# Patient Record
Sex: Female | Born: 1955
Health system: Southern US, Community
[De-identification: ages and names within clinical notes are randomized; demographics above are authoritative.]

## PROBLEM LIST (undated history)

## (undated) DIAGNOSIS — E7800 Pure hypercholesterolemia, unspecified: Secondary | ICD-10-CM

## (undated) DIAGNOSIS — M797 Fibromyalgia: Secondary | ICD-10-CM

## (undated) DIAGNOSIS — I2699 Other pulmonary embolism without acute cor pulmonale: Secondary | ICD-10-CM

## (undated) DIAGNOSIS — K219 Gastro-esophageal reflux disease without esophagitis: Secondary | ICD-10-CM

## (undated) DIAGNOSIS — M1990 Unspecified osteoarthritis, unspecified site: Secondary | ICD-10-CM

## (undated) DIAGNOSIS — I219 Acute myocardial infarction, unspecified: Secondary | ICD-10-CM

## (undated) DIAGNOSIS — F32A Depression, unspecified: Secondary | ICD-10-CM

## (undated) DIAGNOSIS — I428 Other cardiomyopathies: Secondary | ICD-10-CM

## (undated) DIAGNOSIS — I509 Heart failure, unspecified: Secondary | ICD-10-CM

## (undated) DIAGNOSIS — R0600 Dyspnea, unspecified: Secondary | ICD-10-CM

## (undated) DIAGNOSIS — K589 Irritable bowel syndrome without diarrhea: Secondary | ICD-10-CM

## (undated) DIAGNOSIS — G4730 Sleep apnea, unspecified: Secondary | ICD-10-CM

## (undated) DIAGNOSIS — I10 Essential (primary) hypertension: Secondary | ICD-10-CM

## (undated) HISTORY — PX: CATARACT EXTRACTION, BILATERAL: SHX1313

## (undated) HISTORY — DX: Irritable bowel syndrome, unspecified: K58.9

## (undated) HISTORY — DX: Gastro-esophageal reflux disease without esophagitis: K21.9

## (undated) HISTORY — PX: FRACTURE SURGERY: SHX138

## (undated) HISTORY — PX: TUBAL LIGATION: SHX77

## (undated) HISTORY — PX: BUNIONECTOMY: SHX129

## (undated) HISTORY — DX: Sleep apnea, unspecified: G47.30

## (undated) HISTORY — DX: Fibromyalgia: M79.7

## (undated) HISTORY — DX: Essential (primary) hypertension: I10

## (undated) HISTORY — DX: Heart failure, unspecified: I50.9

## (undated) HISTORY — PX: DILATION AND CURETTAGE OF UTERUS: SHX78

## (undated) HISTORY — DX: Pure hypercholesterolemia, unspecified: E78.00

## (undated) HISTORY — DX: Other cardiomyopathies: I42.8

---

## 1998-10-18 HISTORY — PX: ANKLE FRACTURE SURGERY: SHX122

## 2002-02-19 ENCOUNTER — Other Ambulatory Visit: Admission: RE | Admit: 2002-02-19 | Discharge: 2002-02-19 | Payer: Self-pay

## 2004-04-08 ENCOUNTER — Inpatient Hospital Stay (HOSPITAL_BASED_OUTPATIENT_CLINIC_OR_DEPARTMENT_OTHER): Admission: RE | Admit: 2004-04-08 | Discharge: 2004-04-08 | Payer: Self-pay | Admitting: Cardiology

## 2004-12-29 ENCOUNTER — Ambulatory Visit: Payer: Self-pay | Admitting: Cardiology

## 2005-01-11 ENCOUNTER — Ambulatory Visit: Payer: Self-pay | Admitting: Cardiology

## 2005-03-18 HISTORY — PX: CARDIAC CATHETERIZATION: SHX172

## 2006-09-20 ENCOUNTER — Ambulatory Visit: Payer: Self-pay | Admitting: Cardiology

## 2006-09-23 ENCOUNTER — Ambulatory Visit: Payer: Self-pay | Admitting: Cardiology

## 2006-11-04 ENCOUNTER — Ambulatory Visit: Payer: Self-pay | Admitting: Cardiology

## 2008-02-28 ENCOUNTER — Ambulatory Visit: Payer: Self-pay | Admitting: Cardiology

## 2009-02-27 ENCOUNTER — Ambulatory Visit: Payer: Self-pay | Admitting: Cardiology

## 2011-03-02 NOTE — Assessment & Plan Note (Signed)
[facility]                          [facility]   Jennifer, [name] Jennifer [name]                       Jennifer:          [name]  DATE:02/27/2009                            DOB:          1956-04-09    REFERRING PHYSICIAN:  Kirstie Peri, MD   HISTORY OF PRESENT ILLNESS:  The patient is a 55 year old female with a  history of nonischemic cardiomyopathy.  The patient because of financial  reasons did not follow up every year.  She has also been unable to  afford any repeat echocardiogram.  Last ejection fraction was several  years ago and estimated at 40-45% by echo.  Fortunately, she reports no  symptoms of heart failure.  She has not had hospitalizations for CHF.  The patient has also been diagnosed with fibromyalgia.  However, in  talking extensively with the patient, she clearly has symptoms of an  anxiety disorder.  She states that she is irritable at times.  She has  difficulty sitting still.  She wants to be in motion all the time.  She also has ruminating thoughts at night which prevents her from  sleeping and has significant problems with insomnia.  At times, she  breaks out in sweat and it is associated with a tightness in her chest.  She has, however, no exertional chest pain or exertional shortness of  breath.   MEDICATIONS:  1. Aspirin 81 mg p.o. daily.  2. Vitamin B12.  3. Neurontin 300 mg p.o. t.i.d.  4. Calcium.  5. Vitamin E.  6. Vitamin D.  7. Quinapril 10 mg p.o. daily.  8. Hydrochlorothiazide 25 mg p.o. daily.  9. Coreg 3.125 mg p.o. b.i.d.   PHYSICAL EXAMINATION:  VITAL SIGNS:  Blood pressure 123/80, heart rate  71, weight 199 pounds.  GENERAL:  Well-nourished white female in no apparent distress.  HEENT:  Pupils isocoric.  Conjunctivae clear.  NECK:  Supple.  Normal carotid stroke and no carotid bruits.  LUNGS:  Clear breath sounds bilaterally.  HEART:  Regular rate and rhythm.  Normal S1 and S2.  No murmur, rubs,  or  gallops.  ABDOMEN:  Soft, nontender.  No rebound or guarding.  Good bowel sounds.  EXTREMITIES:  No cyanosis, clubbing, or edema.   PROBLEM LIST:  1. Nonischemic cardiomyopathy.  Last ejection fraction in 2008, 40-      45%.      a.     Angiographically normal coronary arteries.      b.     New York Heart Association class I.  2. History of tobacco use, quit 10-12 years ago.  3. Dyslipidemia.  4. Myalgias and generalized weakness possibly secondary to      fibromyalgia.  5. Generalized anxiety disorder.   PLAN:  1. From a cardiac standpoint, the patient is quite stable.  I do not      think we need to uptitrate her medications.  I recommended to her      to do an echocardiographic study to reassess her EF, but      unfortunately she cannot afford this.  2. It  is clear that the patient has a generalized anxiety disorder      which explains a lot of her symptoms and she should talk to Dr.      Sherryll Burger about considering an SSRI or an SNRI for long-term treatment.      From a cardiac standpoint, the patient can be followed up in 1      year.     Learta Codding, MD,FACC  Electronically Signed    GED/MedQ  DD: 02/27/2009  DT: 02/27/2009  Job #: [id number]   cc:   Kirstie Peri, MD

## 2011-03-05 NOTE — Assessment & Plan Note (Signed)
[facility]                          [facility] NOTE   [name], [name]                       MRN:          [id number]  DATE:09/20/2006                            DOB:          06-19-1956    PRIMARY CARDIOLOGIST:  Jonelle Sidle, MD   REASON FOR OFFICE VISIT:  Scheduled annual follow-up.   Jennifer [name] is a pleasant 55 year old female with a history of  nonischemic cardiomyopathy, initially referred to [place] in June 2005, at  which time she presented with recent abnormal stress Cardiolite and  echocardiogram.  She was referred for a cardiac catheterization  revealing angiographically normal coronary arteries with ejection  fraction of 35-40% with global hypokinesis.   The patient has done well on medical therapy, and a follow-up  echocardiogram in March 2006 showed normalization of ejection fraction  (55%) with no focal wall motion abnormality nor significant valvular  abnormalities.   From a clinical standpoint, however, the patient continues to report  significant fatigue which she feels has worsened since her last visit  here 1-1/2 years ago.  Of note, her weight is also up 16 pounds.   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  She reports  having had a formal sleep study last week (results pending) to exclude  sleep apnea.   Although the patient has occasional chest pain, this appears to be  atypical with no strict correlation with exertion.  She also has  persistent cramps, which appear to be generalized and not just relegated  to the legs.  She denies any significant lower extremity edema or  paroxysmal nocturnal dyspnea.   Of note, the patient has undergone several surgeries for removal of  bunions of her feet.  She had been complaining of significant burning of  her feet for  quite some time.  It is not clear if this has helped her,  however.  She also reports having been evaluated by Dr. Kellie Simmering, who  excluded arthritis as a possible explanation for her complaint of  stiffness of the hands.   CURRENT MEDICATIONS:  1. Aspirin 81 mg daily.  2. Aldactone 25 mg daily.  3. Coreg 3.125 mg b.i.d.  4. Quinapril 5 mg daily.  5. Neurontin 300 mg b.i.d.   PHYSICAL EXAMINATION:  VITAL SIGNS:  Blood pressure 128/90, pulse 69,  regular.  Weight 202 (up 16 pounds).  GENERAL:  A 55 year old female, obese, sitting upright in no overt  distress.  NECK:  Palpable carotid pulse without bruits; no JVD.  LUNGS:  Clear to auscultation in all fields.  HEART:  Regular rate and rhythm (S1, S2).  No significant murmurs.  No  gallops.  ABDOMEN:  Protuberant, nontender.  EXTREMITIES:  Palpable pulse without edema.  NEUROLOGIC:  No focal deficits.   IMPRESSION:  1. Persistent fatigue.  2. Nonischemic cardiomyopathy.      a.     Normalization of left ventricular function with ejection  fraction 55% by echocardiogram March 2006.      b.     Angiographically normal coronary arteries/ejection fraction       of 35-45% with global hypokinesis by cardiac catheterization June       2005.      c.     False positive stress Cardiolite (pre-catheterization).  3. History of hypotension.      a.     Improved with down-titration of ACE inhibitor.  4. History of congestive heart failure.      a.     February 2005.  5. History of tobacco.  6. History of dyslipidemia.   PLAN:  It is not clear if Jennifer [name]'s persistent fatigue is related to  her nonischemic cardiomyopathy.  Nevertheless, so as to reassure her, I  have recommended repeating an echocardiogram since it is 1-1/2 years out  to ensure that she continues to have preserved left ventricular  function.  Additionally, we will check complete blood work with a CMET,  a CBC, and a TSH level.  We will otherwise continue her on the  current  medication regimen and have her return to the clinic to follow up with  myself and Dr. Diona Browner in one month for review of test results,  including recommendations.      Jennifer [name], Jennifer [name]  Electronically Signed      Learta Codding, MD,FACC  Electronically Signed   GS/MedQ  DD: 09/20/2006  DT: 09/21/2006  Job #: [id number]   cc:   Kirstie Peri, MD

## 2011-03-05 NOTE — Assessment & Plan Note (Signed)
[facility]                          [facility]   Jennifer [name], [name] Jennifer [name] [name]                       MRN:          [id number]  DATE:11/04/2006                            DOB:          [birth year]    HISTORY OF PRESENT ILLNESS:  The patient is a 55 year old female with a  history of nonischemic cardiomyopathy.  The patient has been doing well  from a heart failure perspective.  Denies any substernal chest pressure,  shortness of breath, orthopnea, or PND.  She does report very rare  episodes of palpitations which occur after heavy exercise.  Her main  complaints, however, are myalgias and weakness for which she is  consulting neurology.   MEDICATIONS:  1. Aspirin 81 mg a day.  2. Aldactone 25 mg p.o. every day.  3. Coreg 3.125 b.i.d.  4. Quinapril/hydrochlorothiazide 10 mg, half tablet p.o. every day.  5. Neurontin 300 mg p.o. b.i.d.  6. Vitamin B12.   PHYSICAL EXAMINATION:  VITAL SIGNS:  Blood pressure 150/96, heart rate  68, weight is 201 pounds.  NECK:  Normal carotid upstroke and no carotid bruits.  LUNGS:  Clear breath sounds bilaterally.  HEART:  Regular rate and rhythm with normal S1 S2 and no murmurs, rubs,  or gallops.  ABDOMEN:  Soft, nontender.  No rebound guarding  .  Good bowel sounds.  EXTREMITIES:  No cyanosis, clubbing, or edema.   PROBLEM LIST:  1. Nonischemic cardiomyopathy.  Ejection fraction 40-45%.      a.     Angiographically normal coronary arteries.      b.     No heart failure symptoms, NYHA Class I.  2. History of tobacco use.  3. Dyslipidemia.  4. Myalgias and generalized weakness.   PLAN:  1. From a cardiac perspective, the patient appears to be doing well.      We made no change in her medical regimen.  Her ejection fraction by      echo appears to be stable.  2. The patient will need to follow up with neurology regarding her      myalgias.  They do not appear to be related to any of her cardiac  medications.     Learta Codding, MD,FACC  Electronically Signed   GED/MedQ  DD: 11/04/2006  DT: 11/04/2006  Job #: [id number]

## 2011-03-05 NOTE — Cardiovascular Report (Signed)
NAMEMarland Kitchen  KARSEN, FELLOWS                          ACCOUNT NO.:  [id number]   MEDICAL RECORD NO.:  [id number]                   PATIENT TYPE:  OIB   LOCATION:  6501                                 FACILITY:  [facility]   PHYSICIAN:  Charlies Constable, M.D. 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  She was  hospitalized in February 2005 with shortness of breath and signs and  symptoms of congestive heart failure.  At that time she had an  echocardiogram which showed an ejection fraction of 45%.  She subsequently  had a Cardiolite scan which suggested anterior ischemia and showed an  ejection fraction of 36% and she was seen in consultation by Dr. Diona Browner  who scheduled her for evaluation with catheterization.   CURRENT MEDICATIONS:  1. Lasix.  2. Accupril.  3. Aspirin.   PROCEDURE:  The procedure was performed via the right femoral artery using  arterial sheath and 4 French preformed coronary catheters. A front wall  arterial puncture was performed and Omnipaque contrast was used.  She  tolerated the procedure well and left the laboratory in satisfactory  condition.   RESULTS:  Left main coronary artery:  The left main coronary was free of  significant disease.   Left anterior descending artery:  The left anterior descending artery gave  rise to two diagonal branches and three septal perforators.  These and the  LAD proper were free of significant disease.   Circumflex artery:  The circumflex artery was a dominant vessel and gave  rise to a marginal branch, posterior lateral branch and a posterior  descending branch.  These vessels were free of significant disease.   Right coronary artery:  The right coronary artery was a nondominant vessel  that supplied two right ventricular branches.  There was  initially some  dampening of the catheter and spasm at the position of the catheter tip and  this resolved after intracoronary nitroglycerin.   LEFT VENTRICULOGRAM:  The left ventriculogram performed in the RAO  projection showed global hypokinesis with an estimated ejection fraction of  35-40%.   ADDENDUM:  The aortic pressure was 113/64 with mean of 85, left ventricular  pressure was 113/35.   CONCLUSION:  1. Normal coronary angiography.  2. Left ventricular dysfunction with global hypokinesis and an estimated     ejection fraction of 35-40%.   RECOMMENDATIONS:  The patient has a nonischemic cardiomyopathy of uncertain  etiology.  She is currently on Lasix and Accupril and I will discuss adding  beta blocker.  Will arrange followup with Dr. Diona Browner in the next couple of  weeks and then followup with Dr. Sherryll Burger.  Charlies Constable, M.D. [facility]    BB/MEDQ  D:  04/08/2004  T:  04/09/2004  Job:  [id number]   cc:   Kirstie Peri, MD  [address]  [place] [address]  Fax: [phone]   Jonelle Sidle, M.D. [facility]

## 2011-07-19 HISTORY — PX: COLONOSCOPY: SHX174

## 2013-10-15 DIAGNOSIS — Z0000 Encounter for general adult medical examination without abnormal findings: Secondary | ICD-10-CM

## 2015-07-03 DIAGNOSIS — Z0000 Encounter for general adult medical examination without abnormal findings: Secondary | ICD-10-CM

## 2015-12-12 DIAGNOSIS — L821 Other seborrheic keratosis: Secondary | ICD-10-CM

## 2017-01-26 MED ORDER — LORAZEPAM 1 MG PO TABS
1 mg | ORAL_TABLET | Freq: Every evening | ORAL | 1 refills
Start: 2017-01-26 — End: 2017-05-05

## 2017-02-25 ENCOUNTER — Telehealth: Payer: PRIVATE HEALTH INSURANCE

## 2017-05-04 MED ORDER — LORAZEPAM 1 MG PO TABS
1 mg | ORAL_TABLET | Freq: Every evening | ORAL | 1 refills | Status: AC
Start: 2017-05-04 — End: 2019-01-02

## 2017-07-20 ENCOUNTER — Ambulatory Visit: Payer: PRIVATE HEALTH INSURANCE

## 2017-11-16 ENCOUNTER — Inpatient Hospital Stay: Payer: PRIVATE HEALTH INSURANCE

## 2017-11-16 DIAGNOSIS — Z0000 Encounter for general adult medical examination without abnormal findings: Secondary | ICD-10-CM

## 2017-12-26 ENCOUNTER — Ambulatory Visit: Payer: PRIVATE HEALTH INSURANCE

## 2017-12-26 DIAGNOSIS — G4700 Insomnia, unspecified: Secondary | ICD-10-CM

## 2017-12-26 DIAGNOSIS — E782 Mixed hyperlipidemia: Secondary | ICD-10-CM

## 2017-12-26 LAB — CBC: HEMATOCRIT: 42.9 (ref 34.9–45.2)

## 2017-12-26 LAB — Differential Automated: BASOPHIL PERCENT, AUTO: 1 (ref 0.00–0.50)

## 2017-12-26 NOTE — H&P
PATIENT:  Kerry Baxter   MRN:  [id number]  DOB:  [birth year]  DATE OF SERVICE:  12/26/2017    PRIMARY CARE PROVIDER: Gregary Cromer., MD    CC:   Chief Complaint   Patient presents with   ??? Annual Exam        Subjective:     HPI: Kerry Baxter is a 62 y.o. female presenting today for     [facility] WELLNESS VISIT/ REVIEW OF ALL CHRONIC MEDICAL CONDITIONS      No additional contributory past medical, surgical, family or social history except as documented in care connect.   ROS: 14-Review of Systems - negative except for nasal congestion    Allergies   Allergen Reactions   ??? Garlic    ??? Sulfa Antibiotics      Rash         Medications:  Outpatient Medications Prior to Visit   Medication Sig   ??? fluticasone 50 mcg/act nasal spray Spray 2 sprays in each nare daily.   ??? lorazepam 1 mg tablet Take 1 tablet (1 mg total) by mouth at bedtime. Max Daily Amount: 1 mg (Patient not taking: Reported on 12/26/2017.)     No facility-administered medications prior to visit.          Past Medical History:  Past Medical History:   Diagnosis Date   ??? Hyperlipidemia 11/28/2012   ??? Insomnia      Patient Active Problem List   Diagnosis   ??? Hyperlipidemia   ??? Insomnia   ??? Routine general medical examination at a health care facility   ??? Preventative health care   ??? SK (seborrheic keratosis)         Past Surgical History:  Past Surgical History:   Procedure Laterality Date   ??? COLONOSCOPY  01/07/2012    Procedure: COLONOSCOPY;  Surgeon: Joslyn Devon, MD;  Location: [facility];  Service: Gastroenterology;  Laterality: N/A;  scope C#11   ??? UPPER GASTROINTESTINAL ENDOSCOPY  01/07/2012    Procedure: UPPER GI ENDOSCOPY;  Surgeon: Joslyn Devon, MD;  Location: [facility];  Service: Gastroenterology;  Laterality: N/A;  Scope E# 6       Social History:  Social History     Social History   ??? Marital status: Married     Spouse name: Kerry Baxter   ??? Number of children: 1   ??? Years of education: masters     Occupational History   ??? Scientist, physiological Social History Main Topics   ??? Smoking status: Never Smoker   ??? Smokeless tobacco: Never Used   ??? Alcohol use 4.8 - 6.0 oz/week     8 - 10 Glasses of Wine (5 oz) per week      Comment: mild   ??? Drug use: No   ??? Sexual activity: Yes     Partners: Male     Birth control/ protection: None     Other Topics Concern   ??? Do You Exercise At Least A Day, 3 Or More Days A Week? Yes   ??? Types Of Exercise? (List In Comments) Yes     gym   ??? Do You Follow A Special Diet? No     Social History Narrative    Diet; Balanced       Family History:   Family History   Problem Relation Age of Onset   ??? Skin cancer Mother    ??? Heart disease Mother  VALVE   ??? Arthritis Sister    ??? Diabetes Other         Family Hx   ??? Mental illness Other         Family Hx   ??? Asthma Other         Family Hx   ??? Allergies Other         Family Hx       Health Maintenance:   Vaccines:   Immunization History   Administered Date(s) Administered   ??? Td 10/18/2006   ??? Tdap 10/15/2013   ??? influenza vaccine IM quadrivalent (Fluzone Quad) (PF) SYR/SDV (61 years of age and older) 08/13/2014, 07/03/2015   ??? influenza vaccine IM quadrivalent (Fluzone Quad) MDV (76 months of age and older) 07/06/2016   ??? influenza, unspecified formulation 07/07/2011, 07/19/2013, 06/18/2017, 07/13/2017   ??? pneumococcal conjugate vaccine 13-valent (Prevnar) 10/31/2014, 12/15/2016     Tdap: +  Pneumovax: + prevnar today  Zostervax: _  Hepatitis: +  Colon Cancer Screen: +  Prostate Cancer Screen: -  Abdominal US: -  PAP: +  Mammogram: +  Bone Density: +  Eye Exam: +  Dental Exam: +  Flu Shot:+      Physical Exam:     Objective:     Vitals:    Last Recorded Vital Signs:    12/26/17 1032   BP: 116/75   Pulse: 58   Resp: 16   Temp: 36.4 ???C (97.5 ???F)   SpO2: 100%     Body mass index is 22.92 kg/m???.  Vitals:    12/26/17 1032   Weight: 142 lb (64.4 kg)   Height: 5' 6'' (1.676 m)      BP 116/75 (BP Location: Left arm, Patient Position: Sitting, Cuff Size: Regular)  ~ Pulse 58  ~ Temp 36.4 ???C (97.5 ???F) (Oral)  ~ Resp 16  ~ Ht 5' 6'' (1.676 m)  ~ Wt 142 lb (64.4 kg)  ~ SpO2 100%  ~ BMI 22.92 kg/m???   General appearance: alert, appears stated age and cooperative  Head: Normocephalic, without obvious abnormality, atraumatic  Eyes: conjunctivae/corneas clear. PERRL, EOM's intact. Fundi benign.  Ears: normal TM's and external ear canals both ears  Nose: Nares normal. Septum midline. Mucosa normal. No drainage or sinus tenderness.  Neck: no adenopathy, no carotid bruit, no JVD, supple, symmetrical, trachea midline and thyroid not enlarged, symmetric, no tenderness/mass/nodules  Back: symmetric, no curvature. ROM normal. No CVA tenderness.  Lungs: clear to auscultation bilaterally  Breasts: normal appearance, no masses or tenderness  Heart: regular rate and rhythm, S1, S2 normal, no murmur, click, rub or gallop  Abdomen: soft, non-tender; bowel sounds normal; no masses,  no organomegaly  Pelvic: deferred  Extremities: extremities normal, atraumatic, no cyanosis or edema  Pulses: 2+ and symmetric  Skin: Skin color, texture, turgor normal.  Multiple SEBORRHEIC KERATOSIS    Lymph nodes: cervical, supraclavicular, and axillary nodes normal.  Neurologic: Grossly normal          Lab/Studies:  DRAWN   MA UNABLE TO DRAW BLOOD, NECESSITATING PHYSICIAN DRAW            Assessment & Plan:   Diagnoses and all orders for this visit:    Routine general medical examination at a health care facility  -     pneumococcal conjugate vaccine 13-valent (Prevnar) IM  -     Lipid Panel; Future  -     TSH with reflex FT4, FT3; Future  -  Vitamin D,25-Hydroxy; Future  -     Comprehensive Metabolic Panel; Future  -     CBC & Auto Differential; Future  -     Fecal Occult Blood Immunoassay; Future  -     POCT urinalysis dipstick  -     ECG 12 lead  -        Mixed hyperlipidemia  -     Lipid Panel; Future    Insomnia, unspecified type   Stable off meds    SK (seborrheic keratosis)   CRYO >20 Laboratory will be reviewed and communicated to patient.      The below was during visit.     EKG     I have independently reviewed and interpreted the above.   The records are uploaded or scanned into care connect in the patients personal file.      30 min was spent with @NAMEBYAGE @ including over 50% FTF for non preventative related care, counseling, and problem management.       The above plan of care, diagnosis, orders, and follow-up were discussed with the patient.  Questions related to this recommended plan of care were answered.      Author:  Simon Rhein. Lajean Silvius, MD 12/26/2017 11:16 AM

## 2017-12-27 LAB — Comprehensive Metabolic Panel
ANION GAP: 12 U/L (ref 8–19)
UREA NITROGEN: 20 mg/dL (ref 7–22)

## 2017-12-27 LAB — Lipid Panel: CHOLESTEROL: 252 mg/dL (ref >50–<100)

## 2017-12-27 LAB — TSH with reflex FT4, FT3: TSH: 1.3 u[IU]/mL (ref 0.3–4.7)

## 2017-12-27 LAB — Vitamin D,25-Hydroxy: VITAMIN D,25-HYDROXY: 33 ng/mL (ref 20–50)

## 2018-08-21 ENCOUNTER — Encounter: Payer: Self-pay | Admitting: Cardiovascular Disease

## 2018-08-21 DIAGNOSIS — Z299 Encounter for prophylactic measures, unspecified: Secondary | ICD-10-CM | POA: Diagnosis not present

## 2018-08-21 DIAGNOSIS — I10 Essential (primary) hypertension: Secondary | ICD-10-CM | POA: Diagnosis not present

## 2018-08-21 DIAGNOSIS — Z6832 Body mass index (BMI) 32.0-32.9, adult: Secondary | ICD-10-CM | POA: Diagnosis not present

## 2018-08-21 DIAGNOSIS — R079 Chest pain, unspecified: Secondary | ICD-10-CM | POA: Diagnosis not present

## 2018-08-21 DIAGNOSIS — Z2821 Immunization not carried out because of patient refusal: Secondary | ICD-10-CM | POA: Diagnosis not present

## 2018-08-23 ENCOUNTER — Encounter: Payer: Self-pay | Admitting: *Deleted

## 2018-08-24 ENCOUNTER — Telehealth: Payer: Self-pay | Admitting: Cardiovascular Disease

## 2018-08-24 ENCOUNTER — Ambulatory Visit: Payer: Medicare PPO | Admitting: Cardiovascular Disease

## 2018-08-24 ENCOUNTER — Encounter: Payer: Self-pay | Admitting: *Deleted

## 2018-08-24 ENCOUNTER — Encounter: Payer: Self-pay | Admitting: Cardiovascular Disease

## 2018-08-24 VITALS — BP 98/62 | HR 74 | Ht 62.0 in | Wt 181.0 lb

## 2018-08-24 DIAGNOSIS — I10 Essential (primary) hypertension: Secondary | ICD-10-CM | POA: Diagnosis not present

## 2018-08-24 DIAGNOSIS — R079 Chest pain, unspecified: Secondary | ICD-10-CM | POA: Diagnosis not present

## 2018-08-24 DIAGNOSIS — I428 Other cardiomyopathies: Secondary | ICD-10-CM | POA: Diagnosis not present

## 2018-08-24 NOTE — Telephone Encounter (Signed)
Pre-cert Verification for the following procedure   Exercise Myoview scheduled for 09-01-2018 at [facility] Echo scheduled for 09-07-18 at [facility]

## 2018-08-24 NOTE — Progress Notes (Signed)
CARDIOLOGY CONSULT NOTE  Patient ID: Jennifer [name] MRN: [id number] DOB/AGE: [birth year] 62 y.o.  Admit date: (Not on file) Primary Physician: Monico Blitz, MD Referring Physician: Monico Blitz, MD  Reason for Consultation: Chest pain  HPI: Jennifer [name] is a 62 y.o. female who is being seen today for the evaluation of chest pain at the request of Monico Blitz, MD.   Past medical history includes nonischemic cardiomyopathy with a prior reported history of 40 to 45%.  She also has a history of fibromyalgia, generalized anxiety disorder, and hypertension.  I reviewed notes from her PCP.  She reportedly has a history of exertional chest pain which started about 6 months ago.  I personally reviewed an ECG performed on 08/21/2018 which demonstrated sinus rhythm with isolated PACs with no significant ST segment or T wave abnormalities, nor any arrhythmias.  She is here with her daughter who moved with her husband from the [place] area within the past year as the patient's husband passed away.  Patient says she has been experiencing exertional retrosternal chest pain over the past 6 months.  She describes it as a dull ache.  It is sometimes associated with nausea, lightheadedness, and dizziness.  Her legs feel weak and then she sits down and symptoms eventually subside.  She denies palpitations, orthopnea, paroxysmal nocturnal dyspnea, and lower extremity edema.  She [place] able [place] and [place] but has to stop when she develops chest pain.  She is also bothered by fibromyalgia which has affected her hips.  Social history: Daughter was an Investment banker, operational in the [employer] area.  The daughter's husband works in Engineer, technical sales.   Allergies  Allergen Reactions  . Poison Oak Extract     Current Outpatient Medications  Medication Sig Dispense Refill  . aspirin EC 81 MG tablet Take 81 mg by mouth daily.    . carvedilol (COREG) 3.125 MG tablet Take 3.125 mg  by mouth 2 (two) times daily with a meal.    . cholecalciferol (VITAMIN D3) 25 MCG (1000 UT) tablet Take 1,000 Units by mouth daily.    . diclofenac (VOLTAREN) 75 MG EC tablet Take 75 mg by mouth 2 (two) times daily.    . DULoxetine (CYMBALTA) 60 MG capsule Take 60 mg by mouth 2 (two) times daily.    Marland Kitchen gabapentin (NEURONTIN) 300 MG capsule Take 600 mg by mouth 3 (three) times daily.    . hydrochlorothiazide (HYDRODIURIL) 25 MG tablet Take 25 mg by mouth daily.    . nitroGLYCERIN (NITROSTAT) 0.4 MG SL tablet Place 0.4 mg under the tongue every 5 (five) minutes x 3 doses as needed for chest pain (if no relief after 3rd dose, proceed to the ED for an evaluation).    . quinapril (ACCUPRIL) 10 MG tablet Take 10 mg by mouth daily.    . vitamin C (ASCORBIC ACID) 500 MG tablet Take 500 mg by mouth daily.     No current facility-administered medications for this visit.     Past Medical History:  Diagnosis Date  . Congestive heart failure (CHF) ([place])   . Fibromyalgia   . GERD (gastroesophageal reflux disease)   . Hypercholesteremia   . Hypertension   . IBS (irritable bowel syndrome)   . Mild sleep apnea    no CPAP  . Nonischemic cardiomyopathy (HCC)    ECHO EF 40-45%, Septal Thickness,and mild global left ventricular dysfunction; stress test 03/2004 +VE, refersible  defect; ECHO 03/2005 @[facility]  EF 55%    Past Surgical History:  Procedure Laterality Date  . CARDIAC CATHETERIZATION  03/2005  . FOOT SURGERY      Social History   Socioeconomic History  . Marital status: Married    Spouse name: Not on file  . Number of children: Not on file  . Years of education: Not on file  . Highest education level: Not on file  Occupational History  . Not on file  Social Needs  . Financial resource strain: Not on file  . Food insecurity:    Worry: Not on file    Inability: Not on file  . Transportation needs:    Medical: Not on file    Non-medical: Not on file  Tobacco Use  . Smoking status:  Never Smoker  . Smokeless tobacco: Never Used  Substance and Sexual Activity  . Alcohol use: Not on file  . Drug use: Not on file  . Sexual activity: Not on file  Lifestyle  . Physical activity:    Days per week: Not on file    Minutes per session: Not on file  . Stress: Not on file  Relationships  . Social connections:    Talks on phone: Not on file    Gets together: Not on file    Attends religious service: Not on file    Active member of club or organization: Not on file    Attends meetings of clubs or organizations: Not on file    Relationship status: Not on file  . Intimate partner violence:    Fear of current or ex partner: Not on file    Emotionally abused: Not on file    Physically abused: Not on file    Forced sexual activity: Not on file  Other Topics Concern  . Not on file  Social History Narrative  . Not on file     No family history of premature CAD in 1st degree relatives.  Current Meds  Medication Sig  . aspirin EC 81 MG tablet Take 81 mg by mouth daily.  . carvedilol (COREG) 3.125 MG tablet Take 3.125 mg by mouth 2 (two) times daily with a meal.  . cholecalciferol (VITAMIN D3) 25 MCG (1000 UT) tablet Take 1,000 Units by mouth daily.  . diclofenac (VOLTAREN) 75 MG EC tablet Take 75 mg by mouth 2 (two) times daily.  . DULoxetine (CYMBALTA) 60 MG capsule Take 60 mg by mouth 2 (two) times daily.  Marland Kitchen gabapentin (NEURONTIN) 300 MG capsule Take 600 mg by mouth 3 (three) times daily.  . hydrochlorothiazide (HYDRODIURIL) 25 MG tablet Take 25 mg by mouth daily.  . nitroGLYCERIN (NITROSTAT) 0.4 MG SL tablet Place 0.4 mg under the tongue every 5 (five) minutes x 3 doses as needed for chest pain (if no relief after 3rd dose, proceed to the ED for an evaluation).  . quinapril (ACCUPRIL) 10 MG tablet Take 10 mg by mouth daily.  . vitamin C (ASCORBIC ACID) 500 MG tablet Take 500 mg by mouth daily.      Review of systems complete and found to be negative unless listed  above in HPI    Physical exam Blood pressure 98/62, pulse 74, height 5\' 2"  (1.575 m), weight 181 lb (82.1 kg), SpO2 98 %. General: NAD Neck: No JVD, no thyromegaly or thyroid nodule.  Lungs: Clear to auscultation bilaterally with normal respiratory effort. CV: Nondisplaced PMI. Regular rate and rhythm, normal S1/S2, no S3/S4, no murmur.  No  peripheral edema.  No carotid bruit.    Abdomen: Soft, nontender, no distention.  Skin: Intact without lesions or rashes.  Neurologic: Alert and oriented x 3.  Psych: Normal affect. Extremities: No clubbing or cyanosis.  HEENT: Normal.   ECG: Most recent ECG reviewed.   Labs: No results found for: K, BUN, CREATININE, ALT, TSH, HGB   Lipids: No results found for: LDLCALC, LDLDIRECT, CHOL, TRIG, HDL      ASSESSMENT AND PLAN:   1.  Chest pain: Symptoms are suspicious for typical angina.  I will obtain an exercise Myoview for further clarification.  If her hips become too painful due to fibromyalgia, this will have to be converted to a Lexiscan.  2.  Nonischemic cardiomyopathy: She remains on carvedilol and quinapril. I will order a 2-D echocardiogram with Doppler to evaluate cardiac structure, function, and regional wall motion.  3.  Hypertension: Controlled on present therapy which includes hydrochlorothiazide, quinapril, and carvedilol.  No changes.   Disposition: Follow up in 2 months  Signed: Kate Sable, M.D., F.A.C.C.  08/24/2018, 1:34 PM

## 2018-08-24 NOTE — Patient Instructions (Signed)
Medication Instructions:  Continue all current medications.  Labwork: none  Testing/Procedures:  Your physician has requested that you have an echocardiogram. Echocardiography is a painless test that uses sound waves to create images of your heart. It provides your doctor with information about the size and shape of your heart and how well your heart's chambers and valves are working. This procedure takes approximately one hour. There are no restrictions for this procedure.  Your physician has requested that you have en exercise stress myoview. For further information please visit HugeFiesta.[email or web address]. Please follow instruction sheet, as given.  Office will contact with results via phone or letter.    Follow-Up: 2 months   Any Other Special Instructions Will Be Listed Below (If Applicable).  If you need a refill on your cardiac medications before your next appointment, please call your pharmacy.

## 2018-09-01 ENCOUNTER — Encounter (HOSPITAL_COMMUNITY): Payer: Self-pay

## 2018-09-01 ENCOUNTER — Encounter (HOSPITAL_COMMUNITY)
Admission: RE | Admit: 2018-09-01 | Discharge: 2018-09-01 | Disposition: A | Discharge status: Home / Self Care | Payer: Medicare PPO | Source: Ambulatory Visit | Attending: Cardiovascular Disease | Admitting: Cardiovascular Disease

## 2018-09-01 ENCOUNTER — Ambulatory Visit (HOSPITAL_COMMUNITY)
Admission: RE | Admit: 2018-09-01 | Discharge: 2018-09-01 | Disposition: A | Discharge status: Home / Self Care | Payer: Medicare PPO | Source: Ambulatory Visit | Attending: Cardiovascular Disease | Admitting: Cardiovascular Disease

## 2018-09-01 DIAGNOSIS — R079 Chest pain, unspecified: Secondary | ICD-10-CM | POA: Diagnosis not present

## 2018-09-01 DIAGNOSIS — I428 Other cardiomyopathies: Secondary | ICD-10-CM | POA: Diagnosis not present

## 2018-09-01 LAB — NM MYOCAR MULTI W/SPECT W/WALL MOTION / EF
CHL CUP MPHR: 158 {beats}/min
CHL CUP NUCLEAR SRS: 9
CHL CUP RESTING HR STRESS: 80 {beats}/min
CHL RATE OF PERCEIVED EXERTION: 9
CSEPED: 3 min
CSEPEDS: 31 s
CSEPEW: 4.6 METS
CSEPHR: 95 %
LV dias vol: 214 mL (ref 46–106)
LV sys vol: 176 mL
Peak HR: 151 {beats}/min
RATE: 0.33
SDS: 4
SSS: 13
TID: 1.16

## 2018-09-01 MED ORDER — TECHNETIUM TC 99M TETROFOSMIN IV KIT
30.0000 | PACK | Freq: Once | INTRAVENOUS | Status: AC | PRN
  Administered 2018-09-01: 31 via INTRAVENOUS

## 2018-09-01 MED ORDER — TECHNETIUM TC 99M TETROFOSMIN IV KIT
10.0000 | PACK | Freq: Once | INTRAVENOUS | Status: AC | PRN
  Administered 2018-09-01: 10.12 via INTRAVENOUS

## 2018-09-01 MED ORDER — SODIUM CHLORIDE 0.9% FLUSH
INTRAVENOUS | Status: AC
  Administered 2018-09-01: 10 mL via INTRAVENOUS
  Filled 2018-09-01: qty 10

## 2018-09-01 MED ORDER — REGADENOSON 0.4 MG/5ML IV SOLN
INTRAVENOUS | Status: AC
  Filled 2018-09-01: qty 5

## 2018-09-06 ENCOUNTER — Telehealth: Payer: Self-pay | Admitting: *Deleted

## 2018-09-06 NOTE — Telephone Encounter (Signed)
-----   Message from Bernita Raisin, RN sent at 09/01/2018  4:25 PM EST ----- Regarding: Ledell Noss pt, will forward   ----- Message ----- From: Herminio Commons, MD Sent: 09/01/2018   3:41 PM EST To: Bernita Raisin, RN  ECG abnormalities were noted.  There appears to be a significant degree of scar tissue with no obvious blockages based on nuclear images.  Cardiac function appeared to be severely reduced. I will await echocardiogram results which will provide an accurate assessment of cardiac structure and function.

## 2018-09-06 NOTE — Telephone Encounter (Signed)
Pt voiced understanding - echo scheduled for tomorrow - routed to pcp

## 2018-09-07 ENCOUNTER — Ambulatory Visit (INDEPENDENT_AMBULATORY_CARE_PROVIDER_SITE_OTHER): Payer: Medicare PPO

## 2018-09-07 ENCOUNTER — Other Ambulatory Visit: Payer: Self-pay

## 2018-09-07 DIAGNOSIS — I428 Other cardiomyopathies: Secondary | ICD-10-CM | POA: Diagnosis not present

## 2018-09-07 DIAGNOSIS — R079 Chest pain, unspecified: Secondary | ICD-10-CM | POA: Diagnosis not present

## 2018-09-12 ENCOUNTER — Telehealth: Payer: Self-pay | Admitting: *Deleted

## 2018-09-12 DIAGNOSIS — Z0000 Encounter for general adult medical examination without abnormal findings: Secondary | ICD-10-CM | POA: Diagnosis not present

## 2018-09-12 DIAGNOSIS — Z1211 Encounter for screening for malignant neoplasm of colon: Secondary | ICD-10-CM | POA: Diagnosis not present

## 2018-09-12 DIAGNOSIS — Z299 Encounter for prophylactic measures, unspecified: Secondary | ICD-10-CM | POA: Diagnosis not present

## 2018-09-12 DIAGNOSIS — Z1331 Encounter for screening for depression: Secondary | ICD-10-CM | POA: Diagnosis not present

## 2018-09-12 DIAGNOSIS — Z7189 Other specified counseling: Secondary | ICD-10-CM | POA: Diagnosis not present

## 2018-09-12 DIAGNOSIS — R5383 Other fatigue: Secondary | ICD-10-CM | POA: Diagnosis not present

## 2018-09-12 DIAGNOSIS — I509 Heart failure, unspecified: Secondary | ICD-10-CM | POA: Diagnosis not present

## 2018-09-12 DIAGNOSIS — Z1339 Encounter for screening examination for other mental health and behavioral disorders: Secondary | ICD-10-CM | POA: Diagnosis not present

## 2018-09-12 DIAGNOSIS — Z6833 Body mass index (BMI) 33.0-33.9, adult: Secondary | ICD-10-CM | POA: Diagnosis not present

## 2018-09-12 NOTE — Telephone Encounter (Signed)
Pt walked into office and voiced understanding of results - confirmed 12/3 appt with Rosaria Ferries, PA

## 2018-09-12 NOTE — Telephone Encounter (Signed)
-----   Message from [place], [place] sent at 09/11/2018  3:21 PM EST ----- Please make sure patient can be seen earlier within 1 week from today to review echo result and myoview. She should not wait until Jan for her followup.

## 2018-09-19 ENCOUNTER — Ambulatory Visit: Payer: Medicare PPO | Admitting: Physician Assistant

## 2018-09-19 ENCOUNTER — Encounter: Payer: Self-pay | Admitting: Physician Assistant

## 2018-09-19 VITALS — BP 110/72 | HR 72 | Ht 62.0 in | Wt 181.0 lb

## 2018-09-19 DIAGNOSIS — I350 Nonrheumatic aortic (valve) stenosis: Secondary | ICD-10-CM

## 2018-09-19 DIAGNOSIS — R9439 Abnormal result of other cardiovascular function study: Secondary | ICD-10-CM | POA: Diagnosis not present

## 2018-09-19 DIAGNOSIS — I255 Ischemic cardiomyopathy: Secondary | ICD-10-CM

## 2018-09-19 DIAGNOSIS — I25110 Atherosclerotic heart disease of native coronary artery with unstable angina pectoris: Secondary | ICD-10-CM | POA: Diagnosis not present

## 2018-09-19 NOTE — Progress Notes (Signed)
Cardiology Office Note   Date:  09/19/2018   ID:  Jennifer [name], DOB Dec 29, 1955, MRN [id number]  PCP:  Monico Blitz, MD Cardiologist:  Kate Sable, MD 08/24/2018 Rosaria Ferries, PA-C   Chief Complaint  Patient presents with  . Results    review.     History of Present Illness: Jennifer [name] is a 62 y.o. female with a history of NICM w/ EF 40-45%, HTN, fibromyalgia, GAD  11/7 office visit, patient had not been seen 04/13/2009.  She was describing exertional chest pain.  ECG was nonacute, Myoview and echo ordered. 11/15 Myoview was abnormal with scar but no ischemia and an EF of 18% 11/21 echo showed an EF of 20-25% and akinesis of the basal mid anteroseptal myocardium, aortic valve area 0.94 cm, mean gradient 5 mmHg, left atrium severely dilated  Jennifer [name] presents for cardiology follow up.   She gets chest pain, aching, with exertion. Associated w/ SOB, will get nausea if she does not stop to rest. Will also get light-headed and leg weakness. Sx will resolve w/ rest in 5-10 minutes. This is consistent w/ exertion.  She has not had resting symptoms.  She recently go nitro, has not had pain bad enough to take a nitro. The worst episode was 8/10.  That happened before she got the nitro.    She has been having the sx for about 6 months. At that time, her husband was ill, he died in 04/14/2023. She did not have insurance after his death until 08/20/23. She is now getting caught up on things.   She does not wake w/ LE edema. No orthopnea or PND.   No palpitations. Rarely gets light-headed or dizzy, this happens with position change, likely vertigo.    Past Medical History:  Diagnosis Date  . Congestive heart failure (CHF) ([place])   . Fibromyalgia   . GERD (gastroesophageal reflux disease)   . Hypercholesteremia   . Hypertension   . IBS (irritable bowel syndrome)   . Mild sleep apnea    no CPAP  . Nonischemic cardiomyopathy ([facility])    ECHO EF 40-45%, Septal  Thickness,and mild global left ventricular dysfunction; stress test 04-13-2004 +VE, refersible defect; ECHO 04/13/05 @[facility]  EF 55%    Past Surgical History:  Procedure Laterality Date  . ANKLE FRACTURE SURGERY Left 2000  . CARDIAC CATHETERIZATION  13-Apr-2005    Current Outpatient Medications  Medication Sig Dispense Refill  . aspirin EC 81 MG tablet Take 81 mg by mouth daily.    . carvedilol (COREG) 3.125 MG tablet Take 3.125 mg by mouth 2 (two) times daily with a meal.    . cholecalciferol (VITAMIN D3) 25 MCG (1000 UT) tablet Take 1,000 Units by mouth daily.    . diclofenac (VOLTAREN) 75 MG EC tablet Take 75 mg by mouth 2 (two) times daily.    . DULoxetine (CYMBALTA) 60 MG capsule Take 60 mg by mouth 2 (two) times daily.    Marland Kitchen gabapentin (NEURONTIN) 300 MG capsule Take 600 mg by mouth 3 (three) times daily.    . hydrochlorothiazide (HYDRODIURIL) 25 MG tablet Take 25 mg by mouth daily.    . nitroGLYCERIN (NITROSTAT) 0.4 MG SL tablet Place 0.4 mg under the tongue every 5 (five) minutes x 3 doses as needed for chest pain (if no relief after 3rd dose, proceed to the ED for an evaluation).    . quinapril (ACCUPRIL) 10 MG tablet Take 10 mg by mouth daily.    Marland Kitchen  vitamin C (ASCORBIC ACID) 500 MG tablet Take 500 mg by mouth daily.     No current facility-administered medications for this visit.     Allergies:   Poison oak extract    Social History:  The patient  reports that she has never smoked. She has never used smokeless tobacco.   Family History:  The patient's family history includes Alcohol abuse in her father; Colon cancer in her father; Congestive Heart Failure (age of onset: 16) in her brother; Depression in her father; Diabetes Mellitus II in her mother; Heart disease (age of onset: 26) in her mother.  She indicated that her mother is deceased. She indicated that her father is deceased. She indicated that her brother is deceased.   ROS:  Please see the history of present illness. All other  systems are reviewed and negative.    PHYSICAL EXAM: VS:  BP 110/72 (BP Location: Left Arm, Patient Position: Sitting)   Pulse 72   Ht 5\' 2"  (1.575 m)   Wt 181 lb (82.1 kg)   SpO2 99%   BMI 33.11 kg/m  , BMI Body mass index is 33.11 kg/m. GEN: Well nourished, well developed, female in no acute distress HEENT: normal for age  Neck: no JVD, no carotid bruit, no masses Cardiac: RRR; soft murmur, no rubs, or gallops Respiratory:  clear to auscultation bilaterally, normal work of breathing GI: soft, nontender, nondistended, + BS MS: no deformity or atrophy; no edema; distal pulses are 2+ in all 4 extremities  Skin: warm and dry, no rash Neuro:  Strength and sensation are intact Psych: euthymic mood, full affect   EKG:  EKG is ordered today. The ekg ordered today demonstrates sinus rhythm, heart rate 78, no acute ischemic changes, no pathologic Q waves  ECHO: 09/07/2018 - Left ventricle: The cavity size was moderately dilated. Wall   thickness was normal. Systolic function was severely reduced. The   estimated ejection fraction was in the range of 20% to 25%. - Regional wall motion abnormality: Akinesis of the basal-mid   anteroseptal myocardium. - Aortic valve: Mildly calcified annulus. Trileaflet; mildly   thickened leaflets. Valve area (VTI): 0.94 cm^2. Valve area   (Vmax): 1.05 cm^2. Valve area (Vmean): 0.92 cm^2. - Mitral valve: Mildly calcified annulus. Mildly thickened leaflets   . There was mild regurgitation. - Left atrium: The atrium was severely dilated. - Atrial septum: No defect or patent foramen ovale was identified.   CATH: 03/2004  CONCLUSION:  1. Normal coronary angiography.  2. Left ventricular dysfunction with global hypokinesis and an estimated     ejection fraction of 35-40%.   RECOMMENDATIONS:  The patient has a nonischemic cardiomyopathy of uncertain  etiology.  She is currently on Lasix and Accupril and I will discuss adding  beta blocker.  Will  arrange followup with Dr. Domenic Polite in the next couple of  weeks and then followup with Dr. Manuella Ghazi.   MYOVIEW: 09/01/2018  Blood pressure demonstrated a normal response to exercise.  Horizontal ST segment depression ST segment depression of 0.5 mm was noted during stress in the II, III, aVF, V5 and V6 leads, and returning to baseline after 1-5 minutes of recovery. There were also inferolateral T wave inversions in recovery which subsequently resolved. PVC's also noted in recovery.  Diminished exercise tolerance with an exaggerated heart rate response.  Defect 1: There is a large defect of moderate severity present in the mid anterior, mid anteroseptal, mid inferoseptal, mid anterolateral, apical anterior, apical septal, apical lateral  and apex location. This appears to be consistent with scar. There are no significant ischemic territories.  This is a high risk study based on degree of myocardial scar and left ventricular dysfunction.  Nuclear stress EF: 18%.      Recent Labs: No results found for requested labs within last 8760 hours.  CBC No results found for: WBC, RBC, HGB, HCT, PLT, MCV, MCH, MCHC, RDW, LYMPHSABS, MONOABS, EOSABS, BASOSABS No flowsheet data found.   Lipid Panel No results found for: CHOL, HDL, LDLCALC, LDLDIRECT, TRIG, CHOLHDL    Wt Readings from Last 3 Encounters:  09/19/18 181 lb (82.1 kg)  08/24/18 181 lb (82.1 kg)     Other studies Reviewed: Additional studies/ records that were reviewed today include: Office notes, hospital records and testing.  ASSESSMENT AND PLAN: The patient and plan were reviewed with Dr. Claiborne Billings, who agrees  1.  Unstable anginal pain, abnormal stress test: She has not had resting symptoms, but she has consistent exertional angina.  In discussing the situation with the patient and her daughter, it seems that the amount of exertion required to bring on symptoms is less than it used to be. - Her stress test and echocardiogram are  significantly abnormal - The risks and benefits of a cardiac catheterization including, but not limited to, death, stroke, MI, kidney damage and bleeding were discussed with the patient who indicates understanding and agrees to proceed.  -We will schedule this at the first available opportunity. -We will go ahead and draw labs today. -She understands that if she gets resting chest pain or her chest pain is not relieved in just a few minutes with rest or nitroglycerin, she is to call 911. - Limit activity as she has been doing and get her family to help her do things around the barn such as feeding horses.  2.  Hypertension: Blood pressures under good control on current medications, no changes  3.  Presumed ischemic cardiomyopathy: She has a history of nonischemic cardiomyopathy but her stress test is significantly abnormal and her EF is decreased from previous - She is to watch the sodium in her diet. -She is tolerating a low dose of a beta-blocker, an ACE inhibitor, and a diuretic. -Volume status is good by exam - After the cardiac catheterization, discuss changing therapy, she would benefit from [place]. -Review with Dr. Bronson Ing, she may benefit from a LifeVest, or consideration of an ICD in 3 months if her EF does not improve  4.  Aortic stenosis: Her valve is small but the gradient is low.  The murmur is not loud. -Do a right heart cath at the time of her left heart cath to further evaluate this -She may have low gradient, low output aortic stenosis and may need further evaluation. - However, her symptoms seem mostly anginal in origin with the shortness of breath always associated with chest pain.  At this time, I do not currently have a strong feeling that her symptoms are coming from the aortic stenosis. -Follow her symptoms once her coronary artery disease is evaluated.    Current medicines are reviewed at length with the patient today.  The patient does not have concerns regarding  medicines.  The following changes have been made:  no change  Labs/ tests ordered today include:  No orders of the defined types were placed in this encounter.    Disposition:   FU with Kate Sable, MD  Signed, Rosaria Ferries, PA-C  09/19/2018 1:31 PM    [facility] Phone: [phone]; Fax: [phone]

## 2018-09-19 NOTE — Patient Instructions (Addendum)
Medication Instructions:  No Changes If you need a refill on your cardiac medications before your next appointment, please call your pharmacy.   Lab work: CBC, BMET If you have labs (blood work) drawn today and your tests are completely normal, you will receive your results only by: Marland Kitchen MyChart Message (if you have MyChart) OR . A paper copy in the mail If you have any lab test that is abnormal or we need to change your treatment, we will call you to review the results.  Testing/Procedures: Your physician has requested that you have a cardiac catheterization. Cardiac catheterization is used to diagnose and/or treat various heart conditions. Doctors may recommend this procedure for a number of different reasons. The most common reason is to evaluate chest pain. Chest pain can be a symptom of coronary artery disease (CAD), and cardiac catheterization can show whether plaque is narrowing or blocking your heart's arteries. This procedure is also used to evaluate the valves, as well as measure the blood flow and oxygen levels in different parts of your heart. For further information please visit HugeFiesta.[email or web address]. Please follow instruction sheet, as given.   Follow-Up: At [facility], you and your health needs are our priority.  As part of our continuing mission to provide you with exceptional heart care, we have created designated Provider Care Teams.  These Care Teams include your primary Cardiologist (physician) and Advanced Practice Providers (APPs -  Physician Assistants and Nurse Practitioners) who all work together to provide you with the care you need, when you need it. You will need a follow up appointment first available.  You may see Kate Sable, MD or one of the following Advanced Practice Providers on your designated Care Team:   Bernerd Pho, PA-C [facility]) . Ermalinda Barrios, PA-C ([place])  Any Other Special Instructions Will Be Listed Below (If  Applicable). None         [address] [place] [address] Dept: [phone] Loc: [phone]  NELI FOFANA  09/19/2018  You are scheduled for a Cardiac Catheterization on Thursday, December 5 with Dr. Shelva Majestic.  1. Please arrive at the [facility] (Main Entrance A) at [facility]: [address] [place], [place] [address] at 5:30 AM (This time is two hours before your procedure to ensure your preparation). Free valet parking service is available.   Special note: Every effort is made to have your procedure done on time. Please understand that emergencies sometimes delay scheduled procedures.  2. Diet: Do not eat solid foods after midnight.  The patient may have clear liquids until 5am upon the day of the procedure.  3. Labs: You will need to have blood drawn today December 3rd, 2019  4. Medication instructions in preparation for your procedure:   Contrast Allergy: No   Stop taking, HTCZ (Hydrochlorothiazide) Thursday, December 5, morning of.      On the morning of your procedure, take your Aspirin and any morning medicines NOT listed above.  You may use sips of water.  5. Plan for one night stay--bring personal belongings. 6. Bring a current list of your medications and current insurance cards. 7. You MUST have a responsible person to drive you home. 8. Someone MUST be with you the first 24 hours after you arrive home or your discharge will be delayed. 9. Please wear clothes that are easy to get on and off and wear slip-on shoes.  Thank  you for allowing [place] to care for you!   -- [facility]

## 2018-09-19 NOTE — H&P (View-Only) (Signed)
Cardiology Office Note   Date:  09/19/2018   ID:  Jennifer [name], DOB 07-01-1956, MRN [id number]  PCP:  Monico Blitz, MD Cardiologist:  Kate Sable, MD 08/24/2018 Jennifer Ferries, PA-C   Chief Complaint  Patient presents with  . Results    review.     History of Present Illness: Jennifer [name] is a 62 y.o. female with a history of NICM w/ EF 40-45%, HTN, fibromyalgia, GAD  11/7 office visit, patient had not been seen 04/21/2009.  She was describing exertional chest pain.  ECG was nonacute, Myoview and echo ordered. 11/15 Myoview was abnormal with scar but no ischemia and an EF of 18% 11/21 echo showed an EF of 20-25% and akinesis of the basal mid anteroseptal myocardium, aortic valve area 0.94 cm, mean gradient 5 mmHg, left atrium severely dilated  Jennifer [name] presents for cardiology follow up.   She gets chest pain, aching, with exertion. Associated w/ SOB, will get nausea if she does not stop to rest. Will also get light-headed and leg weakness. Sx will resolve w/ rest in 5-10 minutes. This is consistent w/ exertion.  She has not had resting symptoms.  She recently go nitro, has not had pain bad enough to take a nitro. The worst episode was 8/10.  That happened before she got the nitro.    She has been having the sx for about 6 months. At that time, her husband was ill, he died in Apr 22, 2023. She did not have insurance after his death until Aug 28, 2023. She is now getting caught up on things.   She does not wake w/ LE edema. No orthopnea or PND.   No palpitations. Rarely gets light-headed or dizzy, this happens with position change, likely vertigo.    Past Medical History:  Diagnosis Date  . Congestive heart failure (CHF) ([place])   . Fibromyalgia   . GERD (gastroesophageal reflux disease)   . Hypercholesteremia   . Hypertension   . IBS (irritable bowel syndrome)   . Mild sleep apnea    no CPAP  . Nonischemic cardiomyopathy (HCC)    ECHO EF 40-45%, Septal  Thickness,and mild global left ventricular dysfunction; stress test 04/21/2004 +VE, refersible defect; ECHO 04/21/05 @[facility]  EF 55%    Past Surgical History:  Procedure Laterality Date  . ANKLE FRACTURE SURGERY Left 2000  . CARDIAC CATHETERIZATION  21-Apr-2005    Current Outpatient Medications  Medication Sig Dispense Refill  . aspirin EC 81 MG tablet Take 81 mg by mouth daily.    . carvedilol (COREG) 3.125 MG tablet Take 3.125 mg by mouth 2 (two) times daily with a meal.    . cholecalciferol (VITAMIN D3) 25 MCG (1000 UT) tablet Take 1,000 Units by mouth daily.    . diclofenac (VOLTAREN) 75 MG EC tablet Take 75 mg by mouth 2 (two) times daily.    . DULoxetine (CYMBALTA) 60 MG capsule Take 60 mg by mouth 2 (two) times daily.    Marland Kitchen gabapentin (NEURONTIN) 300 MG capsule Take 600 mg by mouth 3 (three) times daily.    . hydrochlorothiazide (HYDRODIURIL) 25 MG tablet Take 25 mg by mouth daily.    . nitroGLYCERIN (NITROSTAT) 0.4 MG SL tablet Place 0.4 mg under the tongue every 5 (five) minutes x 3 doses as needed for chest pain (if no relief after 3rd dose, proceed to the ED for an evaluation).    . quinapril (ACCUPRIL) 10 MG tablet Take 10 mg by mouth daily.    Marland Kitchen  vitamin C (ASCORBIC ACID) 500 MG tablet Take 500 mg by mouth daily.     No current facility-administered medications for this visit.     Allergies:   Poison oak extract    Social History:  The patient  reports that she has never smoked. She has never used smokeless tobacco.   Family History:  The patient's family history includes Alcohol abuse in her father; Colon cancer in her father; Congestive Heart Failure (age of onset: 30) in her brother; Depression in her father; Diabetes Mellitus II in her mother; Heart disease (age of onset: 34) in her mother.  She indicated that her mother is deceased. She indicated that her father is deceased. She indicated that her brother is deceased.   ROS:  Please see the history of present illness. All other  systems are reviewed and negative.    PHYSICAL EXAM: VS:  BP 110/72 (BP Location: Left Arm, Patient Position: Sitting)   Pulse 72   Ht 5\' 2"  (1.575 m)   Wt 181 lb (82.1 kg)   SpO2 99%   BMI 33.11 kg/m  , BMI Body mass index is 33.11 kg/m. GEN: Well nourished, well developed, female in no acute distress HEENT: normal for age  Neck: no JVD, no carotid bruit, no masses Cardiac: RRR; soft murmur, no rubs, or gallops Respiratory:  clear to auscultation bilaterally, normal work of breathing GI: soft, nontender, nondistended, + BS MS: no deformity or atrophy; no edema; distal pulses are 2+ in all 4 extremities  Skin: warm and dry, no rash Neuro:  Strength and sensation are intact Psych: euthymic mood, full affect   EKG:  EKG is ordered today. The ekg ordered today demonstrates sinus rhythm, heart rate 78, no acute ischemic changes, no pathologic Q waves  ECHO: 09/07/2018 - Left ventricle: The cavity size was moderately dilated. Wall   thickness was normal. Systolic function was severely reduced. The   estimated ejection fraction was in the range of 20% to 25%. - Regional wall motion abnormality: Akinesis of the basal-mid   anteroseptal myocardium. - Aortic valve: Mildly calcified annulus. Trileaflet; mildly   thickened leaflets. Valve area (VTI): 0.94 cm^2. Valve area   (Vmax): 1.05 cm^2. Valve area (Vmean): 0.92 cm^2. - Mitral valve: Mildly calcified annulus. Mildly thickened leaflets   . There was mild regurgitation. - Left atrium: The atrium was severely dilated. - Atrial septum: No defect or patent foramen ovale was identified.   CATH: 03/2004  CONCLUSION:  1. Normal coronary angiography.  2. Left ventricular dysfunction with global hypokinesis and an estimated     ejection fraction of 35-40%.   RECOMMENDATIONS:  The patient has a nonischemic cardiomyopathy of uncertain  etiology.  She is currently on Lasix and Accupril and I will discuss adding  beta blocker.  Will  arrange followup with Dr. Domenic Polite in the next couple of  weeks and then followup with Dr. Manuella Ghazi.   MYOVIEW: 09/01/2018  Blood pressure demonstrated a normal response to exercise.  Horizontal ST segment depression ST segment depression of 0.5 mm was noted during stress in the II, III, aVF, V5 and V6 leads, and returning to baseline after 1-5 minutes of recovery. There were also inferolateral T wave inversions in recovery which subsequently resolved. PVC's also noted in recovery.  Diminished exercise tolerance with an exaggerated heart rate response.  Defect 1: There is a large defect of moderate severity present in the mid anterior, mid anteroseptal, mid inferoseptal, mid anterolateral, apical anterior, apical septal, apical lateral  and apex location. This appears to be consistent with scar. There are no significant ischemic territories.  This is a high risk study based on degree of myocardial scar and left ventricular dysfunction.  Nuclear stress EF: 18%.      Recent Labs: No results found for requested labs within last 8760 hours.  CBC No results found for: WBC, RBC, HGB, HCT, PLT, MCV, MCH, MCHC, RDW, LYMPHSABS, MONOABS, EOSABS, BASOSABS No flowsheet data found.   Lipid Panel No results found for: CHOL, HDL, LDLCALC, LDLDIRECT, TRIG, CHOLHDL    Wt Readings from Last 3 Encounters:  09/19/18 181 lb (82.1 kg)  08/24/18 181 lb (82.1 kg)     Other studies Reviewed: Additional studies/ records that were reviewed today include: Office notes, hospital records and testing.  ASSESSMENT AND PLAN: The patient and plan were reviewed with Dr. Claiborne Billings, who agrees  1.  Unstable anginal pain, abnormal stress test: She has not had resting symptoms, but she has consistent exertional angina.  In discussing the situation with the patient and her daughter, it seems that the amount of exertion required to bring on symptoms is less than it used to be. - Her stress test and echocardiogram are  significantly abnormal - The risks and benefits of a cardiac catheterization including, but not limited to, death, stroke, MI, kidney damage and bleeding were discussed with the patient who indicates understanding and agrees to proceed.  -We will schedule this at the first available opportunity. -We will go ahead and draw labs today. -She understands that if she gets resting chest pain or her chest pain is not relieved in just a few minutes with rest or nitroglycerin, she is to call 911. - Limit activity as she has been doing and get her family to help her do things around the barn such as feeding horses.  2.  Hypertension: Blood pressures under good control on current medications, no changes  3.  Presumed ischemic cardiomyopathy: She has a history of nonischemic cardiomyopathy but her stress test is significantly abnormal and her EF is decreased from previous - She is to watch the sodium in her diet. -She is tolerating a low dose of a beta-blocker, an ACE inhibitor, and a diuretic. -Volume status is good by exam - After the cardiac catheterization, discuss changing therapy, she would benefit from Burns. -Review with Dr. Bronson Ing, she may benefit from a LifeVest, or consideration of an ICD in 3 months if her EF does not improve  4.  Aortic stenosis: Her valve is small but the gradient is low.  The murmur is not loud. -Do a right heart cath at the time of her left heart cath to further evaluate this -She may have low gradient, low output aortic stenosis and may need further evaluation. - However, her symptoms seem mostly anginal in origin with the shortness of breath always associated with chest pain.  At this time, I do not currently have a strong feeling that her symptoms are coming from the aortic stenosis. -Follow her symptoms once her coronary artery disease is evaluated.    Current medicines are reviewed at length with the patient today.  The patient does not have concerns regarding  medicines.  The following changes have been made:  no change  Labs/ tests ordered today include:  No orders of the defined types were placed in this encounter.    Disposition:   FU with Kate Sable, MD  Signed, Jennifer Ferries, PA-C  09/19/2018 1:31 PM    [facility] Phone: [phone]; Fax: [phone]

## 2018-09-20 DIAGNOSIS — R5383 Other fatigue: Secondary | ICD-10-CM | POA: Diagnosis not present

## 2018-09-20 DIAGNOSIS — Z79899 Other long term (current) drug therapy: Secondary | ICD-10-CM | POA: Diagnosis not present

## 2018-09-20 DIAGNOSIS — E559 Vitamin D deficiency, unspecified: Secondary | ICD-10-CM | POA: Diagnosis not present

## 2018-09-20 DIAGNOSIS — E7800 Pure hypercholesterolemia, unspecified: Secondary | ICD-10-CM | POA: Diagnosis not present

## 2018-09-20 LAB — CBC
Hematocrit: 39.6 % (ref 34.0–46.6)
Hemoglobin: 13.1 g/dL (ref 11.1–15.9)
MCH: 33.1 pg — ABNORMAL HIGH (ref 26.6–33.0)
MCHC: 33.1 g/dL (ref 31.5–35.7)
MCV: 100 fL — ABNORMAL HIGH (ref 79–97)
Platelets: 248 10*3/uL (ref 150–450)
RBC: 3.96 x10E6/uL (ref 3.77–5.28)
RDW: 11.8 % — ABNORMAL LOW (ref 12.3–15.4)
WBC: 6.8 10*3/uL (ref 3.4–10.8)

## 2018-09-20 LAB — BASIC METABOLIC PANEL
BUN / CREAT RATIO: 20 (ref 12–28)
BUN: 20 mg/dL (ref 8–27)
CO2: 18 mmol/L — ABNORMAL LOW (ref 20–29)
CREATININE: 0.99 mg/dL (ref 0.57–1.00)
Calcium: 8.9 mg/dL (ref 8.7–10.3)
Chloride: 105 mmol/L (ref 96–106)
GFR calc Af Amer: 71 mL/min/{1.73_m2} (ref >59)
GFR, EST NON AFRICAN AMERICAN: 61 mL/min/{1.73_m2} (ref >59)
GLUCOSE: 70 mg/dL (ref 65–99)
Potassium: 4.1 mmol/L (ref 3.5–5.2)
Sodium: 143 mmol/L (ref 134–144)

## 2018-09-20 LAB — PROTIME-INR
INR: 1 (ref 0.8–1.2)
Prothrombin Time: 10.3 s (ref 9.1–12.0)

## 2018-09-21 ENCOUNTER — Inpatient Hospital Stay (HOSPITAL_COMMUNITY): Payer: Medicare PPO

## 2018-09-21 ENCOUNTER — Other Ambulatory Visit: Payer: Self-pay | Admitting: *Deleted

## 2018-09-21 ENCOUNTER — Other Ambulatory Visit: Payer: Self-pay

## 2018-09-21 ENCOUNTER — Encounter (HOSPITAL_COMMUNITY): Payer: Self-pay | Admitting: Cardiovascular Disease

## 2018-09-21 ENCOUNTER — Inpatient Hospital Stay (HOSPITAL_COMMUNITY)
Admission: AD | Admit: 2018-09-21 | Discharge: 2018-09-28 | DRG: 234 | Disposition: A | Discharge status: Home / Self Care | Payer: Medicare PPO | Source: Ambulatory Visit | Attending: Thoracic Surgery (Cardiothoracic Vascular Surgery) | Admitting: Thoracic Surgery (Cardiothoracic Vascular Surgery)

## 2018-09-21 ENCOUNTER — Inpatient Hospital Stay (HOSPITAL_COMMUNITY)
Admission: AD | Disposition: A | Discharge status: Home / Self Care | Payer: Self-pay | Source: Ambulatory Visit | Attending: Thoracic Surgery (Cardiothoracic Vascular Surgery)

## 2018-09-21 DIAGNOSIS — I472 Ventricular tachycardia: Secondary | ICD-10-CM | POA: Diagnosis not present

## 2018-09-21 DIAGNOSIS — J811 Chronic pulmonary edema: Secondary | ICD-10-CM | POA: Diagnosis not present

## 2018-09-21 DIAGNOSIS — I2510 Atherosclerotic heart disease of native coronary artery without angina pectoris: Secondary | ICD-10-CM | POA: Diagnosis not present

## 2018-09-21 DIAGNOSIS — Z811 Family history of alcohol abuse and dependence: Secondary | ICD-10-CM

## 2018-09-21 DIAGNOSIS — Z8249 Family history of ischemic heart disease and other diseases of the circulatory system: Secondary | ICD-10-CM

## 2018-09-21 DIAGNOSIS — J9811 Atelectasis: Secondary | ICD-10-CM | POA: Diagnosis not present

## 2018-09-21 DIAGNOSIS — I110 Hypertensive heart disease with heart failure: Secondary | ICD-10-CM | POA: Diagnosis not present

## 2018-09-21 DIAGNOSIS — I25110 Atherosclerotic heart disease of native coronary artery with unstable angina pectoris: Principal | ICD-10-CM

## 2018-09-21 DIAGNOSIS — R0609 Other forms of dyspnea: Secondary | ICD-10-CM

## 2018-09-21 DIAGNOSIS — K219 Gastro-esophageal reflux disease without esophagitis: Secondary | ICD-10-CM | POA: Diagnosis not present

## 2018-09-21 DIAGNOSIS — Z818 Family history of other mental and behavioral disorders: Secondary | ICD-10-CM

## 2018-09-21 DIAGNOSIS — K589 Irritable bowel syndrome without diarrhea: Secondary | ICD-10-CM | POA: Diagnosis present

## 2018-09-21 DIAGNOSIS — G4733 Obstructive sleep apnea (adult) (pediatric): Secondary | ICD-10-CM | POA: Diagnosis present

## 2018-09-21 DIAGNOSIS — I081 Rheumatic disorders of both mitral and tricuspid valves: Secondary | ICD-10-CM | POA: Diagnosis not present

## 2018-09-21 DIAGNOSIS — E669 Obesity, unspecified: Secondary | ICD-10-CM | POA: Diagnosis present

## 2018-09-21 DIAGNOSIS — E785 Hyperlipidemia, unspecified: Secondary | ICD-10-CM | POA: Diagnosis present

## 2018-09-21 DIAGNOSIS — R5381 Other malaise: Secondary | ICD-10-CM | POA: Diagnosis not present

## 2018-09-21 DIAGNOSIS — Z91048 Other nonmedicinal substance allergy status: Secondary | ICD-10-CM | POA: Diagnosis not present

## 2018-09-21 DIAGNOSIS — I5022 Chronic systolic (congestive) heart failure: Secondary | ICD-10-CM | POA: Diagnosis not present

## 2018-09-21 DIAGNOSIS — Z6838 Body mass index (BMI) 38.0-38.9, adult: Secondary | ICD-10-CM

## 2018-09-21 DIAGNOSIS — E7800 Pure hypercholesterolemia, unspecified: Secondary | ICD-10-CM | POA: Diagnosis present

## 2018-09-21 DIAGNOSIS — I509 Heart failure, unspecified: Secondary | ICD-10-CM | POA: Diagnosis not present

## 2018-09-21 DIAGNOSIS — Z01810 Encounter for preprocedural cardiovascular examination: Secondary | ICD-10-CM

## 2018-09-21 DIAGNOSIS — M797 Fibromyalgia: Secondary | ICD-10-CM | POA: Diagnosis present

## 2018-09-21 DIAGNOSIS — J90 Pleural effusion, not elsewhere classified: Secondary | ICD-10-CM | POA: Diagnosis not present

## 2018-09-21 DIAGNOSIS — I252 Old myocardial infarction: Secondary | ICD-10-CM

## 2018-09-21 DIAGNOSIS — I255 Ischemic cardiomyopathy: Secondary | ICD-10-CM | POA: Diagnosis not present

## 2018-09-21 DIAGNOSIS — Z7982 Long term (current) use of aspirin: Secondary | ICD-10-CM

## 2018-09-21 DIAGNOSIS — D696 Thrombocytopenia, unspecified: Secondary | ICD-10-CM | POA: Diagnosis not present

## 2018-09-21 DIAGNOSIS — Z87891 Personal history of nicotine dependence: Secondary | ICD-10-CM

## 2018-09-21 DIAGNOSIS — Z833 Family history of diabetes mellitus: Secondary | ICD-10-CM

## 2018-09-21 DIAGNOSIS — Z79899 Other long term (current) drug therapy: Secondary | ICD-10-CM

## 2018-09-21 DIAGNOSIS — Z951 Presence of aortocoronary bypass graft: Secondary | ICD-10-CM

## 2018-09-21 DIAGNOSIS — D62 Acute posthemorrhagic anemia: Secondary | ICD-10-CM | POA: Diagnosis not present

## 2018-09-21 DIAGNOSIS — I428 Other cardiomyopathies: Secondary | ICD-10-CM | POA: Diagnosis present

## 2018-09-21 DIAGNOSIS — I959 Hypotension, unspecified: Secondary | ICD-10-CM | POA: Diagnosis not present

## 2018-09-21 DIAGNOSIS — I10 Essential (primary) hypertension: Secondary | ICD-10-CM | POA: Diagnosis not present

## 2018-09-21 DIAGNOSIS — I350 Nonrheumatic aortic (valve) stenosis: Secondary | ICD-10-CM

## 2018-09-21 HISTORY — DX: Acute myocardial infarction, unspecified: I21.9

## 2018-09-21 HISTORY — PX: RIGHT/LEFT HEART CATH AND CORONARY ANGIOGRAPHY: CATH118266

## 2018-09-21 HISTORY — DX: Unspecified osteoarthritis, unspecified site: M19.90

## 2018-09-21 LAB — PULMONARY FUNCTION TEST
FEF 25-75 Pre: 0.69 L/sec
FEF2575-%Pred-Pre: 31 %
FEV1-%Pred-Pre: 57 %
FEV1-Pre: 1.32 L
FEV1FVC-%Pred-Pre: 81 %
FEV6-%Pred-Pre: 69 %
FEV6-Pre: 2.01 L
FEV6FVC-%PRED-PRE: 99 %
FVC-%Pred-Pre: 69 %
FVC-PRE: 2.09 L
Pre FEV1/FVC ratio: 63 %
Pre FEV6/FVC Ratio: 96 %

## 2018-09-21 LAB — SURGICAL PCR SCREEN
MRSA, PCR: NEGATIVE
Staphylococcus aureus: NEGATIVE

## 2018-09-21 LAB — POCT I-STAT 3, ART BLOOD GAS (G3+)
Acid-Base Excess: 2 mmol/L (ref 0.0–2.0)
Bicarbonate: 27.7 mmol/L (ref 20.0–28.0)
O2 Saturation: 97 %
TCO2: 29 mmol/L (ref 22–32)
pCO2 arterial: 49.9 mmHg — ABNORMAL HIGH (ref 32.0–48.0)
pH, Arterial: 7.353 (ref 7.350–7.450)
pO2, Arterial: 99 mmHg (ref 83.0–108.0)

## 2018-09-21 LAB — POCT I-STAT 3, VENOUS BLOOD GAS (G3P V)
Bicarbonate: 25.7 mmol/L (ref 20.0–28.0)
O2 Saturation: 63 %
TCO2: 27 mmol/L (ref 22–32)
pCO2, Ven: 46.2 mmHg (ref 44.0–60.0)
pH, Ven: 7.354 (ref 7.250–7.430)
pO2, Ven: 35 mmHg (ref 32.0–45.0)

## 2018-09-21 LAB — ABO/RH: ABO/RH(D): A POS

## 2018-09-21 LAB — URINALYSIS, COMPLETE (UACMP) WITH MICROSCOPIC
Bacteria, UA: NONE SEEN
Bilirubin Urine: NEGATIVE
Glucose, UA: NEGATIVE mg/dL
Ketones, ur: NEGATIVE mg/dL
Leukocytes, UA: NEGATIVE
NITRITE: NEGATIVE
Protein, ur: NEGATIVE mg/dL
SPECIFIC GRAVITY, URINE: 1.029 (ref 1.005–1.030)
pH: 6 (ref 5.0–8.0)

## 2018-09-21 LAB — TYPE AND SCREEN
ABO/RH(D): A POS
Antibody Screen: NEGATIVE

## 2018-09-21 LAB — PROTIME-INR
INR: 0.99
Prothrombin Time: 13 seconds (ref 11.4–15.2)

## 2018-09-21 SURGERY — RIGHT/LEFT HEART CATH AND CORONARY ANGIOGRAPHY
Anesthesia: LOCAL

## 2018-09-21 MED ORDER — SODIUM CHLORIDE 0.9 % IV SOLN: 250.0000 mL | INTRAVENOUS | Status: DC | PRN

## 2018-09-21 MED ORDER — CHLORHEXIDINE GLUCONATE 0.12 % MT SOLN
15.0000 mL | Freq: Once | OROMUCOSAL | Status: AC
  Administered 2018-09-22: 15 mL via OROMUCOSAL
  Filled 2018-09-21: qty 15

## 2018-09-21 MED ORDER — FENTANYL CITRATE (PF) 100 MCG/2ML IJ SOLN
INTRAMUSCULAR | Status: AC
  Filled 2018-09-21: qty 2

## 2018-09-21 MED ORDER — TRANEXAMIC ACID 1000 MG/10ML IV SOLN
1.5000 mg/kg/h | INTRAVENOUS | Status: AC
  Administered 2018-09-22: 1.5 mg/kg/h via INTRAVENOUS
  Filled 2018-09-21: qty 25

## 2018-09-21 MED ORDER — EPINEPHRINE PF 1 MG/ML IJ SOLN
0.0000 ug/min | INTRAVENOUS | Status: DC
  Filled 2018-09-21: qty 4

## 2018-09-21 MED ORDER — LIDOCAINE HCL (PF) 1 % IJ SOLN
INTRAMUSCULAR | Status: AC
  Filled 2018-09-21: qty 30

## 2018-09-21 MED ORDER — POTASSIUM CHLORIDE 2 MEQ/ML IV SOLN
80.0000 meq | INTRAVENOUS | Status: DC
  Filled 2018-09-21: qty 40

## 2018-09-21 MED ORDER — CHLORHEXIDINE GLUCONATE CLOTH 2 % EX PADS
6.0000 | MEDICATED_PAD | Freq: Once | CUTANEOUS | Status: AC
  Administered 2018-09-21: 6 via TOPICAL

## 2018-09-21 MED ORDER — DIAZEPAM 2 MG PO TABS
2.0000 mg | ORAL_TABLET | Freq: Once | ORAL | Status: AC
  Administered 2018-09-22: 2 mg via ORAL
  Filled 2018-09-21: qty 1

## 2018-09-21 MED ORDER — SODIUM CHLORIDE 0.9% FLUSH: 3.0000 mL | Freq: Two times a day (BID) | INTRAVENOUS | Status: DC

## 2018-09-21 MED ORDER — TRANEXAMIC ACID (OHS) BOLUS VIA INFUSION
15.0000 mg/kg | INTRAVENOUS | Status: AC
  Administered 2018-09-22: 1425 mg via INTRAVENOUS
  Filled 2018-09-21: qty 1425

## 2018-09-21 MED ORDER — TEMAZEPAM 15 MG PO CAPS: 15.0000 mg | ORAL_CAPSULE | Freq: Once | ORAL | Status: DC | PRN

## 2018-09-21 MED ORDER — DEXMEDETOMIDINE HCL IN NACL 400 MCG/100ML IV SOLN
0.1000 ug/kg/h | INTRAVENOUS | Status: DC
  Filled 2018-09-21: qty 100

## 2018-09-21 MED ORDER — MILRINONE LACTATE IN DEXTROSE 20-5 MG/100ML-% IV SOLN
0.3000 ug/kg/min | INTRAVENOUS | Status: AC
  Administered 2018-09-22: .25 ug/kg/min via INTRAVENOUS
  Filled 2018-09-21: qty 100

## 2018-09-21 MED ORDER — SODIUM CHLORIDE 0.9 % IV SOLN
INTRAVENOUS | Status: AC
  Administered 2018-09-21: 10:00:00 via INTRAVENOUS

## 2018-09-21 MED ORDER — ALPRAZOLAM 0.25 MG PO TABS: 0.2500 mg | ORAL_TABLET | ORAL | Status: DC | PRN

## 2018-09-21 MED ORDER — ASPIRIN 81 MG PO CHEW: 81.0000 mg | CHEWABLE_TABLET | Freq: Every day | ORAL | Status: DC

## 2018-09-21 MED ORDER — METOPROLOL TARTRATE 12.5 MG HALF TABLET
12.5000 mg | ORAL_TABLET | Freq: Once | ORAL | Status: AC
  Administered 2018-09-22: 12.5 mg via ORAL
  Filled 2018-09-21: qty 1

## 2018-09-21 MED ORDER — SODIUM CHLORIDE 0.9% FLUSH: 3.0000 mL | INTRAVENOUS | Status: DC | PRN

## 2018-09-21 MED ORDER — HEPARIN (PORCINE) IN NACL 1000-0.9 UT/500ML-% IV SOLN
INTRAVENOUS | Status: DC | PRN
  Administered 2018-09-21 (×2): 500 mL

## 2018-09-21 MED ORDER — CHLORHEXIDINE GLUCONATE CLOTH 2 % EX PADS
6.0000 | MEDICATED_PAD | Freq: Once | CUTANEOUS | Status: AC
  Administered 2018-09-22: 6 via TOPICAL

## 2018-09-21 MED ORDER — DIAZEPAM 5 MG PO TABS: 5.0000 mg | ORAL_TABLET | ORAL | Status: DC | PRN

## 2018-09-21 MED ORDER — NITROGLYCERIN IN D5W 200-5 MCG/ML-% IV SOLN
2.0000 ug/min | INTRAVENOUS | Status: DC
  Filled 2018-09-21: qty 250

## 2018-09-21 MED ORDER — MAGNESIUM SULFATE 50 % IJ SOLN
40.0000 meq | INTRAMUSCULAR | Status: DC
  Filled 2018-09-21: qty 9.85

## 2018-09-21 MED ORDER — VERAPAMIL HCL 2.5 MG/ML IV SOLN
INTRAVENOUS | Status: AC
  Filled 2018-09-21: qty 2

## 2018-09-21 MED ORDER — PHENYLEPHRINE HCL-NACL 20-0.9 MG/250ML-% IV SOLN
30.0000 ug/min | INTRAVENOUS | Status: DC
  Filled 2018-09-21: qty 250

## 2018-09-21 MED ORDER — MIDAZOLAM HCL 2 MG/2ML IJ SOLN
INTRAMUSCULAR | Status: AC
  Filled 2018-09-21: qty 2

## 2018-09-21 MED ORDER — BISACODYL 5 MG PO TBEC
5.0000 mg | DELAYED_RELEASE_TABLET | Freq: Once | ORAL | Status: AC
  Administered 2018-09-21: 5 mg via ORAL
  Filled 2018-09-21: qty 1

## 2018-09-21 MED ORDER — ONDANSETRON HCL 4 MG/2ML IJ SOLN: 4.0000 mg | Freq: Four times a day (QID) | INTRAMUSCULAR | Status: DC | PRN

## 2018-09-21 MED ORDER — VANCOMYCIN HCL 10 G IV SOLR
1500.0000 mg | INTRAVENOUS | Status: DC
  Filled 2018-09-21: qty 1500

## 2018-09-21 MED ORDER — DOPAMINE-DEXTROSE 3.2-5 MG/ML-% IV SOLN
0.0000 ug/kg/min | INTRAVENOUS | Status: DC
  Filled 2018-09-21: qty 250

## 2018-09-21 MED ORDER — ASPIRIN 81 MG PO CHEW
81.0000 mg | CHEWABLE_TABLET | ORAL | Status: AC
  Administered 2018-09-21: 81 mg via ORAL
  Filled 2018-09-21: qty 1

## 2018-09-21 MED ORDER — PLASMA-LYTE 148 IV SOLN
INTRAVENOUS | Status: DC
  Filled 2018-09-21: qty 2.5

## 2018-09-21 MED ORDER — IOHEXOL 350 MG/ML SOLN
INTRAVENOUS | Status: DC | PRN
  Administered 2018-09-21: 50 mL via INTRA_ARTERIAL

## 2018-09-21 MED ORDER — SODIUM CHLORIDE 0.9 % IV SOLN
INTRAVENOUS | Status: DC
  Filled 2018-09-21: qty 30

## 2018-09-21 MED ORDER — TRANEXAMIC ACID (OHS) PUMP PRIME SOLUTION
2.0000 mg/kg | INTRAVENOUS | Status: DC
  Filled 2018-09-21: qty 1.9

## 2018-09-21 MED ORDER — FENTANYL CITRATE (PF) 100 MCG/2ML IJ SOLN
INTRAMUSCULAR | Status: DC | PRN
  Administered 2018-09-21 (×2): 25 ug via INTRAVENOUS

## 2018-09-21 MED ORDER — INSULIN REGULAR(HUMAN) IN NACL 100-0.9 UT/100ML-% IV SOLN
INTRAVENOUS | Status: DC
  Filled 2018-09-21: qty 100

## 2018-09-21 MED ORDER — SODIUM CHLORIDE 0.9 % IV SOLN
INTRAVENOUS | Status: DC
  Administered 2018-09-21: 07:00:00 via INTRAVENOUS

## 2018-09-21 MED ORDER — MIDAZOLAM HCL 2 MG/2ML IJ SOLN
INTRAMUSCULAR | Status: DC | PRN
  Administered 2018-09-21 (×2): 1 mg via INTRAVENOUS

## 2018-09-21 MED ORDER — HEPARIN (PORCINE) 25000 UT/250ML-% IV SOLN
1050.0000 [IU]/h | INTRAVENOUS | Status: DC
  Administered 2018-09-21: 950 [IU]/h via INTRAVENOUS
  Filled 2018-09-21: qty 250

## 2018-09-21 MED ORDER — SODIUM CHLORIDE 0.9 % IV SOLN
750.0000 mg | INTRAVENOUS | Status: DC
  Filled 2018-09-21: qty 750

## 2018-09-21 MED ORDER — ATORVASTATIN CALCIUM 80 MG PO TABS
80.0000 mg | ORAL_TABLET | Freq: Every day | ORAL | Status: DC
  Administered 2018-09-21: 80 mg via ORAL
  Filled 2018-09-21: qty 1

## 2018-09-21 MED ORDER — SODIUM CHLORIDE 0.9 % IV SOLN
1.5000 g | INTRAVENOUS | Status: AC
  Administered 2018-09-22: 1.5 g via INTRAVENOUS
  Administered 2018-09-22: .75 g via INTRAVENOUS
  Filled 2018-09-21: qty 1.5

## 2018-09-21 MED ORDER — ACETAMINOPHEN 325 MG PO TABS: 650.0000 mg | ORAL_TABLET | ORAL | Status: DC | PRN

## 2018-09-21 MED ORDER — HEPARIN (PORCINE) IN NACL 1000-0.9 UT/500ML-% IV SOLN
INTRAVENOUS | Status: AC
  Filled 2018-09-21: qty 1000

## 2018-09-21 MED ORDER — LIDOCAINE HCL (PF) 1 % IJ SOLN
INTRAMUSCULAR | Status: DC | PRN
  Administered 2018-09-21 (×2): 2 mL
  Administered 2018-09-21: 15 mL

## 2018-09-21 SURGICAL SUPPLY — 11 items
CATH BALLN WEDGE 5F 110CM (CATHETERS) ×1 IMPLANT
CATH INFINITI 5FR MULTPACK ANG (CATHETERS) ×1 IMPLANT
GLIDESHEATH SLEND SS 6F .021 (SHEATH) ×1 IMPLANT
KIT HEART LEFT (KITS) ×2 IMPLANT
PACK CARDIAC CATHETERIZATION (CUSTOM PROCEDURE TRAY) ×2 IMPLANT
SHEATH GLIDE SLENDER 4/5FR (SHEATH) ×1 IMPLANT
SHEATH PINNACLE 6F 10CM (SHEATH) ×1 IMPLANT
TRANSDUCER W/STOPCOCK (MISCELLANEOUS) ×2 IMPLANT
TUBING CIL FLEX 10 FLL-RA (TUBING) ×2 IMPLANT
WIRE EMERALD 3MM-J .035X150CM (WIRE) ×1 IMPLANT
WIRE HI TORQ VERSACORE-J 145CM (WIRE) ×1 IMPLANT

## 2018-09-21 NOTE — Interval H&P Note (Signed)
Cath Lab Visit (complete for each Cath Lab visit)  Clinical Evaluation Leading to the Procedure:   ACS: No.  Non-ACS:    Anginal Classification: CCS III  Anti-ischemic medical therapy: Minimal Therapy (1 class of medications)  Non-Invasive Test Results: High-risk stress test findings: cardiac mortality >3%/year  Prior CABG: No previous CABG      History and Physical Interval Note:  09/21/2018 7:48 AM  Jennifer [name]  has presented today for surgery, with the diagnosis of as - abn stress test - cad  The various methods of treatment have been discussed with the patient and family. After consideration of risks, benefits and other options for treatment, the patient has consented to  Procedure(s): RIGHT/LEFT HEART CATH AND CORONARY ANGIOGRAPHY (N/A) as a surgical intervention .  The patient's history has been reviewed, patient examined, no change in status, stable for surgery.  I have reviewed the patient's chart and labs.  Questions were answered to the patient's satisfaction.     Shelva Majestic

## 2018-09-21 NOTE — Progress Notes (Signed)
ANTICOAGULATION CONSULT NOTE - Initial Consult  Pharmacy Consult for heparin Indication: chest pain/ACS  Allergies  Allergen Reactions  . Poison Oak Extract Rash    Patient Measurements: Height: 5\' 2"  (157.5 cm) Weight: 181 lb (82.1 kg) IBW/kg (Calculated) : 50.1 Heparin Dosing Weight: 68kg  Vital Signs: Temp: 97.8 F (36.6 C) (12/05 0613) Temp Source: Oral (12/05 1601) BP: 114/77 (12/05 0935) Pulse Rate: 69 (12/05 0935)  Labs: Recent Labs    09/19/18 1422  HGB 13.1  HCT 39.6  PLT 248  LABPROT 10.3  INR 1.0  CREATININE 0.99    Estimated Creatinine Clearance: 58.5 mL/min (by C-G formula based on SCr of 0.99 mg/dL).   Medical History: Past Medical History:  Diagnosis Date  . Congestive heart failure (CHF) ([place])   . Fibromyalgia   . GERD (gastroesophageal reflux disease)   . Hypercholesteremia   . Hypertension   . IBS (irritable bowel syndrome)   . Mild sleep apnea    no CPAP  . Nonischemic cardiomyopathy (HCC)    ECHO EF 40-45%, Septal Thickness,and mild global left ventricular dysfunction; stress test 03/2004 +VE, refersible defect; ECHO 03/2005 @[facility]  EF 55%   Assessment: 62 year old female seen in office 12/3 with unstable angina and abnormal stress test. Now s/p cath this morning. Report pending, received orders to start heparin this afternoon.   Goal of Therapy:  Heparin level 0.3-0.7 units/ml Monitor platelets by anticoagulation protocol: Yes   Plan:  Start heparin infusion at 950 units/hr Check anti-Xa level in .6 hours and daily while on heparin Continue to monitor H&H and platelets  Erin Hearing PharmD., BCPS Clinical Pharmacist 09/21/2018 10:07 AM

## 2018-09-21 NOTE — Consult Note (Deleted)
[address]       [place] [address]             [phone]        Jennifer [name] [place] Medical Record #[id number] Date of Birth: December 07, 1955  Referring: Claiborne Billings Primary Care: Monico Blitz, MD Primary Cardiologist:Suresh Bronson Ing, MD  Chief Complaint:  CAD, NICM  History of Present Illness:      Mrs. Badertscher is a 62 yo obese white female with known history of HTN, Fibromyalgia, Hypercholesterolemia, and Sleep Apnea.  The patient had not been seen by Cardiology since 03/2009.  She presented 11/7 with complaints of exertional chest discomfort associated with shortness of breath, nausea and vomiting over the past 6 months.  These episodes relieve with rest.  She was given NTG and set up for Myoview stress test.  This was performed 09/01/2018 and showed a reduced EF of 18 %, no evidence of ischemia, and a large area of scar.  It was felt to be a high risk study and she was set up for cardiac catheterization.  This was performed today and showed severe LM equivalent disease.  It was felt cardiothoracic surgery should be consulted urgently for surgery.  Currently the patient is chest pain free.  She states that thinking back on things her episodes of chest pain actually begin in Jan/Feb of this year.  She states the episodes got worse in June when her husband was critically ill and ultimately passed away.  She states she felt the episodes were likely stress related.  She unfortunately let her insurance coverage go after his death and is now getting caught back up on things as she regained insurance coverage.  She denies recent smoking history.  She states she smoke when she was in school, for maybe 10 years.  She states that she is very active, but has been limited by her exertional symptoms as of late.  She has a + FH of heart disease with her brother passing in his 21s' and her mother her early 33s.   Current Activity/ Functional Status: Patient is independent with  mobility/ambulation, transfers, ADL's, IADL's.   Zubrod Score: At the time of surgery this patient's most appropriate activity status/level should be described as: []     0    Normal activity, no symptoms [x]     1    Restricted in physical strenuous activity but ambulatory, able to do out light work []     2    Ambulatory and capable of self care, unable to do work activities, up and about                 more than 50%  Of the time                            []     3    Only limited self care, in bed greater than 50% of waking hours []     4    Completely disabled, no self care, confined to bed or chair []     5    Moribund  Past Medical History:  Diagnosis Date  . Congestive heart failure (CHF) ([place])   . Fibromyalgia   . GERD (gastroesophageal reflux disease)   . Hypercholesteremia   . Hypertension   . IBS (irritable bowel syndrome)   . Mild sleep apnea    no CPAP  . Nonischemic cardiomyopathy ([place])  ECHO EF 40-45%, Septal Thickness,and mild global left ventricular dysfunction; stress test 03/2004 +VE, refersible defect; ECHO 03/2005 @[facility]  EF 55%    Past Surgical History:  Procedure Laterality Date  . ANKLE FRACTURE SURGERY Left 2000  . CARDIAC CATHETERIZATION  03/2005    Social History   Tobacco Use  Smoking Status Never Smoker  Smokeless Tobacco Never Used    Social History   Substance and Sexual Activity  Alcohol Use Not on file     Allergies  Allergen Reactions  . Poison Oak Extract Rash    Current Facility-Administered Medications  Medication Dose Route Frequency Provider Last Rate Last Dose  . 0.9 %  sodium chloride infusion   Intravenous Continuous Troy Sine, MD 100 mL/hr at 09/21/18 1000    . 0.9 %  sodium chloride infusion  250 mL Intravenous PRN Troy Sine, MD      . acetaminophen (TYLENOL) tablet 650 mg  650 mg Oral Q4H PRN Troy Sine, MD      . Derrill Memo ON 09/22/2018] aspirin chewable tablet 81 mg  81 mg Oral Daily Troy Sine, MD        . atorvastatin (LIPITOR) tablet 80 mg  80 mg Oral q1800 Troy Sine, MD      . diazepam (VALIUM) tablet 5 mg  5 mg Oral Q4H PRN Troy Sine, MD      . heparin ADULT infusion 100 units/mL (25000 units/261mL sodium chloride 0.45%)  950 Units/hr Intravenous Continuous Lyndee Leo, RPH      . ondansetron [facility]) injection 4 mg  4 mg Intravenous Q6H PRN Troy Sine, MD      . sodium chloride flush (NS) 0.9 % injection 3 mL  3 mL Intravenous Q12H Shelva Majestic A, MD      . sodium chloride flush (NS) 0.9 % injection 3 mL  3 mL Intravenous PRN Troy Sine, MD        Medications Prior to Admission  Medication Sig Dispense Refill Last Dose  . aspirin EC 81 MG tablet Take 81 mg by mouth at bedtime.    09/20/2018 at Unknown time  . carvedilol (COREG) 3.125 MG tablet Take 3.125 mg by mouth 2 (two) times daily with a meal.   09/21/2018 at 0500  . cholecalciferol (VITAMIN D3) 25 MCG (1000 UT) tablet Take 1,000 Units by mouth daily.   09/21/2018 at 0500  . diclofenac (VOLTAREN) 75 MG EC tablet Take 75 mg by mouth 2 (two) times daily.   09/21/2018 at 0500  . DULoxetine (CYMBALTA) 60 MG capsule Take 60 mg by mouth 2 (two) times daily.   09/21/2018 at 0500  . gabapentin (NEURONTIN) 300 MG capsule Take 600 mg by mouth 3 (three) times daily.   Taking  . hydrochlorothiazide (HYDRODIURIL) 25 MG tablet Take 25 mg by mouth daily.   09/21/2018 at 0500  . nitroGLYCERIN (NITROSTAT) 0.4 MG SL tablet Place 0.4 mg under the tongue every 5 (five) minutes x 3 doses as needed for chest pain (if no relief after 3rd dose, proceed to the ED for an evaluation).   Taking  . Phenylephrine-APAP-guaiFENesin (MUCINEX SINUS-MAX PO) Take 2 tablets by mouth 2 (two) times daily as needed (sinus headaches).     . quinapril (ACCUPRIL) 10 MG tablet Take 10 mg by mouth daily.   Taking  . vitamin C (ASCORBIC ACID) 500 MG tablet Take 500 mg by mouth daily.   09/21/2018 at 0500    Family History  Problem Relation Age of Onset  .  Diabetes Mellitus II Mother   . Heart disease Mother 18  . Colon cancer Father   . Alcohol abuse Father   . Depression Father   . Congestive Heart Failure Brother 32       probably dilated cardiomyopathy after PNA   Review of Systems:   ROS Constitutional: negative Respiratory: positive for dyspnea on exertion Cardiovascular: positive for dyspnea and exertional chest pressure/discomfort Gastrointestinal: positive for IBS Musculoskeletal:negative Neurological: negative     Cardiac Review of Systems: Y or  [    ]= no  Chest Pain [ Y, exertional   ]  Resting SOB [   ] Exertional SOB  [ Y ]  Orthopnea [  ]   Pedal Edema Aqua.Slicker   ]    Palpitations [ N ] Syncope  [  ]   Presyncope [   ]  General Review of Systems: [Y] = yes [  ]=no Constitional: recent weight change [ N ]; anorexia [  ]; fatigue [  ]; nausea [Y, with chest pain  ]; night sweats [  ]; fever [  ]; or chills [  ]                                                               Dental: Last Dentist visit:   Eye : blurred vision [  ]; diplopia [   ]; vision changes [  ];  Amaurosis fugax[  ]; Resp: cough [ N ];  wheezing[  ];  hemoptysis[  ]; shortness of breath[  ]; paroxysmal nocturnal dyspnea[  ]; dyspnea on exertion[  ]; or orthopnea[  ];  GI:  gallstones[  ], vomiting[ N ];  dysphagia[  ]; melena[  ];  hematochezia [  ]; heartburn[  ];   Hx of  Colonoscopy[  ]; GU: kidney stones [  ]; hematuria[  ];   dysuria [  ];  nocturia[  ];  history of     obstruction [  ]; urinary frequency [  ]             Skin: rash, swelling[ N ];, hair loss[  ];  peripheral edema[N  ];  or itching[  ]; Musculosketetal: myalgias[  ];  joint swelling[  ];  joint erythema[  ];  joint pain[  ];  back pain[  ];  Heme/Lymph: bruising[  ];  bleeding[  ];  anemia[  ];  Neuro: TIA[  ];  headaches[  ];  stroke[N  ];  vertigo[  ];  seizures[  ];   paresthesias[  ];  difficulty walking[  ];  Psych:depression[  ]; anxiety[  ];  Endocrine: diabetes[N  ];  thyroid  dysfunction[N  ];  Physical Exam: BP (!) 116/57 (BP Location: Left Arm)   Pulse 86   Temp 98.1 F (36.7 C) (Oral)   Resp 16   Ht 5\' 2"  (1.575 m)   Wt 95 kg   SpO2 95%   BMI 38.31 kg/m   General appearance: alert, cooperative and no distress Head: Normocephalic, without obvious abnormality, atraumatic Neck: no adenopathy, no carotid bruit, no JVD, supple, symmetrical, trachea midline and thyroid not enlarged, symmetric, no tenderness/mass/nodules Resp: clear to auscultation bilaterally Cardio: regular rate and rhythm  GI: soft, non-tender; bowel sounds normal; no masses,  no organomegaly Extremities: extremities normal, atraumatic, no cyanosis or edema Neurologic: Grossly normal  Diagnostic Studies & Laboratory data:     Recent Radiology Findings:   No results found.   I have independently reviewed the above radiologic studies and discussed with the patient   Recent Lab Findings: Lab Results  Component Value Date   WBC 6.8 09/19/2018   HGB 13.1 09/19/2018   HCT 39.6 09/19/2018   PLT 248 09/19/2018   GLUCOSE 70 09/19/2018   NA 143 09/19/2018   K 4.1 09/19/2018   CL 105 09/19/2018   CREATININE 0.99 09/19/2018   BUN 20 09/19/2018   CO2 18 (L) 09/19/2018   INR 1.0 09/19/2018    Assessment / Plan:      1. CV- CAD, NICM- LM Equivalent, EF is low 18% on myoview, 20-25% on cath-- large evidence of scar on myoview test---- patient is currently chest pain free, 2. Fibromyalgia 3. HTN 4. Hyperlipidemia 5. Dispo- patien stable, chest pain free, complete preoperative workup.. Surgeon to evaluate once out of operating room and will follow up with further recommendations  I  spent 60 minutes counseling the patient face to face.   Shaundra Fullam PA-C 09/21/2018 11:21 AM

## 2018-09-21 NOTE — Progress Notes (Signed)
Pre-op Cardiac Surgery  Carotid Findings:  Right - 1% to 39% Left - 40% to 59%. Bilateral vertebral artery flow is antegrade.  Upper Extremity Right Left  Brachial Pressures Pressure Bandage Triphasic 112 Triphasic  Radial Waveforms Triphasic Triphasic  Ulnar Waveforms Triphasic Triphasic  Palmar Arch (Allen's Test) Normal Normal   Findings:  Palmar arch evaluation - Doppler waveforms remained normal bilaterally with both radial and ulnar compressions.    Lower  Extremity Right Left  Dorsalis Pedis 134 Biphasic 160 Biphasic  Posterior Tibial 160 Triphasic 153 Triphasic  Ankle/Brachial Indices 1.43 1.43    Findings:  Bilateral ABIs are elevated possible due to calcification. Doppler waveforms are normal.  [place] Thurston Brendlinger,RVS 08/22/2018, 12:36 PM

## 2018-09-21 NOTE — H&P (View-Only) (Signed)
Reason for Consult:2 vessel CAD Referring Physician: Dr. Isabell Jarvis, Primary- Dr. Bennett Scrape Jennifer [name] is an 62 y.o. female.  HPI: Jennifer [name] is a 62 yo woman with a past history of nonischemic cardiomyopathy diagnosed ~15 years ago with normal coronaries by cath at that time. She also has a past history of congestive heart failure, fibromyalgia, GERD, sleep apnea, hyperlipidemia, hypertension and IBS. She began having exertional chest pain almost a year ago. She would get an aching pain in the center of the chest. Relieved by rest. Some days would occur with relatively minimal activity, others would not have even with heavy activity.   She was dealing with her husband's illness during this time and did not seek medical attention. Recently she has been having more severe pain sometimes associated with nausea and dizziness. 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 She has never had rest or nocturnal symptoms and the symptoms have always resolved with rest.  An echo 09/07/2018 showed an EF of ~20%(down from a previous reported 40%). There was a question of low output aortic stenosis with an estimated valve area of 0.92- 1.05 cm2. Today she had an elective outpatient cath which revealed 90% ostial LAD and 50% ostial circumflex lesions.   Right heart cath showed RA= 13, RV 41/11, PA 43/20, Wedge 27 Aortic valve gradient mean= 3.4 mmHg  Currently pain free  Past Medical History:  Diagnosis Date  . Congestive heart failure (CHF) ([facility])   . Fibromyalgia   . GERD (gastroesophageal reflux disease)   . Hypercholesteremia   . Hypertension   . IBS (irritable bowel syndrome)   . Mild sleep apnea    no CPAP  . Nonischemic cardiomyopathy ([facility])    ECHO EF 40-45%, Septal Thickness,and mild global left ventricular dysfunction; stress test 03/2004 +VE, refersible defect; ECHO 03/2005 @[facility]  EF 55%    Past Surgical History:  Procedure Laterality Date  . ANKLE FRACTURE SURGERY Left 2000  . CARDIAC  CATHETERIZATION  03/2005  . RIGHT/LEFT HEART CATH AND CORONARY ANGIOGRAPHY N/A 09/21/2018   Procedure: RIGHT/LEFT HEART CATH AND CORONARY ANGIOGRAPHY;  Surgeon: Troy Sine, MD;  Location: [facility];  Service: Cardiovascular;  Laterality: N/A;    Family History  Problem Relation Age of Onset  . Diabetes Mellitus II Mother   . Heart disease Mother 28  . Colon cancer Father   . Alcohol abuse Father   . Depression Father   . Congestive Heart Failure Brother 32       probably dilated cardiomyopathy after PNA    Social History:  reports that she has never smoked. She has never used smokeless tobacco. Her alcohol and drug histories are not on file.  Allergies:  Allergies  Allergen Reactions  . Poison Oak Extract Rash    Medications:  Scheduled: . [START ON 09/22/2018] aspirin  81 mg Oral Daily  . atorvastatin  80 mg Oral q1800  . sodium chloride flush  3 mL Intravenous Q12H    Results for orders placed or performed during the hospital encounter of 09/21/18 (from the past 48 hour(s))  Surgical pcr screen     Status: None   Collection Time: 09/21/18 10:09 AM  Result Value Ref Range   MRSA, PCR NEGATIVE NEGATIVE   Staphylococcus aureus NEGATIVE NEGATIVE    Comment: (NOTE) The Xpert SA Assay (FDA approved for NASAL specimens in patients 33 years of age and older), is one component of a comprehensive surveillance program. It is not  intended to diagnose infection nor to guide or monitor treatment. Performed at [facility], [place] [address], [place], [place] [address]     No results found.  Review of Systems  Constitutional: Negative for diaphoresis, fever and malaise/fatigue.  Eyes: Negative for blurred vision and double vision.  Respiratory: Negative for shortness of breath and wheezing.   Cardiovascular: Positive for chest pain. Negative for orthopnea, claudication, leg swelling and PND.  Gastrointestinal: Positive for heartburn and nausea. Negative for  vomiting.  Genitourinary: Negative for dysuria and urgency.  Neurological: Positive for dizziness. Negative for focal weakness and loss of consciousness.  All other systems reviewed and are negative.  Blood pressure 112/72, pulse 73, temperature 98.1 F (36.7 C), temperature source Oral, resp. rate 15, height 5\' 2"  (1.575 m), weight 95 kg, SpO2 94 %. Physical Exam  Vitals reviewed. Constitutional: She is oriented to person, place, and time. She appears well-developed and well-nourished. No distress.  HENT:  Head: Normocephalic and atraumatic.  Mouth/Throat: No oropharyngeal exudate.  Eyes: Conjunctivae and EOM are normal. No scleral icterus.  Neck: Neck supple. No thyromegaly present.  Cardiovascular: Normal rate, regular rhythm and intact distal pulses. Exam reveals no gallop.  Murmur (2/6 systolic) heard. Respiratory: Effort normal and breath sounds normal. No respiratory distress. She has no wheezes. She has no rales.  GI: Soft. She exhibits no distension. There is no tenderness.  Musculoskeletal: She exhibits no edema or deformity.  Lymphadenopathy:    She has no cervical adenopathy.  Neurological: She is alert and oriented to person, place, and time. No cranial nerve deficit. She exhibits normal muscle tone.  Skin: Skin is warm and dry.  ECHO 11/07/2017 Study Conclusions  - Left ventricle: The cavity size was moderately dilated. Wall   thickness was normal. Systolic function was severely reduced. The   estimated ejection fraction was in the range of 20% to 25%. - Regional wall motion abnormality: Akinesis of the basal-mid   anteroseptal myocardium. - Aortic valve: Mildly calcified annulus. Trileaflet; mildly   thickened leaflets. Valve area (VTI): 0.94 cm^2. Valve area   (Vmax): 1.05 cm^2. Valve area (Vmean): 0.92 cm^2. - Mitral valve: Mildly calcified annulus. Mildly thickened leaflets   . There was mild regurgitation. - Left atrium: The atrium was severely dilated. -  Atrial septum: No defect or patent foramen ovale was identified. CARDIAC CATHETERIZATION Coronary Findings   Diagnostic  Dominance: Left  Left Main  Ost LM to Dist LM lesion 90% stenosed  Ost LM to Dist LM lesion is 90% stenosed.  Left Circumflex  Ost Cx to Prox Cx lesion 50% stenosed  Ost Cx to Prox Cx lesion is 50% stenosed.  Intervention   No interventions have been documented.  Right Heart   Right Heart Pressures RA: A-wave 13, V wave 12; mean 10 RV: 41/11 PA: 43/20; mean 32 PW: A-wave 27, V wave 32, mean 25  Initial AO 103/69.  Pullback: LV 103/25 AO: 103/65  Mean aortic valve gradient 3.4 mm Hg  Oxygen saturation in the pulmonary artery 63% and in the central aorta 97% By the Fick method, cardiac output 4.0 L/min with a cardiac index of 2.2 L/min/m.   I personally reviewed the cath images and concur with the findings noted above  Assessment/Plan: 62 yo woman with a past history of nonischemic cardiomyopathy, obesity, hypertension, hyperlipidemia, OSA, fibromyalgia, and GERD who presents with exertional angina. Workup revealed severe LV dysfunction with an EF of 20% and severe 2 vessel CAD with a  90% ostial LAD and a 50% ostial circumflex stenoses.   She has never had rest or nocturnal pain and has never had pain that did not go away with rest.  She has a history of nonischemic cardiomyopathy and severe LV systolic dysfunction, but no heart failure symptoms and is well compensated with a CI of 2.2.  CABG is indicated for survival benefit and relief of symptoms.  I have discussed the general nature of the procedure, the need for general anesthesia, the incisions to be used and the use of cardiopulmonary bypass with Jennifer [name] and her family. We discussed the expected hospital stay, overall recovery and short and long term outcomes. I informed her of the indications, risks, benefits and alternatives. They understand the risks include, but are not limited to death,  stroke, MI, DVT/PE, bleeding, possible need for transfusion, infections, cardiac arrhythmias, as well as other organ system dysfunction including respiratory, renal, or GI complications. Increased risk for failure to wean/ need for support device.   Will assess AoV with intraop TEE  She accepts the risks and agrees to proceed.  Melrose Nakayama 09/21/2018, 12:41 PM

## 2018-09-21 NOTE — Progress Notes (Signed)
Discussed sternal precautions, IS (1700 mL), d/c planning, and mobility. Good reception. Her daughter will be with her at d/c. Gave careguide and OHS booklet. Gave preop video to watch with family.  [place], ACSM 1:14 PM 09/21/2018

## 2018-09-21 NOTE — Progress Notes (Signed)
Site area: Right groin a 6 french arterial sheath was removed  Site Prior to Removal:  Level 0  Pressure Applied For 20 MINUTES    Bedrest Beginning at 0940am  Manual:   Yes.    Patient Status During Pull:  stable  Post Pull Groin Site:  Level 0  Post Pull Instructions Given:  Yes.    Post Pull Pulses Present:  Yes.    Dressing Applied:  Yes.    Comments:  VS remain stable

## 2018-09-21 NOTE — Plan of Care (Signed)
  Problem: Education: Goal: Understanding of CV disease, CV risk reduction, and recovery process will improve Outcome: Progressing Goal: Individualized Educational Video(s) Outcome: Progressing   Problem: Cardiovascular: Goal: Vascular access site(s) Level 0-1 will be maintained Outcome: Progressing

## 2018-09-21 NOTE — Consult Note (Signed)
Reason for Consult:2 vessel CAD Referring Physician: Dr. Isabell Jarvis, Primary- Dr. Bennett Scrape Jennifer [name] is an 62 y.o. female.  HPI: Jennifer [name] is a 62 yo woman with a past history of nonischemic cardiomyopathy diagnosed ~15 years ago with normal coronaries by cath at that time. She also has a past history of congestive heart failure, fibromyalgia, GERD, sleep apnea, hyperlipidemia, hypertension and IBS. She began having exertional chest pain almost a year ago. She would get an aching pain in the center of the chest. Relieved by rest. Some days would occur with relatively minimal activity, others would not have even with heavy activity.   She was dealing with her husband's illness during this time and did not seek medical attention. Recently she has been having more severe pain sometimes associated with nausea and dizziness. 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 She has never had rest or nocturnal symptoms and the symptoms have always resolved with rest.  An echo 09/07/2018 showed an EF of ~20%(down from a previous reported 40%). There was a question of low output aortic stenosis with an estimated valve area of 0.92- 1.05 cm2. Today she had an elective outpatient cath which revealed 90% ostial LAD and 50% ostial circumflex lesions.   Right heart cath showed RA= 13, RV 41/11, PA 43/20, Wedge 27 Aortic valve gradient mean= 3.4 mmHg  Currently pain free  Past Medical History:  Diagnosis Date  . Congestive heart failure (CHF) ([place])   . Fibromyalgia   . GERD (gastroesophageal reflux disease)   . Hypercholesteremia   . Hypertension   . IBS (irritable bowel syndrome)   . Mild sleep apnea    no CPAP  . Nonischemic cardiomyopathy ([facility])    ECHO EF 40-45%, Septal Thickness,and mild global left ventricular dysfunction; stress test 03/2004 +VE, refersible defect; ECHO 03/2005 @[facility]  EF 55%    Past Surgical History:  Procedure Laterality Date  . ANKLE FRACTURE SURGERY Left 2000  . CARDIAC  CATHETERIZATION  03/2005  . RIGHT/LEFT HEART CATH AND CORONARY ANGIOGRAPHY N/A 09/21/2018   Procedure: RIGHT/LEFT HEART CATH AND CORONARY ANGIOGRAPHY;  Surgeon: Troy Sine, MD;  Location: [facility];  Service: Cardiovascular;  Laterality: N/A;    Family History  Problem Relation Age of Onset  . Diabetes Mellitus II Mother   . Heart disease Mother 55  . Colon cancer Father   . Alcohol abuse Father   . Depression Father   . Congestive Heart Failure Brother 32       probably dilated cardiomyopathy after PNA    Social History:  reports that she has never smoked. She has never used smokeless tobacco. Her alcohol and drug histories are not on file.  Allergies:  Allergies  Allergen Reactions  . Poison Oak Extract Rash    Medications:  Scheduled: . [START ON 09/22/2018] aspirin  81 mg Oral Daily  . atorvastatin  80 mg Oral q1800  . sodium chloride flush  3 mL Intravenous Q12H    Results for orders placed or performed during the hospital encounter of 09/21/18 (from the past 48 hour(s))  Surgical pcr screen     Status: None   Collection Time: 09/21/18 10:09 AM  Result Value Ref Range   MRSA, PCR NEGATIVE NEGATIVE   Staphylococcus aureus NEGATIVE NEGATIVE    Comment: (NOTE) The Xpert SA Assay (FDA approved for NASAL specimens in patients 20 years of age and older), is one component of a comprehensive surveillance program. It is not  intended to diagnose infection nor to guide or monitor treatment. Performed at [facility], [place] [address] [place], [place] [address]     No results found.  Review of Systems  Constitutional: Negative for diaphoresis, fever and malaise/fatigue.  Eyes: Negative for blurred vision and double vision.  Respiratory: Negative for shortness of breath and wheezing.   Cardiovascular: Positive for chest pain. Negative for orthopnea, claudication, leg swelling and PND.  Gastrointestinal: Positive for heartburn and nausea. Negative for  vomiting.  Genitourinary: Negative for dysuria and urgency.  Neurological: Positive for dizziness. Negative for focal weakness and loss of consciousness.  All other systems reviewed and are negative.  Blood pressure 112/72, pulse 73, temperature 98.1 F (36.7 C), temperature source Oral, resp. rate 15, height 5\' 2"  (1.575 m), weight 95 kg, SpO2 94 %. Physical Exam  Vitals reviewed. Constitutional: She is oriented to person, place, and time. She appears well-developed and well-nourished. No distress.  HENT:  Head: Normocephalic and atraumatic.  Mouth/Throat: No oropharyngeal exudate.  Eyes: Conjunctivae and EOM are normal. No scleral icterus.  Neck: Neck supple. No thyromegaly present.  Cardiovascular: Normal rate, regular rhythm and intact distal pulses. Exam reveals no gallop.  Murmur (2/6 systolic) heard. Respiratory: Effort normal and breath sounds normal. No respiratory distress. She has no wheezes. She has no rales.  GI: Soft. She exhibits no distension. There is no tenderness.  Musculoskeletal: She exhibits no edema or deformity.  Lymphadenopathy:    She has no cervical adenopathy.  Neurological: She is alert and oriented to person, place, and time. No cranial nerve deficit. She exhibits normal muscle tone.  Skin: Skin is warm and dry.  ECHO 11/07/2017 Study Conclusions  - Left ventricle: The cavity size was moderately dilated. Wall   thickness was normal. Systolic function was severely reduced. The   estimated ejection fraction was in the range of 20% to 25%. - Regional wall motion abnormality: Akinesis of the basal-mid   anteroseptal myocardium. - Aortic valve: Mildly calcified annulus. Trileaflet; mildly   thickened leaflets. Valve area (VTI): 0.94 cm^2. Valve area   (Vmax): 1.05 cm^2. Valve area (Vmean): 0.92 cm^2. - Mitral valve: Mildly calcified annulus. Mildly thickened leaflets   . There was mild regurgitation. - Left atrium: The atrium was severely dilated. -  Atrial septum: No defect or patent foramen ovale was identified. CARDIAC CATHETERIZATION Coronary Findings   Diagnostic  Dominance: Left  Left Main  Ost LM to Dist LM lesion 90% stenosed  Ost LM to Dist LM lesion is 90% stenosed.  Left Circumflex  Ost Cx to Prox Cx lesion 50% stenosed  Ost Cx to Prox Cx lesion is 50% stenosed.  Intervention   No interventions have been documented.  Right Heart   Right Heart Pressures RA: A-wave 13, V wave 12; mean 10 RV: 41/11 PA: 43/20; mean 32 PW: A-wave 27, V wave 32, mean 25  Initial AO 103/69.  Pullback: LV 103/25 AO: 103/65  Mean aortic valve gradient 3.4 mm Hg  Oxygen saturation in the pulmonary artery 63% and in the central aorta 97% By the Fick method, cardiac output 4.0 L/min with a cardiac index of 2.2 L/min/m.   I personally reviewed the cath images and concur with the findings noted above  Assessment/Plan: 62 yo woman with a past history of nonischemic cardiomyopathy, obesity, hypertension, hyperlipidemia, OSA, fibromyalgia, and GERD who presents with exertional angina. Workup revealed severe LV dysfunction with an EF of 20% and severe 2 vessel CAD with a  90% ostial LAD and a 50% ostial circumflex stenoses.   She has never had rest or nocturnal pain and has never had pain that did not go away with rest.  She has a history of nonischemic cardiomyopathy and severe LV systolic dysfunction, but no heart failure symptoms and is well compensated with a CI of 2.2.  CABG is indicated for survival benefit and relief of symptoms.  I have discussed the general nature of the procedure, the need for general anesthesia, the incisions to be used and the use of cardiopulmonary bypass with Jennifer [name] and her family. We discussed the expected hospital stay, overall recovery and short and long term outcomes. I informed her of the indications, risks, benefits and alternatives. They understand the risks include, but are not limited to death,  stroke, MI, DVT/PE, bleeding, possible need for transfusion, infections, cardiac arrhythmias, as well as other organ system dysfunction including respiratory, renal, or GI complications. Increased risk for failure to wean/ need for support device.   Will assess AoV with intraop TEE  She accepts the risks and agrees to proceed.  Jennifer [name] 09/21/2018, 12:41 PM

## 2018-09-22 ENCOUNTER — Inpatient Hospital Stay (HOSPITAL_COMMUNITY)
Admission: AD | Disposition: A | Discharge status: Home / Self Care | Payer: Self-pay | Source: Ambulatory Visit | Attending: Thoracic Surgery (Cardiothoracic Vascular Surgery)

## 2018-09-22 ENCOUNTER — Inpatient Hospital Stay (HOSPITAL_COMMUNITY): Payer: Medicare PPO

## 2018-09-22 ENCOUNTER — Inpatient Hospital Stay (HOSPITAL_COMMUNITY): Payer: Medicare PPO | Admitting: Certified Registered Nurse Anesthetist

## 2018-09-22 DIAGNOSIS — Z951 Presence of aortocoronary bypass graft: Secondary | ICD-10-CM

## 2018-09-22 HISTORY — PX: TEE WITHOUT CARDIOVERSION: SHX5443

## 2018-09-22 HISTORY — PX: CORONARY ARTERY BYPASS GRAFT: SHX141

## 2018-09-22 LAB — POCT I-STAT, CHEM 8
BUN: 16 mg/dL (ref 8–23)
BUN: 15 mg/dL (ref 8–23)
BUN: 12 mg/dL (ref 8–23)
BUN: 13 mg/dL (ref 8–23)
BUN: 14 mg/dL (ref 8–23)
BUN: 13 mg/dL (ref 8–23)
CHLORIDE: 110 mmol/L (ref 98–111)
Calcium, Ion: 1.2 mmol/L (ref 1.15–1.40)
Calcium, Ion: 1.21 mmol/L (ref 1.15–1.40)
Calcium, Ion: 0.95 mmol/L — ABNORMAL LOW (ref 1.15–1.40)
Calcium, Ion: 1.03 mmol/L — ABNORMAL LOW (ref 1.15–1.40)
Calcium, Ion: 1.37 mmol/L (ref 1.15–1.40)
Calcium, Ion: 1.25 mmol/L (ref 1.15–1.40)
Chloride: 107 mmol/L (ref 98–111)
Chloride: 108 mmol/L (ref 98–111)
Chloride: 102 mmol/L (ref 98–111)
Chloride: 105 mmol/L (ref 98–111)
Chloride: 107 mmol/L (ref 98–111)
Creatinine, Ser: 0.5 mg/dL (ref 0.44–1.00)
Creatinine, Ser: 0.5 mg/dL (ref 0.44–1.00)
Creatinine, Ser: 0.4 mg/dL — ABNORMAL LOW (ref 0.44–1.00)
Creatinine, Ser: 0.4 mg/dL — ABNORMAL LOW (ref 0.44–1.00)
Creatinine, Ser: 0.5 mg/dL (ref 0.44–1.00)
Creatinine, Ser: 0.6 mg/dL (ref 0.44–1.00)
Glucose, Bld: 102 mg/dL — ABNORMAL HIGH (ref 70–99)
Glucose, Bld: 107 mg/dL — ABNORMAL HIGH (ref 70–99)
Glucose, Bld: 130 mg/dL — ABNORMAL HIGH (ref 70–99)
Glucose, Bld: 89 mg/dL (ref 70–99)
Glucose, Bld: 119 mg/dL — ABNORMAL HIGH (ref 70–99)
Glucose, Bld: 131 mg/dL — ABNORMAL HIGH (ref 70–99)
HCT: 35 % — ABNORMAL LOW (ref 36.0–46.0)
HCT: 26 % — ABNORMAL LOW (ref 36.0–46.0)
HCT: 30 % — ABNORMAL LOW (ref 36.0–46.0)
HCT: 26 % — ABNORMAL LOW (ref 36.0–46.0)
HCT: 29 % — ABNORMAL LOW (ref 36.0–46.0)
HEMATOCRIT: 34 % — AB (ref 36.0–46.0)
HEMOGLOBIN: 8.8 g/dL — AB (ref 12.0–15.0)
Hemoglobin: 11.9 g/dL — ABNORMAL LOW (ref 12.0–15.0)
Hemoglobin: 11.6 g/dL — ABNORMAL LOW (ref 12.0–15.0)
Hemoglobin: 10.2 g/dL — ABNORMAL LOW (ref 12.0–15.0)
Hemoglobin: 8.8 g/dL — ABNORMAL LOW (ref 12.0–15.0)
Hemoglobin: 9.9 g/dL — ABNORMAL LOW (ref 12.0–15.0)
POTASSIUM: 4.1 mmol/L (ref 3.5–5.1)
POTASSIUM: 5.1 mmol/L (ref 3.5–5.1)
Potassium: 3.9 mmol/L (ref 3.5–5.1)
Potassium: 4 mmol/L (ref 3.5–5.1)
Potassium: 4.6 mmol/L (ref 3.5–5.1)
Potassium: 3.6 mmol/L (ref 3.5–5.1)
SODIUM: 140 mmol/L (ref 135–145)
Sodium: 142 mmol/L (ref 135–145)
Sodium: 137 mmol/L (ref 135–145)
Sodium: 139 mmol/L (ref 135–145)
Sodium: 138 mmol/L (ref 135–145)
Sodium: 140 mmol/L (ref 135–145)
TCO2: 24 mmol/L (ref 22–32)
TCO2: 25 mmol/L (ref 22–32)
TCO2: 26 mmol/L (ref 22–32)
TCO2: 26 mmol/L (ref 22–32)
TCO2: 24 mmol/L (ref 22–32)
TCO2: 24 mmol/L (ref 22–32)

## 2018-09-22 LAB — APTT: aPTT: 30 seconds (ref 24–36)

## 2018-09-22 LAB — COMPREHENSIVE METABOLIC PANEL WITH GFR
ALT: 21 U/L (ref 0–44)
AST: 22 U/L (ref 15–41)
Albumin: 3.4 g/dL — ABNORMAL LOW (ref 3.5–5.0)
Alkaline Phosphatase: 51 U/L (ref 38–126)
Anion gap: 10 (ref 5–15)
BUN: 21 mg/dL (ref 8–23)
CO2: 22 mmol/L (ref 22–32)
Calcium: 8.7 mg/dL — ABNORMAL LOW (ref 8.9–10.3)
Chloride: 108 mmol/L (ref 98–111)
Creatinine, Ser: 0.76 mg/dL (ref 0.44–1.00)
GFR calc Af Amer: >60 mL/min
GFR calc non Af Amer: >60 mL/min
Glucose, Bld: 101 mg/dL — ABNORMAL HIGH (ref 70–99)
Potassium: 3.9 mmol/L (ref 3.5–5.1)
Sodium: 140 mmol/L (ref 135–145)
Total Bilirubin: 0.4 mg/dL (ref 0.3–1.2)
Total Protein: 6 g/dL — ABNORMAL LOW (ref 6.5–8.1)

## 2018-09-22 LAB — GLUCOSE, CAPILLARY
Glucose-Capillary: 151 mg/dL — ABNORMAL HIGH (ref 70–99)
Glucose-Capillary: 131 mg/dL — ABNORMAL HIGH (ref 70–99)
Glucose-Capillary: 131 mg/dL — ABNORMAL HIGH (ref 70–99)
Glucose-Capillary: 130 mg/dL — ABNORMAL HIGH (ref 70–99)
Glucose-Capillary: 124 mg/dL — ABNORMAL HIGH (ref 70–99)
Glucose-Capillary: 134 mg/dL — ABNORMAL HIGH (ref 70–99)
Glucose-Capillary: 154 mg/dL — ABNORMAL HIGH (ref 70–99)
Glucose-Capillary: 128 mg/dL — ABNORMAL HIGH (ref 70–99)
Glucose-Capillary: 125 mg/dL — ABNORMAL HIGH (ref 70–99)
Glucose-Capillary: 140 mg/dL — ABNORMAL HIGH (ref 70–99)

## 2018-09-22 LAB — CBC
HCT: 34.7 % — ABNORMAL LOW (ref 36.0–46.0)
HCT: 32.4 % — ABNORMAL LOW (ref 36.0–46.0)
Hemoglobin: 11 g/dL — ABNORMAL LOW (ref 12.0–15.0)
Hemoglobin: 10.4 g/dL — ABNORMAL LOW (ref 12.0–15.0)
MCH: 31.6 pg (ref 26.0–34.0)
MCH: 32 pg (ref 26.0–34.0)
MCHC: 31.7 g/dL (ref 30.0–36.0)
MCHC: 32.1 g/dL (ref 30.0–36.0)
MCV: 99.7 fL (ref 80.0–100.0)
MCV: 99.7 fL (ref 80.0–100.0)
PLATELETS: 214 10*3/uL (ref 150–400)
PLATELETS: 197 10*3/uL (ref 150–400)
RBC: 3.48 MIL/uL — ABNORMAL LOW (ref 3.87–5.11)
RBC: 3.25 MIL/uL — AB (ref 3.87–5.11)
RDW: 11.2 % — ABNORMAL LOW (ref 11.5–15.5)
RDW: 11.2 % — ABNORMAL LOW (ref 11.5–15.5)
WBC: 15.5 10*3/uL — ABNORMAL HIGH (ref 4.0–10.5)
WBC: 14.5 10*3/uL — ABNORMAL HIGH (ref 4.0–10.5)
nRBC: 0 % (ref 0.0–0.2)
nRBC: 0 % (ref 0.0–0.2)

## 2018-09-22 LAB — POCT I-STAT 3, ART BLOOD GAS (G3+)
ACID-BASE DEFICIT: 4 mmol/L — AB (ref 0.0–2.0)
Acid-base deficit: 3 mmol/L — ABNORMAL HIGH (ref 0.0–2.0)
Acid-base deficit: 4 mmol/L — ABNORMAL HIGH (ref 0.0–2.0)
Bicarbonate: 24.8 mmol/L (ref 20.0–28.0)
Bicarbonate: 23 mmol/L (ref 20.0–28.0)
Bicarbonate: 21.8 mmol/L (ref 20.0–28.0)
Bicarbonate: 21.7 mmol/L (ref 20.0–28.0)
O2 Saturation: 100 %
O2 Saturation: 97 %
O2 Saturation: 93 %
O2 Saturation: 91 %
PH ART: 7.378 (ref 7.350–7.450)
Patient temperature: 36.1
Patient temperature: 37.8
Patient temperature: 37.7
TCO2: 26 mmol/L (ref 22–32)
TCO2: 24 mmol/L (ref 22–32)
TCO2: 23 mmol/L (ref 22–32)
TCO2: 23 mmol/L (ref 22–32)
pCO2 arterial: 42.2 mmHg (ref 32.0–48.0)
pCO2 arterial: 41.4 mmHg (ref 32.0–48.0)
pCO2 arterial: 41.2 mmHg (ref 32.0–48.0)
pCO2 arterial: 42.1 mmHg (ref 32.0–48.0)
pH, Arterial: 7.348 — ABNORMAL LOW (ref 7.350–7.450)
pH, Arterial: 7.336 — ABNORMAL LOW (ref 7.350–7.450)
pH, Arterial: 7.323 — ABNORMAL LOW (ref 7.350–7.450)
pO2, Arterial: 320 mmHg — ABNORMAL HIGH (ref 83.0–108.0)
pO2, Arterial: 93 mmHg (ref 83.0–108.0)
pO2, Arterial: 75 mmHg — ABNORMAL LOW (ref 83.0–108.0)
pO2, Arterial: 68 mmHg — ABNORMAL LOW (ref 83.0–108.0)

## 2018-09-22 LAB — POCT I-STAT 4, (NA,K, GLUC, HGB,HCT)
Glucose, Bld: 136 mg/dL — ABNORMAL HIGH (ref 70–99)
HCT: 32 % — ABNORMAL LOW (ref 36.0–46.0)
Hemoglobin: 10.9 g/dL — ABNORMAL LOW (ref 12.0–15.0)
POTASSIUM: 4 mmol/L (ref 3.5–5.1)
Sodium: 141 mmol/L (ref 135–145)

## 2018-09-22 LAB — HEMOGLOBIN A1C
Hgb A1c MFr Bld: 5 % (ref 4.8–5.6)
Mean Plasma Glucose: 96.8 mg/dL

## 2018-09-22 LAB — MAGNESIUM: MAGNESIUM: 3.1 mg/dL — AB (ref 1.7–2.4)

## 2018-09-22 LAB — PLATELET COUNT: Platelets: 186 10*3/uL (ref 150–400)

## 2018-09-22 LAB — CREATININE, SERUM
Creatinine, Ser: 0.79 mg/dL (ref 0.44–1.00)
GFR calc non Af Amer: >60 mL/min (ref >60)

## 2018-09-22 LAB — HEPARIN LEVEL (UNFRACTIONATED): Heparin Unfractionated: 0.27 IU/mL — ABNORMAL LOW (ref 0.30–0.70)

## 2018-09-22 LAB — PROTIME-INR
INR: 1.22
Prothrombin Time: 15.3 seconds — ABNORMAL HIGH (ref 11.4–15.2)

## 2018-09-22 LAB — HEMOGLOBIN AND HEMATOCRIT, BLOOD
HEMATOCRIT: 31.4 % — AB (ref 36.0–46.0)
Hemoglobin: 10.4 g/dL — ABNORMAL LOW (ref 12.0–15.0)

## 2018-09-22 SURGERY — CORONARY ARTERY BYPASS GRAFTING (CABG)
Anesthesia: General | Site: Chest

## 2018-09-22 MED ORDER — FAMOTIDINE IN NACL 20-0.9 MG/50ML-% IV SOLN
20.0000 mg | Freq: Two times a day (BID) | INTRAVENOUS | Status: AC
  Administered 2018-09-22 (×2): 20 mg via INTRAVENOUS
  Filled 2018-09-22: qty 50

## 2018-09-22 MED ORDER — ACETAMINOPHEN 500 MG PO TABS
1000.0000 mg | ORAL_TABLET | Freq: Four times a day (QID) | ORAL | Status: AC
  Administered 2018-09-22 – 2018-09-27 (×20): 1000 mg via ORAL
  Filled 2018-09-22 (×20): qty 2

## 2018-09-22 MED ORDER — DOCUSATE SODIUM 100 MG PO CAPS
200.0000 mg | ORAL_CAPSULE | Freq: Every day | ORAL | Status: DC
  Administered 2018-09-23 – 2018-09-28 (×6): 200 mg via ORAL
  Filled 2018-09-22 (×7): qty 2

## 2018-09-22 MED ORDER — DEXMEDETOMIDINE HCL IN NACL 400 MCG/100ML IV SOLN: 0.0000 ug/kg/h | INTRAVENOUS | Status: DC

## 2018-09-22 MED ORDER — SODIUM CHLORIDE (PF) 0.9 % IJ SOLN
INTRAMUSCULAR | Status: AC
  Filled 2018-09-22: qty 10

## 2018-09-22 MED ORDER — ORAL CARE MOUTH RINSE
15.0000 mL | Freq: Two times a day (BID) | OROMUCOSAL | Status: DC
  Administered 2018-09-22 – 2018-09-27 (×6): 15 mL via OROMUCOSAL

## 2018-09-22 MED ORDER — LACTATED RINGERS IV SOLN
INTRAVENOUS | Status: DC | PRN
  Administered 2018-09-22: 07:00:00 via INTRAVENOUS

## 2018-09-22 MED ORDER — GABAPENTIN 300 MG PO CAPS
600.0000 mg | ORAL_CAPSULE | Freq: Three times a day (TID) | ORAL | Status: DC
  Administered 2018-09-23 – 2018-09-28 (×16): 600 mg via ORAL
  Filled 2018-09-22 (×16): qty 2

## 2018-09-22 MED ORDER — MIDAZOLAM HCL 5 MG/5ML IJ SOLN
INTRAMUSCULAR | Status: DC | PRN
  Administered 2018-09-22 (×4): 2 mg via INTRAVENOUS

## 2018-09-22 MED ORDER — PROPOFOL 10 MG/ML IV BOLUS
INTRAVENOUS | Status: AC
  Filled 2018-09-22: qty 20

## 2018-09-22 MED ORDER — MIDAZOLAM HCL (PF) 10 MG/2ML IJ SOLN
INTRAMUSCULAR | Status: AC
  Filled 2018-09-22: qty 2

## 2018-09-22 MED ORDER — POTASSIUM CHLORIDE 10 MEQ/50ML IV SOLN
10.0000 meq | INTRAVENOUS | Status: AC
  Administered 2018-09-22 (×3): 10 meq via INTRAVENOUS

## 2018-09-22 MED ORDER — ASPIRIN EC 325 MG PO TBEC
325.0000 mg | DELAYED_RELEASE_TABLET | Freq: Every day | ORAL | Status: DC
  Administered 2018-09-23 – 2018-09-28 (×6): 325 mg via ORAL
  Filled 2018-09-22 (×6): qty 1

## 2018-09-22 MED ORDER — PROTAMINE SULFATE 10 MG/ML IV SOLN
INTRAVENOUS | Status: AC
  Filled 2018-09-22: qty 25

## 2018-09-22 MED ORDER — SODIUM CHLORIDE 0.9 % IV SOLN
INTRAVENOUS | Status: DC | PRN
  Administered 2018-09-22: 25 ug/min via INTRAVENOUS

## 2018-09-22 MED ORDER — PHENYLEPHRINE 40 MCG/ML (10ML) SYRINGE FOR IV PUSH (FOR BLOOD PRESSURE SUPPORT)
PREFILLED_SYRINGE | INTRAVENOUS | Status: AC
  Filled 2018-09-22: qty 10

## 2018-09-22 MED ORDER — FENTANYL CITRATE (PF) 250 MCG/5ML IJ SOLN
INTRAMUSCULAR | Status: AC
  Filled 2018-09-22: qty 25

## 2018-09-22 MED ORDER — ARTIFICIAL TEARS OPHTHALMIC OINT
TOPICAL_OINTMENT | OPHTHALMIC | Status: DC | PRN
  Administered 2018-09-22: 1 via OPHTHALMIC

## 2018-09-22 MED ORDER — MILRINONE LACTATE IN DEXTROSE 20-5 MG/100ML-% IV SOLN
0.1250 ug/kg/min | INTRAVENOUS | Status: DC
  Administered 2018-09-22 – 2018-09-23 (×2): 0.3 ug/kg/min via INTRAVENOUS
  Administered 2018-09-23: 0.125 ug/kg/min via INTRAVENOUS
  Filled 2018-09-22 (×3): qty 100

## 2018-09-22 MED ORDER — INSULIN REGULAR BOLUS VIA INFUSION
0.0000 [IU] | Freq: Three times a day (TID) | INTRAVENOUS | Status: DC
  Filled 2018-09-22: qty 10

## 2018-09-22 MED ORDER — ROCURONIUM BROMIDE 50 MG/5ML IV SOSY
PREFILLED_SYRINGE | INTRAVENOUS | Status: AC
  Filled 2018-09-22: qty 5

## 2018-09-22 MED ORDER — METOPROLOL TARTRATE 12.5 MG HALF TABLET: 12.5000 mg | ORAL_TABLET | Freq: Two times a day (BID) | ORAL | Status: DC

## 2018-09-22 MED ORDER — SODIUM CHLORIDE 0.9% FLUSH
3.0000 mL | Freq: Two times a day (BID) | INTRAVENOUS | Status: DC
  Administered 2018-09-23 – 2018-09-25 (×5): 3 mL via INTRAVENOUS

## 2018-09-22 MED ORDER — LACTATED RINGERS IV SOLN: INTRAVENOUS | Status: DC

## 2018-09-22 MED ORDER — ETOMIDATE 2 MG/ML IV SOLN
INTRAVENOUS | Status: AC
  Filled 2018-09-22: qty 10

## 2018-09-22 MED ORDER — TRAMADOL HCL 50 MG PO TABS
50.0000 mg | ORAL_TABLET | ORAL | Status: DC | PRN
  Administered 2018-09-23 (×2): 50 mg via ORAL
  Administered 2018-09-24 (×2): 100 mg via ORAL
  Filled 2018-09-22: qty 1
  Filled 2018-09-22 (×2): qty 2
  Filled 2018-09-22: qty 1

## 2018-09-22 MED ORDER — ALBUMIN HUMAN 5 % IV SOLN
250.0000 mL | INTRAVENOUS | Status: AC | PRN
  Administered 2018-09-22 (×2): 12.5 g via INTRAVENOUS

## 2018-09-22 MED ORDER — PHENYLEPHRINE 40 MCG/ML (10ML) SYRINGE FOR IV PUSH (FOR BLOOD PRESSURE SUPPORT)
PREFILLED_SYRINGE | INTRAVENOUS | Status: DC | PRN
  Administered 2018-09-22: 40 ug via INTRAVENOUS
  Administered 2018-09-22 (×2): 120 ug via INTRAVENOUS
  Administered 2018-09-22: 80 ug via INTRAVENOUS

## 2018-09-22 MED ORDER — POTASSIUM CHLORIDE 10 MEQ/50ML IV SOLN: 10.0000 meq | INTRAVENOUS | Status: AC

## 2018-09-22 MED ORDER — HEPARIN SODIUM (PORCINE) 1000 UNIT/ML IJ SOLN
INTRAMUSCULAR | Status: AC
  Filled 2018-09-22: qty 1

## 2018-09-22 MED ORDER — SODIUM CHLORIDE 0.9 % IV SOLN: INTRAVENOUS | Status: DC

## 2018-09-22 MED ORDER — SODIUM CHLORIDE 0.9 % IV SOLN: 250.0000 mL | INTRAVENOUS | Status: DC

## 2018-09-22 MED ORDER — METOPROLOL TARTRATE 5 MG/5ML IV SOLN: 2.5000 mg | INTRAVENOUS | Status: DC | PRN

## 2018-09-22 MED ORDER — ACETAMINOPHEN 160 MG/5ML PO SOLN: 650.0000 mg | Freq: Once | ORAL | Status: AC

## 2018-09-22 MED ORDER — DEXMEDETOMIDINE HCL IN NACL 400 MCG/100ML IV SOLN
INTRAVENOUS | Status: DC | PRN
  Administered 2018-09-22: 0.7 ug/kg/h via INTRAVENOUS

## 2018-09-22 MED ORDER — SODIUM CHLORIDE (PF) 0.9 % IJ SOLN
OROMUCOSAL | Status: DC | PRN
  Administered 2018-09-22 (×3): 4 mL via TOPICAL

## 2018-09-22 MED ORDER — ARTIFICIAL TEARS OPHTHALMIC OINT
TOPICAL_OINTMENT | OPHTHALMIC | Status: AC
  Filled 2018-09-22: qty 3.5

## 2018-09-22 MED ORDER — MORPHINE SULFATE (PF) 2 MG/ML IV SOLN
1.0000 mg | INTRAVENOUS | Status: DC | PRN
  Administered 2018-09-22 – 2018-09-23 (×8): 2 mg via INTRAVENOUS
  Filled 2018-09-22 (×8): qty 1

## 2018-09-22 MED ORDER — SODIUM CHLORIDE 0.9 % IV SOLN
INTRAVENOUS | Status: DC | PRN
  Administered 2018-09-22: 1 [IU]/h via INTRAVENOUS

## 2018-09-22 MED ORDER — METOPROLOL TARTRATE 25 MG/10 ML ORAL SUSPENSION: 12.5000 mg | Freq: Two times a day (BID) | ORAL | Status: DC

## 2018-09-22 MED ORDER — 0.9 % SODIUM CHLORIDE (POUR BTL) OPTIME
TOPICAL | Status: DC | PRN
  Administered 2018-09-22: 6000 mL

## 2018-09-22 MED ORDER — VANCOMYCIN HCL IN DEXTROSE 1-5 GM/200ML-% IV SOLN
1000.0000 mg | Freq: Once | INTRAVENOUS | Status: AC
  Administered 2018-09-22: 1000 mg via INTRAVENOUS
  Filled 2018-09-22: qty 200

## 2018-09-22 MED ORDER — SODIUM CHLORIDE 0.9% FLUSH: 3.0000 mL | INTRAVENOUS | Status: DC | PRN

## 2018-09-22 MED ORDER — LACTATED RINGERS IV SOLN
INTRAVENOUS | Status: DC | PRN
  Administered 2018-09-22: 06:00:00 via INTRAVENOUS

## 2018-09-22 MED ORDER — HEPARIN SODIUM (PORCINE) 1000 UNIT/ML IJ SOLN
INTRAMUSCULAR | Status: DC | PRN
  Administered 2018-09-22: 27000 [IU] via INTRAVENOUS
  Administered 2018-09-22: 2000 [IU] via INTRAVENOUS

## 2018-09-22 MED ORDER — NOREPINEPHRINE BITARTRATE 1 MG/ML IV SOLN
INTRAVENOUS | Status: DC | PRN
  Administered 2018-09-22: 3 ug/min via INTRAVENOUS

## 2018-09-22 MED ORDER — HEMOSTATIC AGENTS (NO CHARGE) OPTIME
TOPICAL | Status: DC | PRN
  Administered 2018-09-22: 1 via TOPICAL

## 2018-09-22 MED ORDER — ONDANSETRON HCL 4 MG/2ML IJ SOLN: 4.0000 mg | Freq: Four times a day (QID) | INTRAMUSCULAR | Status: DC | PRN

## 2018-09-22 MED ORDER — PROTAMINE SULFATE 10 MG/ML IV SOLN
INTRAVENOUS | Status: AC
  Filled 2018-09-22: qty 5

## 2018-09-22 MED ORDER — SODIUM CHLORIDE 0.9 % IV SOLN
1.5000 g | Freq: Two times a day (BID) | INTRAVENOUS | Status: AC
  Administered 2018-09-22 – 2018-09-24 (×4): 1.5 g via INTRAVENOUS
  Filled 2018-09-22 (×4): qty 1.5

## 2018-09-22 MED ORDER — NITROGLYCERIN IN D5W 200-5 MCG/ML-% IV SOLN: 0.0000 ug/min | INTRAVENOUS | Status: DC

## 2018-09-22 MED ORDER — INSULIN REGULAR(HUMAN) IN NACL 100-0.9 UT/100ML-% IV SOLN: INTRAVENOUS | Status: DC

## 2018-09-22 MED ORDER — ATORVASTATIN CALCIUM 80 MG PO TABS
80.0000 mg | ORAL_TABLET | Freq: Every day | ORAL | Status: DC
  Administered 2018-09-23 – 2018-09-27 (×5): 80 mg via ORAL
  Filled 2018-09-22 (×5): qty 1

## 2018-09-22 MED ORDER — MIDAZOLAM HCL 2 MG/2ML IJ SOLN: 2.0000 mg | INTRAMUSCULAR | Status: DC | PRN

## 2018-09-22 MED ORDER — PHENYLEPHRINE HCL-NACL 20-0.9 MG/250ML-% IV SOLN
0.0000 ug/min | INTRAVENOUS | Status: DC
  Administered 2018-09-22: 45 ug/min via INTRAVENOUS
  Administered 2018-09-23: 5 ug/min via INTRAVENOUS
  Filled 2018-09-22 (×2): qty 250

## 2018-09-22 MED ORDER — PROTAMINE SULFATE 10 MG/ML IV SOLN
INTRAVENOUS | Status: DC | PRN
  Administered 2018-09-22: 20 mg via INTRAVENOUS
  Administered 2018-09-22: 250 mg via INTRAVENOUS

## 2018-09-22 MED ORDER — ASPIRIN 81 MG PO CHEW: 324.0000 mg | CHEWABLE_TABLET | Freq: Every day | ORAL | Status: DC

## 2018-09-22 MED ORDER — ACETAMINOPHEN 160 MG/5ML PO SOLN: 1000.0000 mg | Freq: Four times a day (QID) | ORAL | Status: AC

## 2018-09-22 MED ORDER — NOREPINEPHRINE BITARTRATE 1 MG/ML IV SOLN
INTRAVENOUS | Status: DC | PRN
  Administered 2018-09-22 (×3): 1 mL via INTRAVENOUS
  Administered 2018-09-22: .5 mL via INTRAVENOUS
  Administered 2018-09-22 (×2): 1 mL via INTRAVENOUS

## 2018-09-22 MED ORDER — LACTATED RINGERS IV SOLN: 500.0000 mL | Freq: Once | INTRAVENOUS | Status: DC | PRN

## 2018-09-22 MED ORDER — BISACODYL 5 MG PO TBEC
10.0000 mg | DELAYED_RELEASE_TABLET | Freq: Every day | ORAL | Status: DC
  Administered 2018-09-23 – 2018-09-28 (×6): 10 mg via ORAL
  Filled 2018-09-22 (×6): qty 2

## 2018-09-22 MED ORDER — NOREPINEPHRINE 4 MG/250ML-% IV SOLN
0.0000 ug/min | INTRAVENOUS | Status: DC
  Administered 2018-09-22: 8 ug/min via INTRAVENOUS
  Administered 2018-09-23: 9 ug/min via INTRAVENOUS
  Filled 2018-09-22 (×2): qty 250

## 2018-09-22 MED ORDER — ETOMIDATE 2 MG/ML IV SOLN
INTRAVENOUS | Status: DC | PRN
  Administered 2018-09-22: 18 mg via INTRAVENOUS

## 2018-09-22 MED ORDER — MAGNESIUM SULFATE 4 GM/100ML IV SOLN
4.0000 g | Freq: Once | INTRAVENOUS | Status: AC
  Administered 2018-09-22: 4 g via INTRAVENOUS
  Filled 2018-09-22: qty 100

## 2018-09-22 MED ORDER — FENTANYL CITRATE (PF) 250 MCG/5ML IJ SOLN
INTRAMUSCULAR | Status: DC | PRN
  Administered 2018-09-22: 150 ug via INTRAVENOUS
  Administered 2018-09-22: 100 ug via INTRAVENOUS
  Administered 2018-09-22: 50 ug via INTRAVENOUS
  Administered 2018-09-22 (×2): 100 ug via INTRAVENOUS
  Administered 2018-09-22 (×2): 50 ug via INTRAVENOUS
  Administered 2018-09-22 (×2): 100 ug via INTRAVENOUS
  Administered 2018-09-22: 250 ug via INTRAVENOUS
  Administered 2018-09-22 (×2): 100 ug via INTRAVENOUS

## 2018-09-22 MED ORDER — OXYCODONE HCL 5 MG PO TABS
5.0000 mg | ORAL_TABLET | ORAL | Status: DC | PRN
  Administered 2018-09-24 – 2018-09-25 (×2): 5 mg via ORAL
  Administered 2018-09-26 – 2018-09-27 (×3): 10 mg via ORAL
  Administered 2018-09-28: 5 mg via ORAL
  Filled 2018-09-22: qty 1
  Filled 2018-09-22: qty 2
  Filled 2018-09-22 (×2): qty 1
  Filled 2018-09-22 (×2): qty 2

## 2018-09-22 MED ORDER — PANTOPRAZOLE SODIUM 40 MG PO TBEC
40.0000 mg | DELAYED_RELEASE_TABLET | Freq: Every day | ORAL | Status: DC
  Administered 2018-09-24 – 2018-09-28 (×5): 40 mg via ORAL
  Filled 2018-09-22 (×5): qty 1

## 2018-09-22 MED ORDER — SODIUM CHLORIDE 0.45 % IV SOLN
INTRAVENOUS | Status: DC | PRN
  Administered 2018-09-22: 20 mL/h via INTRAVENOUS

## 2018-09-22 MED ORDER — BISACODYL 10 MG RE SUPP: 10.0000 mg | Freq: Every day | RECTAL | Status: DC

## 2018-09-22 MED ORDER — DULOXETINE HCL 60 MG PO CPEP
60.0000 mg | ORAL_CAPSULE | Freq: Two times a day (BID) | ORAL | Status: DC
  Administered 2018-09-23 – 2018-09-28 (×11): 60 mg via ORAL
  Filled 2018-09-22 (×11): qty 1

## 2018-09-22 MED ORDER — PLASMA-LYTE 148 IV SOLN
INTRAVENOUS | Status: DC | PRN
  Administered 2018-09-22: 500 mL via INTRAVASCULAR

## 2018-09-22 MED ORDER — ACETAMINOPHEN 650 MG RE SUPP
650.0000 mg | Freq: Once | RECTAL | Status: AC
  Administered 2018-09-22: 650 mg via RECTAL

## 2018-09-22 MED ORDER — ROCURONIUM BROMIDE 50 MG/5ML IV SOSY
PREFILLED_SYRINGE | INTRAVENOUS | Status: AC
  Filled 2018-09-22: qty 15

## 2018-09-22 MED ORDER — VANCOMYCIN HCL 1000 MG IV SOLR
INTRAVENOUS | Status: DC | PRN
  Administered 2018-09-22: 1000 mg via INTRAVENOUS

## 2018-09-22 MED ORDER — ROCURONIUM BROMIDE 10 MG/ML (PF) SYRINGE
PREFILLED_SYRINGE | INTRAVENOUS | Status: DC | PRN
  Administered 2018-09-22: 100 mg via INTRAVENOUS
  Administered 2018-09-22 (×2): 50 mg via INTRAVENOUS

## 2018-09-22 MED ORDER — CHLORHEXIDINE GLUCONATE 0.12 % MT SOLN
15.0000 mL | OROMUCOSAL | Status: AC
  Administered 2018-09-22: 15 mL via OROMUCOSAL

## 2018-09-22 SURGICAL SUPPLY — 89 items
ADAPTER CARDIO PERF ANTE/RETRO (ADAPTER) ×2 IMPLANT
ADH SKN CLS APL DERMABOND .7 (GAUZE/BANDAGES/DRESSINGS) ×2
ADPR PRFSN 84XANTGRD RTRGD (ADAPTER) ×2
BAG DECANTER FOR FLEXI CONT (MISCELLANEOUS) ×4 IMPLANT
BANDAGE ACE 4X5 VEL STRL LF (GAUZE/BANDAGES/DRESSINGS) ×4 IMPLANT
BANDAGE ACE 6X5 VEL STRL LF (GAUZE/BANDAGES/DRESSINGS) ×4 IMPLANT
BASKET HEART  (ORDER IN 25'S) (MISCELLANEOUS) ×1
BASKET HEART (ORDER IN 25'S) (MISCELLANEOUS) ×1
BASKET HEART (ORDER IN 25S) (MISCELLANEOUS) ×2 IMPLANT
BLADE STERNUM SYSTEM 6 (BLADE) ×4 IMPLANT
BLADE SURG 11 STRL SS (BLADE) ×2 IMPLANT
BNDG GAUZE ELAST 4 BULKY (GAUZE/BANDAGES/DRESSINGS) ×4 IMPLANT
CANISTER SUCT 3000ML PPV (MISCELLANEOUS) ×4 IMPLANT
CANNULA EZ GLIDE AORTIC 21FR (CANNULA) ×4 IMPLANT
CANNULA GUNDRY RCSP 15FR (MISCELLANEOUS) ×2 IMPLANT
CATH CPB KIT HENDRICKSON (MISCELLANEOUS) ×4 IMPLANT
CATH ROBINSON RED A/P 18FR (CATHETERS) ×4 IMPLANT
CATH THORACIC 36FR (CATHETERS) ×4 IMPLANT
CATH THORACIC 36FR RT ANG (CATHETERS) ×4 IMPLANT
CLIP VESOCCLUDE MED 24/CT (CLIP) IMPLANT
CLIP VESOCCLUDE SM WIDE 24/CT (CLIP) ×4 IMPLANT
COVER WAND RF STERILE (DRAPES) ×2 IMPLANT
CRADLE DONUT ADULT HEAD (MISCELLANEOUS) ×4 IMPLANT
DERMABOND ADVANCED (GAUZE/BANDAGES/DRESSINGS) ×2
DERMABOND ADVANCED .7 DNX12 (GAUZE/BANDAGES/DRESSINGS) IMPLANT
DRAPE CARDIOVASCULAR INCISE (DRAPES) ×4
DRAPE SLUSH/WARMER DISC (DRAPES) ×4 IMPLANT
DRAPE SRG 135X102X78XABS (DRAPES) ×2 IMPLANT
DRSG COVADERM 4X14 (GAUZE/BANDAGES/DRESSINGS) ×4 IMPLANT
ELECT REM PT RETURN 9FT ADLT (ELECTROSURGICAL) ×8
ELECTRODE REM PT RTRN 9FT ADLT (ELECTROSURGICAL) ×4 IMPLANT
FELT TEFLON 1X6 (MISCELLANEOUS) ×6 IMPLANT
GAUZE SPONGE 4X4 12PLY STRL (GAUZE/BANDAGES/DRESSINGS) ×8 IMPLANT
GLOVE SURG SIGNA 7.5 PF LTX (GLOVE) ×12 IMPLANT
GOWN STRL REUS W/ TWL LRG LVL3 (GOWN DISPOSABLE) ×8 IMPLANT
GOWN STRL REUS W/ TWL XL LVL3 (GOWN DISPOSABLE) ×4 IMPLANT
GOWN STRL REUS W/TWL LRG LVL3 (GOWN DISPOSABLE) ×16
GOWN STRL REUS W/TWL XL LVL3 (GOWN DISPOSABLE) ×8
HEMOSTAT POWDER SURGIFOAM 1G (HEMOSTASIS) ×12 IMPLANT
HEMOSTAT SURGICEL 2X14 (HEMOSTASIS) ×4 IMPLANT
INSERT FOGARTY XLG (MISCELLANEOUS) IMPLANT
KIT BASIN OR (CUSTOM PROCEDURE TRAY) ×4 IMPLANT
KIT SUCTION CATH 14FR (SUCTIONS) ×8 IMPLANT
KIT TURNOVER KIT B (KITS) ×4 IMPLANT
KIT VASOVIEW HEMOPRO 2 VH 4000 (KITS) ×4 IMPLANT
MARKER GRAFT CORONARY BYPASS (MISCELLANEOUS) ×12 IMPLANT
NS IRRIG 1000ML POUR BTL (IV SOLUTION) ×22 IMPLANT
PACK E OPEN HEART (SUTURE) ×4 IMPLANT
PACK OPEN HEART (CUSTOM PROCEDURE TRAY) ×4 IMPLANT
PAD ARMBOARD 7.5X6 YLW CONV (MISCELLANEOUS) ×8 IMPLANT
PAD ELECT DEFIB RADIOL ZOLL (MISCELLANEOUS) ×4 IMPLANT
PENCIL BUTTON HOLSTER BLD 10FT (ELECTRODE) ×6 IMPLANT
PUNCH AORTIC ROTATE  4.5MM 8IN (MISCELLANEOUS) ×2 IMPLANT
PUNCH AORTIC ROTATE 4.0MM (MISCELLANEOUS) IMPLANT
PUNCH AORTIC ROTATE 4.5MM 8IN (MISCELLANEOUS) IMPLANT
PUNCH AORTIC ROTATE 5MM 8IN (MISCELLANEOUS) IMPLANT
SET CARDIOPLEGIA MPS 5001102 (MISCELLANEOUS) ×2 IMPLANT
SOLUTION ANTI FOG 6CC (MISCELLANEOUS) ×2 IMPLANT
SUT BONE WAX W31G (SUTURE) ×4 IMPLANT
SUT MNCRL AB 4-0 PS2 18 (SUTURE) ×2 IMPLANT
SUT PROLENE 3 0 SH DA (SUTURE) ×4 IMPLANT
SUT PROLENE 4 0 RB 1 (SUTURE) ×4
SUT PROLENE 4 0 SH DA (SUTURE) IMPLANT
SUT PROLENE 4-0 RB1 .5 CRCL 36 (SUTURE) IMPLANT
SUT PROLENE 6 0 C 1 30 (SUTURE) ×8 IMPLANT
SUT PROLENE 7 0 BV 1 (SUTURE) ×2 IMPLANT
SUT PROLENE 7 0 BV1 MDA (SUTURE) ×6 IMPLANT
SUT PROLENE 8 0 BV175 6 (SUTURE) ×2 IMPLANT
SUT STEEL 6MS V (SUTURE) ×4 IMPLANT
SUT STEEL STERNAL CCS#1 18IN (SUTURE) IMPLANT
SUT STEEL SZ 6 DBL 3X14 BALL (SUTURE) ×4 IMPLANT
SUT VIC AB 1 CTX 36 (SUTURE) ×8
SUT VIC AB 1 CTX36XBRD ANBCTR (SUTURE) ×4 IMPLANT
SUT VIC AB 2-0 CT1 27 (SUTURE) ×4
SUT VIC AB 2-0 CT1 TAPERPNT 27 (SUTURE) IMPLANT
SUT VIC AB 2-0 CTX 27 (SUTURE) IMPLANT
SUT VIC AB 3-0 SH 27 (SUTURE)
SUT VIC AB 3-0 SH 27X BRD (SUTURE) IMPLANT
SUT VIC AB 3-0 X1 27 (SUTURE) IMPLANT
SUT VICRYL 4-0 PS2 18IN ABS (SUTURE) IMPLANT
SYSTEM SAHARA CHEST DRAIN ATS (WOUND CARE) ×4 IMPLANT
TAPE CLOTH SURG 4X10 WHT LF (GAUZE/BANDAGES/DRESSINGS) ×2 IMPLANT
TAPE PAPER 2X10 WHT MICROPORE (GAUZE/BANDAGES/DRESSINGS) ×2 IMPLANT
TOWEL GREEN STERILE (TOWEL DISPOSABLE) ×4 IMPLANT
TOWEL GREEN STERILE FF (TOWEL DISPOSABLE) ×4 IMPLANT
TRAY FOLEY SLVR 16FR TEMP STAT (SET/KITS/TRAYS/PACK) ×4 IMPLANT
TUBING INSUFFLATION (TUBING) ×4 IMPLANT
UNDERPAD 30X30 (UNDERPADS AND DIAPERS) ×4 IMPLANT
WATER STERILE IRR 1000ML POUR (IV SOLUTION) ×8 IMPLANT

## 2018-09-22 NOTE — Anesthesia Preprocedure Evaluation (Signed)
Anesthesia Evaluation  Patient identified by MRN, date of birth, ID band Patient awake    Reviewed: Allergy & Precautions, NPO status , Patient's Chart, lab work & pertinent test results, reviewed documented beta blocker date and time   Airway Mallampati: II  TM Distance: >3 FB Neck ROM: Full    Dental  (+) Teeth Intact, Dental Advisory Given   Pulmonary sleep apnea , former smoker,    Pulmonary exam normal breath sounds clear to auscultation       Cardiovascular hypertension, Pt. on home beta blockers and Pt. on medications + angina with exertion + CAD, + Past MI and +CHF  Normal cardiovascular exam Rhythm:Regular Rate:Normal  Echo 09/07/18: Study Conclusions  - Left ventricle: The cavity size was moderately dilated. Wall thickness was normal. Systolic function was severely reduced. The estimated ejection fraction was in the range of 20% to 25%. Regional wall motion abnormality: Akinesis of the basal-mid anteroseptal myocardium. - Aortic valve: Mildly calcified annulus. Trileaflet; mildly   thickened leaflets. Valve area (VTI): 0.94 cm^2. Valve area (Vmax): 1.05 cm^2. Valve area (Vmean): 0.92 cm^2. - Mitral valve: Mildly calcified annulus. Mildly thickened leaflets. There was mild regurgitation. - Left atrium: The atrium was severely dilated. - Atrial septum: No defect or patent foramen ovale was identified.   Neuro/Psych negative neurological ROS     GI/Hepatic Neg liver ROS, GERD  ,  Endo/Other  Obesity   Renal/GU negative Renal ROS     Musculoskeletal  (+) Arthritis , Fibromyalgia -  Abdominal   Peds  Hematology negative hematology ROS (+)   Anesthesia Other Findings Day of surgery medications reviewed with the patient.  Reproductive/Obstetrics                           Anesthesia Physical Anesthesia Plan  ASA: IV  Anesthesia Plan: General   Post-op Pain Management:     Induction: Intravenous  PONV Risk Score and Plan: 3 and Treatment may vary due to age or medical condition  Airway Management Planned: Oral ETT  Additional Equipment: Arterial line, CVP, PA Cath, TEE and Ultrasound Guidance Line Placement  Intra-op Plan:   Post-operative Plan: Post-operative intubation/ventilation  Informed Consent: I have reviewed the patients History and Physical, chart, labs and discussed the procedure including the risks, benefits and alternatives for the proposed anesthesia with the patient or authorized representative who has indicated his/her understanding and acceptance.   Dental advisory given  Plan Discussed with: CRNA  Anesthesia Plan Comments:         Anesthesia Quick Evaluation

## 2018-09-22 NOTE — Brief Op Note (Signed)
09/21/2018 - 09/22/2018  4:09 PM  PATIENT:  Jennifer [name]  62 y.o. female  PRE-OPERATIVE DIAGNOSIS:  SEVERE 2 VESSEL CAD with NON-ISCHEMIC CARDIOMYOPATHY  POST-OPERATIVE DIAGNOSIS:  SEVERE 2 VESSEL CAD with NON-ISCHEMIC CARDIOMYOPATHY  PROCEDURE:  Procedure(s): CORONARY ARTERY BYPASS GRAFTING (CABG), ON PUMP, TIMES TWO, USING LEFT INTERNAL MAMMARY ARTERY AND ENDOSCOPICALLY HARVESTED RIGHT GREATER SAPHENOUS VEIN (N/A) TRANSESOPHAGEAL ECHOCARDIOGRAM (TEE) (N/A)  LIMA to LAD SVG to OM1  SURGEON:  Surgeon(s) and Role:    * Melrose Nakayama, MD - Primary  PHYSICIAN ASSISTANT:  Nicholes Rough, PA-C   ANESTHESIA:   general  EBL:  475 mL   BLOOD ADMINISTERED:none  DRAINS: ROUTINE   LOCAL MEDICATIONS USED:  NONE  SPECIMEN:  No Specimen  DISPOSITION OF SPECIMEN:  N/A  COUNTS:  YES  DICTATION: .Dragon Dictation  PLAN OF CARE: Admit to inpatient   PATIENT DISPOSITION:  ICU - intubated and hemodynamically stable.   Delay start of Pharmacological VTE agent (>24hrs) due to surgical blood loss or risk of bleeding: yes  Vein fair quality, LIMA and targets good quality Severe LV systolic dysfunction with EF 20-25%, mild MR, no significant AS

## 2018-09-22 NOTE — Op Note (Signed)
NAME: Jennifer [name], Jennifer [name] MEDICAL RECORD TM:[id number] ACCOUNT [id number] DATE OF BIRTH:07/04/1956 FACILITY: [facility] LOCATION: [facility] PHYSICIAN:Shericka Johnstone Chaya Jan, MD  OPERATIVE REPORT  DATE OF PROCEDURE:  09/22/2018  PREOPERATIVE DIAGNOSIS:  Severe 2-vessel coronary disease with class 3 angina and nonischemic cardiomyopathy.  POSTOPERATIVE DIAGNOSIS:  Severe 2-vessel coronary disease with class angina and nonischemic cardiomyopathy.  PROCEDURE:   Median sternotomy, extracorporeal circulation, Coronary artery bypass grafting x 2  Left internal mammary artery to left anterior descending,  Saphenous vein graft to obtuse marginal 1  SURGEON:  Modesto Charon, MD  ASSISTANT:  Nicholes Rough, PA  ANESTHESIA:  General.  FINDINGS:   1. Transesophageal echocardiography revealed severe left ventricular dysfunction with ejection fraction of 25%.  There was mild mitral regurgitation, no significant aortic stenosis.  Post-bypass slight improvement in wall motion with inotropic  support.   2. Intraoperative findings- Cardiomegaly.  Good quality targets.  LIMA good quality.  Vein fair quality.  CLINICAL NOTE:  Jennifer [name] is a 62 year old woman with a history of nonischemic cardiomyopathy.  She presented with progressive anginal symptoms.  She now has a class 3 angina.  She underwent cardiac catheterization where she was found to have severe  2-vessel coronary disease with a 90% ostial LAD stenosis and a 50% ostial circumflex lesion.  There was no significant disease in the small right coronary.  Echocardiography revealed severe left ventricular systolic dysfunction with ejection fraction of  20-25%.  She was advised to undergo coronary artery bypass grafting.  The indications, risks, benefits, and alternatives were discussed in detail with the patient.  She understood and accepted the risks and agreed to proceed.  DESCRIPTION OF PROCEDURE:  Jennifer [name] was brought to the preoperative holding area  on 09/22/2018.  Anesthesia placed a Swan-Ganz catheter and an arterial blood pressure monitoring line.  PA pressures were approximately 2/3 of systemic.  She was taken to  the Operating Room, anesthetized and intubated.  A Foley catheter was placed.  Intravenous antibiotics were administered.  The chest, abdomen and legs were prepped and draped in the usual sterile fashion.  Dr. Hoy Morn of Anesthesia performed  transesophageal echocardiography.  Please refer to his separately dictated note for full details of the procedure.  A timeout was performed.  An incision was made in the medial aspect of the right leg just above the knee.  Greater saphenous vein was identified and was harvested from the right thigh endoscopically.  The saphenous vein was relatively small and fair quality,  but was suitable for use as a graft.  Simultaneously with the vein harvest, a median sternotomy was performed and the left internal mammary artery was harvested using standard technique.  The mammary artery was relatively small, but had excellent flow  when divided distally.  Two thousand units of heparin were administered during the vessel harvest.  The remainder of a full heparin dose was given prior to opening the pericardium.  The pericardium was opened.  The ascending aorta was inspected.  It was of normal caliber with no evidence of atherosclerotic disease.  The aorta was cannulated via concentric 2-0 Ethibond pledgeted pursestring sutures.  A dual stage venous cannula was  placed via a pursestring suture in the right atrial appendage.  After confirming adequate anticoagulation with ACT measurement, cardiopulmonary bypass was initiated.  Flows were maintained per protocol.  The patient was cooled to 34 degrees Celsius.   The coronary arteries were inspected and anastomotic sites were chosen.  A retrograde cardioplegia cannula was placed via a  pursestring suture in the right atrium and directed into the coronary sinus.   A foam pad was placed in the pericardium to insulate  the heart.  A temperature probe was placed in the myocardial septum and a cardioplegia cannula was placed in the ascending aorta.  The aorta was cross clamped.  The left ventricle was emptied via the aortic root vent.  Cardiac arrest was achieved with a combination of cold antegrade and retrograde blood cardioplegia and topical iced saline.  An initial 500 mL of cardioplegia was  given antegrade.  There was a rapid diastolic arrest.  A liter of cardioplegia was given retrograde.  There was septal cooling to 10 degrees Celsius.  A reversed saphenous vein graft was placed end-to-side to OM1.  OM1 was a 2 mm good quality target.  The vein was of fair quality.  The end-to-side anastomosis was performed with a running 7-0 Prolene suture.  A probe passed easily proximally and  distally.  At the completion of the anastomosis, cardioplegia was administered down the graft.  There was good flow and good hemostasis.  The left internal mammary artery was brought through a window in the pericardium.  The distal limb was bevelled.  It was anastomosed end-to-side to the distal LAD.  The LAD was a 1.5 mm good quality target at the site of anastomosis.  The mammary was a  1.5 mm thin walled good quality conduit.  The end-to-side anastomosis was performed with a running 8-0 Prolene suture.  At the completion of the mammary to LAD anastomosis, the bulldog clamp was briefly removed to inspect for hemostasis.  Rapid septal  rewarming was noted and the bulldog clamp was replaced.  The mammary pedicle was tacked to the epicardial surface of the heart with 6-0 Prolene sutures.  Additional cardioplegia was administered down the vein graft.  It was then cut to length.  The cardioplegia cannula was removed from the ascending aorta.  The proximal vein graft anastomosis was performed to a 4.0 mm punch aortotomy with a running 6-0  Prolene suture.  At the completion of the  proximal anastomosis, the patient was placed in Trendelenburg position.  Lidocaine was administered.  The aortic root was de-aired and the aortic crossclamp was removed.  The total crossclamp time was 44 minutes.   The patient required 2 defibrillations and then was in a bradycardic rhythm thereafter.  While rewarming was completed, all proximal and distal anastomoses were inspected for hemostasis.  Epicardial pacing wires were placed on the right ventricle and  right atrium.  DDD pacing was initiated.  The patient was given a loading dose of milrinone and then started on infusion at 0.25 mcg/kg/min.  When the patient had rewarmed to a core temperature of 37 degrees Celsius, she was weaned from cardiopulmonary  bypass on the first attempt.  Initially after weaning from bypass, both systolic and pulmonary artery pressures were in the 70s.  The milrinone was increased to 0.375 mcg/kg/min and a Norepinephrine infusion was initiated.  Gradually over the next 10-15  minutes the systemic pressures rose to normal and the pulmonary artery pressures decreased into the 50/30 range.  There was improved left ventricular wall motion on the echocardiogram during this time.  A test dose of protamine was administered and was  well tolerated.  The atrial and aortic cannulae were removed.  The remainder of the protamine was administered without incident.  The chest was irrigated with warm saline.  Hemostasis was achieved.  The pericardium was not  closed.  Left pleural and  mediastinal chest tubes were placed through separate subcostal incisions.  The sternum was closed with a combination of single and double heavy gauge stainless steel wires.  Pectoralis fascia, subcutaneous tissue and skin were closed in standard fashion.   All sponge, needle and instrument counts were correct at the end of the procedure.  The patient was taken from the operating room to the surgical Intensive Care Unit, intubated and in stable  condition.  TN/NUANCE  D:09/22/2018 T:09/22/2018 JOB:[id number]

## 2018-09-22 NOTE — Anesthesia Postprocedure Evaluation (Signed)
Anesthesia Post Note  Patient: LENORE MOYANO  Procedure(s) Performed: CORONARY ARTERY BYPASS GRAFTING (CABG), ON PUMP, TIMES TWO, USING LEFT INTERNAL MAMMARY ARTERY AND ENDOSCOPICALLY HARVESTED RIGHT GREATER SAPHENOUS VEIN (N/A Chest) TRANSESOPHAGEAL ECHOCARDIOGRAM (TEE) (N/A )     Patient location during evaluation: SICU Anesthesia Type: General Level of consciousness: sedated and patient remains intubated per anesthesia plan Pain management: pain level controlled Vital Signs Assessment: post-procedure vital signs reviewed and stable Respiratory status: patient remains intubated per anesthesia plan Cardiovascular status: BP support with milrinone, norepinephrine infusions. Postop Assessment: no apparent nausea or vomiting Anesthetic complications: no    Last Vitals:  Vitals:   09/22/18 1400 09/22/18 1500  BP: (!) 86/53 (!) 74/60  Pulse: 91 91  Resp: 16 16  Temp: 37 C 37.5 C  SpO2: 100% 98%    Last Pain:  Vitals:   09/22/18 0400  TempSrc:   PainSc: 0-No pain                 Catalina Gravel

## 2018-09-22 NOTE — Procedures (Signed)
Extubation Procedure Note  Patient Details:   Name: Jennifer [name] DOB: 20-Jul-1956 MRN: [id number]   Airway Documentation:    Vent end date: 09/22/18 Vent end time: 1645   Evaluation  O2 sats: stable throughout Complications: No apparent complications Patient did tolerate procedure well. Bilateral Breath Sounds: Clear   Yes   Patient was extubated to 4L  without any complications, dyspnea or stridor noted. Patient was instructed on IS x 3, highest goal reached was 630mL. VC: 746mL, NIF: -20.  [name] 09/22/2018, 4:45 PM

## 2018-09-22 NOTE — OR Nursing (Signed)
Forty-five minute call to 2 Heart Charge nurse at 1107. Spoke with [employer].

## 2018-09-22 NOTE — Anesthesia Procedure Notes (Signed)
Arterial Line Insertion Start/End12/03/2018 6:41 AM, 09/22/2018 6:46 AM Performed by: Catalina Gravel, MD, Bryson Corona, CRNA, CRNA  Patient location: Pre-op. Preanesthetic checklist: patient identified, IV checked, site marked, risks and benefits discussed, surgical consent, monitors and equipment checked, pre-op evaluation, timeout performed and anesthesia consent Lidocaine 1% used for infiltration Left, radial was placed Catheter size: 20 Fr Hand hygiene performed  and maximum sterile barriers used   Attempts: 1 Procedure performed without using ultrasound guided technique. Following insertion, dressing applied. Post procedure assessment: normal and unchanged  Patient tolerated the procedure well with no immediate complications.

## 2018-09-22 NOTE — Progress Notes (Signed)
  Echocardiogram Echocardiogram Transesophageal has been performed.  Jennifer [name] M 09/22/2018, 8:28 AM

## 2018-09-22 NOTE — Progress Notes (Signed)
[place] for Heparin Indication: chest pain/ACS, s/p cath, CABG 12/6 AM  Allergies  Allergen Reactions  . Poison Oak Extract Rash    Patient Measurements: Height: 5\' 2"  (157.5 cm) Weight: 209 lb 7 oz (95 kg) IBW/kg (Calculated) : 50.1 Heparin Dosing Weight: 68kg  Vital Signs: Temp: 98.6 F (37 C) (12/06 0000) Temp Source: Oral (12/06 0000) BP: 104/60 (12/06 0100) Pulse Rate: 84 (12/06 0100)  Labs: Recent Labs    09/19/18 1422 09/21/18 1324 09/22/18 0020  HGB 13.1  --   --   HCT 39.6  --   --   PLT 248  --   --   LABPROT 10.3 13.0  --   INR 1.0 0.99  --   HEPARINUNFRC  --   --  0.27*  CREATININE 0.99  --  0.76    Estimated Creatinine Clearance: 78.4 mL/min (by C-G formula based on SCr of 0.76 mg/dL).   Medical History: Past Medical History:  Diagnosis Date  . Arthritis    "probably; maybe in my hands" (09/21/2018)  . Congestive heart failure (CHF) ([place])   . Fibromyalgia   . GERD (gastroesophageal reflux disease)   . Hypercholesteremia   . Hypertension   . IBS (irritable bowel syndrome)   . Mild sleep apnea    no CPAP (09/21/2018)  . Myocardial infarction [facility])    "was told I have had one; never knew about it" (09/21/2018)  . Nonischemic cardiomyopathy ([facility])    ECHO EF 40-45%, Septal Thickness,and mild global left ventricular dysfunction; stress test 03/2004 +VE, refersible defect; ECHO 03/2005 @[facility]  EF 55%   Assessment: 62 year old female seen in office 12/3 with unstable angina and abnormal stress test. Now s/p cath this morning. Report pending, received orders to start heparin this afternoon.   12/6 AM update: heparin level just below goal, no issues per RN, CABG later this AM  Goal of Therapy:  Heparin level 0.3-0.7 units/ml Monitor platelets by anticoagulation protocol: Yes   Plan:  Inc heparin to 1050 units/hr Heparin off on call for CABG in a few hours  Narda Bonds, PharmD, [place]  Pharmacist Phone: [phone]

## 2018-09-22 NOTE — Transfer of Care (Signed)
Immediate Anesthesia Transfer of Care Note  Patient: Jennifer [name]  Procedure(s) Performed: CORONARY ARTERY BYPASS GRAFTING (CABG), ON PUMP, TIMES TWO, USING LEFT INTERNAL MAMMARY ARTERY AND ENDOSCOPICALLY HARVESTED RIGHT GREATER SAPHENOUS VEIN (N/A Chest) TRANSESOPHAGEAL ECHOCARDIOGRAM (TEE) (N/A )  Patient Location: ICU  Anesthesia Type:General  Level of Consciousness: Patient remains intubated per anesthesia plan  Airway & Oxygen Therapy: Patient remains intubated per anesthesia plan and Patient placed on Ventilator (see vital sign flow sheet for setting)  Post-op Assessment: Report given to RN and Post -op Vital signs reviewed and stable  Post vital signs: Reviewed and stable  Last Vitals:  Vitals Value Taken Time  BP 92/57 09/22/2018 12:15 PM  Temp    Pulse 89 09/22/2018 12:17 PM  Resp 15 09/22/2018 12:17 PM  SpO2 98 % 09/22/2018 12:17 PM  Vitals shown include unvalidated device data.  Last Pain:  Vitals:   09/22/18 0400  TempSrc:   PainSc: 0-No pain      Patients Stated Pain Goal: 0 (16/10/96 0454)  Complications: No apparent anesthesia complications

## 2018-09-22 NOTE — Anesthesia Procedure Notes (Signed)
Central Venous Catheter Insertion Performed by: Effie Berkshire, MD, anesthesiologist Start/End12/03/2018 6:20 AM, 09/22/2018 6:30 AM Patient location: Pre-op. Preanesthetic checklist: patient identified, IV checked, site marked, risks and benefits discussed, surgical consent, monitors and equipment checked, pre-op evaluation, timeout performed and anesthesia consent Lidocaine 1% used for infiltration and patient sedated Hand hygiene performed  and maximum sterile barriers used  Catheter size: 9 Fr MAC introducer Procedure performed using ultrasound guided technique. Ultrasound Notes:anatomy identified, needle tip was noted to be adjacent to the nerve/plexus identified, no ultrasound evidence of intravascular and/or intraneural injection and image(s) printed for medical record Attempts: 1 Following insertion, line sutured and dressing applied. Post procedure assessment: blood return through all ports, free fluid flow and no air  Patient tolerated the procedure well with no immediate complications.

## 2018-09-22 NOTE — Progress Notes (Signed)
       [address]       [place] [address]             [phone]      S/p CABG x 2  BP 95/75   Pulse 97   Temp 100 F (37.8 C)   Resp (!) 24   Ht 5\' 2"  (1.575 m)   Wt 95 kg   SpO2 93%   BMI 38.31 kg/m  PA 36/22, CI= 2.3 Extubated Neuro intact  Intake/Output Summary (Last 24 hours) at 09/22/2018 1735 Last data filed at 09/22/2018 1700 Gross per 24 hour  Intake 3444.62 ml  Output 3270 ml  Net 174.62 ml   CBG well controlled  Doing well early postop  Remo Lipps C. Roxan Hockey, MD [facility] [phone]

## 2018-09-22 NOTE — Anesthesia Procedure Notes (Signed)
Procedure Name: Intubation Date/Time: 09/22/2018 7:53 AM Performed by: Bryson Corona, CRNA Pre-anesthesia Checklist: Patient identified, Emergency Drugs available, Suction available and Patient being monitored Patient Re-evaluated:Patient Re-evaluated prior to induction Oxygen Delivery Method: Circle System Utilized Preoxygenation: Pre-oxygenation with 100% oxygen Induction Type: IV induction Ventilation: Mask ventilation without difficulty Laryngoscope Size: Mac and 3 Grade View: Grade II Tube type: Oral Tube size: 7.0 mm Number of attempts: 1 Airway Equipment and Method: Stylet and Oral airway Placement Confirmation: ETT inserted through vocal cords under direct vision,  positive ETCO2 and breath sounds checked- equal and bilateral Secured at: 22 cm Tube secured with: Tape Dental Injury: Teeth and Oropharynx as per pre-operative assessment

## 2018-09-22 NOTE — Anesthesia Procedure Notes (Signed)
Central Venous Catheter Insertion Performed by: Effie Berkshire, MD, anesthesiologist Start/End12/03/2018 6:30 AM, 09/22/2018 6:35 AM Patient location: Pre-op. Preanesthetic checklist: patient identified, IV checked, site marked, risks and benefits discussed, surgical consent, monitors and equipment checked, pre-op evaluation, timeout performed and anesthesia consent Hand hygiene performed  and maximum sterile barriers used  PA cath was placed.Swan type:thermodilution Procedure performed without using ultrasound guided technique. Attempts: 1 Patient tolerated the procedure well with no immediate complications.

## 2018-09-22 NOTE — Progress Notes (Signed)
Patient, accompanied by RN, transported to OR holding area. Vital signs stable. Report given to pre-op RN.

## 2018-09-22 NOTE — OR Nursing (Signed)
Twenty minute call to 2 Heart Charge nurse at 1134. Spoke to [place]. Cath Lab also notified of timing.

## 2018-09-22 NOTE — Interval H&P Note (Signed)
History and Physical Interval Note:  09/22/2018 7:26 AM  Jennifer [name]  has presented today for surgery, with the diagnosis of SEVERE CAD  The various methods of treatment have been discussed with the patient and family. After consideration of risks, benefits and other options for treatment, the patient has consented to  Procedure(s): CORONARY ARTERY BYPASS GRAFTING (CABG) (N/A) TRANSESOPHAGEAL ECHOCARDIOGRAM (TEE) (N/A) as a surgical intervention .  The patient's history has been reviewed, patient examined, no change in status, stable for surgery.  I have reviewed the patient's chart and labs.  Questions were answered to the patient's satisfaction.     Melrose Nakayama

## 2018-09-23 ENCOUNTER — Inpatient Hospital Stay (HOSPITAL_COMMUNITY): Payer: Medicare PPO

## 2018-09-23 DIAGNOSIS — Z951 Presence of aortocoronary bypass graft: Secondary | ICD-10-CM

## 2018-09-23 LAB — CBC
HCT: 30.9 % — ABNORMAL LOW (ref 36.0–46.0)
HCT: 27.9 % — ABNORMAL LOW (ref 36.0–46.0)
HEMOGLOBIN: 9 g/dL — AB (ref 12.0–15.0)
Hemoglobin: 9.8 g/dL — ABNORMAL LOW (ref 12.0–15.0)
MCH: 31.7 pg (ref 26.0–34.0)
MCH: 32.5 pg (ref 26.0–34.0)
MCHC: 31.7 g/dL (ref 30.0–36.0)
MCHC: 32.3 g/dL (ref 30.0–36.0)
MCV: 100 fL (ref 80.0–100.0)
MCV: 100.7 fL — ABNORMAL HIGH (ref 80.0–100.0)
Platelets: 180 10*3/uL (ref 150–400)
Platelets: 127 10*3/uL — ABNORMAL LOW (ref 150–400)
RBC: 3.09 MIL/uL — ABNORMAL LOW (ref 3.87–5.11)
RBC: 2.77 MIL/uL — ABNORMAL LOW (ref 3.87–5.11)
RDW: 11.5 % (ref 11.5–15.5)
RDW: 11.4 % — ABNORMAL LOW (ref 11.5–15.5)
WBC: 15 10*3/uL — ABNORMAL HIGH (ref 4.0–10.5)
WBC: 12.6 10*3/uL — ABNORMAL HIGH (ref 4.0–10.5)
nRBC: 0 % (ref 0.0–0.2)
nRBC: 0 % (ref 0.0–0.2)

## 2018-09-23 LAB — POCT I-STAT, CHEM 8
BUN: 11 mg/dL (ref 8–23)
Calcium, Ion: 1.2 mmol/L (ref 1.15–1.40)
Chloride: 104 mmol/L (ref 98–111)
Creatinine, Ser: 0.5 mg/dL (ref 0.44–1.00)
Glucose, Bld: 114 mg/dL — ABNORMAL HIGH (ref 70–99)
HEMATOCRIT: 24 % — AB (ref 36.0–46.0)
HEMOGLOBIN: 8.2 g/dL — AB (ref 12.0–15.0)
POTASSIUM: 3.9 mmol/L (ref 3.5–5.1)
Sodium: 134 mmol/L — ABNORMAL LOW (ref 135–145)
TCO2: 25 mmol/L (ref 22–32)

## 2018-09-23 LAB — CREATININE, SERUM
Creatinine, Ser: 0.72 mg/dL (ref 0.44–1.00)
GFR calc Af Amer: >60 mL/min (ref >60)
GFR calc non Af Amer: >60 mL/min (ref >60)

## 2018-09-23 LAB — GLUCOSE, CAPILLARY
GLUCOSE-CAPILLARY: 117 mg/dL — AB (ref 70–99)
GLUCOSE-CAPILLARY: 95 mg/dL (ref 70–99)
Glucose-Capillary: 103 mg/dL — ABNORMAL HIGH (ref 70–99)
Glucose-Capillary: 106 mg/dL — ABNORMAL HIGH (ref 70–99)
Glucose-Capillary: 108 mg/dL — ABNORMAL HIGH (ref 70–99)
Glucose-Capillary: 108 mg/dL — ABNORMAL HIGH (ref 70–99)
Glucose-Capillary: 112 mg/dL — ABNORMAL HIGH (ref 70–99)
Glucose-Capillary: 125 mg/dL — ABNORMAL HIGH (ref 70–99)
Glucose-Capillary: 125 mg/dL — ABNORMAL HIGH (ref 70–99)
Glucose-Capillary: 129 mg/dL — ABNORMAL HIGH (ref 70–99)
Glucose-Capillary: 130 mg/dL — ABNORMAL HIGH (ref 70–99)
Glucose-Capillary: 131 mg/dL — ABNORMAL HIGH (ref 70–99)
Glucose-Capillary: 133 mg/dL — ABNORMAL HIGH (ref 70–99)
Glucose-Capillary: 135 mg/dL — ABNORMAL HIGH (ref 70–99)
Glucose-Capillary: 147 mg/dL — ABNORMAL HIGH (ref 70–99)
Glucose-Capillary: 96 mg/dL (ref 70–99)

## 2018-09-23 LAB — BASIC METABOLIC PANEL
Anion gap: 7 (ref 5–15)
BUN: 13 mg/dL (ref 8–23)
CO2: 21 mmol/L — ABNORMAL LOW (ref 22–32)
CREATININE: 0.75 mg/dL (ref 0.44–1.00)
Calcium: 7.7 mg/dL — ABNORMAL LOW (ref 8.9–10.3)
Chloride: 106 mmol/L (ref 98–111)
GFR calc Af Amer: >60 mL/min (ref >60)
GFR calc non Af Amer: >60 mL/min (ref >60)
Glucose, Bld: 131 mg/dL — ABNORMAL HIGH (ref 70–99)
Potassium: 3.9 mmol/L (ref 3.5–5.1)
Sodium: 134 mmol/L — ABNORMAL LOW (ref 135–145)

## 2018-09-23 LAB — MAGNESIUM
Magnesium: 2.4 mg/dL (ref 1.7–2.4)
Magnesium: 2 mg/dL (ref 1.7–2.4)

## 2018-09-23 MED ORDER — ENOXAPARIN SODIUM 40 MG/0.4ML ~~LOC~~ SOLN
40.0000 mg | Freq: Every day | SUBCUTANEOUS | Status: DC
  Administered 2018-09-23 – 2018-09-27 (×5): 40 mg via SUBCUTANEOUS
  Filled 2018-09-23 (×5): qty 0.4

## 2018-09-23 MED ORDER — INSULIN DETEMIR 100 UNIT/ML ~~LOC~~ SOLN
20.0000 [IU] | Freq: Two times a day (BID) | SUBCUTANEOUS | Status: DC
  Administered 2018-09-23 (×2): 20 [IU] via SUBCUTANEOUS
  Filled 2018-09-23 (×3): qty 0.2

## 2018-09-23 MED ORDER — INSULIN ASPART 100 UNIT/ML ~~LOC~~ SOLN: 3.0000 [IU] | Freq: Three times a day (TID) | SUBCUTANEOUS | Status: DC

## 2018-09-23 MED ORDER — CARVEDILOL 3.125 MG PO TABS
3.1250 mg | ORAL_TABLET | Freq: Two times a day (BID) | ORAL | Status: DC
  Administered 2018-09-23 – 2018-09-25 (×5): 3.125 mg via ORAL
  Filled 2018-09-23 (×5): qty 1

## 2018-09-23 MED ORDER — INSULIN ASPART 100 UNIT/ML ~~LOC~~ SOLN: 0.0000 [IU] | SUBCUTANEOUS | Status: DC

## 2018-09-23 NOTE — Progress Notes (Signed)
      [address]       [place] [address]             [phone]      Resting  BP (!) 111/56   Pulse 98   Temp 99.2 F (37.3 C) (Oral)   Resp (!) 28   Ht 5\' 2"  (1.575 m)   Wt 86.6 kg   SpO2 99%   BMI 34.92 kg/m   Intake/Output Summary (Last 24 hours) at 09/23/2018 1719 Last data filed at 09/23/2018 1700 Gross per 24 hour  Intake 2789.3 ml  Output 1465 ml  Net 1324.3 ml   Hct= 24 Creatinine=0.5, K= 3.9  Off levophed, continue milrinone  Remo Lipps C. Roxan Hockey, MD [facility] [phone]

## 2018-09-23 NOTE — Progress Notes (Signed)
1 Day Post-Op Procedure(s) (LRB): CORONARY ARTERY BYPASS GRAFTING (CABG), ON PUMP, TIMES TWO, USING LEFT INTERNAL MAMMARY ARTERY AND ENDOSCOPICALLY HARVESTED RIGHT GREATER SAPHENOUS VEIN (N/A) TRANSESOPHAGEAL ECHOCARDIOGRAM (TEE) (N/A) Subjective: Complains of incisional pain  Objective: Vital signs in last 24 hours: Temp:  [97.2 F (36.2 C)-100.8 F (38.2 C)] 99.5 F (37.5 C) (12/07 0700) Pulse Rate:  [30-107] 98 (12/07 0700) Cardiac Rhythm: Sinus tachycardia;Atrial paced (12/07 0000) Resp:  [0-49] 49 (12/07 0700) BP: (59-122)/(41-87) 102/60 (12/07 0700) SpO2:  [87 %-100 %] 99 % (12/07 0700) FiO2 (%):  [40 %-50 %] 40 % (12/06 1602) Weight:  [86.6 kg] 86.6 kg (12/07 0500)  Hemodynamic parameters for last 24 hours: PAP: (28-47)/(9-27) 31/12 CO:  [2.7 L/min-5.2 L/min] 5.2 L/min CI:  [1.4 L/min/m2-2.6 L/min/m2] 2.6 L/min/m2  Intake/Output from previous day: 12/06 0701 - 12/07 0700 In: 5036.2 [I.V.:3433; Blood:315; NG/GT:30; IV Piggyback:1258.2] Out: 4287 [Urine:2485; Blood:475; Chest Tube:650] Intake/Output this shift: No intake/output data recorded.  General appearance: alert, cooperative and no distress Neurologic: intact Heart: regular rate and rhythm Lungs: diminished breath sounds bibasilar Abdomen: normal findings: soft, non-tender  Lab Results: Recent Labs    09/22/18 1744 09/22/18 1750 09/23/18 0434  WBC 14.5*  --  15.0*  HGB 10.4* 9.9* 9.8*  HCT 32.4* 29.0* 30.9*  PLT 197  --  180   BMET:  Recent Labs    09/22/18 0020  09/22/18 1750 09/23/18 0434  NA 140   < > 140 134*  K 3.9   < > 3.6 3.9  CL 108   < > 110 106  CO2 22  --   --  21*  GLUCOSE 101*   < > 131* 131*  BUN 21   < > 13 13  CREATININE 0.76   < > 0.60 0.75  CALCIUM 8.7*  --   --  7.7*   < > = values in this interval not displayed.    PT/INR:  Recent Labs    09/22/18 1222  LABPROT 15.3*  INR 1.22   ABG    Component Value Date/Time   PHART 7.323 (L) 09/22/2018 1746   HCO3 21.7  09/22/2018 1746   TCO2 24 09/22/2018 1750   ACIDBASEDEF 4.0 (H) 09/22/2018 1746   O2SAT 91.0 09/22/2018 1746   CBG (last 3)  Recent Labs    09/23/18 0515 09/23/18 0655 09/23/18 0804  GLUCAP 129* 125* 125*    Assessment/Plan: S/P Procedure(s) (LRB): CORONARY ARTERY BYPASS GRAFTING (CABG), ON PUMP, TIMES TWO, USING LEFT INTERNAL MAMMARY ARTERY AND ENDOSCOPICALLY HARVESTED RIGHT GREATER SAPHENOUS VEIN (N/A) TRANSESOPHAGEAL ECHOCARDIOGRAM (TEE) (N/A) POD # 1  CV- in SR, good index on milrinone + levophed  Decrease milrinone to 0.125, wean levophed as BP allows  Coreg for beta blocker RESP- IS for atelectasis RENAL- creatinine and lytes OK ENDO- CBG controlled with insulin drip- transition to Levemir + SSI Anemia secondary to ABL- mild, follow Dc chest tubes mobilize   LOS: 2 days    Jennifer [name] 09/23/2018

## 2018-09-23 NOTE — Plan of Care (Signed)

## 2018-09-24 ENCOUNTER — Inpatient Hospital Stay (HOSPITAL_COMMUNITY): Payer: Medicare PPO

## 2018-09-24 LAB — BASIC METABOLIC PANEL
Anion gap: 6 (ref 5–15)
BUN: 11 mg/dL (ref 8–23)
CO2: 24 mmol/L (ref 22–32)
Calcium: 8.1 mg/dL — ABNORMAL LOW (ref 8.9–10.3)
Chloride: 104 mmol/L (ref 98–111)
Creatinine, Ser: 0.61 mg/dL (ref 0.44–1.00)
GFR calc Af Amer: >60 mL/min (ref >60)
GFR calc non Af Amer: >60 mL/min (ref >60)
Glucose, Bld: 136 mg/dL — ABNORMAL HIGH (ref 70–99)
Potassium: 3.9 mmol/L (ref 3.5–5.1)
Sodium: 134 mmol/L — ABNORMAL LOW (ref 135–145)

## 2018-09-24 LAB — COOXEMETRY PANEL
Carboxyhemoglobin: 1.4 % (ref 0.5–1.5)
Methemoglobin: 1.8 % — ABNORMAL HIGH (ref 0.0–1.5)
O2 Saturation: 76.6 %
Total hemoglobin: 8.3 g/dL — ABNORMAL LOW (ref 12.0–16.0)

## 2018-09-24 LAB — CBC
HCT: 26.1 % — ABNORMAL LOW (ref 36.0–46.0)
HEMOGLOBIN: 8.4 g/dL — AB (ref 12.0–15.0)
MCH: 32.7 pg (ref 26.0–34.0)
MCHC: 32.2 g/dL (ref 30.0–36.0)
MCV: 101.6 fL — ABNORMAL HIGH (ref 80.0–100.0)
Platelets: 125 10*3/uL — ABNORMAL LOW (ref 150–400)
RBC: 2.57 MIL/uL — ABNORMAL LOW (ref 3.87–5.11)
RDW: 11.6 % (ref 11.5–15.5)
WBC: 11.3 10*3/uL — ABNORMAL HIGH (ref 4.0–10.5)
nRBC: 0 % (ref 0.0–0.2)

## 2018-09-24 LAB — GLUCOSE, CAPILLARY
Glucose-Capillary: 89 mg/dL (ref 70–99)
Glucose-Capillary: 111 mg/dL — ABNORMAL HIGH (ref 70–99)
Glucose-Capillary: 91 mg/dL (ref 70–99)
Glucose-Capillary: 91 mg/dL (ref 70–99)
Glucose-Capillary: 90 mg/dL (ref 70–99)

## 2018-09-24 MED ORDER — INSULIN ASPART 100 UNIT/ML ~~LOC~~ SOLN: 0.0000 [IU] | Freq: Three times a day (TID) | SUBCUTANEOUS | Status: DC

## 2018-09-24 MED ORDER — POTASSIUM CHLORIDE CRYS ER 20 MEQ PO TBCR
20.0000 meq | EXTENDED_RELEASE_TABLET | Freq: Two times a day (BID) | ORAL | Status: DC
  Administered 2018-09-24 – 2018-09-28 (×9): 20 meq via ORAL
  Filled 2018-09-24 (×9): qty 1

## 2018-09-24 NOTE — Progress Notes (Signed)
Pt sustained a 19 beat run of VTach @0025 . Pt immediately assessed- A&Ox4 & asymptomatic. Strip printed & placed in chart, will continue to monitor closely.  Current VS: HR 98 SpO2 98 RR 18 BP 95/61 (72)

## 2018-09-24 NOTE — Progress Notes (Signed)
2 Days Post-Op Procedure(s) (LRB): CORONARY ARTERY BYPASS GRAFTING (CABG), ON PUMP, TIMES TWO, USING LEFT INTERNAL MAMMARY ARTERY AND ENDOSCOPICALLY HARVESTED RIGHT GREATER SAPHENOUS VEIN (N/A) TRANSESOPHAGEAL ECHOCARDIOGRAM (TEE) (N/A) Subjective: Some incisional pain, short of breath with activity  Objective: Vital signs in last 24 hours: Temp:  [97.8 F (36.6 C)-99.2 F (37.3 C)] 98.3 F (36.8 C) (12/08 0825) Pulse Rate:  [93-109] 108 (12/08 0800) Cardiac Rhythm: Normal sinus rhythm;Sinus tachycardia (12/08 0800) Resp:  [17-37] 21 (12/08 0800) BP: (85-120)/(53-77) 99/65 (12/08 0800) SpO2:  [97 %-100 %] 99 % (12/08 0800) Weight:  [88 kg] 88 kg (12/08 0500)  Hemodynamic parameters for last 24 hours:    Intake/Output from previous day: 12/07 0701 - 12/08 0700 In: 824.4 [P.O.:240; I.V.:384.3; IV Piggyback:200.1] Out: 1060 [Urine:990; Chest Tube:70] Intake/Output this shift: Total I/O In: 3.6 [I.V.:3.6] Out: -   General appearance: alert, cooperative and no distress Neurologic: intact Heart: tachy, regular Lungs: diminished breath sounds bibasilar Abdomen: normal findings: soft, non-tender  Lab Results: Recent Labs    09/23/18 1836 09/24/18 0310  WBC 12.6* 11.3*  HGB 9.0* 8.4*  HCT 27.9* 26.1*  PLT 127* 125*   BMET:  Recent Labs    09/23/18 0434  09/23/18 1634 09/24/18 0310  NA 134*  --  134* 134*  K 3.9  --  3.9 3.9  CL 106  --  104 104  CO2 21*  --   --  24  GLUCOSE 131*  --  114* 136*  BUN 13  --  11 11  CREATININE 0.75   < > 0.50 0.61  CALCIUM 7.7*  --   --  8.1*   < > = values in this interval not displayed.    PT/INR:  Recent Labs    09/22/18 1222  LABPROT 15.3*  INR 1.22   ABG    Component Value Date/Time   PHART 7.323 (L) 09/22/2018 1746   HCO3 21.7 09/22/2018 1746   TCO2 25 09/23/2018 1634   ACIDBASEDEF 4.0 (H) 09/22/2018 1746   O2SAT 76.6 09/24/2018 0320   CBG (last 3)  Recent Labs    09/23/18 2339 09/24/18 0339 09/24/18 0810   GLUCAP 96 89 111*    Assessment/Plan: S/P Procedure(s) (LRB): CORONARY ARTERY BYPASS GRAFTING (CABG), ON PUMP, TIMES TWO, USING LEFT INTERNAL MAMMARY ARTERY AND ENDOSCOPICALLY HARVESTED RIGHT GREATER SAPHENOUS VEIN (N/A) TRANSESOPHAGEAL ECHOCARDIOGRAM (TEE) (N/A) -CV- in sinus tachy  Had a brief run of NSVT last night, asymptomatic  Co-ox 76- dc milrinone  Continue coreg, ASA, statin RESP- continue IS RENAL- creatinine and lytes OK- diurese ENDO- CBG well controlled- dc levemir, continue SSI Anemia secondary to ABL- stable, follow Thrombocytopenia- stable, follow Continue cardiac rehab SCD + enoxaparin   LOS: 3 days    Jennifer [name] 09/24/2018

## 2018-09-24 NOTE — Progress Notes (Signed)
      [address]       [place] [address]             [phone]      resting comfortably  BP 117/68   Pulse 84   Temp 98.1 F (36.7 C) (Oral)   Resp (!) 24   Ht 5\' 2"  (1.575 m)   Wt 88 kg   SpO2 99%   BMI 35.46 kg/m   Intake/Output Summary (Last 24 hours) at 09/24/2018 1824 Last data filed at 09/24/2018 1600 Gross per 24 hour  Intake 636.82 ml  Output 1020 ml  Net -383.18 ml   Check co-ox in AM off milrinone  Doing well POD # 2  Remo Lipps C. Roxan Hockey, MD [facility] [phone]

## 2018-09-25 ENCOUNTER — Encounter (HOSPITAL_COMMUNITY): Payer: Self-pay | Admitting: Thoracic Surgery (Cardiothoracic Vascular Surgery)

## 2018-09-25 LAB — CBC
HCT: 26.3 % — ABNORMAL LOW (ref 36.0–46.0)
Hemoglobin: 8 g/dL — ABNORMAL LOW (ref 12.0–15.0)
MCH: 31.4 pg (ref 26.0–34.0)
MCHC: 30.4 g/dL (ref 30.0–36.0)
MCV: 103.1 fL — ABNORMAL HIGH (ref 80.0–100.0)
Platelets: 146 10*3/uL — ABNORMAL LOW (ref 150–400)
RBC: 2.55 MIL/uL — ABNORMAL LOW (ref 3.87–5.11)
RDW: 11.7 % (ref 11.5–15.5)
WBC: 10 10*3/uL (ref 4.0–10.5)
nRBC: 0 % (ref 0.0–0.2)

## 2018-09-25 LAB — BASIC METABOLIC PANEL
Anion gap: 7 (ref 5–15)
BUN: 13 mg/dL (ref 8–23)
CO2: 24 mmol/L (ref 22–32)
Calcium: 8.3 mg/dL — ABNORMAL LOW (ref 8.9–10.3)
Chloride: 104 mmol/L (ref 98–111)
Creatinine, Ser: 0.68 mg/dL (ref 0.44–1.00)
GFR calc Af Amer: >60 mL/min (ref >60)
GFR calc non Af Amer: >60 mL/min (ref >60)
Glucose, Bld: 86 mg/dL (ref 70–99)
Potassium: 4.6 mmol/L (ref 3.5–5.1)
Sodium: 135 mmol/L (ref 135–145)

## 2018-09-25 LAB — GLUCOSE, CAPILLARY
Glucose-Capillary: 79 mg/dL (ref 70–99)
Glucose-Capillary: 76 mg/dL (ref 70–99)

## 2018-09-25 MED ORDER — ZOLPIDEM TARTRATE 5 MG PO TABS: 5.0000 mg | ORAL_TABLET | Freq: Every evening | ORAL | Status: DC | PRN

## 2018-09-25 MED ORDER — MAGNESIUM HYDROXIDE 400 MG/5ML PO SUSP: 30.0000 mL | Freq: Every day | ORAL | Status: DC | PRN

## 2018-09-25 MED ORDER — SODIUM CHLORIDE 0.9% FLUSH
3.0000 mL | Freq: Two times a day (BID) | INTRAVENOUS | Status: DC
  Administered 2018-09-25 – 2018-09-27 (×5): 3 mL via INTRAVENOUS

## 2018-09-25 MED ORDER — ALUM & MAG HYDROXIDE-SIMETH 200-200-20 MG/5ML PO SUSP: 15.0000 mL | Freq: Four times a day (QID) | ORAL | Status: DC | PRN

## 2018-09-25 MED ORDER — MOVING RIGHT ALONG BOOK
Freq: Once | Status: DC
  Filled 2018-09-25: qty 1

## 2018-09-25 MED ORDER — SODIUM CHLORIDE 0.9% FLUSH: 3.0000 mL | INTRAVENOUS | Status: DC | PRN

## 2018-09-25 MED ORDER — SODIUM CHLORIDE 0.9 % IV SOLN: 250.0000 mL | INTRAVENOUS | Status: DC | PRN

## 2018-09-25 MED FILL — Potassium Chloride Inj 2 mEq/ML: INTRAVENOUS | Qty: 40 | Status: AC

## 2018-09-25 MED FILL — Lidocaine HCl(Cardiac) IV PF Soln Pref Syr 100 MG/5ML (2%): INTRAVENOUS | Qty: 10 | Status: AC

## 2018-09-25 MED FILL — Electrolyte-R (PH 7.4) Solution: INTRAVENOUS | Qty: 4000 | Status: AC

## 2018-09-25 MED FILL — Magnesium Sulfate Inj 50%: INTRAMUSCULAR | Qty: 10 | Status: AC

## 2018-09-25 MED FILL — Mannitol IV Soln 20%: INTRAVENOUS | Qty: 500 | Status: AC

## 2018-09-25 MED FILL — Sodium Bicarbonate IV Soln 8.4%: INTRAVENOUS | Qty: 50 | Status: AC

## 2018-09-25 MED FILL — Sodium Chloride IV Soln 0.9%: INTRAVENOUS | Qty: 2000 | Status: AC

## 2018-09-25 MED FILL — Calcium Chloride Inj 10%: INTRAVENOUS | Qty: 10 | Status: AC

## 2018-09-25 MED FILL — Heparin Sodium (Porcine) Inj 1000 Unit/ML: INTRAMUSCULAR | Qty: 10 | Status: AC

## 2018-09-25 MED FILL — Heparin Sodium (Porcine) Inj 1000 Unit/ML: INTRAMUSCULAR | Qty: 30 | Status: AC

## 2018-09-25 NOTE — Care Management Note (Signed)
Case Management Note  Patient Details  Name: BEVERLYN MCGINNESS MRN: [id number] Date of Birth: [birth year]  Subjective/Objective: 63 yo female presented for elective outpatient cath with MVD; s/p CABG                  Action/Plan: CM consult for request for hospital bed by daughter acknowledged. CM attempted to discuss POC with patient/daughter, with patient sleeping and daughter requesting CM f/u at a later time. Patient has [facility] and must met qualifications for a hospital bed per Medicare with a documented medical condition and be medically necessary. CM team will continue to follow.   Expected Discharge Date:                  Expected Discharge Plan:  Home/Self Care  In-House Referral:  NA  Discharge planning Services  CM Consult  Post Acute Care Choice:  NA Choice offered to:  NA  DME Arranged:  N/A DME Agency:  NA  HH Arranged:  NA HH Agency:  NA  Status of Service:  In process, will continue to follow  If discussed at Long Length of Stay Meetings, dates discussed:    Additional Comments:  Midge Minium RN, BSN, NCM-BC, ACM-RN [phone] 09/25/2018, 2:33 PM

## 2018-09-25 NOTE — Progress Notes (Signed)
3 Days Post-Op Procedure(s) (LRB): CORONARY ARTERY BYPASS GRAFTING (CABG), ON PUMP, TIMES TWO, USING LEFT INTERNAL MAMMARY ARTERY AND ENDOSCOPICALLY HARVESTED RIGHT GREATER SAPHENOUS VEIN (N/A) TRANSESOPHAGEAL ECHOCARDIOGRAM (TEE) (N/A) Subjective: Some incisional discomfort  Objective: Vital signs in last 24 hours: Temp:  [98 F (36.7 C)-99 F (37.2 C)] 98.7 F (37.1 C) (12/09 0700) Pulse Rate:  [43-115] 87 (12/09 0700) Cardiac Rhythm: Other (Comment) (12/09 0400) Resp:  [16-38] 18 (12/09 0700) BP: (86-117)/(49-81) 105/49 (12/09 0600) SpO2:  [89 %-100 %] 100 % (12/09 0700) Weight:  [87.6 kg] 87.6 kg (12/09 0500)  Hemodynamic parameters for last 24 hours:    Intake/Output from previous day: 12/08 0701 - 12/09 0700 In: 607.7 [P.O.:600; I.V.:7.7] Out: 1165 [Urine:1165] Intake/Output this shift: No intake/output data recorded.  General appearance: alert, cooperative and no distress Neurologic: intact Heart: regular rate and rhythm Lungs: diminished breath sounds bibasilar Abdomen: normal findings: soft, non-tender  Lab Results: Recent Labs    09/24/18 0310 09/25/18 0316  WBC 11.3* 10.0  HGB 8.4* 8.0*  HCT 26.1* 26.3*  PLT 125* 146*   BMET:  Recent Labs    09/24/18 0310 09/25/18 0316  NA 134* 135  K 3.9 4.6  CL 104 104  CO2 24 24  GLUCOSE 136* 86  BUN 11 13  CREATININE 0.61 0.68  CALCIUM 8.1* 8.3*    PT/INR:  Recent Labs    09/22/18 1222  LABPROT 15.3*  INR 1.22   ABG    Component Value Date/Time   PHART 7.323 (L) 09/22/2018 1746   HCO3 21.7 09/22/2018 1746   TCO2 25 09/23/2018 1634   ACIDBASEDEF 4.0 (H) 09/22/2018 1746   O2SAT 76.6 09/24/2018 0320   CBG (last 3)  Recent Labs    09/24/18 1512 09/24/18 2059 09/25/18 0647  GLUCAP 91 90 79    Assessment/Plan: S/P Procedure(s) (LRB): CORONARY ARTERY BYPASS GRAFTING (CABG), ON PUMP, TIMES TWO, USING LEFT INTERNAL MAMMARY ARTERY AND ENDOSCOPICALLY HARVESTED RIGHT GREATER SAPHENOUS VEIN  (N/A) TRANSESOPHAGEAL ECHOCARDIOGRAM (TEE) (N/A) -Cv- in SR  BP relatively low but tolerating well  RESP - continue IS for atelectasis  RENAL- creatinine and lytes OK, DC Foley  ENDO- CBG normal- dc SSI  Continue cardiac rehab  Transfer to tele   LOS: 4 days    Melrose Nakayama 09/25/2018

## 2018-09-25 NOTE — Progress Notes (Signed)
Report called to Nigel Mormon on 4E.

## 2018-09-26 ENCOUNTER — Inpatient Hospital Stay (HOSPITAL_COMMUNITY): Payer: Medicare PPO

## 2018-09-26 LAB — BASIC METABOLIC PANEL
Anion gap: 8 (ref 5–15)
BUN: 12 mg/dL (ref 8–23)
CHLORIDE: 105 mmol/L (ref 98–111)
CO2: 25 mmol/L (ref 22–32)
Calcium: 8.4 mg/dL — ABNORMAL LOW (ref 8.9–10.3)
Creatinine, Ser: 0.72 mg/dL (ref 0.44–1.00)
GFR calc Af Amer: >60 mL/min (ref >60)
GFR calc non Af Amer: >60 mL/min (ref >60)
Glucose, Bld: 101 mg/dL — ABNORMAL HIGH (ref 70–99)
POTASSIUM: 4 mmol/L (ref 3.5–5.1)
Sodium: 138 mmol/L (ref 135–145)

## 2018-09-26 LAB — CBC
HCT: 25.3 % — ABNORMAL LOW (ref 36.0–46.0)
Hemoglobin: 8.1 g/dL — ABNORMAL LOW (ref 12.0–15.0)
MCH: 32 pg (ref 26.0–34.0)
MCHC: 32 g/dL (ref 30.0–36.0)
MCV: 100 fL (ref 80.0–100.0)
Platelets: 191 10*3/uL (ref 150–400)
RBC: 2.53 MIL/uL — ABNORMAL LOW (ref 3.87–5.11)
RDW: 11.6 % (ref 11.5–15.5)
WBC: 7.2 10*3/uL (ref 4.0–10.5)
nRBC: 0 % (ref 0.0–0.2)

## 2018-09-26 LAB — MAGNESIUM: Magnesium: 2 mg/dL (ref 1.7–2.4)

## 2018-09-26 MED ORDER — FUROSEMIDE 40 MG PO TABS
40.0000 mg | ORAL_TABLET | Freq: Every day | ORAL | Status: DC
  Administered 2018-09-26 – 2018-09-28 (×3): 40 mg via ORAL
  Filled 2018-09-26 (×3): qty 1

## 2018-09-26 MED ORDER — LISINOPRIL 5 MG PO TABS
5.0000 mg | ORAL_TABLET | Freq: Every day | ORAL | Status: DC
  Administered 2018-09-27: 5 mg via ORAL
  Filled 2018-09-26: qty 1

## 2018-09-26 MED ORDER — LISINOPRIL 5 MG PO TABS: 5.0000 mg | ORAL_TABLET | Freq: Every day | ORAL | Status: DC

## 2018-09-26 MED ORDER — CARVEDILOL 6.25 MG PO TABS
6.2500 mg | ORAL_TABLET | Freq: Two times a day (BID) | ORAL | Status: DC
  Administered 2018-09-26 – 2018-09-28 (×4): 6.25 mg via ORAL
  Filled 2018-09-26 (×4): qty 1

## 2018-09-26 NOTE — Progress Notes (Signed)
CARDIAC REHAB PHASE I   PRE:  Rate/Rhythm: 105 ST with PVC/PACs    BP: sitting 115/81    SaO2: 94 RA  MODE:  Ambulation: 390 ft   POST:  Rate/Rhythm: 122 ST    BP: sitting 129/65     SaO2: 95 RA  Pt feeling exhausted today. Has been going back and forth to bathroom and struggling with incontinence. HR elevated with many PVCs (bigeminy at times). Steady walking with RW, just fatigued. Rest x2. HR up to 122 ST, less PVCs with walking. To recliner after walk. VSS. Put BSC near her to help with incontinence. Encouraged x2 more walks in hall, which pt is reluctant to do as she feels so tired. Practiced IS, 700 mL. [phone]   [place], ACSM 09/26/2018 10:02 AM

## 2018-09-26 NOTE — Care Management Note (Signed)
Case Management Note Initial CM note completed by Midge Minium RN, BSN, NCM-BC, ACM-RN [phone] 09/25/2018, 2:33 PM   Patient Details  Name: Jennifer [name] MRN: [id number] Date of Birth: 27-May-1956  Subjective/Objective: 62 yo female presented for elective outpatient cath with MVD; s/p CABG                  Action/Plan: CM consult for request for hospital bed by daughter acknowledged. CM attempted to discuss POC with patient/daughter, with patient sleeping and daughter requesting CM f/u at a later time. Patient has [facility] and must met qualifications for a hospital bed per Medicare with a documented medical condition and be medically necessary. CM team will continue to follow.   Expected Discharge Date:                  Expected Discharge Plan:  Home/Self Care  In-House Referral:  NA  Discharge planning Services  CM Consult  Post Acute Care Choice:  NA Choice offered to:  NA  DME Arranged:  N/A DME Agency:  NA  HH Arranged:  NA HH Agency:  NA  Status of Service:  In process, will continue to follow  If discussed at Long Length of Stay Meetings, dates discussed:    Discharge Disposition: home/self care   Additional Comments:  09/26/18- 1430- Jennifer Gibbons RN, CM- f/u done with pt, daughter and son at bedside for transition of care needs- per pt she has RW and BSC at home, concern is regarding high bed at home, discussed that for insurance to cover hospital bed MD has to provide order and pt has to have qualifying medical condition and medical necessity  for insurance to cover. Post op CABG does not usually qualify pt for hospital bed- but instructed pt she would need to speak with MD- pt states that she has a step that daughter can look at that she may can use to assist her getting into bed. Also discussed maybe getting a recliner to use or renting a bed. CM will f/u, per pt she uses [facility] in [place], and has no other transition of care concerns at this time. CM  will continue to follow for any further needs.       Jennifer Gibbons RN, BSN Transitions of Care Unit 4E- RN Case Manager [phone] 09/26/2018, 2:28 PM

## 2018-09-26 NOTE — Discharge Summary (Addendum)
[address]       [place] [address]             [phone]      Physician Discharge Summary  Patient ID: Jennifer [name] MRN: [id number] DOB/AGE: 1956/05/12 62 y.o.  Admit date: 09/21/2018 Discharge date: 09/28/2018  Admission Diagnoses: Patient Active Problem List   Diagnosis Date Noted  . Exertional dyspnea   . Coronary artery disease involving native coronary artery of native heart with unstable angina pectoris (HCC)   Nonischemic cardiomyopathy Fibromyalgia Gastroesophageal reflux Sleep apnea Hypertension Hyperlipidemia Irritable bowel syndrome  Discharge Diagnoses:  Active Problems:   Exertional dyspnea   Coronary artery disease involving native coronary artery of native heart with unstable angina pectoris (HCC)   S/P CABG (coronary artery bypass graft)   Discharged Condition: good  HPI:   Jennifer [name] is a 62 yo woman with a past history of nonischemic cardiomyopathy diagnosed ~15 years ago with normal coronaries by cath at that time. She also has a past history of congestive heart failure, fibromyalgia, GERD, sleep apnea, hyperlipidemia, hypertension and IBS. She began having exertional chest pain almost a year ago. She would get an aching pain in the center of the chest. Relieved by rest. Some days would occur with relatively minimal activity, others would not have even with heavy activity.   She was dealing with her husband's illness during this time and did not seek medical attention. Recently she has been having more severe pain sometimes associated with nausea and dizziness. 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 She has never had rest or nocturnal symptoms and the symptoms have always resolved with rest.  An echo 09/07/2018 showed an EF of ~20%(down from a previous reported 40%). There was a question of low output aortic stenosis with an estimated valve area of 0.92- 1.05 cm2. Today she had an elective outpatient cath which revealed 90%  ostial LAD and 50% ostial circumflex lesions.   Right heart cath showed RA= 13, RV 41/11, PA 43/20, Wedge 27 Aortic valve gradient mean= 3.4 mmHg  Currently pain free  Hospital Course:   Jennifer [name] underwent a coronary bypass grafting x2 on 09/22/2018.  She tolerated the procedure well was transferred to the surgical ICU.  She was extubated in a timely manner.  Postop day 1 she remained in normal sinus rhythm she had a good cardiac index on milrinone and we were weaning Levophed as tolerated.  We added Coreg for beta blockade.  We encourage incentive spirometry for expected postoperative atelectasis.  We discontinued her chest tubes.  We can mobilize patient.  Postop day 2 she had some sinus tachycardia.  She had a brief run of NSVT which she remained asymptomatic.  Her creatinine remained stable.  We initiated Lovenox for DVT prophylaxis.  Postop day 3 she was normal sinus rhythm.  She was a bit hypotensive, however she was tolerating well.  We discontinued her Foley catheter.  She was stable to transfer to the telemetry floor for continued care.  Postop day 4 she states that she does get short of breath with exertion but otherwise she is doing well.  We increased her Coreg to 6.25 twice daily since she remained in sinus tachycardia with PVCs.  She did have a brief episode of atrial fibrillation the previous night, however she spontaneously converted back to normal sinus rhythm.  We were holding her diuretic at this time since she was auto diuresing.  We did  add in some diuretic on 12/10 to assist with diuresis. Her weight continued to trend down. She was ambulated with limited assistance. Her HR was better controlled and we arranged close follow-up with cardiology. Recommendations were made for her medications post-op. Her incision was healing well, she was tolerating room air, her chest tube sutures have been removed and she is ready for discharge home with her family.   Consults: None  Significant  Diagnostic Studies:   CLINICAL DATA:  Status post coronary bypass graft.  EXAM: CHEST - 2 VIEW  COMPARISON:  Radiograph of September 24, 2018.  FINDINGS: Stable cardiomegaly. Sternotomy wires are noted. No pneumothorax is noted. Mild bilateral pleural effusions are noted with associated atelectasis. Bony thorax is unremarkable.  IMPRESSION: Mild bilateral pleural effusions with associated atelectasis.   Electronically Signed   By: Marijo Conception, M.D.   On: 09/26/2018 09:18  Treatments:  NAME: Jennifer, [name] MEDICAL RECORD [id number] ACCOUNT [id number] DATE OF BIRTH:May 25, 1956 FACILITY: [facility] LOCATION: [facility] PHYSICIAN:Vicente Weidler Chaya Jan, MD  OPERATIVE REPORT  DATE OF PROCEDURE:  09/22/2018  PREOPERATIVE DIAGNOSIS:  Severe 2-vessel coronary disease with class 3 angina and nonischemic cardiomyopathy.  POSTOPERATIVE DIAGNOSIS:  Severe 2-vessel coronary disease with class angina and nonischemic cardiomyopathy.  PROCEDURE:  Median sternotomy, extracorporeal circulation, coronary artery bypass grafting x2 (left internal mammary artery to left anterior descending, saphenous vein graft to obtuse marginal 1).  SURGEON:  Modesto Charon, MD  ASSISTANT:  Nicholes Rough, PA  ANESTHESIA:  General.  FINDINGS:  Transesophageal echocardiography revealed severe left ventricular dysfunction with ejection fraction of 25%.  There was mild mitral regurgitation, no significant aortic stenosis.  Post-bypass slight improvement in wall motion with inotropic  support.  Intraoperative findings, cardiomegaly.  Good quality targets.  LIMA good quality.  Vein fair quality.  Discharge Exam: Blood pressure 139/74, pulse (!) 117, temperature 98.6 F (37 C), temperature source Oral, resp. rate (!) 23, height 5\' 2"  (1.575 m), weight 84.2 kg, SpO2 97 %.    General appearance: alert, cooperative and no distress Heart: sinus tachycardia Lungs: clear to auscultation  bilaterally Abdomen: soft, non-tender; bowel sounds normal; no masses,  no organomegaly Extremities: extremities normal, atraumatic, no cyanosis or edema Wound: clean and dry  Disposition: Discharge disposition: 01-Home or Self Care        Allergies as of 09/28/2018      Reactions   Poison Oak Extract Rash      Medication List    STOP taking these medications   diclofenac 75 MG EC tablet Commonly known as:  VOLTAREN   nitroGLYCERIN 0.4 MG SL tablet Commonly known as:  NITROSTAT   quinapril 10 MG tablet Commonly known as:  ACCUPRIL     TAKE these medications   aspirin 325 MG EC tablet Take 1 tablet (325 mg total) by mouth daily. What changed:    medication strength  how much to take  when to take this   atorvastatin 80 MG tablet Commonly known as:  LIPITOR Take 1 tablet (80 mg total) by mouth daily at 6 PM.   carvedilol 6.25 MG tablet Commonly known as:  COREG Take 1 tablet (6.25 mg total) by mouth 2 (two) times daily with a meal. What changed:    medication strength  how much to take   cholecalciferol 25 MCG (1000 UT) tablet Commonly known as:  VITAMIN D3 Take 1,000 Units by mouth daily.   DULoxetine 60 MG capsule Commonly known as:  CYMBALTA Take 60 mg by mouth 2 (  two) times daily.   gabapentin 300 MG capsule Commonly known as:  NEURONTIN Take 600 mg by mouth 3 (three) times daily.   hydrochlorothiazide 25 MG tablet Commonly known as:  HYDRODIURIL Take 25 mg by mouth daily.   losartan 25 MG tablet Commonly known as:  COZAAR Take 1 tablet (25 mg total) by mouth daily.   MUCINEX SINUS-MAX PO Take 2 tablets by mouth 2 (two) times daily as needed (sinus headaches).   oxyCODONE 5 MG immediate release tablet Commonly known as:  Oxy IR/ROXICODONE Take 1 tablet (5 mg total) by mouth every 6 (six) hours as needed for severe pain.   vitamin C 500 MG tablet Commonly known as:  ASCORBIC ACID Take 500 mg by mouth daily.      Follow-up  Information    Monico Blitz, MD. Call in 1 day(s).   Specialty:  Internal Medicine Contact information: [place] [address] [phone]        Herminio Commons, MD Follow up on 11/08/2018.   Specialty:  Cardiology Why:  Please arrive 15 minutes early for your 11:40am appointment. You will need to have fasting blood work at this visit.  Contact information: [place] [address] [phone]        Luke Kilroy,PA-C Follow up on 10/09/2018.   Why:  Please arrive 15 minutes early for your 2:30pm post hospital cardiology appointment  Contact information: [facility] [place] [address] [phone]       Melrose Nakayama, MD Follow up.   Specialty:  Cardiothoracic Surgery Why:  Your routine follow-up appointment is on 10/24/2018 at 11:45am. Please arrive at 11:15am for a chest xray located at [facility] which is on the first floor of our building Contact information: [address] [address] [address] [phone]          The patient has been discharged on:   1.Beta Blocker:  Yes [ yes  ]                              No   [   ]                              If No, reason:  2.Ace Inhibitor/ARB: Yes [ yes  ]                                     No  [    ]                                     If No, reason:  3.Statin:   Yes [  yes ]                  No  [   ]                  If No, reason:  4.Ecasa:  Yes  [ yes  ]                  No   [   ]  If No, reason:    Signed: Elgie Collard 09/28/2018, 10:09 AM

## 2018-09-26 NOTE — Discharge Instructions (Signed)

## 2018-09-26 NOTE — Progress Notes (Addendum)
      [address]       [place] [address]             [phone]      4 Days Post-Op Procedure(s) (LRB): CORONARY ARTERY BYPASS GRAFTING (CABG), ON PUMP, TIMES TWO, USING LEFT INTERNAL MAMMARY ARTERY AND ENDOSCOPICALLY HARVESTED RIGHT GREATER SAPHENOUS VEIN (N/A) TRANSESOPHAGEAL ECHOCARDIOGRAM (TEE) (N/A) Subjective: She states that she gets short of breath with exertion but is okay if just laying in the bed. Otherwise she has no complaints.  Objective: Vital signs in last 24 hours: Temp:  [97.7 F (36.5 C)-98 F (36.7 C)] 97.7 F (36.5 C) (12/10 0531) Pulse Rate:  [83-128] 104 (12/10 0531) Cardiac Rhythm: Normal sinus rhythm;Sinus tachycardia (12/10 0531) Resp:  [16-41] 20 (12/09 2046) BP: (80-116)/(54-86) 116/86 (12/10 0531) SpO2:  [93 %-99 %] 96 % (12/10 0531) Weight:  [86.7 kg] 86.7 kg (12/10 0531)     Intake/Output from previous day: 12/09 0701 - 12/10 0700 In: 7341 [P.O.:1470] Out: 250 [Urine:250] Intake/Output this shift: No intake/output data recorded.  General appearance: alert, cooperative and no distress Heart: irregular at times. Sinus tachycardia Lungs: clear to auscultation bilaterally Abdomen: soft, non-tender; bowel sounds normal; no masses,  no organomegaly Extremities: extremities normal, atraumatic, no cyanosis or edema Wound: clean and dry  Lab Results: Recent Labs    09/25/18 0316 09/26/18 0328  WBC 10.0 7.2  HGB 8.0* 8.1*  HCT 26.3* 25.3*  PLT 146* 191   BMET:  Recent Labs    09/25/18 0316 09/26/18 0328  NA 135 138  K 4.6 4.0  CL 104 105  CO2 24 25  GLUCOSE 86 101*  BUN 13 12  CREATININE 0.68 0.72  CALCIUM 8.3* 8.4*    PT/INR: No results for input(s): LABPROT, INR in the last 72 hours. ABG    Component Value Date/Time   PHART 7.323 (L) 09/22/2018 1746   HCO3 21.7 09/22/2018 1746   TCO2 25 09/23/2018 1634   ACIDBASEDEF 4.0 (H) 09/22/2018 1746   O2SAT 76.6 09/24/2018 0320   CBG (last 3)  Recent Labs      09/24/18 2059 09/25/18 0647 09/25/18 1115  GLUCAP 90 79 76    Assessment/Plan: S/P Procedure(s) (LRB): CORONARY ARTERY BYPASS GRAFTING (CABG), ON PUMP, TIMES TWO, USING LEFT INTERNAL MAMMARY ARTERY AND ENDOSCOPICALLY HARVESTED RIGHT GREATER SAPHENOUS VEIN (N/A) TRANSESOPHAGEAL ECHOCARDIOGRAM (TEE) (N/A)  1. CV-sinus tachycardia with PVCs. Had an brief episode of atrial fibrillation last night. Continue ASA, Lipitor, and coreg. Will increase coreg if BP remains stable-has been low. Order a mag level. 2. Pulm-tolerating room air with good oxygen saturation.  3. Renal-creatinine 0.72, potassium 4.0, will order a mag level 4. H and H stable 8.1/25.3, platelets 191k 5. Blood glucose well controlled. SSI discontinued  Plan: Work on rhythm control today-will keep EPW in place. Continue to use incentive spirometer and ambulate in the halls. Holding home diuretic-her weight continues to trend down. Will increase coreg if BP allows.     LOS: 5 days    Elgie Collard 09/26/2018 Patient seen and examined, agree with above Will increase coreg to 6.25 BID Restart ACE-I tomorrow CXR shows small bilateral effusions Continue cardiac rehab  [place]. Roxan Hockey, MD [facility] [phone]

## 2018-09-27 DIAGNOSIS — I255 Ischemic cardiomyopathy: Secondary | ICD-10-CM

## 2018-09-27 DIAGNOSIS — E7800 Pure hypercholesterolemia, unspecified: Secondary | ICD-10-CM

## 2018-09-27 DIAGNOSIS — I10 Essential (primary) hypertension: Secondary | ICD-10-CM

## 2018-09-27 MED ORDER — LOSARTAN POTASSIUM 25 MG PO TABS
25.0000 mg | ORAL_TABLET | Freq: Every day | ORAL | Status: DC
Start: 2018-09-27 — End: 2018-09-28
  Administered 2018-09-27 – 2018-09-28 (×2): 25 mg via ORAL
  Filled 2018-09-27 (×2): qty 1

## 2018-09-27 MED FILL — Verapamil HCl IV Soln 2.5 MG/ML: INTRAVENOUS | Qty: 2 | Status: AC

## 2018-09-27 NOTE — Progress Notes (Signed)
CARDIAC REHAB PHASE I   PRE:  Rate/Rhythm: 100 ST  BP:  Supine: 115/99  Sitting:   Standing:    SaO2: 94%RA  MODE:  Ambulation: 400 ft   POST:  Rate/Rhythm: 122 ST PACs  BP:  Supine:   Sitting: 100/83  Standing:    SaO2: 96%RA 1315-1345 Pt walked 400 ft on RA with rolling walker with steady gait. To recliner after walk. Used BSC prior to walk. Tolerated well. Call bell in reach. Pt stated she has walker for home.   Graylon Good, RN BSN  09/27/2018 1:40 PM

## 2018-09-27 NOTE — Plan of Care (Signed)

## 2018-09-27 NOTE — Progress Notes (Signed)
Epicardial wires removed per protocol with tips intact. Patient tolerated well and on bedrest.

## 2018-09-27 NOTE — Progress Notes (Signed)
Progress Note  Patient Name: Jennifer [name] Date of Encounter: 09/27/2018  Primary Cardiologist: Kate Sable, MD   Subjective   Denies any anginal CP or SOB  Inpatient Medications    Scheduled Meds: . acetaminophen  1,000 mg Oral Q6H   Or  . acetaminophen (TYLENOL) oral liquid 160 mg/5 mL  1,000 mg Per Tube Q6H  . aspirin EC  325 mg Oral Daily   Or  . aspirin  324 mg Per Tube Daily  . atorvastatin  80 mg Oral q1800  . bisacodyl  10 mg Oral Daily   Or  . bisacodyl  10 mg Rectal Daily  . carvedilol  6.25 mg Oral BID WC  . docusate sodium  200 mg Oral Daily  . DULoxetine  60 mg Oral BID  . enoxaparin (LOVENOX) injection  40 mg Subcutaneous QHS  . furosemide  40 mg Oral Daily  . gabapentin  600 mg Oral TID  . lisinopril  5 mg Oral Daily  . mouth rinse  15 mL Mouth Rinse BID  . moving right along book   Does not apply Once  . pantoprazole  40 mg Oral Daily  . potassium chloride  20 mEq Oral BID  . sodium chloride flush  3 mL Intravenous Q12H   Continuous Infusions: . sodium chloride     PRN Meds: sodium chloride, alum & mag hydroxide-simeth, magnesium hydroxide, ondansetron (ZOFRAN) IV, oxyCODONE, sodium chloride flush, zolpidem   Vital Signs    Vitals:   09/26/18 1715 09/26/18 2023 09/27/18 0334 09/27/18 0731  BP: 126/88 96/67 120/73 115/78  Pulse: (!) 109 91 (!) 107 (!) 106  Resp:  (!) 21 (!) 22 (!) 32  Temp:  97.8 F (36.6 C) 97.8 F (36.6 C) 98.2 F (36.8 C)  TempSrc:  Oral Oral Oral  SpO2:  96% 97% 97%  Weight:   85.5 kg   Height:        Intake/Output Summary (Last 24 hours) at 09/27/2018 0938 Last data filed at 09/26/2018 1200 Gross per 24 hour  Intake 200 ml  Output -  Net 200 ml   Filed Weights   09/25/18 0500 09/26/18 0531 09/27/18 0334  Weight: 87.6 kg 86.7 kg 85.5 kg    Telemetry    NSR - Personally Reviewed  ECG    No new EKG to review - Personally Reviewed  Physical Exam   GEN: No acute distress.   Neck: No  JVD Cardiac: RRR, no murmurs, rubs, or gallops.  Respiratory: Clear to auscultation bilaterally. GI: Soft, nontender, non-distended  MS: No edema; No deformity. Neuro:  Nonfocal  Psych: Normal affect   Labs    Chemistry Recent Labs  Lab 09/22/18 0020  09/24/18 0310 09/25/18 0316 09/26/18 0328  NA 140   < > 134* 135 138  K 3.9   < > 3.9 4.6 4.0  CL 108   < > 104 104 105  CO2 22   < > 24 24 25   GLUCOSE 101*   < > 136* 86 101*  BUN 21   < > 11 13 12   CREATININE 0.76   < > 0.61 0.68 0.72  CALCIUM 8.7*   < > 8.1* 8.3* 8.4*  PROT 6.0*  --   --   --   --   ALBUMIN 3.4*  --   --   --   --   AST 22  --   --   --   --   ALT 21  --   --   --   --  ALKPHOS 51  --   --   --   --   BILITOT 0.4  --   --   --   --   GFRNONAA >60   < > >60 >60 >60  GFRAA >60   < > >60 >60 >60  ANIONGAP 10   < > 6 7 8    < > = values in this interval not displayed.     Hematology Recent Labs  Lab 09/24/18 0310 09/25/18 0316 09/26/18 0328  WBC 11.3* 10.0 7.2  RBC 2.57* 2.55* 2.53*  HGB 8.4* 8.0* 8.1*  HCT 26.1* 26.3* 25.3*  MCV 101.6* 103.1* 100.0  MCH 32.7 31.4 32.0  MCHC 32.2 30.4 32.0  RDW 11.6 11.7 11.6  PLT 125* 146* 191    Cardiac EnzymesNo results for input(s): TROPONINI in the last 168 hours. No results for input(s): TROPIPOC in the last 168 hours.   BNPNo results for input(s): BNP, PROBNP in the last 168 hours.   DDimer No results for input(s): DDIMER in the last 168 hours.   Radiology    Dg Chest 2 View  Result Date: 09/26/2018 CLINICAL DATA:  Status post coronary bypass graft. EXAM: CHEST - 2 VIEW COMPARISON:  Radiograph of September 24, 2018. FINDINGS: Stable cardiomegaly. Sternotomy wires are noted. No pneumothorax is noted. Mild bilateral pleural effusions are noted with associated atelectasis. Bony thorax is unremarkable. IMPRESSION: Mild bilateral pleural effusions with associated atelectasis. Electronically Signed   By: Marijo Conception, M.D.   On: 09/26/2018 09:18     Cardiac Studies   TEE echo12/03/2018 Result status: Final result   Left atrium: Cavity is severely dilated.  Aortic valve: Mild valve thickening present. Mild valve calcification present.  Mitral valve: Mild leaflet thickening is present. Mild mitral annular calcification. Mild regurgitation.  Right ventricle: Normal cavity size, wall thickness and ejection fraction.  Tricuspid valve: Trace regurgitation.   2D echo 09/07/2018 Study Conclusions  - Left ventricle: The cavity size was moderately dilated. Wall   thickness was normal. Systolic function was severely reduced. The   estimated ejection fraction was in the range of 20% to 25%. - Regional wall motion abnormality: Akinesis of the basal-mid   anteroseptal myocardium. - Aortic valve: Mildly calcified annulus. Trileaflet; mildly   thickened leaflets. Valve area (VTI): 0.94 cm^2. Valve area   (Vmax): 1.05 cm^2. Valve area (Vmean): 0.92 cm^2. - Mitral valve: Mildly calcified annulus. Mildly thickened leaflets   . There was mild regurgitation. - Left atrium: The atrium was severely dilated. - Atrial septum: No defect or patent foramen ovale was identified.  Cardiac Cath 09/21/2018  Ost Cx to Prox Cx lesion is 50% stenosed.  Ost LM to Dist LM lesion is 90% stenosed.   Severe left main equivalent disease with 90% eccentric ostial LAD stenosis 50% ostial circumflex stenosis with a very short left main.  Mild right heart pressure elevation with mild pulmonary hypertension with a PA mean pressure at 32 mm.  No significant aortic valve stenosis.  RECOMMENDATION: Surgical consultation for CABG revascularization surgery to be done as soon as schedule allows possibly later today or tomorrow.  The patient will be kept n.p.o. until seen by surgery this afternoon.  If surgery is not to be done later today, initiate heparin 8 hours post sheath removal. Aspirin therapy alone in anticipation CABG  revascularization.    Patient Profile     62 y.o. female with a history of NICM w/ EF 40-45%, HTN, fibromyalgia admitted with CP and found  to have severe Ostial OAD and 50% ostial LCx by cath.   Assessment & Plan    1.  ASCAD - cath with LM equivalent disease with 90% eccentric oLAD and 50% oLCx s/p CABG.   -continue ASA 325mg  daily, high dose lipitor and carvediolol 6.25mg  BID  2.  HTN -BP controlled on exam -continue Carvedilol 6.25mg  BID -change Lisinopril to Losartan 25mg  daily to make transition to [facility] as outpt easier.  3.  IDCM - EF 29-25% by cath -continue Carvedilol -change Lisinopril to Losartan 25mg  daily with plan to transition to [facility] as outpt  4.  Hyperlipidemia -continue high dose statin -needs FLP and ALT in 6 weeks  [facility] will sign off.   Medication Recommendations:  Carvedilol 6.25mg  BID, Losartan 25mg  daily, ASA 325mg  daily then 81mg  daily in 3 months, Lipitor 80mg  daily.  Other recommendations (labs, testing, etc):  FLP and ALT in 6 weeks Follow up as an outpatient:  Followup with Kerin Ransom, PA in 2 weeks  For questions or updates, please contact [place] Please consult [email or web address] for contact info under Cardiology/STEMI.      Signed, Fransico Him, MD  09/27/2018, 9:38 AM

## 2018-09-27 NOTE — Progress Notes (Addendum)
      [address]       [place] [address]             [phone]      5 Days Post-Op Procedure(s) (LRB): CORONARY ARTERY BYPASS GRAFTING (CABG), ON PUMP, TIMES TWO, USING LEFT INTERNAL MAMMARY ARTERY AND ENDOSCOPICALLY HARVESTED RIGHT GREATER SAPHENOUS VEIN (N/A) TRANSESOPHAGEAL ECHOCARDIOGRAM (TEE) (N/A) Subjective: Feels a little better this morning. Not as short of breath.   Objective: Vital signs in last 24 hours: Temp:  [97.8 F (36.6 C)-98.2 F (36.8 C)] 98.2 F (36.8 C) (12/11 0731) Pulse Rate:  [91-109] 106 (12/11 0731) Cardiac Rhythm: Normal sinus rhythm (12/10 1900) Resp:  [21-32] 32 (12/11 0731) BP: (96-126)/(67-88) 115/78 (12/11 0731) SpO2:  [96 %-97 %] 97 % (12/11 0731) Weight:  [85.5 kg] 85.5 kg (12/11 0334)     Intake/Output from previous day: 12/10 0701 - 12/11 0700 In: 325 [P.O.:325] Out: -  Intake/Output this shift: No intake/output data recorded.  General appearance: alert, cooperative and no distress Heart: sinus tachycardia Lungs: clear to auscultation bilaterally Abdomen: soft, non-tender; bowel sounds normal; no masses,  no organomegaly Extremities: extremities normal, atraumatic, no cyanosis or edema Wound: clean and dry  Lab Results: Recent Labs    09/25/18 0316 09/26/18 0328  WBC 10.0 7.2  HGB 8.0* 8.1*  HCT 26.3* 25.3*  PLT 146* 191   BMET:  Recent Labs    09/25/18 0316 09/26/18 0328  NA 135 138  K 4.6 4.0  CL 104 105  CO2 24 25  GLUCOSE 86 101*  BUN 13 12  CREATININE 0.68 0.72  CALCIUM 8.3* 8.4*    PT/INR: No results for input(s): LABPROT, INR in the last 72 hours. ABG    Component Value Date/Time   PHART 7.323 (L) 09/22/2018 1746   HCO3 21.7 09/22/2018 1746   TCO2 25 09/23/2018 1634   ACIDBASEDEF 4.0 (H) 09/22/2018 1746   O2SAT 76.6 09/24/2018 0320   CBG (last 3)  Recent Labs    09/24/18 2059 09/25/18 0647 09/25/18 1115  GLUCAP 90 79 76    Assessment/Plan: S/P Procedure(s)  (LRB): CORONARY ARTERY BYPASS GRAFTING (CABG), ON PUMP, TIMES TWO, USING LEFT INTERNAL MAMMARY ARTERY AND ENDOSCOPICALLY HARVESTED RIGHT GREATER SAPHENOUS VEIN (N/A) TRANSESOPHAGEAL ECHOCARDIOGRAM (TEE) (N/A)  1. CV-sinus tachycardia. Continue ASA, Lipitor, and coreg. Electrolytes are okay. Start ACEI today.  2. Pulm-tolerating room air with good oxygen saturation 3. Renal-creatinine 0.72, Potassium 4.0, Magnesium 2.0 4. H and H 8.1/25.3, platelets 191k 5. Endo-blood glucose well controlled 6. Debility-needs to work on ambulation. Working with cardiac rehab.  Plan: Will discontinue EPW today. She is encouraged to use her incentive spirometer. She has help at home and plans to "take it easy" after discharge. Probably home in the morning.    LOS: 6 days    Elgie Collard 09/27/2018 Patient seen and examined, agree with above  Remo Lipps C. Roxan Hockey, MD [facility] [phone]

## 2018-09-27 NOTE — Care Management Important Message (Signed)
Important Message  Patient Details  Name: Jennifer [name] MRN: [id number] Date of Birth: [birth year]   Medicare Important Message Given:  Yes    Blonnie Maske P Yonis Carreon 09/27/2018, 12:23 PM

## 2018-09-28 MED ORDER — LOSARTAN POTASSIUM 25 MG PO TABS: 25.0000 mg | ORAL_TABLET | Freq: Every day | ORAL | 1 refills | Status: DC

## 2018-09-28 MED ORDER — OXYCODONE HCL 5 MG PO TABS: 5.0000 mg | ORAL_TABLET | Freq: Four times a day (QID) | ORAL | 0 refills | Status: DC | PRN

## 2018-09-28 MED ORDER — CARVEDILOL 6.25 MG PO TABS: 6.2500 mg | ORAL_TABLET | Freq: Two times a day (BID) | ORAL | 1 refills | Status: DC

## 2018-09-28 MED ORDER — ATORVASTATIN CALCIUM 80 MG PO TABS: 80.0000 mg | ORAL_TABLET | Freq: Every day | ORAL | 1 refills | Status: AC

## 2018-09-28 MED ORDER — ASPIRIN 325 MG PO TBEC: 325.0000 mg | DELAYED_RELEASE_TABLET | Freq: Every day | ORAL | 0 refills | Status: DC

## 2018-09-28 NOTE — Progress Notes (Signed)
      [address]       [place] [address]             [phone]      6 Days Post-Op Procedure(s) (LRB): CORONARY ARTERY BYPASS GRAFTING (CABG), ON PUMP, TIMES TWO, USING LEFT INTERNAL MAMMARY ARTERY AND ENDOSCOPICALLY HARVESTED RIGHT GREATER SAPHENOUS VEIN (N/A) TRANSESOPHAGEAL ECHOCARDIOGRAM (TEE) (N/A) Subjective: She is ready for discharge home this morning. Her daughter will be staying with her.  Objective: Vital signs in last 24 hours: Temp:  [97.9 F (36.6 C)-98.5 F (36.9 C)] 98.5 F (36.9 C) (12/12 0429) Pulse Rate:  [86-110] 110 (12/12 0429) Cardiac Rhythm: Normal sinus rhythm (12/12 0345) Resp:  [18-21] 20 (12/12 0429) BP: (92-131)/(63-91) 131/84 (12/12 0429) SpO2:  [95 %-96 %] 96 % (12/12 0429) Weight:  [84.2 kg] 84.2 kg (12/12 0429)     Intake/Output from previous day: 12/11 0701 - 12/12 0700 In: 240 [P.O.:240] Out: 850 [Urine:850] Intake/Output this shift: No intake/output data recorded.  General appearance: alert, cooperative and no distress Heart: sinus tachycardia Lungs: clear to auscultation bilaterally Abdomen: soft, non-tender; bowel sounds normal; no masses,  no organomegaly Extremities: extremities normal, atraumatic, no cyanosis or edema Wound: clean and dry  Lab Results: Recent Labs    09/26/18 0328  WBC 7.2  HGB 8.1*  HCT 25.3*  PLT 191   BMET:  Recent Labs    09/26/18 0328  NA 138  K 4.0  CL 105  CO2 25  GLUCOSE 101*  BUN 12  CREATININE 0.72  CALCIUM 8.4*    PT/INR: No results for input(s): LABPROT, INR in the last 72 hours. ABG    Component Value Date/Time   PHART 7.323 (L) 09/22/2018 1746   HCO3 21.7 09/22/2018 1746   TCO2 25 09/23/2018 1634   ACIDBASEDEF 4.0 (H) 09/22/2018 1746   O2SAT 76.6 09/24/2018 0320   CBG (last 3)  Recent Labs    09/25/18 1115  GLUCAP 76    Assessment/Plan: S/P Procedure(s) (LRB): CORONARY ARTERY BYPASS GRAFTING (CABG), ON PUMP, TIMES TWO, USING LEFT INTERNAL  MAMMARY ARTERY AND ENDOSCOPICALLY HARVESTED RIGHT GREATER SAPHENOUS VEIN (N/A) TRANSESOPHAGEAL ECHOCARDIOGRAM (TEE) (N/A)  1. CV-sinus tachycardia. Continue ASA, Lipitor, losartan, and coreg. Electrolytes are okay. EPW removed yesterday without issue 2. Pulm-tolerating room air with good oxygen saturation. CXR stable with bilateral mild pleural effusions. 3. Renal-creatinine 0.72, Potassium 4.0, Magnesium 2.0. I will discontinue lasix and start her back on her home dose of HCTZ on discharge.  4. H and H 8.1/25.3, platelets 191k 5. Endo-blood glucose well controlled 6. Debility-Working with cardiac rehab.  Plan: discharge today. She is following up with Cardiology in 2 weeks and our office in 4 weeks.    LOS: 7 days    Elgie Collard 09/28/2018

## 2018-09-28 NOTE — Progress Notes (Signed)
D/C instructions given to pt and daughter. Prescriptions given. Incision care reviewed. CT sutures removed. IV removed, clean and intact. All questions answered. Daughter to escort home.  Clyde Canterbury, RN

## 2018-09-28 NOTE — Progress Notes (Signed)
Ed completed with pt and daughter. Good reception. Will refer to [facility]. Encouraged low sodium and daily wts for now. [phone] Yves Dill CES, ACSM 10:43 AM 09/28/2018

## 2018-10-05 DIAGNOSIS — Z6831 Body mass index (BMI) 31.0-31.9, adult: Secondary | ICD-10-CM | POA: Diagnosis not present

## 2018-10-05 DIAGNOSIS — I255 Ischemic cardiomyopathy: Secondary | ICD-10-CM | POA: Diagnosis not present

## 2018-10-05 DIAGNOSIS — E7800 Pure hypercholesterolemia, unspecified: Secondary | ICD-10-CM | POA: Diagnosis not present

## 2018-10-05 DIAGNOSIS — Z299 Encounter for prophylactic measures, unspecified: Secondary | ICD-10-CM | POA: Diagnosis not present

## 2018-10-05 DIAGNOSIS — M797 Fibromyalgia: Secondary | ICD-10-CM | POA: Diagnosis not present

## 2018-10-05 DIAGNOSIS — I2510 Atherosclerotic heart disease of native coronary artery without angina pectoris: Secondary | ICD-10-CM | POA: Diagnosis not present

## 2018-10-09 ENCOUNTER — Ambulatory Visit: Payer: Medicare PPO | Admitting: Cardiology

## 2018-10-09 ENCOUNTER — Encounter: Payer: Self-pay | Admitting: Cardiology

## 2018-10-09 VITALS — BP 116/68 | HR 94 | Ht 62.0 in | Wt 171.0 lb

## 2018-10-09 DIAGNOSIS — I255 Ischemic cardiomyopathy: Secondary | ICD-10-CM | POA: Diagnosis not present

## 2018-10-09 DIAGNOSIS — E785 Hyperlipidemia, unspecified: Secondary | ICD-10-CM | POA: Diagnosis not present

## 2018-10-09 DIAGNOSIS — Z951 Presence of aortocoronary bypass graft: Secondary | ICD-10-CM

## 2018-10-09 DIAGNOSIS — M797 Fibromyalgia: Secondary | ICD-10-CM | POA: Diagnosis not present

## 2018-10-09 DIAGNOSIS — I10 Essential (primary) hypertension: Secondary | ICD-10-CM | POA: Insufficient documentation

## 2018-10-09 NOTE — Assessment & Plan Note (Signed)
Controlled.  

## 2018-10-09 NOTE — Assessment & Plan Note (Signed)
CABG x 2 with LIMA-LAD and SVG-OM 09/07/18

## 2018-10-09 NOTE — Assessment & Plan Note (Signed)
Check Lipids and LFTs 4 weeks

## 2018-10-09 NOTE — Patient Instructions (Signed)
Medication Instructions:  Your physician recommends that you continue on your current medications as directed. Please refer to the Current Medication list given to you today.  If you need a refill on your cardiac medications before your next appointment, please call your pharmacy.   Lab work: NONE  If you have labs (blood work) drawn today and your tests are completely normal, you will receive your results only by: . MyChart Message (if you have MyChart) OR . A paper copy in the mail If you have any lab test that is abnormal or we need to change your treatment, we will call you to review the results.  Testing/Procedures: NONE   Follow-Up: At [facility], you and your health needs are our priority.  As part of our continuing mission to provide you with exceptional heart care, we have created designated Provider Care Teams.  These Care Teams include your primary Cardiologist (physician) and Advanced Practice Providers (APPs -  Physician Assistants and Nurse Practitioners) who all work together to provide you with the care you need, when you need it. You will need a follow up appointment in 1 months.  Please call our office 2 months in advance to schedule this appointment.  You may see Suresh Koneswaran, MD or one of the following Advanced Practice Providers on your designated Care Team:   Brittany Strader, PA-C ([place] Office) . Michele Lenze, PA-C ([place] Office)  Any Other Special Instructions Will Be Listed Below (If Applicable). Thank you for choosing [facility]!     

## 2018-10-09 NOTE — Assessment & Plan Note (Signed)
EF 20% pre CABG

## 2018-10-09 NOTE — Progress Notes (Signed)
10/09/2018 Jennifer [name]   [birth year]  [id number]  Primary Physician Monico Blitz, MD Primary Cardiologist: Dr Bronson Ing   HPI: Ms. Heidel is a pleasant 62 year old female seen in the office today as a post CABG follow-up.  She has a history of prior nonischemic cardiomyopathy 15 years ago.  She was seen as an OP 08/24/18 with chest pain.  Exercise Myoview study done November 15 was high risk.  Echocardiogram November 21 showed severe LV dysfunction with an EF of 20-25%.  Catheterization done revealed 90% proximal LAD and a 50% circumflex stenosis.  She underwent bypass grafting x2 with an L IMA to the LAD and an SVG to the OM on 09/22/18.  She tolerated this well.  She is in the office today for follow-up.  She denies any tachycardia or dyspnea at rest.  Her appetites been good and she says she is sleeping okay.  She has had some neuropathic type pain in her legs but no significant lower extremity edema.   Current Outpatient Medications  Medication Sig Dispense Refill  . aspirin EC 325 MG EC tablet Take 1 tablet (325 mg total) by mouth daily. 30 tablet 0  . atorvastatin (LIPITOR) 80 MG tablet Take 1 tablet (80 mg total) by mouth daily at 6 PM. 30 tablet 1  . carvedilol (COREG) 6.25 MG tablet Take 1 tablet (6.25 mg total) by mouth 2 (two) times daily with a meal. 60 tablet 1  . cholecalciferol (VITAMIN D3) 25 MCG (1000 UT) tablet Take 1,000 Units by mouth daily.    . DULoxetine (CYMBALTA) 60 MG capsule Take 60 mg by mouth 2 (two) times daily.    Marland Kitchen gabapentin (NEURONTIN) 300 MG capsule Take 600 mg by mouth 3 (three) times daily.    . hydrochlorothiazide (HYDRODIURIL) 25 MG tablet Take 25 mg by mouth daily.    Marland Kitchen losartan (COZAAR) 25 MG tablet Take 1 tablet (25 mg total) by mouth daily. 30 tablet 1  . oxyCODONE (OXY IR/ROXICODONE) 5 MG immediate release tablet Take 1 tablet (5 mg total) by mouth every 6 (six) hours as needed for severe pain. 30 tablet 0  . Phenylephrine-APAP-guaiFENesin  (MUCINEX SINUS-MAX PO) Take 2 tablets by mouth 2 (two) times daily as needed (sinus headaches).    . pramipexole (MIRAPEX) 0.5 MG tablet Take 0.5 mg by mouth at bedtime.     . vitamin C (ASCORBIC ACID) 500 MG tablet Take 500 mg by mouth daily.     No current facility-administered medications for this visit.     Allergies  Allergen Reactions  . Poison Oak Extract Rash    Past Medical History:  Diagnosis Date  . Arthritis    "probably; maybe in my hands" (09/21/2018)  . Congestive heart failure (CHF) ([place])   . Fibromyalgia   . GERD (gastroesophageal reflux disease)   . Hypercholesteremia   . Hypertension   . IBS (irritable bowel syndrome)   . Mild sleep apnea    no CPAP (09/21/2018)  . Myocardial infarction [facility])    "was told I have had one; never knew about it" (09/21/2018)  . Nonischemic cardiomyopathy ([facility])    ECHO EF 40-45%, Septal Thickness,and mild global left ventricular dysfunction; stress test 03/2004 +VE, refersible defect; ECHO 03/2005 @[facility]  EF 55%    Social History   Socioeconomic History  . Marital status: Widowed    Spouse name: Not on file  . Number of children: Not on file  . Years of education: Not on file  .  Highest education level: Not on file  Occupational History  . Not on file  Social Needs  . Financial resource strain: Not on file  . Food insecurity:    Worry: Not on file    Inability: Not on file  . Transportation needs:    Medical: Not on file    Non-medical: Not on file  Tobacco Use  . Smoking status: Former Smoker    Packs/day: 1.00    Years: 30.00    Pack years: 30.00    Types: Cigarettes    Last attempt to quit: 1992    Years since quitting: 27.9  . Smokeless tobacco: Never Used  Substance and Sexual Activity  . Alcohol use: Not Currently    Frequency: Never  . Drug use: Never  . Sexual activity: Not Currently  Lifestyle  . Physical activity:    Days per week: Not on file    Minutes per session: Not on file  . Stress: Not on  file  Relationships  . Social connections:    Talks on phone: Not on file    Gets together: Not on file    Attends religious service: Not on file    Active member of club or organization: Not on file    Attends meetings of clubs or organizations: Not on file    Relationship status: Not on file  . Intimate partner violence:    Fear of current or ex partner: Not on file    Emotionally abused: Not on file    Physically abused: Not on file    Forced sexual activity: Not on file  Other Topics Concern  . Not on file  Social History Narrative  . Not on file     Family History  Problem Relation Age of Onset  . Diabetes Mellitus II Mother   . Heart disease Mother 25  . Colon cancer Father   . Alcohol abuse Father   . Depression Father   . Congestive Heart Failure Brother 32       probably dilated cardiomyopathy after PNA     Review of Systems: General: negative for chills, fever, night sweats or weight changes.  Cardiovascular: negative for chest pain, dyspnea on exertion, edema, orthopnea, palpitations, paroxysmal nocturnal dyspnea or shortness of breath Dermatological: negative for rash Respiratory: negative for cough or wheezing Urologic: negative for hematuria Abdominal: negative for nausea, vomiting, diarrhea, bright red blood per rectum, melena, or hematemesis Neurologic: negative for visual changes, syncope, or dizziness All other systems reviewed and are otherwise negative except as noted above.    Blood pressure 116/68, pulse 94, height 5\' 2"  (1.575 m), weight 171 lb (77.6 kg), SpO2 98 %.  General appearance: alert, cooperative, no distress and pale Neck: no JVD Lungs: clear to auscultation bilaterally Heart: regular rate and rhythm Extremities: no edema Neurologic: Grossly normal  EKG NSR, HR 100, infer/lat TWI  ASSESSMENT AND PLAN:   S/P CABG (coronary artery bypass graft) CABG x 2 with LIMA-LAD and SVG-OM 09/07/18  Ischemic cardiomyopathy EF 20% pre  CABG  Essential hypertension Controlled  Dyslipidemia, goal LDL below 70 Check Lipids and LFTs 4 weeks  Fibromyalgia .   PLAN  Same Rx for now. Consider f/u echo in 3 months, lipids and LFTs 4-6 weeks.  If her EF remains depressed-consider Entresto and possibly an ICD. F/U with Dr Roxan Hockey and Dr Bronson Ing as scheduled.   Kerin Ransom PA-C 10/09/2018 9:46 AM

## 2018-10-23 ENCOUNTER — Other Ambulatory Visit: Payer: Self-pay | Admitting: Thoracic Surgery (Cardiothoracic Vascular Surgery)

## 2018-10-23 DIAGNOSIS — Z951 Presence of aortocoronary bypass graft: Secondary | ICD-10-CM

## 2018-10-24 ENCOUNTER — Ambulatory Visit (INDEPENDENT_AMBULATORY_CARE_PROVIDER_SITE_OTHER): Payer: Self-pay | Admitting: Thoracic Surgery (Cardiothoracic Vascular Surgery)

## 2018-10-24 ENCOUNTER — Encounter: Payer: Self-pay | Admitting: Thoracic Surgery (Cardiothoracic Vascular Surgery)

## 2018-10-24 ENCOUNTER — Ambulatory Visit
Admission: RE | Admit: 2018-10-24 | Discharge: 2018-10-24 | Disposition: A | Discharge status: Home / Self Care | Payer: Medicare PPO | Source: Ambulatory Visit | Attending: Thoracic Surgery (Cardiothoracic Vascular Surgery) | Admitting: Thoracic Surgery (Cardiothoracic Vascular Surgery)

## 2018-10-24 ENCOUNTER — Other Ambulatory Visit: Payer: Self-pay

## 2018-10-24 VITALS — BP 94/65 | HR 84 | Resp 16 | Ht 62.0 in | Wt 175.0 lb

## 2018-10-24 DIAGNOSIS — Z951 Presence of aortocoronary bypass graft: Secondary | ICD-10-CM

## 2018-10-24 DIAGNOSIS — I2510 Atherosclerotic heart disease of native coronary artery without angina pectoris: Secondary | ICD-10-CM

## 2018-10-24 NOTE — Progress Notes (Signed)
[address]       [place] [address]             [phone]     HPI: Jennifer [name] returns for scheduled follow-up visit  Jennifer [name] is a 63 year old woman with a history of nonischemic cardiomyopathy, congestive heart failure, fibromyalgia, reflux, sleep apnea, hypertension, and hyperlipidemia.  She presented with worsening exertional chest pain.  She was found to have two-vessel coronary disease.  There was a question of low output aortic stenosis, but she had no significant gradient at catheterization.  She underwent coronary artery bypass grafting x2 on 09/22/2018.  Her postoperative course was unremarkable and she went home on day 6.  She has been feeling well.  She has had any recurrent angina.  She is walking on a regular basis.  [place] to a barn and back up hill to her house without any angina.  She is got some numbness on either side of the sternal incision.  She does not have any peripheral edema. Past Medical History:  Diagnosis Date  . Arthritis    "probably; maybe in my hands" (09/21/2018)  . Congestive heart failure (CHF) ([place])   . Fibromyalgia   . GERD (gastroesophageal reflux disease)   . Hypercholesteremia   . Hypertension   . IBS (irritable bowel syndrome)   . Mild sleep apnea    no CPAP (09/21/2018)  . Myocardial infarction [facility])    "was told I have had one; never knew about it" (09/21/2018)  . Nonischemic cardiomyopathy (HCC)    ECHO EF 40-45%, Septal Thickness,and mild global left ventricular dysfunction; stress test 03/2004 +VE, refersible defect; ECHO 03/2005 @[facility]  EF 55%    Current Outpatient Medications  Medication Sig Dispense Refill  . aspirin EC 325 MG EC tablet Take 1 tablet (325 mg total) by mouth daily. 30 tablet 0  . atorvastatin (LIPITOR) 80 MG tablet Take 1 tablet (80 mg total) by mouth daily at 6 PM. 30 tablet 1  . carvedilol (COREG) 6.25 MG tablet Take 1 tablet (6.25 mg total) by mouth 2  (two) times daily with a meal. 60 tablet 1  . cholecalciferol (VITAMIN D3) 25 MCG (1000 UT) tablet Take 1,000 Units by mouth daily.    . DULoxetine (CYMBALTA) 60 MG capsule Take 60 mg by mouth 2 (two) times daily.    Marland Kitchen gabapentin (NEURONTIN) 300 MG capsule Take 600 mg by mouth 3 (three) times daily.    . hydrochlorothiazide (HYDRODIURIL) 25 MG tablet Take 25 mg by mouth daily.    Marland Kitchen losartan (COZAAR) 25 MG tablet Take 1 tablet (25 mg total) by mouth daily. 30 tablet 1  . Phenylephrine-APAP-guaiFENesin (MUCINEX SINUS-MAX PO) Take 2 tablets by mouth 2 (two) times daily as needed (sinus headaches).    . pramipexole (MIRAPEX) 0.5 MG tablet Take 0.5 mg by mouth at bedtime.     . vitamin C (ASCORBIC ACID) 500 MG tablet Take 500 mg by mouth daily.     No current facility-administered medications for this visit.     Physical Exam BP 94/65 (BP Location: Right Arm, Patient Position: Sitting, Cuff Size: Large)   Pulse 84   Resp 16   Ht 5\' 2"  (1.575 m)   Wt 175 lb (79.4 kg)   SpO2 98% Comment: RA  BMI 32.10 kg/m  63 year old woman in no acute distress Alert oriented x3 with no focal deficits Lungs clear with equal breath sounds bilaterally  Cardiac regular rate and rhythm normal S1 and S2 Sternum stable, incision well-healed Leg incision well-healed No peripheral edema  Diagnostic Tests: CHEST - 2 VIEW  COMPARISON:  09/26/2018  FINDINGS: Sternotomy wires overlie normal cardiac silhouette. No effusion, infiltrate or pneumothorax. No acute osseous abnormality.  IMPRESSION: 1. No acute cardiopulmonary process. 2. No evidence of pulmonary edema.   Electronically Signed   By: Suzy Bouchard M.D.   On: 10/24/2018 11:55 I personally reviewed the chest x-ray and concur with the findings noted above  Impression: Jennifer [name] is a 63 year old woman with multiple cardiac risk factors who presented with angina.  She had a history of nonischemic cardiomyopathy with ejection fraction  approximately 20%.  Work-up this time she did have significant two-vessel coronary disease and underwent coronary bypass grafting x2 back on 09/22/2018.  Her postoperative course was uncomplicated.  She is doing extremely well at the present time.  She has minimal discomfort.  She is not taking any narcotics.  She does have some numbness around the incision which is to be expected.  She has not had any recurrent angina.  She has not had any shortness of breath or leg swelling.  She has noticed that she does not have much stamina.  That should improve over the next 3 to 4 weeks.  She may drive.  She was cautioned not to do any heavy lifting for another month.  Beyond that her activities are unrestricted.  Plan: Follow-up as scheduled with Dr. Bronson Ing.  I will be happy to see Jennifer [name] back anytime in the future if I can be of any further assistance with her care  Melrose Nakayama, MD [facility] [phone]

## 2018-11-05 DIAGNOSIS — J810 Acute pulmonary edema: Secondary | ICD-10-CM | POA: Diagnosis not present

## 2018-11-05 DIAGNOSIS — M797 Fibromyalgia: Secondary | ICD-10-CM | POA: Diagnosis not present

## 2018-11-05 DIAGNOSIS — T81718A Complication of other artery following a procedure, not elsewhere classified, initial encounter: Secondary | ICD-10-CM | POA: Diagnosis not present

## 2018-11-05 DIAGNOSIS — I10 Essential (primary) hypertension: Secondary | ICD-10-CM | POA: Diagnosis not present

## 2018-11-05 DIAGNOSIS — R600 Localized edema: Secondary | ICD-10-CM | POA: Diagnosis not present

## 2018-11-05 DIAGNOSIS — J90 Pleural effusion, not elsewhere classified: Secondary | ICD-10-CM | POA: Diagnosis not present

## 2018-11-05 DIAGNOSIS — I5021 Acute systolic (congestive) heart failure: Secondary | ICD-10-CM | POA: Diagnosis not present

## 2018-11-05 DIAGNOSIS — I255 Ischemic cardiomyopathy: Secondary | ICD-10-CM | POA: Diagnosis not present

## 2018-11-05 DIAGNOSIS — Z951 Presence of aortocoronary bypass graft: Secondary | ICD-10-CM | POA: Diagnosis not present

## 2018-11-05 DIAGNOSIS — I361 Nonrheumatic tricuspid (valve) insufficiency: Secondary | ICD-10-CM | POA: Diagnosis not present

## 2018-11-05 DIAGNOSIS — I82409 Acute embolism and thrombosis of unspecified deep veins of unspecified lower extremity: Secondary | ICD-10-CM | POA: Diagnosis not present

## 2018-11-05 DIAGNOSIS — I340 Nonrheumatic mitral (valve) insufficiency: Secondary | ICD-10-CM | POA: Diagnosis not present

## 2018-11-05 DIAGNOSIS — I2699 Other pulmonary embolism without acute cor pulmonale: Secondary | ICD-10-CM | POA: Diagnosis not present

## 2018-11-05 DIAGNOSIS — R0600 Dyspnea, unspecified: Secondary | ICD-10-CM | POA: Diagnosis not present

## 2018-11-05 DIAGNOSIS — E7800 Pure hypercholesterolemia, unspecified: Secondary | ICD-10-CM | POA: Diagnosis not present

## 2018-11-07 DIAGNOSIS — I340 Nonrheumatic mitral (valve) insufficiency: Secondary | ICD-10-CM | POA: Diagnosis not present

## 2018-11-07 DIAGNOSIS — I361 Nonrheumatic tricuspid (valve) insufficiency: Secondary | ICD-10-CM | POA: Diagnosis not present

## 2018-11-08 ENCOUNTER — Ambulatory Visit: Payer: Medicare PPO | Admitting: Cardiovascular Disease

## 2018-11-08 ENCOUNTER — Encounter: Payer: Self-pay | Admitting: Cardiovascular Disease

## 2018-11-08 VITALS — BP 88/64 | HR 92 | Ht 62.0 in | Wt 176.0 lb

## 2018-11-08 DIAGNOSIS — E785 Hyperlipidemia, unspecified: Secondary | ICD-10-CM | POA: Diagnosis not present

## 2018-11-08 DIAGNOSIS — G4730 Sleep apnea, unspecified: Secondary | ICD-10-CM | POA: Diagnosis not present

## 2018-11-08 DIAGNOSIS — I25110 Atherosclerotic heart disease of native coronary artery with unstable angina pectoris: Secondary | ICD-10-CM | POA: Diagnosis not present

## 2018-11-08 DIAGNOSIS — I2694 Multiple subsegmental pulmonary emboli without acute cor pulmonale: Secondary | ICD-10-CM

## 2018-11-08 DIAGNOSIS — I25708 Atherosclerosis of coronary artery bypass graft(s), unspecified, with other forms of angina pectoris: Secondary | ICD-10-CM | POA: Diagnosis not present

## 2018-11-08 DIAGNOSIS — I959 Hypotension, unspecified: Secondary | ICD-10-CM

## 2018-11-08 DIAGNOSIS — I10 Essential (primary) hypertension: Secondary | ICD-10-CM

## 2018-11-08 DIAGNOSIS — Z9289 Personal history of other medical treatment: Secondary | ICD-10-CM

## 2018-11-08 MED ORDER — POTASSIUM CHLORIDE CRYS ER 20 MEQ PO TBCR: 20.0000 meq | EXTENDED_RELEASE_TABLET | Freq: Every day | ORAL | 1 refills | Status: DC | PRN

## 2018-11-08 MED ORDER — FUROSEMIDE 40 MG PO TABS: 40.0000 mg | ORAL_TABLET | Freq: Every day | ORAL | 1 refills | Status: DC | PRN

## 2018-11-08 NOTE — Addendum Note (Signed)
Addended by: Julian Hy T on: 11/08/2018 12:35 PM   Modules accepted: Orders

## 2018-11-08 NOTE — Patient Instructions (Addendum)
Your physician recommends that you schedule a follow-up appointment in: [facility] physician has recommended you make the following change in your medication:   START LASIX 40 MG DAILY AS NEEDED FOR WEIGHT GAIN OF 2-3LBS OVERNIGHT  START POTASSIUM DAILY AS NEEDED TO TAKE WITH LASIX  You have been referred to [facility]  Your physician recommends that you return for lab work lipids - PLEASE FAST 6-8 HOURS PRIOR TO LAB WORK   Thank you for choosing [facility]!!

## 2018-11-08 NOTE — Progress Notes (Signed)
SUBJECTIVE: The patient presents for routine follow-up.  She underwent two-vessel CABG on 09/22/2018.  She is here with her daughter who moved with her husband from the [place] area in 2019 as the patient's husband passed away.  She was hospitalized over the weekend for bilateral pulmonary emboli at [facility].  Hospitalization records are not yet available.  She has been prescribed Xarelto 50 mg twice daily.  She was discharged yesterday.  She woke up this past Sunday morning with significant shortness of breath and then went to the [facility] ED where she was ultimately diagnosed.  She was apparently given IV Lasix which also helped to improve her symptoms.  She is hypotensive today.  She denies lightheadedness, dizziness, and syncope.  Her daughter says she was hypotensive yesterday as well.  She does feel fatigued.  She denies chest pain, shortness of breath, leg swelling, orthopnea, palpitations, and paroxysmal nocturnal dyspnea.  She has some numbness at the top of her chest but otherwise feels well.  Her daughter told me that her mother was diagnosed with sleep apnea several years ago and wonders if she should get another sleep study.    Social history: The patient is widowed.  Her daughter daughter was an Investment banker, operational in the [place] area.  The daughter's husband works in Engineer, technical sales.  Review of Systems: As per "subjective", otherwise negative.  Allergies  Allergen Reactions  . Poison Oak Extract Rash    Current Outpatient Medications  Medication Sig Dispense Refill  . aspirin EC 325 MG EC tablet Take 1 tablet (325 mg total) by mouth daily. 30 tablet 0  . atorvastatin (LIPITOR) 80 MG tablet Take 1 tablet (80 mg total) by mouth daily at 6 PM. 30 tablet 1  . carvedilol (COREG) 6.25 MG tablet Take 1 tablet (6.25 mg total) by mouth 2 (two) times daily with a meal. 60 tablet 1  . cholecalciferol (VITAMIN D3) 25 MCG (1000 UT) tablet Take 1,000 Units by mouth  daily.    . DULoxetine (CYMBALTA) 60 MG capsule Take 60 mg by mouth 2 (two) times daily.    Marland Kitchen gabapentin (NEURONTIN) 300 MG capsule Take 600 mg by mouth 3 (three) times daily.    . hydrochlorothiazide (HYDRODIURIL) 25 MG tablet Take 25 mg by mouth daily.    Marland Kitchen losartan (COZAAR) 25 MG tablet Take 1 tablet (25 mg total) by mouth daily. 30 tablet 1  . Phenylephrine-APAP-guaiFENesin (MUCINEX SINUS-MAX PO) Take 2 tablets by mouth 2 (two) times daily as needed (sinus headaches).    . pramipexole (MIRAPEX) 0.5 MG tablet Take 0.5 mg by mouth at bedtime.     . Rivaroxaban (XARELTO) 15 MG TABS tablet Take 15 mg by mouth 2 (two) times daily with a meal.    . vitamin C (ASCORBIC ACID) 500 MG tablet Take 500 mg by mouth daily.     No current facility-administered medications for this visit.     Past Medical History:  Diagnosis Date  . Arthritis    "probably; maybe in my hands" (09/21/2018)  . Congestive heart failure (CHF) ([place])   . Fibromyalgia   . GERD (gastroesophageal reflux disease)   . Hypercholesteremia   . Hypertension   . IBS (irritable bowel syndrome)   . Mild sleep apnea    no CPAP (09/21/2018)  . Myocardial infarction [facility])    "was told I have had one; never knew about it" (09/21/2018)  . Nonischemic cardiomyopathy (HCC)    ECHO EF  40-45%, Septal Thickness,and mild global left ventricular dysfunction; stress test 03/2004 +VE, refersible defect; ECHO 03/2005 @[facility]  EF 55%    Past Surgical History:  Procedure Laterality Date  . ANKLE FRACTURE SURGERY Left 2000  . BUNIONECTOMY Right 1990s?   "& removed part of my great toe due to fungus under nail"  . CARDIAC CATHETERIZATION  03/2005  . CORONARY ARTERY BYPASS GRAFT N/A 09/22/2018   Procedure: CORONARY ARTERY BYPASS GRAFTING (CABG), ON PUMP, TIMES TWO, USING LEFT INTERNAL MAMMARY ARTERY AND ENDOSCOPICALLY HARVESTED RIGHT GREATER SAPHENOUS VEIN;  Surgeon: Melrose Nakayama, MD;  Location: [facility];  Service: Open Heart Surgery;  Laterality:  N/A;  . DILATION AND CURETTAGE OF UTERUS    . FRACTURE SURGERY    . RIGHT/LEFT HEART CATH AND CORONARY ANGIOGRAPHY N/A 09/21/2018   Procedure: RIGHT/LEFT HEART CATH AND CORONARY ANGIOGRAPHY;  Surgeon: Troy Sine, MD;  Location: [facility];  Service: Cardiovascular;  Laterality: N/A;  . TEE WITHOUT CARDIOVERSION N/A 09/22/2018   Procedure: TRANSESOPHAGEAL ECHOCARDIOGRAM (TEE);  Surgeon: Melrose Nakayama, MD;  Location: [facility];  Service: Open Heart Surgery;  Laterality: N/A;  . TUBAL LIGATION      Social History   Socioeconomic History  . Marital status: Widowed    Spouse name: Not on file  . Number of children: Not on file  . Years of education: Not on file  . Highest education level: Not on file  Occupational History  . Not on file  Social Needs  . Financial resource strain: Not on file  . Food insecurity:    Worry: Not on file    Inability: Not on file  . Transportation needs:    Medical: Not on file    Non-medical: Not on file  Tobacco Use  . Smoking status: Former Smoker    Packs/day: 1.00    Years: 30.00    Pack years: 30.00    Types: Cigarettes    Last attempt to quit: 1992    Years since quitting: 28.0  . Smokeless tobacco: Never Used  Substance and Sexual Activity  . Alcohol use: Not Currently    Frequency: Never  . Drug use: Never  . Sexual activity: Not Currently  Lifestyle  . Physical activity:    Days per week: Not on file    Minutes per session: Not on file  . Stress: Not on file  Relationships  . Social connections:    Talks on phone: Not on file    Gets together: Not on file    Attends religious service: Not on file    Active member of club or organization: Not on file    Attends meetings of clubs or organizations: Not on file    Relationship status: Not on file  . Intimate partner violence:    Fear of current or ex partner: Not on file    Emotionally abused: Not on file    Physically abused: Not on file    Forced sexual  activity: Not on file  Other Topics Concern  . Not on file  Social History Narrative  . Not on file     Vitals:   11/08/18 1150  BP: (!) 88/64  Pulse: 92  SpO2: 97%  Weight: 176 lb (79.8 kg)  Height: 5\' 2"  (1.575 m)    Wt Readings from Last 3 Encounters:  11/08/18 176 lb (79.8 kg)  10/24/18 175 lb (79.4 kg)  10/09/18 171 lb (77.6 kg)     PHYSICAL EXAM General:  NAD HEENT: Normal. Neck: No JVD, no thyromegaly. Lungs: Clear to auscultation bilaterally with normal respiratory effort. CV: Well-healed scars on chest.  Regular rate and rhythm, normal S1/S2, no S3/S4, no murmur. No pretibial or periankle edema.   Abdomen: Soft, nontender, no distention.  Neurologic: Alert and oriented.  Psych: Normal affect. Skin: Normal. Musculoskeletal: No gross deformities.    ECG: Reviewed above under Subjective   Labs: Lab Results  Component Value Date/Time   K 4.0 09/26/2018 03:28 AM   BUN 12 09/26/2018 03:28 AM   BUN 20 09/19/2018 02:22 PM   CREATININE 0.72 09/26/2018 03:28 AM   ALT 21 09/22/2018 12:20 AM   HGB 8.1 (L) 09/26/2018 03:28 AM   HGB 13.1 09/19/2018 02:22 PM     Lipids: No results found for: LDLCALC, LDLDIRECT, CHOL, TRIG, HDL     ASSESSMENT AND PLAN: 1.  Coronary artery disease: status post two-vessel CABG in December 2019.  Symptomatically stable.  Continue aspirin, atorvastatin, and carvedilol.  Due to hypotension and consequent fatigue, I will stop losartan.  2.  Nonischemic cardiomyopathy/chronic systolic heart failure: LVEF 20 to 25% by TEE on 09/22/2018 and by transthoracic echocardiogram on 09/07/2018.  LVEF by transthoracic echocardiogram at [facility] on 11/07/2018 showed severely reduced left ventricular systolic function, LVEF 10 to 15%.  There was severe left ventricular dilatation and moderate to severe mitral regurgitation.  Right ventricular systolic function was mildly reduced.   Currently on HCTZ 25 mg daily.  She appears to be euvolemic and  is asymptomatic from the standpoint with NYHA class II symptoms.  She will need a follow-up echocardiogram within the next few months to assess for interval improvement in left ventricular systolic function.  If it remains severely reduced, I would refer to EP for ICD consideration.  Currently on carvedilol and losartan.  She is hypotensive and has some fatigue so I will stop losartan.  If blood pressures normalize in the future, I would consider resuming losartan at a lower dose versus Entresto.  3.  Hypertension: She is hypotensive so I will stop losartan.  4.  Hyperlipidemia: Continue atorvastatin 80 mg daily.  I will check lipids.  5.  Pulmonary embolism: Currently on Xarelto 15 mg twice daily.  Hospitalized recently and discharged on 11/07/2018.  6.  Sleep apnea: I will make a referral to a sleep specialist for a sleep study.    Disposition: Follow up 3 months  Time spent: 40 minutes, of which greater than 50% was spent reviewing symptoms, relevant blood tests and studies, and discussing management plan with the patient.   Kate Sable, M.D., F.A.C.C.

## 2018-11-13 ENCOUNTER — Telehealth: Payer: PRIVATE HEALTH INSURANCE

## 2018-11-13 ENCOUNTER — Ambulatory Visit: Payer: PRIVATE HEALTH INSURANCE

## 2018-11-13 DIAGNOSIS — Z0000 Encounter for general adult medical examination without abnormal findings: Secondary | ICD-10-CM

## 2018-11-13 NOTE — Telephone Encounter
Dr. Lajean Silvius,    Pt asked if she can schedule her mammogram before her march physical with you.  Can you place one for her?    [phone]

## 2018-11-14 DIAGNOSIS — Z299 Encounter for prophylactic measures, unspecified: Secondary | ICD-10-CM | POA: Diagnosis not present

## 2018-11-14 DIAGNOSIS — I82409 Acute embolism and thrombosis of unspecified deep veins of unspecified lower extremity: Secondary | ICD-10-CM | POA: Diagnosis not present

## 2018-11-14 DIAGNOSIS — Z6832 Body mass index (BMI) 32.0-32.9, adult: Secondary | ICD-10-CM | POA: Diagnosis not present

## 2018-11-14 DIAGNOSIS — E7800 Pure hypercholesterolemia, unspecified: Secondary | ICD-10-CM | POA: Diagnosis not present

## 2018-11-14 DIAGNOSIS — I509 Heart failure, unspecified: Secondary | ICD-10-CM | POA: Diagnosis not present

## 2018-11-14 DIAGNOSIS — I2699 Other pulmonary embolism without acute cor pulmonale: Secondary | ICD-10-CM | POA: Diagnosis not present

## 2018-11-14 DIAGNOSIS — Z789 Other specified health status: Secondary | ICD-10-CM | POA: Diagnosis not present

## 2018-11-14 DIAGNOSIS — I10 Essential (primary) hypertension: Secondary | ICD-10-CM | POA: Diagnosis not present

## 2018-11-14 NOTE — Telephone Encounter
Please advise 

## 2018-11-14 NOTE — Telephone Encounter
Called pt and gave update that order was placed.

## 2018-11-24 ENCOUNTER — Other Ambulatory Visit: Payer: Self-pay | Admitting: Physician Assistant

## 2018-11-27 ENCOUNTER — Inpatient Hospital Stay (HOSPITAL_COMMUNITY)
Admission: EM | Admit: 2018-11-27 | Discharge: 2018-12-07 | DRG: 286 | Disposition: A | Discharge status: Home / Self Care | Payer: Medicare PPO | Attending: Internal Medicine | Admitting: Internal Medicine

## 2018-11-27 ENCOUNTER — Other Ambulatory Visit: Payer: Self-pay | Admitting: Physician Assistant

## 2018-11-27 ENCOUNTER — Telehealth: Payer: Self-pay | Admitting: *Deleted

## 2018-11-27 ENCOUNTER — Encounter (HOSPITAL_COMMUNITY): Payer: Self-pay | Admitting: Emergency Medicine

## 2018-11-27 ENCOUNTER — Ambulatory Visit: Payer: Medicare PPO | Admitting: Cardiology

## 2018-11-27 ENCOUNTER — Encounter: Payer: Self-pay | Admitting: Cardiology

## 2018-11-27 ENCOUNTER — Emergency Department (HOSPITAL_COMMUNITY): Payer: Medicare PPO

## 2018-11-27 ENCOUNTER — Other Ambulatory Visit: Payer: Self-pay

## 2018-11-27 ENCOUNTER — Encounter: Payer: Self-pay | Admitting: *Deleted

## 2018-11-27 DIAGNOSIS — I252 Old myocardial infarction: Secondary | ICD-10-CM

## 2018-11-27 DIAGNOSIS — Z87891 Personal history of nicotine dependence: Secondary | ICD-10-CM

## 2018-11-27 DIAGNOSIS — R0602 Shortness of breath: Secondary | ICD-10-CM | POA: Diagnosis present

## 2018-11-27 DIAGNOSIS — I110 Hypertensive heart disease with heart failure: Secondary | ICD-10-CM | POA: Diagnosis present

## 2018-11-27 DIAGNOSIS — Z8249 Family history of ischemic heart disease and other diseases of the circulatory system: Secondary | ICD-10-CM

## 2018-11-27 DIAGNOSIS — D7589 Other specified diseases of blood and blood-forming organs: Secondary | ICD-10-CM | POA: Diagnosis present

## 2018-11-27 DIAGNOSIS — Z951 Presence of aortocoronary bypass graft: Secondary | ICD-10-CM | POA: Diagnosis not present

## 2018-11-27 DIAGNOSIS — G629 Polyneuropathy, unspecified: Secondary | ICD-10-CM | POA: Diagnosis present

## 2018-11-27 DIAGNOSIS — M797 Fibromyalgia: Secondary | ICD-10-CM | POA: Diagnosis present

## 2018-11-27 DIAGNOSIS — Z6831 Body mass index (BMI) 31.0-31.9, adult: Secondary | ICD-10-CM

## 2018-11-27 DIAGNOSIS — N179 Acute kidney failure, unspecified: Secondary | ICD-10-CM

## 2018-11-27 DIAGNOSIS — Z7982 Long term (current) use of aspirin: Secondary | ICD-10-CM

## 2018-11-27 DIAGNOSIS — I5082 Biventricular heart failure: Secondary | ICD-10-CM | POA: Diagnosis present

## 2018-11-27 DIAGNOSIS — R627 Adult failure to thrive: Secondary | ICD-10-CM | POA: Diagnosis present

## 2018-11-27 DIAGNOSIS — R0683 Snoring: Secondary | ICD-10-CM | POA: Diagnosis present

## 2018-11-27 DIAGNOSIS — F329 Major depressive disorder, single episode, unspecified: Secondary | ICD-10-CM | POA: Diagnosis present

## 2018-11-27 DIAGNOSIS — R000 Tachycardia, unspecified: Secondary | ICD-10-CM | POA: Diagnosis not present

## 2018-11-27 DIAGNOSIS — Z7901 Long term (current) use of anticoagulants: Secondary | ICD-10-CM

## 2018-11-27 DIAGNOSIS — I471 Supraventricular tachycardia: Secondary | ICD-10-CM | POA: Diagnosis not present

## 2018-11-27 DIAGNOSIS — E785 Hyperlipidemia, unspecified: Secondary | ICD-10-CM | POA: Diagnosis present

## 2018-11-27 DIAGNOSIS — I509 Heart failure, unspecified: Secondary | ICD-10-CM

## 2018-11-27 DIAGNOSIS — E876 Hypokalemia: Secondary | ICD-10-CM | POA: Diagnosis present

## 2018-11-27 DIAGNOSIS — I428 Other cardiomyopathies: Secondary | ICD-10-CM | POA: Diagnosis present

## 2018-11-27 DIAGNOSIS — E669 Obesity, unspecified: Secondary | ICD-10-CM | POA: Diagnosis present

## 2018-11-27 DIAGNOSIS — Q256 Stenosis of pulmonary artery: Secondary | ICD-10-CM

## 2018-11-27 DIAGNOSIS — I2699 Other pulmonary embolism without acute cor pulmonale: Secondary | ICD-10-CM

## 2018-11-27 DIAGNOSIS — I5023 Acute on chronic systolic (congestive) heart failure: Secondary | ICD-10-CM | POA: Diagnosis not present

## 2018-11-27 DIAGNOSIS — I50810 Right heart failure, unspecified: Secondary | ICD-10-CM | POA: Diagnosis not present

## 2018-11-27 DIAGNOSIS — Z79899 Other long term (current) drug therapy: Secondary | ICD-10-CM

## 2018-11-27 DIAGNOSIS — I429 Cardiomyopathy, unspecified: Secondary | ICD-10-CM | POA: Diagnosis not present

## 2018-11-27 DIAGNOSIS — K589 Irritable bowel syndrome without diarrhea: Secondary | ICD-10-CM | POA: Diagnosis present

## 2018-11-27 DIAGNOSIS — I340 Nonrheumatic mitral (valve) insufficiency: Secondary | ICD-10-CM | POA: Diagnosis not present

## 2018-11-27 DIAGNOSIS — R0609 Other forms of dyspnea: Secondary | ICD-10-CM | POA: Diagnosis not present

## 2018-11-27 DIAGNOSIS — I255 Ischemic cardiomyopathy: Secondary | ICD-10-CM

## 2018-11-27 DIAGNOSIS — K219 Gastro-esophageal reflux disease without esophagitis: Secondary | ICD-10-CM | POA: Diagnosis present

## 2018-11-27 DIAGNOSIS — I10 Essential (primary) hypertension: Secondary | ICD-10-CM | POA: Diagnosis not present

## 2018-11-27 DIAGNOSIS — R0600 Dyspnea, unspecified: Secondary | ICD-10-CM

## 2018-11-27 DIAGNOSIS — G4730 Sleep apnea, unspecified: Secondary | ICD-10-CM | POA: Diagnosis present

## 2018-11-27 DIAGNOSIS — Z91048 Other nonmedicinal substance allergy status: Secondary | ICD-10-CM

## 2018-11-27 DIAGNOSIS — E7800 Pure hypercholesterolemia, unspecified: Secondary | ICD-10-CM | POA: Diagnosis present

## 2018-11-27 DIAGNOSIS — I361 Nonrheumatic tricuspid (valve) insufficiency: Secondary | ICD-10-CM | POA: Diagnosis not present

## 2018-11-27 DIAGNOSIS — I2510 Atherosclerotic heart disease of native coronary artery without angina pectoris: Secondary | ICD-10-CM | POA: Diagnosis present

## 2018-11-27 DIAGNOSIS — Z86711 Personal history of pulmonary embolism: Secondary | ICD-10-CM

## 2018-11-27 DIAGNOSIS — I081 Rheumatic disorders of both mitral and tricuspid valves: Secondary | ICD-10-CM | POA: Diagnosis present

## 2018-11-27 DIAGNOSIS — I5043 Acute on chronic combined systolic (congestive) and diastolic (congestive) heart failure: Secondary | ICD-10-CM

## 2018-11-27 DIAGNOSIS — G4733 Obstructive sleep apnea (adult) (pediatric): Secondary | ICD-10-CM | POA: Diagnosis not present

## 2018-11-27 HISTORY — DX: Dyspnea, unspecified: R06.00

## 2018-11-27 HISTORY — DX: Other pulmonary embolism without acute cor pulmonale: I26.99

## 2018-11-27 LAB — CBC WITH DIFFERENTIAL/PLATELET
ABS IMMATURE GRANULOCYTES: 0.02 10*3/uL (ref 0.00–0.07)
BASOS ABS: 0.1 10*3/uL (ref 0.0–0.1)
Basophils Relative: 1 %
Eosinophils Absolute: 0.3 10*3/uL (ref 0.0–0.5)
Eosinophils Relative: 3 %
HCT: 39.7 % (ref 36.0–46.0)
Hemoglobin: 12.9 g/dL (ref 12.0–15.0)
Immature Granulocytes: 0 %
Lymphocytes Relative: 38 %
Lymphs Abs: 3.4 10*3/uL (ref 0.7–4.0)
MCH: 31.7 pg (ref 26.0–34.0)
MCHC: 32.5 g/dL (ref 30.0–36.0)
MCV: 97.5 fL (ref 80.0–100.0)
Monocytes Absolute: 0.6 10*3/uL (ref 0.1–1.0)
Monocytes Relative: 7 %
NEUTROS PCT: 51 %
NRBC: 0.3 % — AB (ref 0.0–0.2)
Neutro Abs: 4.6 10*3/uL (ref 1.7–7.7)
Platelets: 233 10*3/uL (ref 150–400)
RBC: 4.07 MIL/uL (ref 3.87–5.11)
RDW: 12.6 % (ref 11.5–15.5)
WBC: 9 10*3/uL (ref 4.0–10.5)

## 2018-11-27 LAB — COMPREHENSIVE METABOLIC PANEL
ALBUMIN: 3.6 g/dL (ref 3.5–5.0)
ALT: 71 U/L — ABNORMAL HIGH (ref 0–44)
AST: 49 U/L — ABNORMAL HIGH (ref 15–41)
Alkaline Phosphatase: 112 U/L (ref 38–126)
Anion gap: 12 (ref 5–15)
BUN: 30 mg/dL — ABNORMAL HIGH (ref 8–23)
CO2: 22 mmol/L (ref 22–32)
Calcium: 9 mg/dL (ref 8.9–10.3)
Chloride: 104 mmol/L (ref 98–111)
Creatinine, Ser: 1.05 mg/dL — ABNORMAL HIGH (ref 0.44–1.00)
GFR calc Af Amer: >60 mL/min (ref >60)
GFR calc non Af Amer: 56 mL/min — ABNORMAL LOW (ref >60)
Glucose, Bld: 100 mg/dL — ABNORMAL HIGH (ref 70–99)
Potassium: 3.5 mmol/L (ref 3.5–5.1)
Sodium: 138 mmol/L (ref 135–145)
Total Bilirubin: 0.9 mg/dL (ref 0.3–1.2)
Total Protein: 6.9 g/dL (ref 6.5–8.1)

## 2018-11-27 LAB — I-STAT TROPONIN, ED: Troponin i, poc: 0.02 ng/mL (ref 0.00–0.08)

## 2018-11-27 LAB — BRAIN NATRIURETIC PEPTIDE: B Natriuretic Peptide: 1311.1 pg/mL — ABNORMAL HIGH (ref 0.0–100.0)

## 2018-11-27 MED ORDER — FUROSEMIDE 10 MG/ML IJ SOLN
40.0000 mg | Freq: Once | INTRAMUSCULAR | Status: DC
  Filled 2018-11-27: qty 4

## 2018-11-27 NOTE — Telephone Encounter (Signed)
Needs to be evaluated clinically. Check APP availability at any office.

## 2018-11-27 NOTE — Assessment & Plan Note (Signed)
EF 20% pre CABG

## 2018-11-27 NOTE — ED Provider Notes (Signed)
[place] EMERGENCY DEPARTMENT Provider Note   CSN: [id number] Arrival date & time: 11/27/18  1639     History   Chief Complaint Chief Complaint  Patient presents with  . Shortness of Breath    HPI Jennifer [name] is a 63 y.o. female.  The history is provided by the patient.  Shortness of Breath  Severity:  Moderate Onset quality:  Gradual Timing:  Constant Progression:  Worsening Chronicity:  Recurrent Context: not URI   Context comment:  Recent CABG and recent b/l PE on xarelto who presents with worsening SOB over the last several days. Takes lasix prn and took 120 mg yesterday,. Saw cardiology today who recommend ED workup. No imaging or echo since CABG. Relieved by:  Rest Worsened by:  Exertion Associated symptoms: no abdominal pain, no chest pain, no cough, no ear pain, no fever, no rash, no sore throat, no sputum production, no swollen glands, no vomiting and no wheezing   Risk factors: hx of PE/DVT     Past Medical History:  Diagnosis Date  . Arthritis    "probably; maybe in my hands" (09/21/2018)  . Congestive heart failure (CHF) ([place])   . Fibromyalgia   . GERD (gastroesophageal reflux disease)   . Hypercholesteremia   . Hypertension   . IBS (irritable bowel syndrome)   . Mild sleep apnea    no CPAP (09/21/2018)  . Myocardial infarction [facility])    "was told I have had one; never knew about it" (09/21/2018)  . Nonischemic cardiomyopathy (HCC)    ECHO EF 40-45%, Septal Thickness,and mild global left ventricular dysfunction; stress test 03/2004 +VE, refersible defect; ECHO 03/2005 @[facility]  EF 55%  . Pulmonary embolus [facility])     Patient Active Problem List   Diagnosis Date Noted  . Pulmonary embolism ([place]) 11/27/2018  . Ischemic cardiomyopathy 10/09/2018  . Essential hypertension 10/09/2018  . Dyslipidemia, goal LDL below 70 10/09/2018  . Fibromyalgia 10/09/2018  . S/P CABG (coronary artery bypass graft) 09/22/2018  . Exertional dyspnea      Past Surgical History:  Procedure Laterality Date  . ANKLE FRACTURE SURGERY Left 2000  . BUNIONECTOMY Right 1990s?   "& removed part of my great toe due to fungus under nail"  . CARDIAC CATHETERIZATION  03/2005  . CORONARY ARTERY BYPASS GRAFT N/A 09/22/2018   Procedure: CORONARY ARTERY BYPASS GRAFTING (CABG), ON PUMP, TIMES TWO, USING LEFT INTERNAL MAMMARY ARTERY AND ENDOSCOPICALLY HARVESTED RIGHT GREATER SAPHENOUS VEIN;  Surgeon: Melrose Nakayama, MD;  Location: [place];  Service: Open Heart Surgery;  Laterality: N/A;  . DILATION AND CURETTAGE OF UTERUS    . FRACTURE SURGERY    . RIGHT/LEFT HEART CATH AND CORONARY ANGIOGRAPHY N/A 09/21/2018   Procedure: RIGHT/LEFT HEART CATH AND CORONARY ANGIOGRAPHY;  Surgeon: Troy Sine, MD;  Location: [place] CV LAB;  Service: Cardiovascular;  Laterality: N/A;  . TEE WITHOUT CARDIOVERSION N/A 09/22/2018   Procedure: TRANSESOPHAGEAL ECHOCARDIOGRAM (TEE);  Surgeon: Melrose Nakayama, MD;  Location: [place];  Service: Open Heart Surgery;  Laterality: N/A;  . TUBAL LIGATION       OB History   No obstetric history on file.      Home Medications    Prior to Admission medications   Medication Sig Start Date End Date Taking? Authorizing Provider  aspirin EC 325 MG EC tablet Take 1 tablet (325 mg total) by mouth daily. Patient taking differently: Take 325 mg by mouth every evening.  09/28/18  Yes Nicholes Rough  N, PA-C  atorvastatin (LIPITOR) 80 MG tablet Take 1 tablet (80 mg total) by mouth daily at 6 PM. 09/28/18  Yes Harriet Pho, Tessa N, PA-C  carvedilol (COREG) 6.25 MG tablet Take 1 tablet (6.25 mg total) by mouth 2 (two) times daily with a meal. 09/28/18  Yes Harriet Pho, Tessa N, PA-C  cholecalciferol (VITAMIN D3) 25 MCG (1000 UT) tablet Take 1,000 Units by mouth daily.   Yes [provider]  DULoxetine (CYMBALTA) 60 MG capsule Take 60 mg by mouth 2 (two) times daily.   Yes [provider]  furosemide (LASIX) 40 MG tablet Take 1  tablet (40 mg total) by mouth daily as needed. Patient taking differently: Take 40 mg by mouth daily as needed for fluid.  11/08/18 02/06/19 Yes Herminio Commons, MD  gabapentin (NEURONTIN) 300 MG capsule Take 600 mg by mouth 3 (three) times daily.   Yes [provider]  hydrochlorothiazide (HYDRODIURIL) 25 MG tablet Take 25 mg by mouth daily.   Yes [provider]  potassium chloride SA (K-DUR,KLOR-CON) 20 MEQ tablet Take 1 tablet (20 mEq total) by mouth daily as needed. Patient taking differently: Take 20 mEq by mouth daily as needed (when taking lasix).  11/08/18  Yes Herminio Commons, MD  pramipexole (MIRAPEX) 0.5 MG tablet Take 0.5 mg by mouth at bedtime.    Yes [provider]  Rivaroxaban (XARELTO) 15 MG TABS tablet Take 15 mg by mouth 2 (two) times daily with a meal.   Yes [provider]  vitamin C (ASCORBIC ACID) 500 MG tablet Take 500 mg by mouth daily.   Yes [provider]    Family History Family History  Problem Relation Age of Onset  . Diabetes Mellitus II Mother   . Heart disease Mother 33  . Colon cancer Father   . Alcohol abuse Father   . Depression Father   . Congestive Heart Failure Brother 43       probably dilated cardiomyopathy after PNA    Social History Social History   Tobacco Use  . Smoking status: Former Smoker    Packs/day: 1.00    Years: 30.00    Pack years: 30.00    Types: Cigarettes    Last attempt to quit: 1992    Years since quitting: 28.1  . Smokeless tobacco: Never Used  Substance Use Topics  . Alcohol use: Not Currently    Frequency: Never  . Drug use: Never     Allergies   Poison oak extract   Review of Systems Review of Systems  Constitutional: Negative for chills and fever.  HENT: Negative for ear pain and sore throat.   Eyes: Negative for pain and visual disturbance.  Respiratory: Positive for shortness of breath. Negative for cough, sputum production and wheezing.     Cardiovascular: Negative for chest pain and palpitations.  Gastrointestinal: Negative for abdominal pain and vomiting.  Genitourinary: Negative for dysuria and hematuria.  Musculoskeletal: Negative for arthralgias and back pain.  Skin: Negative for color change and rash.  Neurological: Negative for seizures and syncope.  All other systems reviewed and are negative.    Physical Exam Updated Vital Signs  ED Triage Vitals  Enc Vitals Group     BP 11/27/18 1650 97/66     Pulse Rate 11/27/18 1650 (!) 110     Resp 11/27/18 1650 20     Temp 11/27/18 1650 97.7 F (36.5 C)     Temp Source 11/27/18 2026 Oral  SpO2 11/27/18 1650 97 %     Weight 11/27/18 1648 180 lb (81.6 kg)     Height 11/27/18 1648 5\' 2"  (1.575 m)     Head Circumference --      Peak Flow --      Pain Score 11/27/18 1648 0     Pain Loc --      Pain Edu? --      Excl. in [place]? --     Physical Exam Vitals signs and nursing note reviewed.  Constitutional:      General: She is not in acute distress.    Appearance: She is well-developed. She is obese. She is not ill-appearing.  HENT:     Head: Normocephalic and atraumatic.  Eyes:     Conjunctiva/sclera: Conjunctivae normal.     Pupils: Pupils are equal, round, and reactive to light.  Neck:     Musculoskeletal: Neck supple.  Cardiovascular:     Rate and Rhythm: Normal rate and regular rhythm.     Pulses: Normal pulses.     Heart sounds: Normal heart sounds. No murmur.  Pulmonary:     Effort: Pulmonary effort is normal. No tachypnea or respiratory distress.     Breath sounds: Decreased breath sounds and rales present. No wheezing or rhonchi.  Abdominal:     Palpations: Abdomen is soft.     Tenderness: There is no abdominal tenderness.  Musculoskeletal:     Right lower leg: Edema present.     Left lower leg: Edema present.  Skin:    General: Skin is warm and dry.     Capillary Refill: Capillary refill takes less than 2 seconds.  Neurological:     General:  No focal deficit present.     Mental Status: She is alert.  Psychiatric:        Mood and Affect: Mood normal.      ED Treatments / Results  Labs (all labs ordered are listed, but only abnormal results are displayed) Labs Reviewed  CBC WITH DIFFERENTIAL/PLATELET - Abnormal; Notable for the following components:      Result Value   nRBC 0.3 (*)    All other components within normal limits  COMPREHENSIVE METABOLIC PANEL - Abnormal; Notable for the following components:   Glucose, Bld 100 (*)    BUN 30 (*)    Creatinine, Ser 1.05 (*)    AST 49 (*)    ALT 71 (*)    GFR calc non Af Amer 56 (*)    All other components within normal limits  BRAIN NATRIURETIC PEPTIDE - Abnormal; Notable for the following components:   B Natriuretic Peptide 1,311.1 (*)    All other components within normal limits  I-STAT TROPONIN, ED    EKG EKG Interpretation  Date/Time:  Monday November 27 2018 16:44:41 EST Ventricular Rate:  109 PR Interval:  158 QRS Duration: 96 QT Interval:  346 QTC Calculation: 465 R Axis:   66 Text Interpretation:  Sinus tachycardia Anterior infarct , age undetermined Abnormal ECG Confirmed by Lennice Sites [phone]) on 11/27/2018 8:40:56 PM   Radiology Dg Chest 2 View  Result Date: 11/27/2018 CLINICAL DATA:  Shortness of breath for 4-5 days. EXAM: CHEST - 2 VIEW COMPARISON:  Single-view of the chest and CT chest 11/05/2018. FINDINGS: The patient is status post CABG. There is cardiomegaly. Small right pleural effusion is noted. No left effusion. Lungs are clear. No pneumothorax. No acute bony abnormality. IMPRESSION: Cardiomegaly without edema. Small right pleural effusion. Electronically Signed  By: Inge Rise M.D.   On: 11/27/2018 18:20    Procedures Procedures (including critical care time)  Medications Ordered in ED Medications  furosemide (LASIX) injection 40 mg (has no administration in time range)     Initial Impression / Assessment and Plan / ED  Course  I have reviewed the triage vital signs and the nursing notes.  Pertinent labs & imaging results that were available during my care of the patient were reviewed by me and considered in my medical decision making (see chart for details).     Jennifer [name] is a 63 year old female status post recent CABG, status post recent bilateral PE on Xarelto who presents to the ED with gradual shortness of breath over the last several days.  Saw cardiology today.  Patient states that she takes Lasix as needed and took 120 mg yesterday.  Has had some improvement.  Has not had any echocardiogram since her CABG.  Had an EF of 20 at that time.  Patient has some trace edema on exam, some rales.  Overall normal work of breathing.  EKG shows sinus tachycardia.  Does have some T wave inversions inferiorly that are new from prior EKG.  Troponin normal.  No chest pain.  Unlikely ACS but will trend trop. BNP elevated to 1300, chest x-ray with small right pleural effusion.  No pneumonia, no pneumothorax.  Otherwise no significant electrolyte abnormality, kidney injury, leukocytosis.  Overall clinically it appears that patient likely is volume overloaded.  Suspect that this is secondary from recent bilateral PEs and CABG.  She has not had a steady dose of Lasix rx.  Will give 40 mg IV Lasix here and admit for further care.  Would likely benefit from a repeat echocardiogram and daily lasix.  At this time low suspicion for increased burden of PE as patient is not hypoxic.  She has been compliant with her Xarelto.  Patient admitted to hospitalist for further care.  Hemodynamically stable throughout my care.  This chart was dictated using voice recognition software.  Despite best efforts to proofread,  errors can occur which can change the documentation meaning.    Final Clinical Impressions(s) / ED Diagnoses   Final diagnoses:  Acute on chronic congestive heart failure, unspecified heart failure type ([place])  SOB (shortness  of breath)    ED Discharge Orders    None       Lennice Sites, DO 11/27/18 2255

## 2018-11-27 NOTE — Patient Instructions (Signed)
Please go to [facility] Emergency room. We have made them aware you are coming.

## 2018-11-27 NOTE — Assessment & Plan Note (Signed)
Post CABG bilateral PE [facility] [place]) 11/07/2018-Xarelto

## 2018-11-27 NOTE — Assessment & Plan Note (Signed)
CABG x 2 with LIMA-LAD and SVG-OM 09/07/18

## 2018-11-27 NOTE — Progress Notes (Signed)
11/27/2018 Jennifer [name]   November 18, 1955  [id number]  Primary Physician Monico Blitz, MD Primary Cardiologist: Dr Bronson Ing  HPI:  Ms. Jennifer [name] is a pleasant 63 year old female with a history of prior nonischemic cardiomyopathy 15 years ago.  She was seen as an OP 08/24/18 with chest pain.  Exercise Myoview study done November 15th was high risk.  Echocardiogram November 21st showed severe LV dysfunction with an EF of 20-25%.  Catheterization done 09/21/18 revealed 90% proximal LAD and a 50% circumflex stenosis.  She underwent bypass grafting x2 with an L IMA to the LAD and an SVG to the OM on 09/22/18.  She tolerated this well.  In Jan 2020 she was admitted to [facility] with bilateral PE and started on Xarelto.  She tells me she felt better initially.  Dr Bronson Ing saw her in follow up 11/08/2018 and she was hypotensive and fatigued.  Her losartan was stopped.  She called the office today with c/o SOB and was added on to my schedule. She tells me she has been SOB for the past 2-3 days, mainly DOE.  She has taken Lasix 40 mg the last two days with some improvement but is still SOB with any exertion- even conversation.    Current Outpatient Medications  Medication Sig Dispense Refill  . aspirin EC 325 MG EC tablet Take 1 tablet (325 mg total) by mouth daily. 30 tablet 0  . atorvastatin (LIPITOR) 80 MG tablet Take 1 tablet (80 mg total) by mouth daily at 6 PM. 30 tablet 1  . carvedilol (COREG) 6.25 MG tablet Take 1 tablet (6.25 mg total) by mouth 2 (two) times daily with a meal. 60 tablet 1  . cholecalciferol (VITAMIN D3) 25 MCG (1000 UT) tablet Take 1,000 Units by mouth daily.    . DULoxetine (CYMBALTA) 60 MG capsule Take 60 mg by mouth 2 (two) times daily.    . furosemide (LASIX) 40 MG tablet Take 1 tablet (40 mg total) by mouth daily as needed. 90 tablet 1  . gabapentin (NEURONTIN) 300 MG capsule Take 600 mg by mouth 3 (three) times daily.    . hydrochlorothiazide (HYDRODIURIL) 25 MG tablet  Take 25 mg by mouth daily.    Marland Kitchen Phenylephrine-APAP-guaiFENesin (MUCINEX SINUS-MAX PO) Take 2 tablets by mouth 2 (two) times daily as needed (sinus headaches).    . potassium chloride SA (K-DUR,KLOR-CON) 20 MEQ tablet Take 1 tablet (20 mEq total) by mouth daily as needed. 90 tablet 1  . pramipexole (MIRAPEX) 0.5 MG tablet Take 0.5 mg by mouth at bedtime.     . Rivaroxaban (XARELTO) 15 MG TABS tablet Take 15 mg by mouth 2 (two) times daily with a meal.    . vitamin C (ASCORBIC ACID) 500 MG tablet Take 500 mg by mouth daily.     No current facility-administered medications for this visit.     Allergies  Allergen Reactions  . Poison Oak Extract Rash    Past Medical History:  Diagnosis Date  . Arthritis    "probably; maybe in my hands" (09/21/2018)  . Congestive heart failure (CHF) ([place])   . Fibromyalgia   . GERD (gastroesophageal reflux disease)   . Hypercholesteremia   . Hypertension   . IBS (irritable bowel syndrome)   . Mild sleep apnea    no CPAP (09/21/2018)  . Myocardial infarction [facility])    "was told I have had one; never knew about it" (09/21/2018)  . Nonischemic cardiomyopathy (HCC)    ECHO EF 40-45%,  Septal Thickness,and mild global left ventricular dysfunction; stress test 03/2004 +VE, refersible defect; ECHO 03/2005 @[facility]  EF 55%    Social History   Socioeconomic History  . Marital status: Widowed    Spouse name: Not on file  . Number of children: Not on file  . Years of education: Not on file  . Highest education level: Not on file  Occupational History  . Not on file  Social Needs  . Financial resource strain: Not on file  . Food insecurity:    Worry: Not on file    Inability: Not on file  . Transportation needs:    Medical: Not on file    Non-medical: Not on file  Tobacco Use  . Smoking status: Former Smoker    Packs/day: 1.00    Years: 30.00    Pack years: 30.00    Types: Cigarettes    Last attempt to quit: 1992    Years since quitting: 28.1  .  Smokeless tobacco: Never Used  Substance and Sexual Activity  . Alcohol use: Not Currently    Frequency: Never  . Drug use: Never  . Sexual activity: Not Currently  Lifestyle  . Physical activity:    Days per week: Not on file    Minutes per session: Not on file  . Stress: Not on file  Relationships  . Social connections:    Talks on phone: Not on file    Gets together: Not on file    Attends religious service: Not on file    Active member of club or organization: Not on file    Attends meetings of clubs or organizations: Not on file    Relationship status: Not on file  . Intimate partner violence:    Fear of current or ex partner: Not on file    Emotionally abused: Not on file    Physically abused: Not on file    Forced sexual activity: Not on file  Other Topics Concern  . Not on file  Social History Narrative  . Not on file     Family History  Problem Relation Age of Onset  . Diabetes Mellitus II Mother   . Heart disease Mother 78  . Colon cancer Father   . Alcohol abuse Father   . Depression Father   . Congestive Heart Failure Brother 32       probably dilated cardiomyopathy after PNA     Review of Systems: General: negative for chills, fever, night sweats or weight changes.  Cardiovascular: negative for chest pain, edema, orthopnea, palpitations, paroxysmal nocturnal dyspnea  Dermatological: negative for rash Respiratory: negative for cough or wheezing Urologic: negative for hematuria Abdominal: negative for nausea, vomiting, diarrhea, bright red blood per rectum, melena, or hematemesis Neurologic: negative for visual changes, syncope, or dizziness All other systems reviewed and are otherwise negative except as noted above.    Blood pressure 122/72, pulse (!) 112, height 5\' 2"  (1.575 m), weight 180 lb 6.4 oz (81.8 kg), SpO2 99 %.  General appearance: alert, cooperative, appears older than stated age and mild distress Neck: no carotid bruit and no  JVD Lungs: clear to auscultation bilaterally Heart: regular rate and rhythm and soft S3 Abdomen: not distended Extremities: no edema Skin: pale, warm, dry Neurologic: Grossly normal  EKG NSR, ST- 112  ASSESSMENT AND PLAN:   Exertional dyspnea Multifactorial- possibly underlying COPD contributing, she may have increased pulmonary pressure secondary to bilateral PE (though she says she initially improved),  She could be in  CHF though no edema and her lungs are fairly clear.  Will also r/o anemia as she is on Xarelto and ASA (denies melena)  Ischemic cardiomyopathy EF 20% pre CABG  Pulmonary embolism (HCC) Post CABG bilateral PE [facility] [place]) 11/07/2018-Xarelto  S/P CABG (coronary artery bypass graft) CABG x 2 with LIMA-LAD and SVG-OM 09/07/18   PLAN  Pt seen and evaluated with Dr Margaretann Loveless in the office today.  We recommend further work up at the hospital including an echo to look for RV failure after her recent PE, as well as labs including BNP, BMP, CBC to look for other causes of new dyspnea.  Further work up at the discretion of the attending ED physician.   Kerin Ransom PA-C 11/27/2018 4:10 PM

## 2018-11-27 NOTE — Telephone Encounter (Signed)
Patient calling with c/o SOB the last week.  Worsening with exertion.  Been out of surgery x 3 months now.  Was recently dx with PE and placed on Xarelto for this.  No c/o chest pain or dizziness.  Last seen 11/08/18.

## 2018-11-27 NOTE — H&P (Signed)
History and Physical    Jennifer [name] TOI:[id number] DOB: 1956-08-21 DOA: 11/27/2018  PCP: Monico Blitz, MD Patient coming from: Home  Chief Complaint: SOB, cough  HPI: Jennifer [name] is a 63 y.o. female with medical history significant of CAD status post CABG in December 2019, CHF with EF 25 to 30%, GERD, hypertension, hyperlipidemia, IBS, PE on Xarelto presenting to the hospital for evaluation of shortness of breath and cough. Patient was seen by cardiology and sent to the hospital for further evaluation.  She reports 1 week history of dyspnea with minimal exertion and a dry cough.  Denies having any orthopnea or lower extremity edema.  Denies having any chest pain or wheezing.  Reports compliance with home Xarelto.  She has been taking Lasix 40 mg daily for this past week which has somewhat helped with her dyspnea.  Review of Systems: As per HPI otherwise 10 point review of systems negative.  Past Medical History:  Diagnosis Date  . Arthritis    "probably; maybe in my hands" (09/21/2018)  . Congestive heart failure (CHF) ([place])   . Fibromyalgia   . GERD (gastroesophageal reflux disease)   . Hypercholesteremia   . Hypertension   . IBS (irritable bowel syndrome)   . Mild sleep apnea    no CPAP (09/21/2018)  . Myocardial infarction [facility])    "was told I have had one; never knew about it" (09/21/2018)  . Nonischemic cardiomyopathy (HCC)    ECHO EF 40-45%, Septal Thickness,and mild global left ventricular dysfunction; stress test 03/2004 +VE, refersible defect; ECHO 03/2005 @[facility]  EF 55%  . Pulmonary embolus [facility])     Past Surgical History:  Procedure Laterality Date  . ANKLE FRACTURE SURGERY Left 2000  . BUNIONECTOMY Right 1990s?   "& removed part of my great toe due to fungus under nail"  . CARDIAC CATHETERIZATION  03/2005  . CORONARY ARTERY BYPASS GRAFT N/A 09/22/2018   Procedure: CORONARY ARTERY BYPASS GRAFTING (CABG), ON PUMP, TIMES TWO, USING LEFT INTERNAL MAMMARY ARTERY AND  ENDOSCOPICALLY HARVESTED RIGHT GREATER SAPHENOUS VEIN;  Surgeon: Melrose Nakayama, MD;  Location: [facility];  Service: Open Heart Surgery;  Laterality: N/A;  . DILATION AND CURETTAGE OF UTERUS    . FRACTURE SURGERY    . RIGHT/LEFT HEART CATH AND CORONARY ANGIOGRAPHY N/A 09/21/2018   Procedure: RIGHT/LEFT HEART CATH AND CORONARY ANGIOGRAPHY;  Surgeon: Troy Sine, MD;  Location: [place] [facility];  Service: Cardiovascular;  Laterality: N/A;  . TEE WITHOUT CARDIOVERSION N/A 09/22/2018   Procedure: TRANSESOPHAGEAL ECHOCARDIOGRAM (TEE);  Surgeon: Melrose Nakayama, MD;  Location: [place];  Service: Open Heart Surgery;  Laterality: N/A;  . TUBAL LIGATION       reports that she quit smoking about 28 years ago. Her smoking use included cigarettes. She has a 30.00 pack-year smoking history. She has never used smokeless tobacco. She reports previous alcohol use. She reports that she does not use drugs.  Allergies  Allergen Reactions  . Poison Oak Extract Rash    Family History  Problem Relation Age of Onset  . Diabetes Mellitus II Mother   . Heart disease Mother 27  . Colon cancer Father   . Alcohol abuse Father   . Depression Father   . Congestive Heart Failure Brother 70       probably dilated cardiomyopathy after PNA    Prior to Admission medications   Medication Sig Start Date End Date Taking? Authorizing Provider  aspirin EC 325 MG EC tablet Take  1 tablet (325 mg total) by mouth daily. Patient taking differently: Take 325 mg by mouth every evening.  09/28/18  Yes Harriet Pho, Tessa N, PA-C  atorvastatin (LIPITOR) 80 MG tablet Take 1 tablet (80 mg total) by mouth daily at 6 PM. 09/28/18  Yes Harriet Pho, Tessa N, PA-C  carvedilol (COREG) 6.25 MG tablet Take 1 tablet (6.25 mg total) by mouth 2 (two) times daily with a meal. 09/28/18  Yes Harriet Pho, Tessa N, PA-C  cholecalciferol (VITAMIN D3) 25 MCG (1000 UT) tablet Take 1,000 Units by mouth daily.   Yes [provider]  DULoxetine  (CYMBALTA) 60 MG capsule Take 60 mg by mouth 2 (two) times daily.   Yes [provider]  furosemide (LASIX) 40 MG tablet Take 1 tablet (40 mg total) by mouth daily as needed. Patient taking differently: Take 40 mg by mouth daily as needed for fluid.  11/08/18 02/06/19 Yes Herminio Commons, MD  gabapentin (NEURONTIN) 300 MG capsule Take 600 mg by mouth 3 (three) times daily.   Yes [provider]  hydrochlorothiazide (HYDRODIURIL) 25 MG tablet Take 25 mg by mouth daily.   Yes [provider]  potassium chloride SA (K-DUR,KLOR-CON) 20 MEQ tablet Take 1 tablet (20 mEq total) by mouth daily as needed. Patient taking differently: Take 20 mEq by mouth daily as needed (when taking lasix).  11/08/18  Yes Herminio Commons, MD  pramipexole (MIRAPEX) 0.5 MG tablet Take 0.5 mg by mouth at bedtime.    Yes [provider]  Rivaroxaban (XARELTO) 15 MG TABS tablet Take 15 mg by mouth 2 (two) times daily with a meal.   Yes [provider]  vitamin C (ASCORBIC ACID) 500 MG tablet Take 500 mg by mouth daily.   Yes [provider]    Physical Exam: Vitals:   11/27/18 2026 11/27/18 2115 11/27/18 2200 11/27/18 2215  BP: 99/79     Pulse: (!) 102 (!) 102 (!) 111 (!) 108  Resp: 18 (!) 22 14 (!) 23  Temp: 97.7 F (36.5 C)     TempSrc: Oral     SpO2: 96% 96% 97% 95%  Weight:      Height:        Physical Exam  Constitutional: She is oriented to person, place, and time. She appears well-developed and well-nourished. No distress.  HENT:  Head: Normocephalic.  Mouth/Throat: Oropharynx is clear and moist.  Eyes: Right eye exhibits no discharge. Left eye exhibits no discharge.  Neck: No JVD present.  Cardiovascular: Normal rate, regular rhythm and intact distal pulses.  Pulmonary/Chest: Effort normal and breath sounds normal. No respiratory distress. She has no wheezes.  Minimal rales at the bases  Abdominal: Soft. Bowel sounds are normal. She exhibits no  distension. There is no abdominal tenderness. There is no guarding.  Musculoskeletal:        General: No edema.  Neurological: She is alert and oriented to person, place, and time.  Skin: Skin is warm and dry. She is not diaphoretic.  Psychiatric: She has a normal mood and affect. Her behavior is normal.     Labs on Admission: I have personally reviewed following labs and imaging studies  CBC: Recent Labs  Lab 11/27/18 2100  WBC 9.0  NEUTROABS 4.6  HGB 12.9  HCT 39.7  MCV 97.5  PLT 086   Basic Metabolic Panel: Recent Labs  Lab 11/27/18 2100 11/28/18 0138  NA 138 138  K 3.5 3.6  CL 104 106  CO2 22 21*  GLUCOSE 100* 119*  BUN 30* 30*  CREATININE 1.05* 0.95  CALCIUM 9.0 8.8*   GFR: Estimated Creatinine Clearance: 60 mL/min (by C-G formula based on SCr of 0.95 mg/dL). Liver Function Tests: Recent Labs  Lab 11/27/18 2100  AST 49*  ALT 71*  ALKPHOS 112  BILITOT 0.9  PROT 6.9  ALBUMIN 3.6   No results for input(s): LIPASE, AMYLASE in the last 168 hours. No results for input(s): AMMONIA in the last 168 hours. Coagulation Profile: No results for input(s): INR, PROTIME in the last 168 hours. Cardiac Enzymes: Recent Labs  Lab 11/28/18 0138  TROPONINI 0.07*   BNP (last 3 results) No results for input(s): PROBNP in the last 8760 hours. HbA1C: No results for input(s): HGBA1C in the last 72 hours. CBG: No results for input(s): GLUCAP in the last 168 hours. Lipid Profile: No results for input(s): CHOL, HDL, LDLCALC, TRIG, CHOLHDL, LDLDIRECT in the last 72 hours. Thyroid Function Tests: No results for input(s): TSH, T4TOTAL, FREET4, T3FREE, THYROIDAB in the last 72 hours. Anemia Panel: No results for input(s): VITAMINB12, FOLATE, FERRITIN, TIBC, IRON, RETICCTPCT in the last 72 hours. Urine analysis:    Component Value Date/Time   COLORURINE YELLOW 09/21/2018 1414   APPEARANCEUR CLEAR 09/21/2018 1414   LABSPEC 1.029 09/21/2018 1414   PHURINE 6.0 09/21/2018  1414   GLUCOSEU NEGATIVE 09/21/2018 1414   HGBUR MODERATE (A) 09/21/2018 1414   BILIRUBINUR NEGATIVE 09/21/2018 1414   KETONESUR NEGATIVE 09/21/2018 1414   PROTEINUR NEGATIVE 09/21/2018 1414   NITRITE NEGATIVE 09/21/2018 1414   LEUKOCYTESUR NEGATIVE 09/21/2018 1414    Radiological Exams on Admission: Dg Chest 2 View  Result Date: 11/27/2018 CLINICAL DATA:  Shortness of breath for 4-5 days. EXAM: CHEST - 2 VIEW COMPARISON:  Single-view of the chest and CT chest 11/05/2018. FINDINGS: The patient is status post CABG. There is cardiomegaly. Small right pleural effusion is noted. No left effusion. Lungs are clear. No pneumothorax. No acute bony abnormality. IMPRESSION: Cardiomegaly without edema. Small right pleural effusion. Electronically Signed   By: Inge Rise M.D.   On: 11/27/2018 18:20    EKG: Independently reviewed.  Sinus tachycardia (heart rate 109).  Minimal ST depression and T wave inversions in inferior leads similar to prior tracing from October 09, 2018.  Assessment/Plan Principal Problem:   Exertional dyspnea Active Problems:   HTN (hypertension)   Dyslipidemia, goal LDL below 70   Pulmonary embolism (HCC)   AKI (acute kidney injury) (HCC)   Exertional dyspnea -Troponin 0.02 >0.07.  EKG without acute ischemic changes.  Patient denies having any chest pain and appears comfortable on exam.  Continue to trend troponin. -Echo done November 2019 showing EF 20 to 25%.  Underwent CABG for ischemic cardiomyopathy in December 2019.  BNP elevated at 1311, no prior baseline.  Does not appear grossly volume overloaded on exam. Chest x-ray showing cardiomegaly without edema and small right pleural effusion.  Patient is not hypoxic and breathing comfortably on room air.  Order echocardiogram to reassess cardiac function.  Will hold off giving Lasix at this time as blood pressure is slightly soft. -History of PE currently on Xarelto and reports compliance with home medication.   Slightly tachycardic but not hypoxic.  Consider ordering CT angiogram if echo shows stable cardiac function.  Mild AKI BUN 30.  Creatinine 1.0, baseline point 0.5-0.6. -Continue to monitor renal function -Avoid nephrotoxic agents/contrast  Hyperlipidemia -Continue statin  Depression -Continue duloxetine  History of PE -Continue Xarelto  DVT prophylaxis: Xarelto  Code Status: Full code.  Discussed with the patient. Family Communication: No family available. Disposition Plan: Anticipate discharge after clinical improvement. Consults called: None Admission status: Observation, telemetry   Shela Leff MD [facility] Pager [phone]  If 7PM-7AM, please contact night-coverage [email or web address] Password [facility]  11/28/2018, 6:40 AM

## 2018-11-27 NOTE — Telephone Encounter (Signed)
Daughter Janett Billow) notified.  OV given for Kerin Ransom, PA at 3:00 today [facility] office.

## 2018-11-27 NOTE — Assessment & Plan Note (Signed)
Multifactorial- possibly underlying COPD contributing, she may have increased pulmonary pressure secondary to bilateral PE (though she says she initially improved),  She could be in CHF though no edema and her lungs are fairly clear.  Will also r/o anemia as she is on Xarelto and ASA (denies melena)

## 2018-11-27 NOTE — ED Triage Notes (Signed)
Pt reports feeling SHOB x 4-5 days, she reports it has progressively gotten worse and more frequent since, worsens on exertion. Pt reports she also has an nagging non-productive cough. Pt denies CP or swelling. Pt reports hx of CABG and PE(3 weeks ago).

## 2018-11-27 NOTE — ED Notes (Signed)
Patient ambulatory to bathroom with steady gait at this time 

## 2018-11-28 ENCOUNTER — Observation Stay (HOSPITAL_COMMUNITY): Payer: Medicare PPO

## 2018-11-28 ENCOUNTER — Encounter (HOSPITAL_COMMUNITY): Payer: Self-pay | Admitting: General Practice

## 2018-11-28 DIAGNOSIS — N179 Acute kidney failure, unspecified: Secondary | ICD-10-CM

## 2018-11-28 DIAGNOSIS — E669 Obesity, unspecified: Secondary | ICD-10-CM | POA: Diagnosis present

## 2018-11-28 DIAGNOSIS — I428 Other cardiomyopathies: Secondary | ICD-10-CM | POA: Diagnosis present

## 2018-11-28 DIAGNOSIS — G4730 Sleep apnea, unspecified: Secondary | ICD-10-CM | POA: Diagnosis present

## 2018-11-28 DIAGNOSIS — R0683 Snoring: Secondary | ICD-10-CM | POA: Diagnosis present

## 2018-11-28 DIAGNOSIS — I5043 Acute on chronic combined systolic (congestive) and diastolic (congestive) heart failure: Secondary | ICD-10-CM | POA: Diagnosis present

## 2018-11-28 DIAGNOSIS — K219 Gastro-esophageal reflux disease without esophagitis: Secondary | ICD-10-CM | POA: Diagnosis present

## 2018-11-28 DIAGNOSIS — E785 Hyperlipidemia, unspecified: Secondary | ICD-10-CM | POA: Diagnosis present

## 2018-11-28 DIAGNOSIS — G629 Polyneuropathy, unspecified: Secondary | ICD-10-CM | POA: Diagnosis present

## 2018-11-28 DIAGNOSIS — M797 Fibromyalgia: Secondary | ICD-10-CM | POA: Diagnosis present

## 2018-11-28 DIAGNOSIS — I471 Supraventricular tachycardia: Secondary | ICD-10-CM | POA: Diagnosis not present

## 2018-11-28 DIAGNOSIS — I10 Essential (primary) hypertension: Secondary | ICD-10-CM | POA: Diagnosis not present

## 2018-11-28 DIAGNOSIS — I252 Old myocardial infarction: Secondary | ICD-10-CM | POA: Diagnosis not present

## 2018-11-28 DIAGNOSIS — Q256 Stenosis of pulmonary artery: Secondary | ICD-10-CM | POA: Diagnosis not present

## 2018-11-28 DIAGNOSIS — I50810 Right heart failure, unspecified: Secondary | ICD-10-CM | POA: Diagnosis not present

## 2018-11-28 DIAGNOSIS — R0602 Shortness of breath: Secondary | ICD-10-CM | POA: Diagnosis present

## 2018-11-28 DIAGNOSIS — I340 Nonrheumatic mitral (valve) insufficiency: Secondary | ICD-10-CM

## 2018-11-28 DIAGNOSIS — D7589 Other specified diseases of blood and blood-forming organs: Secondary | ICD-10-CM | POA: Diagnosis present

## 2018-11-28 DIAGNOSIS — G4733 Obstructive sleep apnea (adult) (pediatric): Secondary | ICD-10-CM | POA: Diagnosis not present

## 2018-11-28 DIAGNOSIS — I361 Nonrheumatic tricuspid (valve) insufficiency: Secondary | ICD-10-CM

## 2018-11-28 DIAGNOSIS — E7800 Pure hypercholesterolemia, unspecified: Secondary | ICD-10-CM | POA: Diagnosis present

## 2018-11-28 DIAGNOSIS — F329 Major depressive disorder, single episode, unspecified: Secondary | ICD-10-CM | POA: Diagnosis present

## 2018-11-28 DIAGNOSIS — I429 Cardiomyopathy, unspecified: Secondary | ICD-10-CM | POA: Diagnosis not present

## 2018-11-28 DIAGNOSIS — I5023 Acute on chronic systolic (congestive) heart failure: Secondary | ICD-10-CM | POA: Diagnosis not present

## 2018-11-28 DIAGNOSIS — R627 Adult failure to thrive: Secondary | ICD-10-CM | POA: Diagnosis present

## 2018-11-28 DIAGNOSIS — I081 Rheumatic disorders of both mitral and tricuspid valves: Secondary | ICD-10-CM | POA: Diagnosis present

## 2018-11-28 DIAGNOSIS — I2510 Atherosclerotic heart disease of native coronary artery without angina pectoris: Secondary | ICD-10-CM | POA: Diagnosis present

## 2018-11-28 DIAGNOSIS — I2699 Other pulmonary embolism without acute cor pulmonale: Secondary | ICD-10-CM | POA: Diagnosis present

## 2018-11-28 DIAGNOSIS — I110 Hypertensive heart disease with heart failure: Secondary | ICD-10-CM | POA: Diagnosis present

## 2018-11-28 DIAGNOSIS — I5082 Biventricular heart failure: Secondary | ICD-10-CM | POA: Diagnosis present

## 2018-11-28 DIAGNOSIS — E876 Hypokalemia: Secondary | ICD-10-CM | POA: Diagnosis present

## 2018-11-28 DIAGNOSIS — I509 Heart failure, unspecified: Secondary | ICD-10-CM | POA: Diagnosis not present

## 2018-11-28 DIAGNOSIS — R0609 Other forms of dyspnea: Secondary | ICD-10-CM | POA: Diagnosis not present

## 2018-11-28 DIAGNOSIS — K589 Irritable bowel syndrome without diarrhea: Secondary | ICD-10-CM | POA: Diagnosis present

## 2018-11-28 LAB — BASIC METABOLIC PANEL
ANION GAP: 11 (ref 5–15)
BUN: 30 mg/dL — ABNORMAL HIGH (ref 8–23)
CHLORIDE: 106 mmol/L (ref 98–111)
CO2: 21 mmol/L — ABNORMAL LOW (ref 22–32)
Calcium: 8.8 mg/dL — ABNORMAL LOW (ref 8.9–10.3)
Creatinine, Ser: 0.95 mg/dL (ref 0.44–1.00)
GFR calc Af Amer: >60 mL/min (ref >60)
GFR calc non Af Amer: >60 mL/min (ref >60)
Glucose, Bld: 119 mg/dL — ABNORMAL HIGH (ref 70–99)
POTASSIUM: 3.6 mmol/L (ref 3.5–5.1)
Sodium: 138 mmol/L (ref 135–145)

## 2018-11-28 LAB — ECHOCARDIOGRAM COMPLETE
Height: 62 in
Weight: 2873.6 oz

## 2018-11-28 LAB — HIV ANTIBODY (ROUTINE TESTING W REFLEX): HIV Screen 4th Generation wRfx: NONREACTIVE

## 2018-11-28 LAB — TROPONIN I
TROPONIN I: 0.06 ng/mL — AB (ref <0.03)
Troponin I: 0.07 ng/mL (ref <0.03)

## 2018-11-28 MED ORDER — ACETAMINOPHEN 325 MG PO TABS: 650.0000 mg | ORAL_TABLET | Freq: Four times a day (QID) | ORAL | Status: DC | PRN

## 2018-11-28 MED ORDER — VITAMIN D 25 MCG (1000 UNIT) PO TABS
1000.0000 [IU] | ORAL_TABLET | Freq: Every day | ORAL | Status: DC
  Administered 2018-11-28 – 2018-12-07 (×10): 1000 [IU] via ORAL
  Filled 2018-11-28 (×10): qty 1

## 2018-11-28 MED ORDER — ASPIRIN EC 325 MG PO TBEC
325.0000 mg | DELAYED_RELEASE_TABLET | Freq: Every evening | ORAL | Status: DC
  Administered 2018-11-28 – 2018-11-29 (×2): 325 mg via ORAL
  Filled 2018-11-28 (×3): qty 1

## 2018-11-28 MED ORDER — ACETAMINOPHEN 650 MG RE SUPP: 650.0000 mg | Freq: Four times a day (QID) | RECTAL | Status: DC | PRN

## 2018-11-28 MED ORDER — ATORVASTATIN CALCIUM 80 MG PO TABS
80.0000 mg | ORAL_TABLET | Freq: Every day | ORAL | Status: DC
  Administered 2018-11-28 – 2018-12-06 (×9): 80 mg via ORAL
  Filled 2018-11-28 (×9): qty 1

## 2018-11-28 MED ORDER — GABAPENTIN 300 MG PO CAPS
600.0000 mg | ORAL_CAPSULE | Freq: Three times a day (TID) | ORAL | Status: DC
  Administered 2018-11-28 – 2018-12-07 (×28): 600 mg via ORAL
  Filled 2018-11-28 (×29): qty 2

## 2018-11-28 MED ORDER — PRAMIPEXOLE DIHYDROCHLORIDE 0.25 MG PO TABS
0.5000 mg | ORAL_TABLET | Freq: Every day | ORAL | Status: DC
  Administered 2018-11-28 – 2018-12-06 (×10): 0.5 mg via ORAL
  Filled 2018-11-28 (×11): qty 2

## 2018-11-28 MED ORDER — RIVAROXABAN 20 MG PO TABS
20.0000 mg | ORAL_TABLET | Freq: Every day | ORAL | Status: DC
  Administered 2018-11-28 – 2018-11-30 (×3): 20 mg via ORAL
  Filled 2018-11-28 (×3): qty 1

## 2018-11-28 MED ORDER — POTASSIUM CHLORIDE CRYS ER 20 MEQ PO TBCR
40.0000 meq | EXTENDED_RELEASE_TABLET | Freq: Every day | ORAL | Status: DC
  Administered 2018-11-28 – 2018-12-01 (×4): 40 meq via ORAL
  Filled 2018-11-28 (×4): qty 2

## 2018-11-28 MED ORDER — FUROSEMIDE 10 MG/ML IJ SOLN
40.0000 mg | Freq: Two times a day (BID) | INTRAMUSCULAR | Status: DC
  Administered 2018-11-28: 40 mg via INTRAVENOUS
  Filled 2018-11-28: qty 4

## 2018-11-28 MED ORDER — DULOXETINE HCL 60 MG PO CPEP
60.0000 mg | ORAL_CAPSULE | Freq: Two times a day (BID) | ORAL | Status: DC
  Administered 2018-11-28 – 2018-12-07 (×20): 60 mg via ORAL
  Filled 2018-11-28 (×20): qty 1

## 2018-11-28 MED ORDER — RIVAROXABAN 15 MG PO TABS: 15.0000 mg | ORAL_TABLET | Freq: Two times a day (BID) | ORAL | Status: DC

## 2018-11-28 MED ORDER — FUROSEMIDE 10 MG/ML IJ SOLN
40.0000 mg | Freq: Two times a day (BID) | INTRAMUSCULAR | Status: AC
  Administered 2018-11-28: 40 mg via INTRAVENOUS
  Filled 2018-11-28: qty 4

## 2018-11-28 MED ORDER — VITAMIN C 500 MG PO TABS
500.0000 mg | ORAL_TABLET | Freq: Every day | ORAL | Status: DC
  Administered 2018-11-28 – 2018-12-07 (×10): 500 mg via ORAL
  Filled 2018-11-28 (×10): qty 1

## 2018-11-28 MED ORDER — CARVEDILOL 6.25 MG PO TABS
6.2500 mg | ORAL_TABLET | Freq: Two times a day (BID) | ORAL | Status: DC
  Administered 2018-11-28 – 2018-11-29 (×2): 6.25 mg via ORAL
  Filled 2018-11-28: qty 1
  Filled 2018-11-28: qty 0.5
  Filled 2018-11-28 (×2): qty 1
  Filled 2018-11-28: qty 0.5

## 2018-11-28 NOTE — Progress Notes (Signed)
Pt received to 3e07 from ED, pt alert and oriented, daughter and son in law at bedside, reviewed orders, pt verbalized frustration with getting sob with exertion "If I go slow then I do ok but if I try to walk normal I get sob" pt s/p cabg in dec 2019 and bil PE 3 weeks ago on xarelto, reports taking meds, echo sonographer into room to do bedside echo

## 2018-11-28 NOTE — ED Notes (Signed)
Pt oob to bathroom with steady ngai8t. Pt ate 50 % of lunch tray.

## 2018-11-28 NOTE — Progress Notes (Signed)
*  PRELIMINARY RESULTS* Echocardiogram 2D Echocardiogram has been performed.  Samuel Germany 11/28/2018, 3:14 PM

## 2018-11-28 NOTE — ED Notes (Signed)
Breakfast Tray Ordered. 

## 2018-11-28 NOTE — Progress Notes (Addendum)
PROGRESS NOTE    Jennifer [name]  ZTI:[id number] DOB: 11-10-1955 DOA: 11/27/2018 PCP: Monico Blitz, MD  Brief Narrative: 63 year old female with history of CAD, status post CABG in December 2, 20 1 9, ischemic cardiomyopathy, EF of 25%, hypertension, dyslipidemia, IBS was just diagnosed with pulmonary embolism 2 to 3 weeks ago, presented to the ED from cardiology clinic yesterday with worsening dyspnea for 1 week. -No significant edema, dry cough, no fevers or chills, no chest pain, no wheezing, admits compliance with Xarelto, usually is on Lasix as needed at baseline last week has has taken few doses of this which helped to a small extent   Assessment & Plan:   Dyspnea -Suspect this is multifactorial, mild degree of volume overload, acute on chronic systolic CHF -I also suspect her recent PE, likely causing some degree of pulmonary hypertension, RV dysfunction and contributing to dyspnea -Weight up by 4 pounds, since last clinic visit 2 weeks ago, has trace edema on exam -Will diurese with 2 doses of IV Lasix today -Follow-up 2D echocardiogram to evaluate PA pressures, RV and EF  Acute on chronic systolic CHF -Last echo with EF of 25% -IV Lasix as above, continue Coreg  CAD/CABG -Stable, continue aspirin, carvedilol, statin  Recent pulmonary embolism -Continue Xarelto, follow-up echo  Depression -Continue duloxetine  Dyslipidemia -Continue statin  DVT prophylaxis: Xarelto Code Status: Full code  family Communication: No family at bedside Disposition Plan: Home tomorrow if stable/improved  Consultants:      Procedures:   Antimicrobials:    Subjective: -Feels okay now, reports exertional dyspnea for 1 week  Objective: Vitals:   11/27/18 2215 11/28/18 0736 11/28/18 0800 11/28/18 1135  BP:  105/80 106/86 (!) 122/103  Pulse: (!) 108 (!) 104 (!) 108 (!) 115  Resp: (!) 23 20 20 19   Temp:      TempSrc:      SpO2: 95% 96%  96%  Weight:      Height:       No  intake or output data in the 24 hours ending 11/28/18 1153 Filed Weights   11/27/18 1648  Weight: 81.6 kg    Examination:  General exam: Appears calm and comfortable  Respiratory system: Rare basilar crackles Cardiovascular system: S1 & S2 heard, RRR  Gastrointestinal system: Abdomen is nondistended, soft and nontender.Normal bowel sounds heard. Central nervous system: Alert and oriented. No focal neurological deficits. Extremities: Trace edema Skin: No rashes, lesions or ulcers Psychiatry: Judgement and insight appear normal. Mood & affect appropriate.     Data Reviewed:   CBC: Recent Labs  Lab 11/27/18 2100  WBC 9.0  NEUTROABS 4.6  HGB 12.9  HCT 39.7  MCV 97.5  PLT 825   Basic Metabolic Panel: Recent Labs  Lab 11/27/18 2100 11/28/18 0138  NA 138 138  K 3.5 3.6  CL 104 106  CO2 22 21*  GLUCOSE 100* 119*  BUN 30* 30*  CREATININE 1.05* 0.95  CALCIUM 9.0 8.8*   GFR: Estimated Creatinine Clearance: 60 mL/min (by C-G formula based on SCr of 0.95 mg/dL). Liver Function Tests: Recent Labs  Lab 11/27/18 2100  AST 49*  ALT 71*  ALKPHOS 112  BILITOT 0.9  PROT 6.9  ALBUMIN 3.6   No results for input(s): LIPASE, AMYLASE in the last 168 hours. No results for input(s): AMMONIA in the last 168 hours. Coagulation Profile: No results for input(s): INR, PROTIME in the last 168 hours. Cardiac Enzymes: Recent Labs  Lab 11/28/18 0138 11/28/18 0715  TROPONINI  0.07* 0.06*   BNP (last 3 results) No results for input(s): PROBNP in the last 8760 hours. HbA1C: No results for input(s): HGBA1C in the last 72 hours. CBG: No results for input(s): GLUCAP in the last 168 hours. Lipid Profile: No results for input(s): CHOL, HDL, LDLCALC, TRIG, CHOLHDL, LDLDIRECT in the last 72 hours. Thyroid Function Tests: No results for input(s): TSH, T4TOTAL, FREET4, T3FREE, THYROIDAB in the last 72 hours. Anemia Panel: No results for input(s): VITAMINB12, FOLATE, FERRITIN, TIBC,  IRON, RETICCTPCT in the last 72 hours. Urine analysis:    Component Value Date/Time   COLORURINE YELLOW 09/21/2018 1414   APPEARANCEUR CLEAR 09/21/2018 1414   LABSPEC 1.029 09/21/2018 1414   PHURINE 6.0 09/21/2018 1414   GLUCOSEU NEGATIVE 09/21/2018 1414   HGBUR MODERATE (A) 09/21/2018 1414   BILIRUBINUR NEGATIVE 09/21/2018 1414   KETONESUR NEGATIVE 09/21/2018 1414   PROTEINUR NEGATIVE 09/21/2018 1414   NITRITE NEGATIVE 09/21/2018 1414   LEUKOCYTESUR NEGATIVE 09/21/2018 1414   Sepsis Labs: @LABRCNTIP (procalcitonin:4,lacticidven:4)  )No results found for this or any previous visit (from the past 240 hour(s)).       Radiology Studies: Dg Chest 2 View  Result Date: 11/27/2018 CLINICAL DATA:  Shortness of breath for 4-5 days. EXAM: CHEST - 2 VIEW COMPARISON:  Single-view of the chest and CT chest 11/05/2018. FINDINGS: The patient is status post CABG. There is cardiomegaly. Small right pleural effusion is noted. No left effusion. Lungs are clear. No pneumothorax. No acute bony abnormality. IMPRESSION: Cardiomegaly without edema. Small right pleural effusion. Electronically Signed   By: Inge Rise M.D.   On: 11/27/2018 18:20        Scheduled Meds: . aspirin  325 mg Oral QPM  . atorvastatin  80 mg Oral q1800  . carvedilol  6.25 mg Oral BID WC  . cholecalciferol  1,000 Units Oral Daily  . DULoxetine  60 mg Oral BID  . furosemide  40 mg Intravenous Q12H  . gabapentin  600 mg Oral TID  . potassium chloride  40 mEq Oral Daily  . pramipexole  0.5 mg Oral QHS  . rivaroxaban  20 mg Oral Daily  . vitamin C  500 mg Oral Daily   Continuous Infusions:   LOS: 0 days    Time spent: 55min    Domenic Polite, MD [facility]   11/28/2018, 11:53 AM

## 2018-11-28 NOTE — ED Notes (Signed)
Pt oob to bathroom with steady gait. 

## 2018-11-28 NOTE — Progress Notes (Signed)
ANTICOAGULATION CONSULT NOTE - Initial Consult  Pharmacy Consult for Xarelto Indication: pulmonary embolus  Allergies  Allergen Reactions  . Poison Oak Extract Rash    Patient Measurements: Height: 5\' 2"  (157.5 cm) Weight: 180 lb (81.6 kg) IBW/kg (Calculated) : 50.1  Vital Signs: Temp: 97.7 F (36.5 C) (02/10 2026) Temp Source: Oral (02/10 2026) BP: 99/79 (02/10 2026) Pulse Rate: 108 (02/10 2215)  Labs: Recent Labs    11/27/18 2100  HGB 12.9  HCT 39.7  PLT 233  CREATININE 1.05*    Estimated Creatinine Clearance: 54.3 mL/min (A) (by C-G formula based on SCr of 1.05 mg/dL (H)).   Medical History: Past Medical History:  Diagnosis Date  . Arthritis    "probably; maybe in my hands" (09/21/2018)  . Congestive heart failure (CHF) ([place])   . Fibromyalgia   . GERD (gastroesophageal reflux disease)   . Hypercholesteremia   . Hypertension   . IBS (irritable bowel syndrome)   . Mild sleep apnea    no CPAP (09/21/2018)  . Myocardial infarction [facility])    "was told I have had one; never knew about it" (09/21/2018)  . Nonischemic cardiomyopathy (HCC)    ECHO EF 40-45%, Septal Thickness,and mild global left ventricular dysfunction; stress test 03/2004 +VE, refersible defect; ECHO 03/2005 @[facility]  EF 55%  . Pulmonary embolus [facility])     Assessment: 63yo female c/o SOB, noted w/ recent CABG and PE, admitted for fluid overload, to continue Xarelto; pt just completed Xarelto 15mg  BID 21-day course yesterday, started 1/21.   Plan:  Will start Xarelto 20mg  daily and ensure pt has been educated on anticoagulation.  Wynona Neat, PharmD, BCPS  11/28/2018,1:26 AM

## 2018-11-28 NOTE — Plan of Care (Signed)
Pt s/p echocardiogram and awaiting results, lungs clear, ra 100% but reports sob with she does normal activity

## 2018-11-28 NOTE — ED Notes (Signed)
Pt sitting at bedside and eating breakfast. Tolerating well

## 2018-11-28 NOTE — ED Notes (Signed)
Dr. Marlowe Sax informed of the elevated trop

## 2018-11-29 DIAGNOSIS — R0609 Other forms of dyspnea: Secondary | ICD-10-CM

## 2018-11-29 LAB — BASIC METABOLIC PANEL
Anion gap: 10 (ref 5–15)
BUN: 27 mg/dL — ABNORMAL HIGH (ref 8–23)
CO2: 27 mmol/L (ref 22–32)
Calcium: 8.9 mg/dL (ref 8.9–10.3)
Chloride: 104 mmol/L (ref 98–111)
Creatinine, Ser: 1.05 mg/dL — ABNORMAL HIGH (ref 0.44–1.00)
GFR calc Af Amer: >60 mL/min (ref >60)
GFR calc non Af Amer: 56 mL/min — ABNORMAL LOW (ref >60)
Glucose, Bld: 105 mg/dL — ABNORMAL HIGH (ref 70–99)
Potassium: 3.6 mmol/L (ref 3.5–5.1)
Sodium: 141 mmol/L (ref 135–145)

## 2018-11-29 MED ORDER — FUROSEMIDE 10 MG/ML IJ SOLN
40.0000 mg | Freq: Two times a day (BID) | INTRAMUSCULAR | Status: DC
  Filled 2018-11-29: qty 4

## 2018-11-29 MED ORDER — FUROSEMIDE 10 MG/ML IJ SOLN
20.0000 mg | Freq: Once | INTRAMUSCULAR | Status: AC
  Administered 2018-11-29: 20 mg via INTRAVENOUS

## 2018-11-29 MED ORDER — METOPROLOL SUCCINATE ER 25 MG PO TB24: 12.5000 mg | ORAL_TABLET | Freq: Every day | ORAL | Status: DC

## 2018-11-29 MED ORDER — FUROSEMIDE 10 MG/ML IJ SOLN
40.0000 mg | Freq: Every day | INTRAMUSCULAR | Status: DC
  Administered 2018-11-29 – 2018-11-30 (×2): 40 mg via INTRAVENOUS
  Filled 2018-11-29 (×2): qty 4

## 2018-11-29 NOTE — Progress Notes (Signed)
Pt bp soft, lasix iv ordered, paged Dr Broadus John to advise

## 2018-11-29 NOTE — Consult Note (Addendum)
 Cardiology Consult    Patient ID: Jennifer [name] MRN: [id number], DOB/AGE: 11/02/1955   Admit date: 11/27/2018 Date of Consult: 11/29/2018  Primary Physician: Shah, Ashish, MD Primary Cardiologist: Suresh Koneswaran, MD Requesting Provider: Preetha Joseph, MD  Patient Profile    Jennifer [name] is a 63 y.o. female with a history of CAD s/p CABG x2 in 09/2018, cardiomyopathy (previously felt to be non-ischemic 15 years ago), recent diagnosis of bilateral pulmonary embolism on Xarelto, hypertension, hyperlipidemia, and fibromyalgia who is being seen today for the evaluation of CHF at the request of Dr. Joseph.  History of Present Illness    Ms. Villard is a 63 year old female with the above history who is followed by Dr. Koneswaran. Patient presented for outpatient visit on 08/24/2018 for evaluation of chest pain. Myoview at that time was high risk with a large defect consistent with scar but no significant ischemic territories. Echocardiogram at that time showed LVEF of 20-25% with akinesis of the basal-mid anteroseptal myocardium. A right/left heart catheterization was performed on 09/21/2018 and showed severe left main equivalent disease with 90% ostial LAD stenosis and 50% ostial circumflex stenosis. Patient ended up undergoing CABG x2 (LIMA to LAD and SVG to OM1) on 09/22/2018. In 10/2018, she was admitted to [facility] with bilateral pulmonary embolism and was started on Xarelto. Patient was seen by Dr. Koneswaran for follow-up on 11/08/2018 and as hypotensive and fatigued so patient's Losartan was stopped. She was seen in our office again on 09/27/2019 for evaluation of shortness of breath. At that time, patient reported shortness of breath with minimal exertion even conversation. Decision was made to admit patient for further evaluation of her dyspnea.  p to catch her breath when ambulating to the bathroom earlier today.  Patient reports dyspnea with very minimal exertion for 3 to 4 days prior  to admission but denies any chest pain, orthopnea, PND, or edema. She also notes some palpitations with associated sweating but denies any lightheadedness or dizziness. After most recent dose of Lasix, patient states she had an unusual reaction where with palpitations, sweating, and nausea but no vomiting. At the time of this evaluation patient states she is comfortable but reports she is still having significant shortness of breath with minimal exertion to the point where she had to stop.  In the ED: BNP elevated at 1,311.1. Troponin minimally elevated and flat at 0.07 >> 0.06. Chest x-ray showed cardiomegaly without edema but a small right pleural effusion was noted. WBC 7.2, Hgb 8.1, Plts 191. Na 138, K 4.0, Mg 2.0, Glucose 101, Scr 0.72.   Past Medical History   Past Medical History:  Diagnosis Date  . Arthritis    "probably; maybe in my hands" (09/21/2018)  . Congestive heart failure (CHF) (HCC)   . Dyspnea   . Fibromyalgia   . GERD (gastroesophageal reflux disease)   . Hypercholesteremia   . Hypertension   . IBS (irritable bowel syndrome)   . Mild sleep apnea    no CPAP (09/21/2018)  . Myocardial infarction (HCC)    "was told I have had one; never knew about it" (09/21/2018)  . Nonischemic cardiomyopathy (HCC)    ECHO EF 40-45%, Septal Thickness,and mild global left ventricular dysfunction; stress test 03/2004 +VE, refersible defect; ECHO 03/2005 @[facility] EF 55%  . Pulmonary embolus (HCC)     Past Surgical History:  Procedure Laterality Date  . ANKLE FRACTURE SURGERY Left 2000  . BUNIONECTOMY Right 1990s?   "& removed part of   my great toe due to fungus under nail"  . CARDIAC CATHETERIZATION  03/2005  . CORONARY ARTERY BYPASS GRAFT N/A 09/22/2018   Procedure: CORONARY ARTERY BYPASS GRAFTING (CABG), ON PUMP, TIMES TWO, USING LEFT INTERNAL MAMMARY ARTERY AND ENDOSCOPICALLY HARVESTED RIGHT GREATER SAPHENOUS VEIN;  Surgeon: Melrose Nakayama, MD;  Location: [place];  Service: Open Heart  Surgery;  Laterality: N/A;  . DILATION AND CURETTAGE OF UTERUS    . FRACTURE SURGERY    . RIGHT/LEFT HEART CATH AND CORONARY ANGIOGRAPHY N/A 09/21/2018   Procedure: RIGHT/LEFT HEART CATH AND CORONARY ANGIOGRAPHY;  Surgeon: Troy Sine, MD;  Location: [place] [facility];  Service: Cardiovascular;  Laterality: N/A;  . TEE WITHOUT CARDIOVERSION N/A 09/22/2018   Procedure: TRANSESOPHAGEAL ECHOCARDIOGRAM (TEE);  Surgeon: Melrose Nakayama, MD;  Location: [place];  Service: Open Heart Surgery;  Laterality: N/A;  . TUBAL LIGATION       Allergies  Allergies  Allergen Reactions  . Poison Oak Extract Rash    Inpatient Medications    . aspirin  325 mg Oral QPM  . atorvastatin  80 mg Oral q1800  . carvedilol  6.25 mg Oral BID WC  . cholecalciferol  1,000 Units Oral Daily  . DULoxetine  60 mg Oral BID  . furosemide  40 mg Intravenous Daily  . gabapentin  600 mg Oral TID  . potassium chloride  40 mEq Oral Daily  . pramipexole  0.5 mg Oral QHS  . rivaroxaban  20 mg Oral Daily  . vitamin C  500 mg Oral Daily    Family History    Family History  Problem Relation Age of Onset  . Diabetes Mellitus II Mother   . Heart disease Mother 88  . Colon cancer Father   . Alcohol abuse Father   . Depression Father   . Congestive Heart Failure Brother 65       probably dilated cardiomyopathy after PNA   She indicated that her mother is deceased. She indicated that her father is deceased. She indicated that her brother is deceased.   Social History    Social History   Socioeconomic History  . Marital status: Widowed    Spouse name: Not on file  . Number of children: Not on file  . Years of education: Not on file  . Highest education level: Not on file  Occupational History  . Not on file  Social Needs  . Financial resource strain: Not on file  . Food insecurity:    Worry: Not on file    Inability: Not on file  . Transportation needs:    Medical: Not on file    Non-medical: Not  on file  Tobacco Use  . Smoking status: Former Smoker    Packs/day: 1.00    Years: 30.00    Pack years: 30.00    Types: Cigarettes    Last attempt to quit: 1992    Years since quitting: 28.1  . Smokeless tobacco: Never Used  Substance and Sexual Activity  . Alcohol use: Not Currently    Frequency: Never  . Drug use: Never  . Sexual activity: Not Currently  Lifestyle  . Physical activity:    Days per week: Not on file    Minutes per session: Not on file  . Stress: Not on file  Relationships  . Social connections:    Talks on phone: Not on file    Gets together: Not on file    Attends religious service: Not on  file    Active member of club or organization: Not on file    Attends meetings of clubs or organizations: Not on file    Relationship status: Not on file  . Intimate partner violence:    Fear of current or ex partner: Not on file    Emotionally abused: Not on file    Physically abused: Not on file    Forced sexual activity: Not on file  Other Topics Concern  . Not on file  Social History Narrative  . Not on file     Review of Systems    Review of Systems  Constitutional: Positive for malaise/fatigue. Negative for chills and fever.  HENT: Negative for congestion and sore throat.   Respiratory: Positive for shortness of breath. Negative for cough.   Cardiovascular: Positive for palpitations. Negative for chest pain, orthopnea, leg swelling and PND.  Gastrointestinal: Positive for nausea. Negative for blood in stool and vomiting.  Genitourinary: Negative for hematuria.  Musculoskeletal: Negative for falls and myalgias.  Neurological: Negative for dizziness and loss of consciousness.  Endo/Heme/Allergies: Does not bruise/bleed easily.  Psychiatric/Behavioral: Negative for substance abuse.    Physical Exam    Blood pressure 99/83, pulse (!) 109, temperature 98.3 F (36.8 C), temperature source Oral, resp. rate 19, height 5' 2" (1.575 m), weight 80.1 kg, SpO2 98  %.  General: 63 y.o. obese Caucasian female resting comfortably in no acute distress. Pleasant and cooperative. HEENT: Normal  Neck: Supple. No carotid bruits or JVD appreciated. Lungs: Mildly labored breathing while talking. Mild crackles noted in bilateral bases but lungs otherwise clear.  No wheezes or rhonchi noted. Heart: Tachycardic with regular rhythm. Distinct S1 and S2. No significant murmurs, gallops, or rubs.  Abdomen: Soft, non-distended, and non-tender to palpation. Bowel sounds present. Extremities: No lower extremity edema. Radial pulses 2+ and equal bilaterally. Skin: Warm and dry. Neuro: Alert and oriented x3. No focal deficits. Moves all extremities spontaneously. Psych: Normal affect. Responds appropriately.  Labs    Troponin [facility] Test) Recent Labs    11/27/18 2109  TROPIPOC 0.02   Recent Labs    11/28/18 0138 11/28/18 0715  TROPONINI 0.07* 0.06*   Lab Results  Component Value Date   WBC 9.0 11/27/2018   HGB 12.9 11/27/2018   HCT 39.7 11/27/2018   MCV 97.5 11/27/2018   PLT 233 11/27/2018    Recent Labs  Lab 11/27/18 2100  11/29/18 0423  NA 138   < > 141  K 3.5   < > 3.6  CL 104   < > 104  CO2 22   < > 27  BUN 30*   < > 27*  CREATININE 1.05*   < > 1.05*  CALCIUM 9.0   < > 8.9  PROT 6.9  --   --   BILITOT 0.9  --   --   ALKPHOS 112  --   --   ALT 71*  --   --   AST 49*  --   --   GLUCOSE 100*   < > 105*   < > = values in this interval not displayed.   No results found for: CHOL, HDL, LDLCALC, TRIG No results found for: [facility]   Radiology Studies    Dg Chest 2 View  Result Date: 11/27/2018 CLINICAL DATA:  Shortness of breath for 4-5 days. EXAM: CHEST - 2 VIEW COMPARISON:  Single-view of the chest and CT chest 11/05/2018. FINDINGS: The patient is status post CABG.  There is cardiomegaly. Small right pleural effusion is noted. No left effusion. Lungs are clear. No pneumothorax. No acute bony abnormality. IMPRESSION: Cardiomegaly  without edema. Small right pleural effusion. Electronically Signed   By: Thomas  Dalessio M.D.   On: 11/27/2018 18:20    EKG     EKG: EKG was personally reviewed and demonstrates: Sinus tachycardia, rate 109 bpm, with no acute ischemic changes compared to prior tracings.   Telemetry: Telemetry was personally reviewed and demonstrates: Sinus tachycardia with heart rates in the 100's to 120's. One brief run of non-sustained VT up to 7 beats also noted.  Cardiac Imaging    Echocardiogram 11/28/2018: Impressions:  1. The left ventricle has a visually estimated ejection fraction of of 10-15%. The cavity size was mild to moderately increased. Left ventricular diastolic function could not be evaluated secondary to tachycardia with E/A fusion. Left ventrical global hypokinesis without regional wall motion abnormalities.  2. The right ventricle has moderately reduced systolic function. The cavity was normal. There is no increase in right ventricular wall thickness. Right ventricular systolic pressure is mildly elevated with an estimated pressure of 39.3 mmHg.  3. Left atrial size was mildly dilated.  4. Right atrial size was mildly dilated.  5. The mitral valve is normal in structure. Mitral valve regurgitation is severe by color flow Doppler. The appearance is of secondary/functional MR.  6. The tricuspid valve is normal in structure. Tricuspid valve regurgitation is moderate.  7. The aortic valve is normal in structure.  8. Mild pulmonic stenosis.  9. The pulmonic valve was grossly normal. Pulmonic valve regurgitation is mild by color flow Doppler. 10. The aortic root is normal in size and structure. 11. The inferior vena cava was dilated in size with <50% respiratory variability. 12. No intracardiac thrombi or masses were visualized. 13. When compared to the prior study: Severe functional mitral insufficiency is new since 09/07/2018.  Assessment & Plan    Acute Systolic CHF with RV  Dysfunction - Patient presents with dyspnea with minimal exertion but no orthopnea, PND, or edema. - Chest x-ray showed cardiomegaly but no edema. - BNP elevated at 1,311.1.  - Echo showed LVEF of 10-15% (down from 20-25% in 08/2018) with global hypokinesis. RV systolic function also moderately reduced with mildly elevated systolic pressure of 39.3 mmHg. Severe mitral regurgitation also noted. - Diuresing well on IV Lasix. Documented output of 1.8 L in the past 24 hours. - Patient has mild crackles in bilateral bases but does not appear significantly volume overloaded on exam. Question whether PE and severe mitral regurgitation is contributing to significant dyspnea.  - IV Lasix has been limited some by hypotension. Continue IV Lasix 40mg daily as BP allows. - Will discontinue Coreg and start Toprol 12.5mg to allow more BP room. - Discussed with MD - Heart failure team to see in the morning.  CAD s/p CABG in 09/2018 - Patient s/p CABG x2 (LIMA to LAD and SVG to OM1) in 09/2018. - Continue aspirin, beta-blocker, and statin.   Severe Mitral Regurgitation - Echo showed severe mitral regurgitation. Was considered mild in 08/2018. - Likely secondary/functional MR.  Elevated Troponin - Troponin minimally elevated and flat at 0.07 >> 0.06. Patient denies any chest pain. Not consistent with ACS. Suspect demand ischemia.   Bilateral Pulmonary Embolism - Patient diagnosed with bilateral pulmonary embolism at [facility] in 10/2018. - Continue Xarelto.  Hypertension - BP currently soft but stable at 99/83.  - Discontinue Coreg and start Toprol as above.    Signed, Darreld Mclean, PA-C 11/29/2018, 3:07 PM  For questions or updates, please contact   Please consult [email or web address] for contact info under Cardiology/STEMI. ---------------------------------------------------------------------------------------------   History and all data above reviewed.  Patient examined.  I agree with the  findings as above.  KAREEM AUL is a pleasant 63 yo female who I initially met in clinic and for whom we arranged hospitalization for worsening exertional symptoms over the past several weeks. She is now found to have a severely reduced ejection fraction even beyond her low baseline and moderate RV dysfunction. She has resting shortness of breath and concerning class 3-4 heart failure symptoms.   Constitutional: Visibly short of breath but no increased work of breathing.  Cardiovascular: regular rhythm, normal rate, no murmurs. S1 and S2 normal. Radial pulses normal bilaterally. No jugular venous distention.  Respiratory: crackles diffusely, most prominent in the bases GI : normal bowel sounds, soft and nontender. No distention.   MSK: extremities warm, well perfused. No edema.  NEURO: grossly nonfocal exam, moves all extremities. PSYCH: alert and oriented x 3, normal mood and affect.   All available labs, radiology testing, previous records reviewed. Agree with documented assessment and plan of my colleague as stated above with the following additions or changes:  Principal Problem:   Exertional dyspnea Active Problems:   HTN (hypertension)   Dyslipidemia, goal LDL below 70   Pulmonary embolism (HCC)   AKI (acute kidney injury) ([place])   Shortness of breath    Plan: we will give an additional dose of lasix since she is acutely short of breath. She is warm on my exam, without obvious signs volume overload. I believe she may have increased symptoms from PE and RV dysfunction, however now also has severe secondary MR due to LV dysfunction. She is appropriately making urine. With worsening of already severely decreased LV function, review by our colleagues on the heart failure team will be most helpful at this point. The patient asked excellent insightful questions that I have answered to the best of my ability.   Length of Stay:  LOS: 1 day   Elouise Munroe, MD [facility] 5:32 PM   11/29/2018

## 2018-11-29 NOTE — Progress Notes (Addendum)
PROGRESS NOTE    Jennifer [name]  JME:[id number] DOB: 20-Jun-1956 DOA: 11/27/2018 PCP: Monico Blitz, MD  Brief Narrative: 63 year old female with history of CAD, status post CABG in December 2019, ischemic cardiomyopathy, EF of 25%, hypertension, dyslipidemia, IBS was just diagnosed with pulmonary embolism 2 to 3 weeks ago at [facility] 1/20, started [place], presented to the ED from cardiology clinic yesterday with worsening dyspnea for 1 week. -No significant edema, dry cough, no fevers or chills, no chest pain, no wheezing, admits compliance with Xarelto, usually is on Lasix as needed at baseline last week has has taken few doses of this which helped to a small extent   Assessment & Plan:   Acute on chronic systolic CHF/severe MR/ RV systolic dysfunction -Suspect dyspnea is multifactorial, mild degree of volume overload, acute on chronic systolic CHF-EF worsening to 10 to 15% now, and severe MR, recent pulmonary embolism -Has moderate RV systolic dysfunction, most likely secondary to recent pulmonary embolism, this is also likely contributing to MR, however given severity of MR and worsening cardiomyopathy, will ask cardiology to consult -Diuresed with IV Lasix x2 doses yesterday with good symptomatic benefit -Continue IV Lasix today, transition to oral diuretics tomorrow -Monitor weight closely, her admission weight was up 4 pounds since last clinic visit 2 weeks prior and had trace edema on exam yesterday which is improved  Acute on chronic systolic CHF -Last echo with EF of 25%, echo yesterday notes were further worsening of EF to 10 to 15%, continue Coreg -IV Lasix as above -Losartan on hold, restart at low dose today if blood pressure tolerates  CAD/CABG -Stable, continue aspirin, carvedilol, statin  Recent pulmonary embolism -Continue Xarelto  Depression -Continue duloxetine  Dyslipidemia -Continue statin  DVT prophylaxis: Xarelto Code Status: Full code  family  Communication: No family at bedside Disposition Plan: Home tomorrow if remains stable, await cards input as well  Consultants:   Cardiology   Procedures:   Antimicrobials:    Subjective: -Feels so much better after 2 doses of IV Lasix yesterday -Still with dyspnea on minimal exertion  Objective: Vitals:   11/28/18 1654 11/28/18 1936 11/29/18 0534 11/29/18 0827  BP: 105/80 99/77 96/82  106/88  Pulse: 98 (!) 118 (!) 106 (!) 103  Resp: 18 18 18 16   Temp: 98 F (36.7 C) 98.3 F (36.8 C) 98.2 F (36.8 C)   TempSrc: Oral Oral Oral   SpO2: 94% 97% 97% 100%  Weight:   80.1 kg   Height:        Intake/Output Summary (Last 24 hours) at 11/29/2018 1126 Last data filed at 11/29/2018 0800 Gross per 24 hour  Intake 840 ml  Output 1550 ml  Net -710 ml   Filed Weights   11/27/18 1648 11/28/18 1406 11/29/18 0534  Weight: 81.6 kg 81.5 kg 80.1 kg    Examination:  Gen: Awake, Alert, Oriented X 3,  HEENT: PERRLA, Neck supple, no JVD Lungs: improved air movement, lungs appear clear today CVS: RRR,No Gallops,Rubs or new Murmurs Abd: soft, Non tender, non distended, BS present Extremities: Trace edema Skin: no new rashes Psychiatry: Judgement and insight appear normal. Mood & affect appropriate.     Data Reviewed:   CBC: Recent Labs  Lab 11/27/18 2100  WBC 9.0  NEUTROABS 4.6  HGB 12.9  HCT 39.7  MCV 97.5  PLT 229   Basic Metabolic Panel: Recent Labs  Lab 11/27/18 2100 11/28/18 0138 11/29/18 0423  NA 138 138 141  K 3.5 3.6 3.6  CL 104 106 104  CO2 22 21* 27  GLUCOSE 100* 119* 105*  BUN 30* 30* 27*  CREATININE 1.05* 0.95 1.05*  CALCIUM 9.0 8.8* 8.9   GFR: Estimated Creatinine Clearance: 53.8 mL/min (A) (by C-G formula based on SCr of 1.05 mg/dL (H)). Liver Function Tests: Recent Labs  Lab 11/27/18 2100  AST 49*  ALT 71*  ALKPHOS 112  BILITOT 0.9  PROT 6.9  ALBUMIN 3.6   No results for input(s): LIPASE, AMYLASE in the last 168 hours. No results for  input(s): AMMONIA in the last 168 hours. Coagulation Profile: No results for input(s): INR, PROTIME in the last 168 hours. Cardiac Enzymes: Recent Labs  Lab 11/28/18 0138 11/28/18 0715  TROPONINI 0.07* 0.06*   BNP (last 3 results) No results for input(s): PROBNP in the last 8760 hours. HbA1C: No results for input(s): HGBA1C in the last 72 hours. CBG: No results for input(s): GLUCAP in the last 168 hours. Lipid Profile: No results for input(s): CHOL, HDL, LDLCALC, TRIG, CHOLHDL, LDLDIRECT in the last 72 hours. Thyroid Function Tests: No results for input(s): TSH, T4TOTAL, FREET4, T3FREE, THYROIDAB in the last 72 hours. Anemia Panel: No results for input(s): VITAMINB12, FOLATE, FERRITIN, TIBC, IRON, RETICCTPCT in the last 72 hours. Urine analysis:    Component Value Date/Time   COLORURINE YELLOW 09/21/2018 1414   APPEARANCEUR CLEAR 09/21/2018 1414   LABSPEC 1.029 09/21/2018 1414   PHURINE 6.0 09/21/2018 1414   GLUCOSEU NEGATIVE 09/21/2018 1414   HGBUR MODERATE (A) 09/21/2018 1414   BILIRUBINUR NEGATIVE 09/21/2018 1414   KETONESUR NEGATIVE 09/21/2018 1414   PROTEINUR NEGATIVE 09/21/2018 1414   NITRITE NEGATIVE 09/21/2018 1414   LEUKOCYTESUR NEGATIVE 09/21/2018 1414   Sepsis Labs: @LABRCNTIP (procalcitonin:4,lacticidven:4)  )No results found for this or any previous visit (from the past 240 hour(s)).       Radiology Studies: Dg Chest 2 View  Result Date: 11/27/2018 CLINICAL DATA:  Shortness of breath for 4-5 days. EXAM: CHEST - 2 VIEW COMPARISON:  Single-view of the chest and CT chest 11/05/2018. FINDINGS: The patient is status post CABG. There is cardiomegaly. Small right pleural effusion is noted. No left effusion. Lungs are clear. No pneumothorax. No acute bony abnormality. IMPRESSION: Cardiomegaly without edema. Small right pleural effusion. Electronically Signed   By: Inge Rise M.D.   On: 11/27/2018 18:20        Scheduled Meds: . aspirin  325 mg Oral  QPM  . atorvastatin  80 mg Oral q1800  . carvedilol  6.25 mg Oral BID WC  . cholecalciferol  1,000 Units Oral Daily  . DULoxetine  60 mg Oral BID  . gabapentin  600 mg Oral TID  . potassium chloride  40 mEq Oral Daily  . pramipexole  0.5 mg Oral QHS  . rivaroxaban  20 mg Oral Daily  . vitamin C  500 mg Oral Daily   Continuous Infusions:   LOS: 1 day    Time spent: 61min    Domenic Polite, MD [facility]   11/29/2018, 11:26 AM

## 2018-11-30 ENCOUNTER — Inpatient Hospital Stay: Payer: Self-pay

## 2018-11-30 DIAGNOSIS — I5023 Acute on chronic systolic (congestive) heart failure: Secondary | ICD-10-CM

## 2018-11-30 DIAGNOSIS — I50810 Right heart failure, unspecified: Secondary | ICD-10-CM

## 2018-11-30 DIAGNOSIS — I10 Essential (primary) hypertension: Secondary | ICD-10-CM

## 2018-11-30 LAB — BASIC METABOLIC PANEL
Anion gap: 10 (ref 5–15)
BUN: 28 mg/dL — ABNORMAL HIGH (ref 8–23)
CHLORIDE: 104 mmol/L (ref 98–111)
CO2: 25 mmol/L (ref 22–32)
Calcium: 8.7 mg/dL — ABNORMAL LOW (ref 8.9–10.3)
Creatinine, Ser: 1.05 mg/dL — ABNORMAL HIGH (ref 0.44–1.00)
GFR calc Af Amer: >60 mL/min (ref >60)
GFR calc non Af Amer: 56 mL/min — ABNORMAL LOW (ref >60)
Glucose, Bld: 103 mg/dL — ABNORMAL HIGH (ref 70–99)
Potassium: 3.6 mmol/L (ref 3.5–5.1)
SODIUM: 139 mmol/L (ref 135–145)

## 2018-11-30 LAB — CBC
HCT: 40.4 % (ref 36.0–46.0)
Hemoglobin: 13 g/dL (ref 12.0–15.0)
MCH: 31.8 pg (ref 26.0–34.0)
MCHC: 32.2 g/dL (ref 30.0–36.0)
MCV: 98.8 fL (ref 80.0–100.0)
Platelets: 224 10*3/uL (ref 150–400)
RBC: 4.09 MIL/uL (ref 3.87–5.11)
RDW: 13.1 % (ref 11.5–15.5)
WBC: 8.1 10*3/uL (ref 4.0–10.5)
nRBC: 0.9 % — ABNORMAL HIGH (ref 0.0–0.2)

## 2018-11-30 LAB — COOXEMETRY PANEL
Carboxyhemoglobin: 1 % (ref 0.5–1.5)
METHEMOGLOBIN: 1.1 % (ref 0.0–1.5)
O2 Saturation: 30.9 %
Total hemoglobin: 13.8 g/dL (ref 12.0–16.0)

## 2018-11-30 LAB — MAGNESIUM: MAGNESIUM: 2 mg/dL (ref 1.7–2.4)

## 2018-11-30 MED ORDER — SODIUM CHLORIDE 0.9% FLUSH
10.0000 mL | INTRAVENOUS | Status: DC | PRN
  Administered 2018-12-05: 10 mL
  Filled 2018-11-30: qty 40

## 2018-11-30 MED ORDER — FUROSEMIDE 10 MG/ML IJ SOLN
60.0000 mg | Freq: Two times a day (BID) | INTRAMUSCULAR | Status: DC
  Administered 2018-11-30 – 2018-12-02 (×4): 60 mg via INTRAVENOUS
  Filled 2018-11-30 (×4): qty 6

## 2018-11-30 MED ORDER — SPIRONOLACTONE 12.5 MG HALF TABLET
12.5000 mg | ORAL_TABLET | Freq: Every day | ORAL | Status: DC
  Administered 2018-11-30 – 2018-12-02 (×3): 12.5 mg via ORAL
  Filled 2018-11-30 (×3): qty 1

## 2018-11-30 MED ORDER — MILRINONE LACTATE IN DEXTROSE 20-5 MG/100ML-% IV SOLN
0.2500 ug/kg/min | INTRAVENOUS | Status: DC
  Administered 2018-11-30 – 2018-12-04 (×5): 0.25 ug/kg/min via INTRAVENOUS
  Filled 2018-11-30 (×6): qty 100

## 2018-11-30 MED ORDER — ASPIRIN EC 81 MG PO TBEC
81.0000 mg | DELAYED_RELEASE_TABLET | Freq: Every evening | ORAL | Status: DC
  Administered 2018-11-30 – 2018-12-06 (×7): 81 mg via ORAL
  Filled 2018-11-30 (×7): qty 1

## 2018-11-30 MED ORDER — POTASSIUM CHLORIDE CRYS ER 20 MEQ PO TBCR
20.0000 meq | EXTENDED_RELEASE_TABLET | Freq: Once | ORAL | Status: AC
  Administered 2018-11-30: 20 meq via ORAL
  Filled 2018-11-30: qty 1

## 2018-11-30 MED ORDER — SODIUM CHLORIDE 0.9% FLUSH
10.0000 mL | Freq: Two times a day (BID) | INTRAVENOUS | Status: DC
  Administered 2018-11-30 – 2018-12-06 (×11): 10 mL

## 2018-11-30 MED ORDER — DIGOXIN 125 MCG PO TABS
0.1250 mg | ORAL_TABLET | Freq: Every day | ORAL | Status: DC
  Administered 2018-11-30 – 2018-12-07 (×8): 0.125 mg via ORAL
  Filled 2018-11-30 (×8): qty 1

## 2018-11-30 NOTE — Progress Notes (Signed)
PROGRESS NOTE                                                                                                                                                                                                             Patient Demographics:    Jennifer [name], is a 63 y.o. female, DOB - [birth year], ZGY:[id number]  Admit date - 11/27/2018   Admitting Physician Shela Leff, MD  Outpatient Primary MD for the patient is Monico Blitz, MD  LOS - 2   Chief Complaint  Patient presents with  . Shortness of Breath       Brief Narrative   63 year old female with history of CAD, status post CABG in December 2019, ischemic cardiomyopathy, EF of 25%, hypertension, dyslipidemia, IBS was just diagnosed with pulmonary embolism 2 to 3 weeks ago at [facility] 1/20, started [place], presented to the ED from cardiology clinic secondary to dyspnea, her work-up significant for a drop in her EF 10 to 15%, as well significant for new mitral regurgitation was seen by CHF team.    Subjective:    [name] today has, No headache, No chest pain, No abdominal pain , she does report some dyspnea.   Assessment  & Plan :    Principal Problem:   Exertional dyspnea Active Problems:   HTN (hypertension)   Dyslipidemia, goal LDL below 70   Pulmonary embolism (HCC)   AKI (acute kidney injury) ([place])   Shortness of breath  Acute on chronic systolic CHF/severe MR/ RV systolic dysfunction -Dyspnea is multifactorial, in the setting of volume overload, acute on chronic systolic CHF, with worsening of her EF to 10 to 15%, as well as severe MR, well she had recently PE, which might still be contributing to her subjective dyspnea. -Diuresis per cardiology/CHF team -Continue with Lasix 40 mg IV twice daily, renal function closely, limited to soft blood pressure -All beta-blockers given her acute right ventricular failure -No ARB/Aldactone in the setting of low blood  pressure - Monitor Volume status closely, daily weight, strict ins and outs   Severe mitral regurgitation -Management per cardiology, new diagnosis on echo this admission,  CAD/CABG -Stable, continue aspirin,  Statin. -Blocker currently on hold per cardiology  History of recent bilateral PE -Diagnosed in [facility] last month, continue with Xarelto  Depression -Continue duloxetine  Dyslipidemia -Continue statin  Code Status : Full  Family Communication  : none at bedside  Disposition Plan  : home when stable  Barriers For Discharge : remains on IV lasix  Consults  :  Cardiology/CHF  Procedures  : None  DVT Prophylaxis  :  Xarelto  Lab Results  Component Value Date   PLT 224 11/30/2018    Antibiotics  :    Anti-infectives (From admission, onward)   None        Objective:   Vitals:   11/29/18 1355 11/29/18 1926 11/30/18 0451 11/30/18 1041  BP: 99/83 (!) 94/54 95/73 99/82   Pulse: (!) 109 (!) 114 (!) 105 (!) 112  Resp:  18 18   Temp:  98.1 F (36.7 C) 97.8 F (36.6 C)   TempSrc:  Oral Oral   SpO2: 98% 97% 94% 97%  Weight:   80.4 kg   Height:        Wt Readings from Last 3 Encounters:  11/30/18 80.4 kg  11/27/18 81.8 kg  11/08/18 79.8 kg     Intake/Output Summary (Last 24 hours) at 11/30/2018 1111 Last data filed at 11/30/2018 1041 Gross per 24 hour  Intake 820 ml  Output 900 ml  Net -80 ml     Physical Exam  Awake Alert, Oriented X 3, No new F.N deficits, Normal affect Symmetrical Chest wall movement, Good air movement bilaterally, CTAB RRR,No Gallops,Rubs , + Murmurs, No Parasternal Heave +ve B.Sounds, Abd Soft, No tenderness,  No rebound - guarding or rigidity. No Cyanosis, Clubbing or edema, No new Rash or bruise      Data Review:    CBC Recent Labs  Lab 11/27/18 2100 11/30/18 0540  WBC 9.0 8.1  HGB 12.9 13.0  HCT 39.7 40.4  PLT 233 224  MCV 97.5 98.8  MCH 31.7 31.8  MCHC 32.5 32.2  RDW 12.6 13.1   LYMPHSABS 3.4  --   MONOABS 0.6  --   EOSABS 0.3  --   BASOSABS 0.1  --     Chemistries  Recent Labs  Lab 11/27/18 2100 11/28/18 0138 11/29/18 0423 11/30/18 0540  NA 138 138 141 139  K 3.5 3.6 3.6 3.6  CL 104 106 104 104  CO2 22 21* 27 25  GLUCOSE 100* 119* 105* 103*  BUN 30* 30* 27* 28*  CREATININE 1.05* 0.95 1.05* 1.05*  CALCIUM 9.0 8.8* 8.9 8.7*  AST 49*  --   --   --   ALT 71*  --   --   --   ALKPHOS 112  --   --   --   BILITOT 0.9  --   --   --    ------------------------------------------------------------------------------------------------------------------ No results for input(s): CHOL, HDL, LDLCALC, TRIG, CHOLHDL, LDLDIRECT in the last 72 hours.  Lab Results  Component Value Date   HGBA1C 5.0 09/22/2018   ------------------------------------------------------------------------------------------------------------------ No results for input(s): TSH, T4TOTAL, T3FREE, THYROIDAB in the last 72 hours.  Invalid input(s): FREET3 ------------------------------------------------------------------------------------------------------------------ No results for input(s): VITAMINB12, FOLATE, FERRITIN, TIBC, IRON, RETICCTPCT in the last 72 hours.  Coagulation profile No results for input(s): INR, PROTIME in the last 168 hours.  No results for input(s): DDIMER in the last 72 hours.  Cardiac Enzymes Recent Labs  Lab 11/28/18 0138 11/28/18 0715  TROPONINI 0.07* 0.06*   ------------------------------------------------------------------------------------------------------------------    Component Value Date/Time   BNP 1,311.1 (H) 11/27/2018 2041    Inpatient Medications  Scheduled Meds: . aspirin  81 mg Oral QPM  . atorvastatin  80  mg Oral q1800  . cholecalciferol  1,000 Units Oral Daily  . digoxin  0.125 mg Oral Daily  . DULoxetine  60 mg Oral BID  . furosemide  40 mg Intravenous Daily  . gabapentin  600 mg Oral TID  . potassium chloride  40 mEq Oral Daily   . pramipexole  0.5 mg Oral QHS  . rivaroxaban  20 mg Oral Daily  . vitamin C  500 mg Oral Daily   Continuous Infusions: PRN Meds:.acetaminophen **OR** acetaminophen  Micro Results No results found for this or any previous visit (from the past 240 hour(s)).  Radiology Reports Dg Chest 2 View  Result Date: 11/27/2018 CLINICAL DATA:  Shortness of breath for 4-5 days. EXAM: CHEST - 2 VIEW COMPARISON:  Single-view of the chest and CT chest 11/05/2018. FINDINGS: The patient is status post CABG. There is cardiomegaly. Small right pleural effusion is noted. No left effusion. Lungs are clear. No pneumothorax. No acute bony abnormality. IMPRESSION: Cardiomegaly without edema. Small right pleural effusion. Electronically Signed   By: Inge Rise M.D.   On: 11/27/2018 18:20     Phillips Climes M.D on 11/30/2018 at 11:11 AM  Between 7am to 7pm - Pager - [phone]  After 7pm go to [email or web address] - password [facility]  [facility] -  Office  [phone]

## 2018-11-30 NOTE — Progress Notes (Signed)
Transfer: Patient transferred from 3 EAST via bed on room air. O2 SAT 98-99%. Alert and oriented  X 4. Blood pressure monitoring and heart rate  Monitoring initiated. Safety precautions discussed. Patient verbalizes an understanding of using the call bell for assistance.

## 2018-11-30 NOTE — Progress Notes (Signed)
CVP monitoring initiated. Recent CVP reading: 22.

## 2018-11-30 NOTE — Progress Notes (Signed)
ANTICOAGULATION CONSULT NOTE - Initial Consult  Pharmacy Consult for IV heparin Indication: Hx DVT/PE, holding Xarelto  Allergies  Allergen Reactions  . Poison Oak Extract Rash    Patient Measurements: Height: 5\' 2"  (157.5 cm) Weight: 177 lb 3.2 oz (80.4 kg)(scale a) IBW/kg (Calculated) : 50.1 Heparin Dosing Weight: 68.3 kg  Vital Signs: Temp: 98.7 F (37.1 C) (02/13 1614) Temp Source: Oral (02/13 1614) BP: 128/61 (02/13 1805) Pulse Rate: 116 (02/13 1805)  Labs: Recent Labs    11/27/18 2100 11/28/18 0138 11/28/18 0715 11/29/18 0423 11/30/18 0540  HGB 12.9  --   --   --  13.0  HCT 39.7  --   --   --  40.4  PLT 233  --   --   --  224  CREATININE 1.05* 0.95  --  1.05* 1.05*  TROPONINI  --  0.07* 0.06*  --   --     Estimated Creatinine Clearance: 53.8 mL/min (A) (by C-G formula based on SCr of 1.05 mg/dL (H)).   Medical History: Past Medical History:  Diagnosis Date  . Arthritis    "probably; maybe in my hands" (09/21/2018)  . Congestive heart failure (CHF) ([place])   . Dyspnea   . Fibromyalgia   . GERD (gastroesophageal reflux disease)   . Hypercholesteremia   . Hypertension   . IBS (irritable bowel syndrome)   . Mild sleep apnea    no CPAP (09/21/2018)  . Myocardial infarction [facility])    "was told I have had one; never knew about it" (09/21/2018)  . Nonischemic cardiomyopathy (HCC)    ECHO EF 40-45%, Septal Thickness,and mild global left ventricular dysfunction; stress test 03/2004 +VE, refersible defect; ECHO 03/2005 @[facility]  EF 55%  . Pulmonary embolus (HCC)     Medications:  Infusions:  . milrinone      Assessment: 63 yo female on chronic Xarelto for hx DVT/PE.  Pharmacy asked to switch to heparin in case mechanical support is needed.  No overt bleeding or complications noted.  Last dose of Xarelto was taken today at 1043 AM.  Goal of Therapy:  Heparin level 0.3-0.7 units/ml Monitor platelets by anticoagulation protocol: Yes   Plan:  Baseline aPTT and  heparin level with AM labs. Tomorrow (2/14) at 11 AM, will start IV heparin at 950 units/hr (no bolus) Check heparin level 6 hrs after gtt starts. Daily heparin level and CBC.  Marguerite Olea, [facility] Clinical Pharmacist Phone [phone]  11/30/2018 7:48 PM

## 2018-11-30 NOTE — Progress Notes (Signed)
Peripherally Inserted Central Catheter/Midline Placement  The IV Nurse has discussed with the patient and/or persons authorized to consent for the patient, the purpose of this procedure and the potential benefits and risks involved with this procedure.  The benefits include less needle sticks, lab draws from the catheter, and the patient may be discharged home with the catheter. Risks include, but not limited to, infection, bleeding, blood clot (thrombus formation), and puncture of an artery; nerve damage and irregular heartbeat and possibility to perform a PICC exchange if needed/ordered by physician.  Alternatives to this procedure were also discussed.  B[employer] Power PICC patient education guide, fact sheet on infection prevention and patient information card has been provided to patient /or left at bedside.    PICC/Midline Placement Documentation        Jennifer [name] 11/30/2018, 4:50 PM

## 2018-11-30 NOTE — Consult Note (Addendum)
Advanced Heart Failure Team Consult Note   Primary Physician: Monico Blitz, MD PCP-Cardiologist:  Kate Sable, MD  Reason for Consultation: Acute RV Failure  HPI:    Jennifer [name] is seen today for evaluation of acute RV failure at the request of Dr Margaretann Loveless.   Jennifer [name] is a 63 y.o. female with a history of NICM 15 years ago with EF 40-45%. She was previously followed by Dr Domenic Polite. She was first seen by Dr Bronson Ing 11/07/19with chest pain x1 year. Exercise Myoview study done November 15th was high risk. Echocardiogram November 21st showed severe LV dysfunction with an EF of 20-25%. Catheterization done 09/21/18 revealed 90% proximal LAD and a 50% circumflex stenosis. She underwent bypass grafting x2 with an L IMA to the LAD and an SVG to the OM on 09/22/18 with Dr Roxan Hockey.   She was recently admitted to [facility] 10/2018 with DVT and bilateral PE. Started on Xarelto. She was also diuresed with IV lasix. Echo done at that time showed EF 10-15%, RV mildly reduced.  She saw Dr Bronson Ing 11/08/18 and was hypotensive and fatigued, so her losartan was stopped.   She was seen 11/27/18 by Kerin Ransom with new SOB with minimal exertion x 2-3 days. She was sent to ED for further evaluation with concerns for elevated PA pressures/RV failure secondary to recent PE.   She tells me that she was having CP for about 1 year prior to being seen by Dr Bronson Ing. None since her CABG. NICM was though to be due to viral illness. [place] is very [place] typically. She had been walking daily since her surgery and was doing okay up until the last few days prior to admission. She took several of her PRN lasix for increased SOB. She continued to be SOB, so she was seen by Kerin Ransom. Weight up 1 lb at home. No edema, orthopnea, or PND. No dizziness. +dry cough. She snores and had a sleep study "years ago", which was borderline.   Pertinent admission labs include: K 3.5,  creatinine 1.05, BNP 1311, troponin 0.02,7 > 0.06, hemoglobin 12.9 CXR: CM, no edema, small right pleural effusion Sinus tach 109 bpm with TWI in inferior leads (new as of EKG 12/6).   Cardiology consulted. Coreg switched to Toprol to allow more BP room. Continued 40 mg IV lasix as BP allowed.   Diuresing with 40 mg IV lasix BID with modest UOP. Creatinine stable 1.05. SBP 90s. HR 100-110s. Weight down 3 lbs.   She feels a little better, but is still SOB with minimal exertion. No CP.   Echo 08/2018: EF 20-25%, LA severely dilated, mild MR, RV normal TEE 12/19: RV normal, trace TR, mild MR Echo 1/20 [facility]): EF 10-15%, RV mildly reduced, mod to severe MR, mild TR Echo 11/28/18: EF 10-15%, RV moderately reduced, severe functional MR  SH: Lives in [place]. Daughter and son-in-law stay with her. Recently widowed. No ETOH. Former tobacco use 1 ppd from ages [age]. No drug use. Drives to appointments. Having some trouble paying for medications with decreased income after her husband passed away  FH: Brother died with CHF at 69 (thought to be viral), Mother died with CHF at 64.   Review of Systems: [y] = yes, [ ]  = no   . General: Weight gain Blue.Reese ]; Weight loss [ ] ; Anorexia [ ] ; Fatigue Blue.Reese ]; Fever [ ] ; Chills [ ] ; Weakness [ ]   . Cardiac:  Chest pain/pressure [ ] ; Resting SOB [ ] ; Exertional SOB Blue.Reese ]; Orthopnea [ ] ; Pedal Edema [ ] ; Palpitations [ ] ; Syncope [ ] ; Presyncope [ ] ; Paroxysmal nocturnal dyspnea[ ]   . Pulmonary: Cough Blue.Reese ]; Wheezing[ ] ; Hemoptysis[ ] ; Sputum [ ] ; Snoring Blue.Reese ]  . GI: Vomiting[ ] ; Dysphagia[ ] ; Melena[ ] ; Hematochezia [ ] ; Heartburn[ ] ; Abdominal pain [ ] ; Constipation [ ] ; Diarrhea [ ] ; BRBPR [ ]   . GU: Hematuria[ ] ; Dysuria [ ] ; Nocturia[ ]   . Vascular: Pain in legs with walking [ ] ; Pain in feet with lying flat [ ] ; Non-healing sores [ ] ; Stroke [ ] ; TIA [ ] ; Slurred speech [ ] ;  . Neuro: Headaches[ ] ; Vertigo[ ] ; Seizures[ ] ; Paresthesias[ ] ;Blurred  vision [ ] ; Diplopia [ ] ; Vision changes [ ]   . Ortho/Skin: Arthritis [ ] ; Joint pain [ ] ; Muscle pain [ ] ; Joint swelling [ ] ; Back Pain [ ] ; Rash [ ]   . Psych: Depression[ ] ; Anxiety[ ]   . Heme: Bleeding problems [ ] ; Clotting disorders [ ] ; Anemia [ ]   . Endocrine: Diabetes [ ] ; Thyroid dysfunction[ ]   Home Medications Prior to Admission medications   Medication Sig Start Date End Date Taking? Authorizing Provider  aspirin EC 325 MG EC tablet Take 1 tablet (325 mg total) by mouth daily. Patient taking differently: Take 325 mg by mouth every evening.  09/28/18  Yes Harriet Pho, Tessa N, PA-C  atorvastatin (LIPITOR) 80 MG tablet Take 1 tablet (80 mg total) by mouth daily at 6 PM. 09/28/18  Yes Harriet Pho, Tessa N, PA-C  carvedilol (COREG) 6.25 MG tablet Take 1 tablet (6.25 mg total) by mouth 2 (two) times daily with a meal. 09/28/18  Yes Harriet Pho, Tessa N, PA-C  cholecalciferol (VITAMIN D3) 25 MCG (1000 UT) tablet Take 1,000 Units by mouth daily.   Yes [provider]  DULoxetine (CYMBALTA) 60 MG capsule Take 60 mg by mouth 2 (two) times daily.   Yes [provider]  furosemide (LASIX) 40 MG tablet Take 1 tablet (40 mg total) by mouth daily as needed. Patient taking differently: Take 40 mg by mouth daily as needed for fluid.  11/08/18 02/06/19 Yes Herminio Commons, MD  gabapentin (NEURONTIN) 300 MG capsule Take 600 mg by mouth 3 (three) times daily.   Yes [provider]  hydrochlorothiazide (HYDRODIURIL) 25 MG tablet Take 25 mg by mouth daily.   Yes [provider]  potassium chloride SA (K-DUR,KLOR-CON) 20 MEQ tablet Take 1 tablet (20 mEq total) by mouth daily as needed. Patient taking differently: Take 20 mEq by mouth daily as needed (when taking lasix).  11/08/18  Yes Herminio Commons, MD  pramipexole (MIRAPEX) 0.5 MG tablet Take 0.5 mg by mouth at bedtime.    Yes [provider]  Rivaroxaban (XARELTO) 15 MG TABS tablet Take 15 mg by mouth 2 (two) times  daily with a meal.   Yes [provider]  vitamin C (ASCORBIC ACID) 500 MG tablet Take 500 mg by mouth daily.   Yes [provider]    Past Medical History: Past Medical History:  Diagnosis Date  . Arthritis    "probably; maybe in my hands" (09/21/2018)  . Congestive heart failure (CHF) ([place])   . Dyspnea   . Fibromyalgia   . GERD (gastroesophageal reflux disease)   . Hypercholesteremia   . Hypertension   . IBS (irritable bowel syndrome)   . Mild sleep apnea    no CPAP (09/21/2018)  .  Myocardial infarction [facility])    "was told I have had one; never knew about it" (09/21/2018)  . Nonischemic cardiomyopathy (HCC)    ECHO EF 40-45%, Septal Thickness,and mild global left ventricular dysfunction; stress test 03/2004 +VE, refersible defect; ECHO 03/2005 @[facility]  EF 55%  . Pulmonary embolus [facility])     Past Surgical History: Past Surgical History:  Procedure Laterality Date  . ANKLE FRACTURE SURGERY Left 2000  . BUNIONECTOMY Right 1990s?   "& removed part of my great toe due to fungus under nail"  . CARDIAC CATHETERIZATION  03/2005  . CORONARY ARTERY BYPASS GRAFT N/A 09/22/2018   Procedure: CORONARY ARTERY BYPASS GRAFTING (CABG), ON PUMP, TIMES TWO, USING LEFT INTERNAL MAMMARY ARTERY AND ENDOSCOPICALLY HARVESTED RIGHT GREATER SAPHENOUS VEIN;  Surgeon: Melrose Nakayama, MD;  Location: [place];  Service: Open Heart Surgery;  Laterality: N/A;  . DILATION AND CURETTAGE OF UTERUS    . FRACTURE SURGERY    . RIGHT/LEFT HEART CATH AND CORONARY ANGIOGRAPHY N/A 09/21/2018   Procedure: RIGHT/LEFT HEART CATH AND CORONARY ANGIOGRAPHY;  Surgeon: Troy Sine, MD;  Location: [place] CV LAB;  Service: Cardiovascular;  Laterality: N/A;  . TEE WITHOUT CARDIOVERSION N/A 09/22/2018   Procedure: TRANSESOPHAGEAL ECHOCARDIOGRAM (TEE);  Surgeon: Melrose Nakayama, MD;  Location: [place];  Service: Open Heart Surgery;  Laterality: N/A;  . TUBAL LIGATION      Family History: Family History    Problem Relation Age of Onset  . Diabetes Mellitus II Mother   . Heart disease Mother 85  . Colon cancer Father   . Alcohol abuse Father   . Depression Father   . Congestive Heart Failure Brother 29       probably dilated cardiomyopathy after PNA    Social History: Social History   Socioeconomic History  . Marital status: Widowed    Spouse name: Not on file  . Number of children: Not on file  . Years of education: Not on file  . Highest education level: Not on file  Occupational History  . Not on file  Social Needs  . Financial resource strain: Not on file  . Food insecurity:    Worry: Not on file    Inability: Not on file  . Transportation needs:    Medical: Not on file    Non-medical: Not on file  Tobacco Use  . Smoking status: Former Smoker    Packs/day: 1.00    Years: 30.00    Pack years: 30.00    Types: Cigarettes    Last attempt to quit: 1992    Years since quitting: 28.1  . Smokeless tobacco: Never Used  Substance and Sexual Activity  . Alcohol use: Not Currently    Frequency: Never  . Drug use: Never  . Sexual activity: Not Currently  Lifestyle  . Physical activity:    Days per week: Not on file    Minutes per session: Not on file  . Stress: Not on file  Relationships  . Social connections:    Talks on phone: Not on file    Gets together: Not on file    Attends religious service: Not on file    Active member of club or organization: Not on file    Attends meetings of clubs or organizations: Not on file    Relationship status: Not on file  Other Topics Concern  . Not on file  Social History Narrative  . Not on file    Allergies:  Allergies  Allergen  Reactions  . Poison Oak Extract Rash    Objective:    Vital Signs:   Temp:  [97.8 F (36.6 C)-98.3 F (36.8 C)] 97.8 F (36.6 C) (02/13 0451) Pulse Rate:  [105-115] 105 (02/13 0451) Resp:  [18-19] 18 (02/13 0451) BP: (94-99)/(54-83) 95/73 (02/13 0451) SpO2:  [94 %-98 %] 94 % (02/13  0451) Weight:  [80.4 kg] 80.4 kg (02/13 0451) Last BM Date: 11/29/18  Weight change: Filed Weights   11/28/18 1406 11/29/18 0534 11/30/18 0451  Weight: 81.5 kg 80.1 kg 80.4 kg    Intake/Output:   Intake/Output Summary (Last 24 hours) at 11/30/2018 0950 Last data filed at 11/29/2018 2200 Gross per 24 hour  Intake 580 ml  Output 900 ml  Net -320 ml      Physical Exam    General:  SOB with movement. No resp difficulty HEENT: normal Neck: supple. JVP 10-12. Carotids 2+ bilat; no bruits. No lymphadenopathy or thyromegaly appreciated. Cor: PMI nondisplaced. Regular, tachy. No rubs, gallops or murmurs. Lungs: crackles in bases Abdomen: soft, nontender, nondistended. No hepatosplenomegaly. No bruits or masses. Good bowel sounds. Extremities: no cyanosis, clubbing, rash, edema, cool extremities Neuro: alert & orientedx3, cranial nerves grossly intact. moves all 4 extremities w/o difficulty. Affect pleasant   Telemetry   Sinus tach 100-110s. One run of SVT 150s. Personally reviewed.   EKG    Sinus tach 109 bpm with TWI in inferior leads (new as of EKG 12/6).   Labs   Basic Metabolic Panel: Recent Labs  Lab 11/27/18 2100 11/28/18 0138 11/29/18 0423 11/30/18 0540  NA 138 138 141 139  K 3.5 3.6 3.6 3.6  CL 104 106 104 104  CO2 22 21* 27 25  GLUCOSE 100* 119* 105* 103*  BUN 30* 30* 27* 28*  CREATININE 1.05* 0.95 1.05* 1.05*  CALCIUM 9.0 8.8* 8.9 8.7*    Liver Function Tests: Recent Labs  Lab 11/27/18 2100  AST 49*  ALT 71*  ALKPHOS 112  BILITOT 0.9  PROT 6.9  ALBUMIN 3.6   No results for input(s): LIPASE, AMYLASE in the last 168 hours. No results for input(s): AMMONIA in the last 168 hours.  CBC: Recent Labs  Lab 11/27/18 2100 11/30/18 0540  WBC 9.0 8.1  NEUTROABS 4.6  --   HGB 12.9 13.0  HCT 39.7 40.4  MCV 97.5 98.8  PLT 233 224    Cardiac Enzymes: Recent Labs  Lab 11/28/18 0138 11/28/18 0715  TROPONINI 0.07* 0.06*    BNP: BNP (last 3  results) Recent Labs    11/27/18 2041  BNP 1,311.1*    ProBNP (last 3 results) No results for input(s): PROBNP in the last 8760 hours.   CBG: No results for input(s): GLUCAP in the last 168 hours.  Coagulation Studies: No results for input(s): LABPROT, INR in the last 72 hours.   Imaging    No results found.   Medications:     Current Medications: . aspirin  325 mg Oral QPM  . atorvastatin  80 mg Oral q1800  . cholecalciferol  1,000 Units Oral Daily  . DULoxetine  60 mg Oral BID  . furosemide  40 mg Intravenous Daily  . gabapentin  600 mg Oral TID  . metoprolol succinate  12.5 mg Oral Daily  . potassium chloride  40 mEq Oral Daily  . pramipexole  0.5 mg Oral QHS  . rivaroxaban  20 mg Oral Daily  . vitamin C  500 mg Oral Daily     Infusions:  Patient Profile   OVETA IDRIS is a 63 y.o. female with a history of CAD s/p CABG 09/2018, longstanding chronic systolic HF due to NICM 55+ years ago, HTN, HL, and fibromyalgia. Recent diagnosis (1/20) of bilateral PE on xarelto.   Admitted with worsening SOB.   Assessment/Plan   1. A/C systolic HF with new RV failure. Prior hx of NICM x 15+ years, now with ICM with CABG 09/2018. New RV failure secondary to acute bilateral PE. - Echo 11/28/18: EF 10-15%, RV moderately reduced, severe functional MR - Volume elevated on exam - Increase lasix to 60 mg IV BID. Limited by soft BP. She took lasix 40 mg PRN PTA. - Stop BB with acute RV failure. - Start digoxin 0.125 mg daily - Hold off on ARB/spiro with soft BP for now. - She will likely need RHC.   2. Bilateral PE - Diagnosed at [facility] 10/2018 - Continue Xarelto. Not requiring O2.  3. CAD s/p CABG 09/2018 - LIMA to the LAD and an SVG to the OM on 09/22/18 with Dr Roxan Hockey - No s/s ischemia - Can probably cut back full dose ASA now that she is on Xarelto  4. Severe MR - New diagnosis on echo this admit. Echo at [facility] showed mod to severe MR  10/2018. Mild MR on TEE 09/22/18.  5. HTN -> hypotension - Hypotensive with SBP 90s.  6. SVT - Add on mag - K 3.6. Supp.   7. ?OSA - Will need repeat sleep study outpatient.  Medication concerns reviewed with patient and pharmacy team. Barriers identified: affording medications.  Addendum: Place PICC line and transfer to stepdown.   Length of Stay: Burnett, NP  11/30/2018, 9:50 AM  Advanced Heart Failure Team Pager [phone] (M-F; 7a - 4p)  Please contact [facility] for night-coverage after hours (4p -7a ) and weekends on [email or web address]  Agree with above.   63 y/o woman with previous NICM EF 40-45% remotely. Found to have EF 20-25% in 11/19. Cath with 2v CAD and underwent CABG in 12/19. In January developed progressive SOB and admitted to [facility]. EF 10-15%. Work-up revealed DVT and PE.   Now readmitted with profound SOB and Class IV symptoms. Echo reviewed personally and EF 10-15% with Moderate RV dysfunction.   On exam Tachypneic JVP to ear Cor tachy regular + s3 Lungs + crackles  Ab distended Ext cool no edema.   She has low-output HF in the setting of mixed ischemic and nonischemic CM with biventricular dysfunction. PICC placed and co-ox 30% confirming cardiogenic shock physiology. Will start milrinone, digoxin and spiro. Continue IV lasix. Discussed possible need for advanced therapies. Change Xarelto to heparin in case we need to place support devices.   CRITICAL CARE Performed by: Glori Bickers  Total critical care time: 45 minutes  Critical care time was exclusive of separately billable procedures and treating other patients.  Critical care was necessary to treat or prevent imminent or life-threatening deterioration.  Critical care was time spent personally by me (independent of midlevel providers or residents) on the following activities: development of treatment plan with patient and/or surrogate as well as nursing, discussions with  consultants, evaluation of patient's response to treatment, examination of patient, obtaining history from patient or surrogate, ordering and performing treatments and interventions, ordering and review of laboratory studies, ordering and review of radiographic studies, pulse oximetry and re-evaluation of patient's condition.  Glori Bickers, MD  7:23 PM

## 2018-12-01 LAB — BASIC METABOLIC PANEL
Anion gap: 11 (ref 5–15)
BUN: 24 mg/dL — ABNORMAL HIGH (ref 8–23)
CO2: 27 mmol/L (ref 22–32)
CREATININE: 0.98 mg/dL (ref 0.44–1.00)
Calcium: 9 mg/dL (ref 8.9–10.3)
Chloride: 99 mmol/L (ref 98–111)
GFR calc non Af Amer: >60 mL/min (ref >60)
Glucose, Bld: 169 mg/dL — ABNORMAL HIGH (ref 70–99)
Potassium: 3.5 mmol/L (ref 3.5–5.1)
Sodium: 137 mmol/L (ref 135–145)

## 2018-12-01 LAB — COOXEMETRY PANEL
Carboxyhemoglobin: 2 % — ABNORMAL HIGH (ref 0.5–1.5)
Carboxyhemoglobin: 1.8 % — ABNORMAL HIGH (ref 0.5–1.5)
Methemoglobin: 1 % (ref 0.0–1.5)
Methemoglobin: 1.6 % — ABNORMAL HIGH (ref 0.0–1.5)
O2 Saturation: 82.8 %
O2 Saturation: 70.1 %
Total hemoglobin: 12.6 g/dL (ref 12.0–16.0)
Total hemoglobin: 12.8 g/dL (ref 12.0–16.0)

## 2018-12-01 LAB — CBC
HCT: 38.7 % (ref 36.0–46.0)
HEMOGLOBIN: 13.2 g/dL (ref 12.0–15.0)
MCH: 33.2 pg (ref 26.0–34.0)
MCHC: 34.1 g/dL (ref 30.0–36.0)
MCV: 97.2 fL (ref 80.0–100.0)
Platelets: 208 10*3/uL (ref 150–400)
RBC: 3.98 MIL/uL (ref 3.87–5.11)
RDW: 13.1 % (ref 11.5–15.5)
WBC: 8.5 10*3/uL (ref 4.0–10.5)
nRBC: 0.4 % — ABNORMAL HIGH (ref 0.0–0.2)

## 2018-12-01 LAB — APTT
APTT: 36 s (ref 24–36)
aPTT: 64 seconds — ABNORMAL HIGH (ref 24–36)

## 2018-12-01 LAB — HEPARIN LEVEL (UNFRACTIONATED)
Heparin Unfractionated: 2.02 IU/mL — ABNORMAL HIGH (ref 0.30–0.70)
Heparin Unfractionated: 1.92 IU/mL — ABNORMAL HIGH (ref 0.30–0.70)

## 2018-12-01 MED ORDER — HEPARIN (PORCINE) 25000 UT/250ML-% IV SOLN
1200.0000 [IU]/h | INTRAVENOUS | Status: DC
  Administered 2018-12-01: 950 [IU]/h via INTRAVENOUS
  Administered 2018-12-02: 1450 [IU]/h via INTRAVENOUS
  Administered 2018-12-04: 1350 [IU]/h via INTRAVENOUS
  Administered 2018-12-05: 1200 [IU]/h via INTRAVENOUS
  Filled 2018-12-01 (×5): qty 250

## 2018-12-01 NOTE — Progress Notes (Signed)
CVP 0800 - 22 CVP 1200 - 20 CVP 1600 - 21  Alert and oriented  X 4. Continues on Milrinone and Heparin IV medications with no adverse reactions. O2 SAT 98-99% on room air. Remains afebrile. Able to make needs known. Call bell within reach. Encouraged to report s/s to RN.

## 2018-12-01 NOTE — Progress Notes (Signed)
PROGRESS NOTE                                                                                                                                                                                                             Patient Demographics:    Jennifer [name], is a 63 y.o. female, DOB - [birth year], JGG:[id number]  Admit date - 11/27/2018   Admitting Physician Shela Leff, MD  Outpatient Primary MD for the patient is Monico Blitz, MD  LOS - 3   Chief Complaint  Patient presents with  . Shortness of Breath       Brief Narrative   63 year old female with history of CAD, status post CABG in December 2019, ischemic cardiomyopathy, EF of 25%, hypertension, dyslipidemia, IBS was just diagnosed with pulmonary embolism 2 to 3 weeks ago at [facility] 1/20, started Benjamin Perez, presented to the ED from cardiology clinic secondary to dyspnea, her work-up significant for a drop in her EF 10 to 15%, as well significant for new mitral regurgitation was seen by CHF team.  PICC line inserted, started on milrinone drip.    Subjective:    Jennifer [name] today she is feeling better, has more energy, dyspnea has improved, she was able to move around the room without significant dyspnea .   Assessment  & Plan :    Principal Problem:   Exertional dyspnea Active Problems:   HTN (hypertension)   Dyslipidemia, goal LDL below 70   Pulmonary embolism (HCC)   AKI (acute kidney injury) (HCC)   Shortness of breath  Acute on chronic systolic CHF RV systolic dysfunction -Dyspnea is multifactorial, in the setting of volume overload, acute on chronic systolic CHF, with worsening of her EF to 10 to 15%, as well as severe MR, well she had recently PE, which might still be contributing to her subjective dyspnea. -Reassess by cardiology, continue with IV Lasix for now, CVP is down 10-11, she is started on milrinone drip given low CO OX, beta-blockers given acute right ventricular  failure, she is on digoxin, low-dose Aldactone. -She will need right heart cath -No ARB/Aldactone in the setting of low blood pressure - Monitor Volume status closely, daily weight, strict ins and outs  Severe mitral regurgitation -Management per cardiology, new diagnosis on echo this admission,  CAD/CABG -Stable, continue aspirin,  Statin. -Beta-blocker currently on hold -  LIMA to the LAD and an SVG to the OM on 12/6/19with Dr Roxan Hockey  History of recent bilateral PE -Diagnosed in [place] last month, she is on Xarelto, currently on heparin GTT for possible need for procedures.  Depression -Continue duloxetine  Dyslipidemia -Continue statin    Code Status : Full  Family Communication  : none at bedside  Disposition Plan  : home when stable  Barriers For Discharge : remains on IV lasix, milrinone drip  Consults  :  Cardiology/CHF  Procedures  : None  DVT Prophylaxis  :  Xarelto>> heparin GTT  Lab Results  Component Value Date   PLT 224 11/30/2018    Antibiotics  :    Anti-infectives (From admission, onward)   None        Objective:   Vitals:   11/30/18 2350 12/01/18 0357 12/01/18 0758 12/01/18 0921  BP: 96/65 (!) 92/52 (!) 88/47   Pulse: (!) 107  (!) 107 (!) 113  Resp:      Temp: 98.7 F (37.1 C) 98 F (36.7 C) 97.8 F (36.6 C)   TempSrc: Oral Oral Oral   SpO2: 95%  94%   Weight:  80.1 kg    Height:        Wt Readings from Last 3 Encounters:  12/01/18 80.1 kg  11/27/18 81.8 kg  11/08/18 79.8 kg     Intake/Output Summary (Last 24 hours) at 12/01/2018 1002 Last data filed at 12/01/2018 0700 Gross per 24 hour  Intake 241 ml  Output 1900 ml  Net -1659 ml     Physical Exam  Awake Alert, Oriented X 3, No new F.N deficits, Normal affect,+JVP Symmetrical Chest wall movement, Good air movement bilaterally, CTAB RRR,No Gallops,Rubs , Has + Murmurs, No Parasternal Heave +ve B.Sounds, Abd Soft, No tenderness, No rebound - guarding  or rigidity. No Cyanosis, Clubbing or edema, No new Rash or bruise       Data Review:    CBC Recent Labs  Lab 11/27/18 2100 11/30/18 0540  WBC 9.0 8.1  HGB 12.9 13.0  HCT 39.7 40.4  PLT 233 224  MCV 97.5 98.8  MCH 31.7 31.8  MCHC 32.5 32.2  RDW 12.6 13.1  LYMPHSABS 3.4  --   MONOABS 0.6  --   EOSABS 0.3  --   BASOSABS 0.1  --     Chemistries  Recent Labs  Lab 11/27/18 2100 11/28/18 0138 11/29/18 0423 11/30/18 0540  NA 138 138 141 139  K 3.5 3.6 3.6 3.6  CL 104 106 104 104  CO2 22 21* 27 25  GLUCOSE 100* 119* 105* 103*  BUN 30* 30* 27* 28*  CREATININE 1.05* 0.95 1.05* 1.05*  CALCIUM 9.0 8.8* 8.9 8.7*  MG  --   --   --  2.0  AST 49*  --   --   --   ALT 71*  --   --   --   ALKPHOS 112  --   --   --   BILITOT 0.9  --   --   --    ------------------------------------------------------------------------------------------------------------------ No results for input(s): CHOL, HDL, LDLCALC, TRIG, CHOLHDL, LDLDIRECT in the last 72 hours.  Lab Results  Component Value Date   HGBA1C 5.0 09/22/2018   ------------------------------------------------------------------------------------------------------------------ No results for input(s): TSH, T4TOTAL, T3FREE, THYROIDAB in the last 72 hours.  Invalid input(s): FREET3 ------------------------------------------------------------------------------------------------------------------ No results for input(s): VITAMINB12, FOLATE, FERRITIN, TIBC, IRON, RETICCTPCT in the last 72 hours.  Coagulation profile No results for  input(s): INR, PROTIME in the last 168 hours.  No results for input(s): DDIMER in the last 72 hours.  Cardiac Enzymes Recent Labs  Lab 11/28/18 0138 11/28/18 0715  TROPONINI 0.07* 0.06*   ------------------------------------------------------------------------------------------------------------------    Component Value Date/Time   BNP 1,311.1 (H) 11/27/2018 2041    Inpatient  Medications  Scheduled Meds: . aspirin  81 mg Oral QPM  . atorvastatin  80 mg Oral q1800  . cholecalciferol  1,000 Units Oral Daily  . digoxin  0.125 mg Oral Daily  . DULoxetine  60 mg Oral BID  . furosemide  60 mg Intravenous BID  . gabapentin  600 mg Oral TID  . potassium chloride  40 mEq Oral Daily  . pramipexole  0.5 mg Oral QHS  . sodium chloride flush  10-40 mL Intracatheter Q12H  . spironolactone  12.5 mg Oral Daily  . vitamin C  500 mg Oral Daily   Continuous Infusions: . milrinone 0.25 mcg/kg/min (12/01/18 0926)   PRN Meds:.acetaminophen **OR** acetaminophen, sodium chloride flush  Micro Results No results found for this or any previous visit (from the past 240 hour(s)).  Radiology Reports Dg Chest 2 View  Result Date: 11/27/2018 CLINICAL DATA:  Shortness of breath for 4-5 days. EXAM: CHEST - 2 VIEW COMPARISON:  Single-view of the chest and CT chest 11/05/2018. FINDINGS: The patient is status post CABG. There is cardiomegaly. Small right pleural effusion is noted. No left effusion. Lungs are clear. No pneumothorax. No acute bony abnormality. IMPRESSION: Cardiomegaly without edema. Small right pleural effusion. Electronically Signed   By: Inge Rise M.D.   On: 11/27/2018 18:20   [place] Ekg Site Rite  Result Date: 11/30/2018 If Site Rite image not attached, placement could not be confirmed due to current cardiac rhythm.    Phillips Climes M.D on 12/01/2018 at 10:02 AM  Between 7am to 7pm - Pager - [phone]  After 7pm go to [email or web address] - password [facility]  [facility] -  Office  [phone]

## 2018-12-01 NOTE — Progress Notes (Signed)
[place] for IV heparin Indication: Hx DVT/PE, holding Xarelto  Allergies  Allergen Reactions  . Poison Oak Extract Rash    Patient Measurements: Height: 5\' 2"  (157.5 cm) Weight: 176 lb 9.4 oz (80.1 kg) IBW/kg (Calculated) : 50.1 Heparin Dosing Weight: 68.3 kg  Vital Signs: Temp: 97.8 F (36.6 C) (02/14 0758) Temp Source: Oral (02/14 0758) BP: 88/47 (02/14 0758) Pulse Rate: 113 (02/14 0921)  Labs: Recent Labs    11/29/18 0423 11/30/18 0540  HGB  --  13.0  HCT  --  40.4  PLT  --  224  CREATININE 1.05* 1.05*    Estimated Creatinine Clearance: 53.8 mL/min (A) (by C-G formula based on SCr of 1.05 mg/dL (H)).   Medical History: Past Medical History:  Diagnosis Date  . Arthritis    "probably; maybe in my hands" (09/21/2018)  . Congestive heart failure (CHF) ([place])   . Dyspnea   . Fibromyalgia   . GERD (gastroesophageal reflux disease)   . Hypercholesteremia   . Hypertension   . IBS (irritable bowel syndrome)   . Mild sleep apnea    no CPAP (09/21/2018)  . Myocardial infarction [facility])    "was told I have had one; never knew about it" (09/21/2018)  . Nonischemic cardiomyopathy (HCC)    ECHO EF 40-45%, Septal Thickness,and mild global left ventricular dysfunction; stress test 03/2004 +VE, refersible defect; ECHO 03/2005 @[facility]  EF 55%  . Pulmonary embolus (HCC)     Medications:  Infusions:  . milrinone 0.25 mcg/kg/min (12/01/18 3734)    Assessment: 63 yo female on chronic Xarelto for hx DVT/PE.  Pharmacy asked to switch to heparin in case mechanical support is needed.  CBC stable. No overt bleeding or complications noted.  Last dose of Xarelto was taken yesterday at 1043 AM.  Goal of Therapy:  APTT: 66-102 s Heparin level 0.3-0.7 units/ml Monitor platelets by anticoagulation protocol: Yes   Plan:  Start IV heparin at 950 units/hr (no bolus) Check heparin level 6 hrs after gtt starts. Daily heparin level and  CBC.  Antonietta Jewel, PharmD, [place] Clinical Pharmacist  Pager: [phone] Phone: [phone]  12/01/2018 9:53 AM

## 2018-12-01 NOTE — Progress Notes (Signed)
[place] for IV heparin Indication: Hx DVT/PE, holding Xarelto  Allergies  Allergen Reactions  . Poison Oak Extract Rash    Patient Measurements: Height: 5\' 2"  (157.5 cm) Weight: 176 lb 9.4 oz (80.1 kg) IBW/kg (Calculated) : 50.1 Heparin Dosing Weight: 68.3 kg  Vital Signs: Temp: 98 F (36.7 C) (02/14 1137) Temp Source: Oral (02/14 1137) BP: 98/74 (02/14 1137) Pulse Rate: 117 (02/14 1137)  Labs: Recent Labs    11/29/18 0423 11/30/18 0540 12/01/18 1005 12/01/18 1700  HGB  --  13.0 13.2  --   HCT  --  40.4 38.7  --   PLT  --  224 208  --   APTT  --   --  36 64*  HEPARINUNFRC  --   --  2.02* 1.92*  CREATININE 1.05* 1.05* 0.98  --     Estimated Creatinine Clearance: 57.6 mL/min (by C-G formula based on SCr of 0.98 mg/dL).   Medical History: Past Medical History:  Diagnosis Date  . Arthritis    "probably; maybe in my hands" (09/21/2018)  . Congestive heart failure (CHF) ([place])   . Dyspnea   . Fibromyalgia   . GERD (gastroesophageal reflux disease)   . Hypercholesteremia   . Hypertension   . IBS (irritable bowel syndrome)   . Mild sleep apnea    no CPAP (09/21/2018)  . Myocardial infarction [facility])    "was told I have had one; never knew about it" (09/21/2018)  . Nonischemic cardiomyopathy (HCC)    ECHO EF 40-45%, Septal Thickness,and mild global left ventricular dysfunction; stress test 03/2004 +VE, refersible defect; ECHO 03/2005 @[facility]  EF 55%  . Pulmonary embolus (HCC)     Medications:  Infusions:  . heparin 950 Units/hr (12/01/18 1153)  . milrinone 0.25 mcg/kg/min (12/01/18 6967)    Assessment: 63 yo female on chronic Xarelto for hx DVT/PE.  Pharmacy asked to switch to heparin in case mechanical support is needed.  CBC stable. No overt bleeding or complications noted.  Last dose of Xarelto was taken yesterday at 1043 AM. Heparin drip 950 uts/hr HL elevated from xarelto use wil use aptt to dose heparin Aptt 64sec  slightly lower than goal will increase slightly.     Goal of Therapy:  APTT: 66-102 sec Heparin level 0.3-0.7 units/ml Monitor platelets by anticoagulation protocol: Yes   Plan:  Increase heparin 1000 units/hr  Daily heparin level, aptt and CBC.  Bonnita Nasuti Pharm.D. CPP, BCPS Clinical Pharmacist [phone] 12/01/2018 7:44 PM

## 2018-12-01 NOTE — Care Management Important Message (Signed)
Important Message  Patient Details  Name: Jennifer [name] MRN: [id number] Date of Birth: 10-Jan-1956   Medicare Important Message Given:  Yes    Gennavieve Huq P [place] 12/01/2018, 11:39 AM

## 2018-12-01 NOTE — Progress Notes (Signed)
CARDIAC REHAB PHASE I   PRE:  Rate/Rhythm: 117 ST  BP:  Supine:   Sitting: 109/53  Standing:    SaO2: 96%RA  MODE:  Ambulation: 340 ft   POST:  Rate/Rhythm: 127 ST  BP:  Supine:   Sitting: 97/70  Standing:    SaO2: 96%RA 1030-1103 Pt glad to get up and walk. Walked 340 ft on RA after using BSC with asst x 1 and steady gait. Tolerated well. Able to talk with her friend and walk at same time. Checked HR in hallway and upper 120s. Back to bed after walk. Pt stated she drinks a lot of water. Discussed that we usually try to adhere to 2L FR. Will continue ed as we continue to see pt.    Graylon Good, RN BSN  12/01/2018 11:00 AM

## 2018-12-01 NOTE — Plan of Care (Signed)
  Problem: Education: Goal: Ability to demonstrate management of disease process will improve Outcome: Progressing Goal: Ability to verbalize understanding of medication therapies will improve Outcome: Progressing   Problem: Activity: Goal: Capacity to carry out activities will improve Outcome: Progressing   Problem: Education: Goal: Knowledge of General Education information will improve Description Including pain rating scale, medication(s)/side effects and non-pharmacologic comfort measures Outcome: Progressing   Problem: Elimination: Goal: Will not experience complications related to urinary retention Outcome: Progressing   Problem: Pain Managment: Goal: General experience of comfort will improve Outcome: Progressing   Problem: Safety: Goal: Ability to remain free from injury will improve Outcome: Progressing   Problem: Skin Integrity: Goal: Risk for impaired skin integrity will decrease Outcome: Progressing

## 2018-12-01 NOTE — Progress Notes (Addendum)
Advanced Heart Failure Rounding Note  PCP-Cardiologist: Kate Sable, MD   Subjective:   Events   2-13 Started milrinone 0.25 mcg for low CO-OX  (31%). Diuresed with IV lasix. I/O not accurate.   Todays CO-OX is 83%.    Feeling better today. Denies SOB. Able to move around the room without difficulty.    Objective:   Weight Range: 80.1 kg Body mass index is 32.3 kg/m.   Vital Signs:   Temp:  [97.8 F (36.6 C)-98.7 F (37.1 C)] 97.8 F (36.6 C) (02/14 0758) Pulse Rate:  [107-122] 107 (02/14 0758) Resp:  [10-23] 10 (02/13 2010) BP: (88-128)/(47-82) 88/47 (02/14 0758) SpO2:  [94 %-97 %] 94 % (02/14 0758) Weight:  [80.1 kg] 80.1 kg (02/14 0357) Last BM Date: 11/30/18  Weight change: Filed Weights   11/29/18 0534 11/30/18 0451 12/01/18 0357  Weight: 80.1 kg 80.4 kg 80.1 kg    Intake/Output:   Intake/Output Summary (Last 24 hours) at 12/01/2018 0824 Last data filed at 12/01/2018 0700 Gross per 24 hour  Intake 241 ml  Output 1900 ml  Net -1659 ml      Physical Exam   CVP 10-11 personally checked.  General:  Well appearing. No resp difficulty HEENT: Normal Neck: Supple. JVP 10-11 . Carotids 2+ bilat; no bruits. No lymphadenopathy or thyromegaly appreciated. Cor: PMI nondisplaced. Regular rate & rhythm. No rubs, gallops . 2/6 LSB Lungs: Clear on room air.  Abdomen: Soft, nontender, nondistended. No hepatosplenomegaly. No bruits or masses. Good bowel sounds. Extremities: No cyanosis, clubbing, rash, edema. RUE PICC  Neuro: Alert & orientedx3, cranial nerves grossly intact. moves all 4 extremities w/o difficulty. Affect pleasant   Telemetry  Sinus Tach 100s    EKG    n/a  Labs    CBC Recent Labs    11/30/18 0540  WBC 8.1  HGB 13.0  HCT 40.4  MCV 98.8  PLT 549   Basic Metabolic Panel Recent Labs    11/29/18 0423 11/30/18 0540  NA 141 139  K 3.6 3.6  CL 104 104  CO2 27 25  GLUCOSE 105* 103*  BUN 27* 28*  CREATININE 1.05* 1.05*    CALCIUM 8.9 8.7*  MG  --  2.0   Liver Function Tests No results for input(s): AST, ALT, ALKPHOS, BILITOT, PROT, ALBUMIN in the last 72 hours. No results for input(s): LIPASE, AMYLASE in the last 72 hours. Cardiac Enzymes No results for input(s): CKTOTAL, CKMB, CKMBINDEX, TROPONINI in the last 72 hours.  BNP: BNP (last 3 results) Recent Labs    11/27/18 2041  BNP 1,311.1*    ProBNP (last 3 results) No results for input(s): PROBNP in the last 8760 hours.   D-Dimer No results for input(s): DDIMER in the last 72 hours. Hemoglobin A1C No results for input(s): HGBA1C in the last 72 hours. Fasting Lipid Panel No results for input(s): CHOL, HDL, LDLCALC, TRIG, CHOLHDL, LDLDIRECT in the last 72 hours. Thyroid Function Tests No results for input(s): TSH, T4TOTAL, T3FREE, THYROIDAB in the last 72 hours.  Invalid input(s): FREET3  Other results:   Imaging    [place] Ekg Site Rite  Result Date: 11/30/2018 If Site Rite image not attached, placement could not be confirmed due to current cardiac rhythm.     Medications:     Scheduled Medications: . aspirin  81 mg Oral QPM  . atorvastatin  80 mg Oral q1800  . cholecalciferol  1,000 Units Oral Daily  . digoxin  0.125 mg Oral  Daily  . DULoxetine  60 mg Oral BID  . furosemide  60 mg Intravenous BID  . gabapentin  600 mg Oral TID  . potassium chloride  40 mEq Oral Daily  . pramipexole  0.5 mg Oral QHS  . sodium chloride flush  10-40 mL Intracatheter Q12H  . spironolactone  12.5 mg Oral Daily  . vitamin C  500 mg Oral Daily     Infusions: . milrinone 0.25 mcg/kg/min (11/30/18 2208)     PRN Medications:  acetaminophen **OR** acetaminophen, sodium chloride flush    Patient Profile   Jennifer [name] is a 63 y.o. female with a history of CAD s/p CABG 09/2018, longstanding chronic systolic HF due to NICM 29+ years ago, HTN, HL, and fibromyalgia. Recent diagnosis (1/20) of bilateral PE on xarelto.   Admitted with  worsening SOB.   Assessment/Plan  1. A/C systolic HF with new RV failure. Prior hx of NICM x 15+ years, now with ICM with CABG 09/2018. New RV failure secondary to acute bilateral PE. - Echo 11/28/18: EF 10-15%, RV moderately reduced, severe functional MR -CO-OX much improved on milrinone 0.25 mcg. Looks much better today - CVP coming down to 10-11. Continue IV lasix for now. Continue 12.5 mg spiro daily. Renal function stable.  - Stop BB with acute RV failure. - Continue  digoxin 0.125 mg daily - BP soft. Hold off on arb - She will likely need RHC.  -BMET pending.   2. Bilateral PE - Diagnosed at [facility] 10/2018 - Xarelto stopped and heparin starting today in the event she needs supportive devices.     3. CAD s/p CABG 09/2018 - LIMA to the LAD and an SVG to the OM on 09/22/18 with Dr Roxan Hockey - No s/s ischemia - Continue 81 mg asa.  - Continue statin - No bb with rv failure  4. Severe MR - New diagnosis on echo this admit. Echo at [facility] showed mod to severe MR 10/2018. Mild MR on TEE 09/22/18.  5. HTN -> hypotension - Hypotensive with SBP 90s.  6. SVT -Sinus Tach today.   7. ?OSA - Will need repeat sleep study outpatient.  Consult cardiac rehab.   Medication concerns reviewed with patient and pharmacy team. Barriers identified: none   Length of Stay: 3  Amy Clegg, NP  12/01/2018, 8:24 AM  Advanced Heart Failure Team Pager [phone] (M-F; 7a - 4p)  Please contact [facility] for night-coverage after hours (4p -7a ) and weekends on [email or web address]  Agree with above.   Milrinone started last night for co-ox 31%. CVP 20. Responding well to milrinone with good diuresis. Co-ox 70% CVP down to 12. However remains tachycardic with HRs 120-130.   Sitting in bed. NAD JVP to jaw Cor Tachy regular + s3 3/6 MR Lungs decreased at bases Ab obese NT/ND Ext 1+ edema + PICC  She remains very tenuous. Co-ox improved on milrinone but very tachycardic. I  suspect this is mostly NICM as EF decreased far out of proportion to CAD. Will continue milrinone and diuresis. Titrate HF meds. Long discussion with her and her daughter tonight about possible need for advanced therapies in near future. Was on Xarelto for DVT/PE but switched to heparin in case she needs mechanical support in near future. RV is moderately HK but may tolerate VAD if needed. Check blood type.   CRITICAL CARE Performed by: Glori Bickers  Total critical care time: 35 minutes  Critical care time was exclusive of separately billable  procedures and treating other patients.  Critical care was necessary to treat or prevent imminent or life-threatening deterioration.  Critical care was time spent personally by me (independent of midlevel providers or residents) on the following activities: development of treatment plan with patient and/or surrogate as well as nursing, discussions with consultants, evaluation of patient's response to treatment, examination of patient, obtaining history from patient or surrogate, ordering and performing treatments and interventions, ordering and review of laboratory studies, ordering and review of radiographic studies, pulse oximetry and re-evaluation of patient's condition.  Glori Bickers, MD  7:25 PM

## 2018-12-02 LAB — CBC
HCT: 37.8 % (ref 36.0–46.0)
HEMOGLOBIN: 12.2 g/dL (ref 12.0–15.0)
MCH: 32 pg (ref 26.0–34.0)
MCHC: 32.3 g/dL (ref 30.0–36.0)
MCV: 99.2 fL (ref 80.0–100.0)
Platelets: 177 10*3/uL (ref 150–400)
RBC: 3.81 MIL/uL — ABNORMAL LOW (ref 3.87–5.11)
RDW: 13.1 % (ref 11.5–15.5)
WBC: 7.4 10*3/uL (ref 4.0–10.5)
nRBC: 0 % (ref 0.0–0.2)

## 2018-12-02 LAB — BASIC METABOLIC PANEL
Anion gap: 12 (ref 5–15)
BUN: 20 mg/dL (ref 8–23)
CO2: 25 mmol/L (ref 22–32)
CREATININE: 0.94 mg/dL (ref 0.44–1.00)
Calcium: 7.8 mg/dL — ABNORMAL LOW (ref 8.9–10.3)
Chloride: 102 mmol/L (ref 98–111)
GFR calc Af Amer: >60 mL/min (ref >60)
GFR calc non Af Amer: >60 mL/min (ref >60)
Glucose, Bld: 98 mg/dL (ref 70–99)
Potassium: 2.9 mmol/L — ABNORMAL LOW (ref 3.5–5.1)
Sodium: 139 mmol/L (ref 135–145)

## 2018-12-02 LAB — APTT
APTT: 35 s (ref 24–36)
aPTT: 37 seconds — ABNORMAL HIGH (ref 24–36)

## 2018-12-02 LAB — COOXEMETRY PANEL
Carboxyhemoglobin: 1.5 % (ref 0.5–1.5)
Methemoglobin: 1.5 % (ref 0.0–1.5)
O2 Saturation: 63.6 %
Total hemoglobin: 13.7 g/dL (ref 12.0–16.0)

## 2018-12-02 LAB — HEPARIN LEVEL (UNFRACTIONATED): HEPARIN UNFRACTIONATED: 0.94 [IU]/mL — AB (ref 0.30–0.70)

## 2018-12-02 LAB — TYPE AND SCREEN
ABO/RH(D): A POS
Antibody Screen: NEGATIVE

## 2018-12-02 LAB — MRSA PCR SCREENING: MRSA by PCR: NEGATIVE

## 2018-12-02 MED ORDER — LOSARTAN POTASSIUM 25 MG PO TABS
12.5000 mg | ORAL_TABLET | Freq: Two times a day (BID) | ORAL | Status: DC
  Administered 2018-12-02 – 2018-12-07 (×10): 12.5 mg via ORAL
  Filled 2018-12-02 (×11): qty 1

## 2018-12-02 MED ORDER — POTASSIUM CHLORIDE CRYS ER 20 MEQ PO TBCR
40.0000 meq | EXTENDED_RELEASE_TABLET | ORAL | Status: AC
  Administered 2018-12-02 (×2): 40 meq via ORAL
  Filled 2018-12-02 (×2): qty 2

## 2018-12-02 MED ORDER — IVABRADINE HCL 5 MG PO TABS
2.5000 mg | ORAL_TABLET | Freq: Two times a day (BID) | ORAL | Status: DC
  Administered 2018-12-02 – 2018-12-03 (×2): 2.5 mg via ORAL
  Filled 2018-12-02 (×2): qty 1

## 2018-12-02 MED ORDER — SPIRONOLACTONE 25 MG PO TABS
25.0000 mg | ORAL_TABLET | Freq: Every day | ORAL | Status: DC
  Administered 2018-12-03 – 2018-12-07 (×5): 25 mg via ORAL
  Filled 2018-12-02 (×5): qty 1

## 2018-12-02 MED ORDER — POTASSIUM CHLORIDE CRYS ER 20 MEQ PO TBCR
30.0000 meq | EXTENDED_RELEASE_TABLET | Freq: Two times a day (BID) | ORAL | Status: DC
  Administered 2018-12-02 – 2018-12-03 (×2): 30 meq via ORAL
  Filled 2018-12-02 (×2): qty 1

## 2018-12-02 NOTE — Progress Notes (Addendum)
Advanced Heart Failure Rounding Note  PCP-Cardiologist: Kate Sable, MD   Subjective:   Events   2-13 Started milrinone 0.25 mcg for low CO-OX  (31%).  Remains on milrinone 0.25 and IV lasix. Co-ox 64%. Feels much better. Denies SOB, orthopnea or PND. Weight down 1-2 pounds. K 2.9. On heparin. Remains tachycardic with HRs in 110-120s (sinus tach). CVP 6  Echo EF 10-15% with moderate RV dysfunction.    Objective:   Weight Range: 79.4 kg Body mass index is 32.02 kg/m.   Vital Signs:   Temp:  [98 F (36.7 C)-98.9 F (37.2 C)] 98.9 F (37.2 C) (02/15 0743) Pulse Rate:  [110-119] 119 (02/15 0944) Resp:  [19-25] 20 (02/15 0743) BP: (93-107)/(55-75) 105/67 (02/15 0743) SpO2:  [90 %-98 %] 95 % (02/15 0743) Weight:  [79.4 kg] 79.4 kg (02/15 0348) Last BM Date: 12/01/18  Weight change: Filed Weights   11/30/18 0451 12/01/18 0357 12/02/18 0348  Weight: 80.4 kg 80.1 kg 79.4 kg    Intake/Output:   Intake/Output Summary (Last 24 hours) at 12/02/2018 0948 Last data filed at 12/02/2018 0300 Gross per 24 hour  Intake 250 ml  Output 2450 ml  Net -2200 ml      Physical Exam    General:  Lying in bed. No resp difficulty HEENT: normal Neck: supple. no JVP 6. Carotids 2+ bilat; no bruits. No lymphadenopathy or thryomegaly appreciated. Cor: PMI laterally displaced. Tachy regular +s3. 2/6 MR Lungs: clear Abdomen: soft, nontender, nondistended. No hepatosplenomegaly. No bruits or masses. Good bowel sounds. Extremities: no cyanosis, clubbing, rash, edema + RUE PICC Neuro: alert & orientedx3, cranial nerves grossly intact. moves all 4 extremities w/o difficulty. Affect pleasant   Telemetry  Sinus Tach 110-120 Personally reviewed  EKG    n/a  Labs    CBC Recent Labs    12/01/18 1005 12/02/18 0403  WBC 8.5 7.4  HGB 13.2 12.2  HCT 38.7 37.8  MCV 97.2 99.2  PLT 208 626   Basic Metabolic Panel Recent Labs    11/30/18 0540 12/01/18 1005 12/02/18 0403  NA  139 137 139  K 3.6 3.5 2.9*  CL 104 99 102  CO2 25 27 25   GLUCOSE 103* 169* 98  BUN 28* 24* 20  CREATININE 1.05* 0.98 0.94  CALCIUM 8.7* 9.0 7.8*  MG 2.0  --   --    Liver Function Tests No results for input(s): AST, ALT, ALKPHOS, BILITOT, PROT, ALBUMIN in the last 72 hours. No results for input(s): LIPASE, AMYLASE in the last 72 hours. Cardiac Enzymes No results for input(s): CKTOTAL, CKMB, CKMBINDEX, TROPONINI in the last 72 hours.  BNP: BNP (last 3 results) Recent Labs    11/27/18 2041  BNP 1,311.1*    ProBNP (last 3 results) No results for input(s): PROBNP in the last 8760 hours.   D-Dimer No results for input(s): DDIMER in the last 72 hours. Hemoglobin A1C No results for input(s): HGBA1C in the last 72 hours. Fasting Lipid Panel No results for input(s): CHOL, HDL, LDLCALC, TRIG, CHOLHDL, LDLDIRECT in the last 72 hours. Thyroid Function Tests No results for input(s): TSH, T4TOTAL, T3FREE, THYROIDAB in the last 72 hours.  Invalid input(s): FREET3  Other results:   Imaging    No results found.   Medications:     Scheduled Medications: . aspirin  81 mg Oral QPM  . atorvastatin  80 mg Oral q1800  . cholecalciferol  1,000 Units Oral Daily  . digoxin  0.125 mg Oral  Daily  . DULoxetine  60 mg Oral BID  . furosemide  60 mg Intravenous BID  . gabapentin  600 mg Oral TID  . potassium chloride  30 mEq Oral BID  . potassium chloride  40 mEq Oral Q4H  . pramipexole  0.5 mg Oral QHS  . sodium chloride flush  10-40 mL Intracatheter Q12H  . spironolactone  12.5 mg Oral Daily  . vitamin C  500 mg Oral Daily    Infusions: . heparin 1,200 Units/hr (12/02/18 0544)  . milrinone 0.25 mcg/kg/min (12/02/18 0015)    PRN Medications: acetaminophen **OR** acetaminophen, sodium chloride flush    Patient Profile   Jennifer [name] is a 63 y.o. female with a history of CAD s/p CABG 09/2018, longstanding chronic systolic HF due to NICM 57+ years ago, HTN, HL, and  fibromyalgia. Recent diagnosis (1/20) of bilateral PE on xarelto.   Admitted with worsening SOB.   Assessment/Plan   1. A/C systolic HF with new RV failure. Prior hx of NICM x 15+ years, now with ICM with CABG 09/2018. New RV failure secondary to acute bilateral PE. - Echo 11/28/18: EF 10-15%, RV moderately reduced, severe functional MR - CO-OX much improved on milrinone 0.25 mcg. CVP down to 6. Co-ox 64% - Continue milrinone at current dose for now until oral therapy increased - Will stop IV lasix today. Start po tomorrow - Continue  digoxin 0.125 mg daily - Add low-dose losartan 12.5 and corlanor 2.5  - Increase spiro to 25 - No b-blocker yet with shock - She will likely need RHC.  - Overall still tenuous. Co-ox improved on milrinone but very tachycardic. I suspect this is mostly NICM as EF decreased far out of proportion to CAD. Will continue milrinone for now. Titrate HF meds. Long discussion with her and her daughter on 2/14 about possible need for advanced therapies in near future. Was on Xarelto for DVT/PE but switched to heparin in case she needs mechanical support in near future. RV is moderately HK but may tolerate VAD if needed. Blood type A pos   2. Bilateral PE - Diagnosed at [facility] 10/2018 - Xarelto stopped and heparin starting today in the event she needs supportive devices.    - Discussed dosing with PharmD personally.   3. CAD s/p CABG 09/2018 - LIMA to the LAD and an SVG to the OM on 09/22/18 with Dr Roxan Hockey - No s/s ischemia - Continue 81 mg asa.  - Continue statin - No bb with rv failure  4. Severe MR - New diagnosis on echo this admit. Echo at [facility] showed mod to severe MR 10/2018. Mild MR on TEE 09/22/18.  5. Hypokalemia - Will supp  6. Tachycardia -Appears sinus. Will repeat ECG to confirm  7. ?OSA - Will need repeat sleep study outpatient.  Consult cardiac rehab.     Length of Stay: [place], MD  12/02/2018,  9:48 AM  Advanced Heart Failure Team Pager [phone] (M-F; 7a - 4p)  Please contact [facility] for night-coverage after hours (4p -7a ) and weekends on [email or web address]

## 2018-12-02 NOTE — Progress Notes (Signed)
[place] for IV heparin Indication: Hx DVT/PE, holding Xarelto  Patient Measurements: Height: 5\' 2"  (157.5 cm) Weight: 175 lb 0.7 oz (79.4 kg) IBW/kg (Calculated) : 50.1 Heparin Dosing Weight: 68.3 kg  Vital Signs: Temp: 97.9 F (36.6 C) (02/15 1115) Temp Source: Oral (02/15 1115) BP: 102/67 (02/15 1115) Pulse Rate: 117 (02/15 1115)  Labs: Recent Labs    11/30/18 0540  12/01/18 1005 12/01/18 1700 12/02/18 0403 12/02/18 1413  HGB 13.0  --  13.2  --  12.2  --   HCT 40.4  --  38.7  --  37.8  --   PLT 224  --  208  --  177  --   APTT  --    < > 36 64* 35 37*  HEPARINUNFRC  --   --  2.02* 1.92* 0.94*  --   CREATININE 1.05*  --  0.98  --  0.94  --    < > = values in this interval not displayed.    Estimated Creatinine Clearance: 59.8 mL/min (by C-G formula based on SCr of 0.94 mg/dL).  Medications:  Infusions:  . heparin 1,200 Units/hr (12/02/18 0544)  . milrinone 0.25 mcg/kg/min (12/02/18 0015)    Assessment: 63 yo female on chronic Xarelto for hx DVT/PE.  Pharmacy asked to switch to heparin in case mechanical support is needed.  CBC stable. No overt bleeding or complications noted. Heparin level remains elevated but aPTT is still low at 37s. No bleeding issues noted.   Goal of Therapy:  APTT: 66-102 sec Heparin level 0.3-0.7 units/ml Monitor platelets by anticoagulation protocol: Yes   Plan:  Increase heparin gtt to 1450 units/hr Check an 8 hr aPTT  Erin Hearing PharmD., BCPS Clinical Pharmacist 12/02/2018 3:15 PM

## 2018-12-02 NOTE — Progress Notes (Signed)
PROGRESS NOTE                                                                                                                                                                                                             Patient Demographics:    Jennifer [name], is a 63 y.o. female, DOB - December 31, 1955, NOI:[id number]  Admit date - 11/27/2018   Admitting Physician Shela Leff, MD  Outpatient Primary MD for the patient is Monico Blitz, MD  LOS - 4   Chief Complaint  Patient presents with  . Shortness of Breath       Brief Narrative   63 year old female with history of CAD, status post CABG in December 2019, ischemic cardiomyopathy, EF of 25%, hypertension, dyslipidemia, IBS was just diagnosed with pulmonary embolism 2 to 3 weeks ago at [facility] 1/20, started [place], presented to the ED from cardiology clinic secondary to dyspnea, her work-up significant for a drop in her EF 10 to 15%, as well significant for new mitral regurgitation was seen by CHF team.  PICC line inserted, started on milrinone drip.    Subjective:    Jennifer [name] today reports he is feeling better, dyspnea has improved, no cough, no chest pain.   Assessment  & Plan :    Principal Problem:   Exertional dyspnea Active Problems:   HTN (hypertension)   Dyslipidemia, goal LDL below 70   Pulmonary embolism (HCC)   AKI (acute kidney injury) (HCC)   Shortness of breath  Acute on chronic systolic CHF RV systolic dysfunction -Multifactorial, in the setting of acute on chronic systolic CHF, with significant drop in EF to 10 to 15%, severe mitral regurgitation, and recent diagnosis of PE . -He has significantly improved with current management CHF team, she did well with cardiac rehab today . -Significantly low EF 10 to 15%, white symptomatic on presentation, on IV Lasix, diuresing well, with stable renal function, on milrinone drip which is managed by cardiology , CV P is down to  6 today, her co-ox has improved as well. -No beta-blockers in the setting of acute right RV failure -Blood pressure has improved, started on low-dose losartan, Aldactone was increased today. -CHF team considering mechanical support in the near future.-May need right heart cath per CHF team -On heparin GTT instead of Xarelto for possible need for procedures.  Severe mitral regurgitation -Management per cardiology, new diagnosis on echo this admission,  CAD/CABG -Denies any chest pain today, continue with aspirin and statin, beta-blockers on hold, started on low-dose losartan -LIMA to the LAD and an SVG to the OM on 12/6/19with Dr Roxan Hockey  History of recent bilateral PE -Diagnosed in [facility] last month, she is on Xarelto, currently on heparin GTT for possible need for procedures.  Hypokalemia -On IV diuresis, repleted, recheck BMP in a.m.  Depression -Continue duloxetine  Dyslipidemia -Continue statin  Sinus tachycardia -Rate in the 110s to 120s on telemetry, possibly related to nonischemic cardiomyopathy, as well rebound tachycardia from holding her home beta-blockers   Code Status : Full  Family Communication  : none at bedside  Disposition Plan  : home when stable  Barriers For Discharge : remains on IV lasix, milrinone drip  Consults  :  Cardiology/CHF  Procedures  : None  DVT Prophylaxis  :  Xarelto>> heparin GTT  Lab Results  Component Value Date   PLT 177 12/02/2018    Antibiotics  :    Anti-infectives (From admission, onward)   None        Objective:   Vitals:   12/02/18 0348 12/02/18 0743 12/02/18 0944 12/02/18 1115  BP: (!) 97/55 105/67  102/67  Pulse: (!) 110 (!) 113 (!) 119 (!) 117  Resp: 20 20  (!) 22  Temp: 98 F (36.7 C) 98.9 F (37.2 C)  97.9 F (36.6 C)  TempSrc: Oral Oral  Oral  SpO2: 90% 95%  96%  Weight: 79.4 kg     Height:        Wt Readings from Last 3 Encounters:  12/02/18 79.4 kg  11/27/18 81.8 kg    11/08/18 79.8 kg     Intake/Output Summary (Last 24 hours) at 12/02/2018 1400 Last data filed at 12/02/2018 1118 Gross per 24 hour  Intake 652 ml  Output 3700 ml  Net -3048 ml     Physical Exam  Awake Alert, Oriented X 3, No new F.N deficits, Normal affect Symmetrical Chest wall movement, Good air movement bilaterally, CTAB Tachycardic,No Gallops,Rubs or new Murmurs, No Parasternal Heave +ve B.Sounds, Abd Soft, No tenderness, No rebound - guarding or rigidity. No Cyanosis, Clubbing or edema, No new Rash or bruise        Data Review:    CBC Recent Labs  Lab 11/27/18 2100 11/30/18 0540 12/01/18 1005 12/02/18 0403  WBC 9.0 8.1 8.5 7.4  HGB 12.9 13.0 13.2 12.2  HCT 39.7 40.4 38.7 37.8  PLT 233 224 208 177  MCV 97.5 98.8 97.2 99.2  MCH 31.7 31.8 33.2 32.0  MCHC 32.5 32.2 34.1 32.3  RDW 12.6 13.1 13.1 13.1  LYMPHSABS 3.4  --   --   --   MONOABS 0.6  --   --   --   EOSABS 0.3  --   --   --   BASOSABS 0.1  --   --   --     Chemistries  Recent Labs  Lab 11/27/18 2100 11/28/18 0138 11/29/18 0423 11/30/18 0540 12/01/18 1005 12/02/18 0403  NA 138 138 141 139 137 139  K 3.5 3.6 3.6 3.6 3.5 2.9*  CL 104 106 104 104 99 102  CO2 22 21* 27 25 27 25   GLUCOSE 100* 119* 105* 103* 169* 98  BUN 30* 30* 27* 28* 24* 20  CREATININE 1.05* 0.95 1.05* 1.05* 0.98 0.94  CALCIUM 9.0 8.8* 8.9 8.7* 9.0 7.8*  MG  --   --   --  2.0  --   --   AST 49*  --   --   --   --   --   ALT 71*  --   --   --   --   --   ALKPHOS 112  --   --   --   --   --   BILITOT 0.9  --   --   --   --   --    ------------------------------------------------------------------------------------------------------------------ No results for input(s): CHOL, HDL, LDLCALC, TRIG, CHOLHDL, LDLDIRECT in the last 72 hours.  Lab Results  Component Value Date   HGBA1C 5.0 09/22/2018   ------------------------------------------------------------------------------------------------------------------ No results  for input(s): TSH, T4TOTAL, T3FREE, THYROIDAB in the last 72 hours.  Invalid input(s): FREET3 ------------------------------------------------------------------------------------------------------------------ No results for input(s): VITAMINB12, FOLATE, FERRITIN, TIBC, IRON, RETICCTPCT in the last 72 hours.  Coagulation profile No results for input(s): INR, PROTIME in the last 168 hours.  No results for input(s): DDIMER in the last 72 hours.  Cardiac Enzymes Recent Labs  Lab 11/28/18 0138 11/28/18 0715  TROPONINI 0.07* 0.06*   ------------------------------------------------------------------------------------------------------------------    Component Value Date/Time   BNP 1,311.1 (H) 11/27/2018 2041    Inpatient Medications  Scheduled Meds: . aspirin  81 mg Oral QPM  . atorvastatin  80 mg Oral q1800  . cholecalciferol  1,000 Units Oral Daily  . digoxin  0.125 mg Oral Daily  . DULoxetine  60 mg Oral BID  . gabapentin  600 mg Oral TID  . ivabradine  2.5 mg Oral BID WC  . losartan  12.5 mg Oral BID  . potassium chloride  30 mEq Oral BID  . pramipexole  0.5 mg Oral QHS  . sodium chloride flush  10-40 mL Intracatheter Q12H  . [START ON 12/03/2018] spironolactone  25 mg Oral Daily  . vitamin C  500 mg Oral Daily   Continuous Infusions: . heparin 1,200 Units/hr (12/02/18 0544)  . milrinone 0.25 mcg/kg/min (12/02/18 0015)   PRN Meds:.acetaminophen **OR** acetaminophen, sodium chloride flush  Micro Results No results found for this or any previous visit (from the past 240 hour(s)).  Radiology Reports Dg Chest 2 View  Result Date: 11/27/2018 CLINICAL DATA:  Shortness of breath for 4-5 days. EXAM: CHEST - 2 VIEW COMPARISON:  Single-view of the chest and CT chest 11/05/2018. FINDINGS: The patient is status post CABG. There is cardiomegaly. Small right pleural effusion is noted. No left effusion. Lungs are clear. No pneumothorax. No acute bony abnormality. IMPRESSION:  Cardiomegaly without edema. Small right pleural effusion. Electronically Signed   By: Inge Rise M.D.   On: 11/27/2018 18:20   [place] Ekg Site Rite  Result Date: 11/30/2018 If Site Rite image not attached, placement could not be confirmed due to current cardiac rhythm.    Phillips Climes M.D on 12/02/2018 at 2:00 PM  Between 7am to 7pm - Pager - [phone]  After 7pm go to [email or web address] - password [facility]  [facility] -  Office  [phone]

## 2018-12-02 NOTE — Progress Notes (Signed)
Jennifer [name] for IV heparin Indication: Hx DVT/PE, holding Xarelto  Patient Measurements: Height: 5\' 2"  (157.5 cm) Weight: 175 lb 0.7 oz (79.4 kg) IBW/kg (Calculated) : 50.1 Heparin Dosing Weight: 68.3 kg  Vital Signs: Temp: 98 F (36.7 C) (02/15 0348) Temp Source: Oral (02/15 0348) BP: 97/55 (02/15 0348) Pulse Rate: 110 (02/15 0348)  Labs: Recent Labs    11/30/18 0540 12/01/18 1005 12/01/18 1700 12/02/18 0403  HGB 13.0 13.2  --  12.2  HCT 40.4 38.7  --  37.8  PLT 224 208  --  177  APTT  --  36 64* 35  HEPARINUNFRC  --  2.02* 1.92* 0.94*  CREATININE 1.05* 0.98  --  0.94    Estimated Creatinine Clearance: 59.8 mL/min (by C-G formula based on SCr of 0.94 mg/dL).  Medications:  Infusions:  . heparin 1,000 Units/hr (12/01/18 2025)  . milrinone 0.25 mcg/kg/min (12/02/18 0015)    Assessment: 63 yo female on chronic Xarelto for hx DVT/PE.  Pharmacy asked to switch to heparin in case mechanical support is needed.  CBC stable. No overt bleeding or complications noted. Heparin level remains elevated but aPTT is below goal.   Goal of Therapy:  APTT: 66-102 sec Heparin level 0.3-0.7 units/ml Monitor platelets by anticoagulation protocol: Yes   Plan:  Increase heparin gtt to 1200 units/hr Check an 8 hr aPTT  Salome Arnt, PharmD, BCPS Please see AMION for all pharmacy numbers 12/02/2018 5:19 AM

## 2018-12-02 NOTE — Progress Notes (Signed)
Patient results of potassium levels this am 2.9. Paged [facility] for orders.

## 2018-12-02 NOTE — Progress Notes (Signed)
CARDIAC REHAB PHASE I   PRE:  Rate/Rhythm: 111 ST  BP:  Supine:   Sitting: 102/70  Standing:    SaO2: 94 RA  MODE:  Ambulation: 680 ft   POST:  Rate/Rhythm: 129 ST  BP:  Supine:   Sitting: 107/62  Standing:    SaO2: 98 RA 1150-1210 Assisted X 1 to ambulate. Gait steady. Pt able to walk 680 feet without c/o of SOB. Pt states that it feels good to be able to walk and not be short of breath. VS stable. Pt to recliner after walk with call light in reach. Left pt CHF education packet and encouraged her to watch heart failure  On TV.  Rodney Langton RN 12/02/2018 12:07 PM

## 2018-12-03 DIAGNOSIS — I5043 Acute on chronic combined systolic (congestive) and diastolic (congestive) heart failure: Secondary | ICD-10-CM

## 2018-12-03 LAB — APTT
aPTT: 111 seconds — ABNORMAL HIGH (ref 24–36)
aPTT: 95 seconds — ABNORMAL HIGH (ref 24–36)
aPTT: 98 seconds — ABNORMAL HIGH (ref 24–36)

## 2018-12-03 LAB — CBC
HCT: 40.8 % (ref 36.0–46.0)
Hemoglobin: 13.1 g/dL (ref 12.0–15.0)
MCH: 32.3 pg (ref 26.0–34.0)
MCHC: 32.1 g/dL (ref 30.0–36.0)
MCV: 100.7 fL — ABNORMAL HIGH (ref 80.0–100.0)
PLATELETS: 148 10*3/uL — AB (ref 150–400)
RBC: 4.05 MIL/uL (ref 3.87–5.11)
RDW: 13.5 % (ref 11.5–15.5)
WBC: 7.2 10*3/uL (ref 4.0–10.5)
nRBC: 0 % (ref 0.0–0.2)

## 2018-12-03 LAB — MAGNESIUM: MAGNESIUM: 2.3 mg/dL (ref 1.7–2.4)

## 2018-12-03 LAB — COOXEMETRY PANEL
Carboxyhemoglobin: 1.2 % (ref 0.5–1.5)
Carboxyhemoglobin: 1.3 % (ref 0.5–1.5)
Methemoglobin: 1.5 % (ref 0.0–1.5)
Methemoglobin: 1.4 % (ref 0.0–1.5)
O2 Saturation: 50.7 %
O2 Saturation: 55.3 %
Total hemoglobin: 13.6 g/dL (ref 12.0–16.0)
Total hemoglobin: 13.2 g/dL (ref 12.0–16.0)

## 2018-12-03 LAB — BASIC METABOLIC PANEL
Anion gap: 6 (ref 5–15)
BUN: 19 mg/dL (ref 8–23)
CO2: 29 mmol/L (ref 22–32)
Calcium: 8.8 mg/dL — ABNORMAL LOW (ref 8.9–10.3)
Chloride: 103 mmol/L (ref 98–111)
Creatinine, Ser: 0.76 mg/dL (ref 0.44–1.00)
GFR calc Af Amer: >60 mL/min (ref >60)
GFR calc non Af Amer: >60 mL/min (ref >60)
GLUCOSE: 112 mg/dL — AB (ref 70–99)
Potassium: 4.4 mmol/L (ref 3.5–5.1)
Sodium: 138 mmol/L (ref 135–145)

## 2018-12-03 LAB — HEPARIN LEVEL (UNFRACTIONATED): Heparin Unfractionated: 0.86 IU/mL — ABNORMAL HIGH (ref 0.30–0.70)

## 2018-12-03 MED ORDER — FUROSEMIDE 10 MG/ML IJ SOLN
60.0000 mg | Freq: Two times a day (BID) | INTRAMUSCULAR | Status: DC
  Administered 2018-12-03: 60 mg via INTRAVENOUS
  Filled 2018-12-03 (×2): qty 6

## 2018-12-03 MED ORDER — POTASSIUM CHLORIDE CRYS ER 20 MEQ PO TBCR: 30.0000 meq | EXTENDED_RELEASE_TABLET | Freq: Every day | ORAL | Status: DC

## 2018-12-03 MED ORDER — IVABRADINE HCL 5 MG PO TABS
5.0000 mg | ORAL_TABLET | Freq: Two times a day (BID) | ORAL | Status: DC
  Administered 2018-12-03 – 2018-12-06 (×6): 5 mg via ORAL
  Filled 2018-12-03 (×6): qty 1

## 2018-12-03 NOTE — Progress Notes (Addendum)
ANTICOAGULATION CONSULT NOTE  Pharmacy Consult for IV heparin Indication: Hx DVT/PE, holding Xarelto  Patient Measurements: Height: 5\' 2"  (157.5 cm) Weight: 175 lb 0.7 oz (79.4 kg) IBW/kg (Calculated) : 50.1 Heparin Dosing Weight: 68.3 kg  Vital Signs: Temp: 98.5 F (36.9 C) (02/15 2318) Temp Source: Oral (02/15 2318) BP: 97/46 (02/15 2318) Pulse Rate: 111 (02/15 2318)  Labs: Recent Labs    11/30/18 0540  12/01/18 1005 12/01/18 1700 12/02/18 0403 12/02/18 1413 12/02/18 2350  HGB 13.0  --  13.2  --  12.2  --   --   HCT 40.4  --  38.7  --  37.8  --   --   PLT 224  --  208  --  177  --   --   APTT  --    < > 36 64* 35 37* 98*  HEPARINUNFRC  --   --  2.02* 1.92* 0.94*  --   --   CREATININE 1.05*  --  0.98  --  0.94  --   --    < > = values in this interval not displayed.    Estimated Creatinine Clearance: 59.8 mL/min (by C-G formula based on SCr of 0.94 mg/dL).  Medications:  Infusions:  . heparin 1,450 Units/hr (12/02/18 1528)  . milrinone 0.25 mcg/kg/min (12/02/18 1718)    Assessment: 63 yo female on chronic Xarelto for hx DVT/PE.  Pharmacy asked to switch to heparin in case mechanical support is needed.  CBC stable. No overt bleeding or complications noted. APTT is now therapeutic. No bleeding noted.   Goal of Therapy:  APTT: 66-102 sec Heparin level 0.3-0.7 units/ml Monitor platelets by anticoagulation protocol: Yes   Plan:  Continue heparin gtt at 1450 units/hr Daily heparin level, aPTT and CBC  Salome Arnt, PharmD, BCPS Please see AMION for all pharmacy numbers 12/03/2018 12:19 AM  Addendum: Heparin level and aPTT are now slightly elevated. Reduce heparin gtt to 1350 units/hr, check an 8 hr heparin level and aPTT  Salome Arnt, PharmD, BCPS Please see AMION for all pharmacy numbers 12/03/2018 5:27 AM

## 2018-12-03 NOTE — Progress Notes (Signed)
[place] for IV heparin Indication: Hx DVT/PE, holding Xarelto  Patient Measurements: Height: 5\' 2"  (157.5 cm) Weight: 176 lb 2.4 oz (79.9 kg) IBW/kg (Calculated) : 50.1 Heparin Dosing Weight: 68.3 kg  Vital Signs: Temp: 98.8 F (37.1 C) (02/16 0413) Temp Source: Oral (02/16 0413) BP: 93/61 (02/16 0413) Pulse Rate: 109 (02/16 0413)  Labs: Recent Labs    12/01/18 1005 12/01/18 1700 12/02/18 0403 12/02/18 1413 12/02/18 2350 12/03/18 0440 12/03/18 0441  HGB 13.2  --  12.2  --   --  13.1  --   HCT 38.7  --  37.8  --   --  40.8  --   PLT 208  --  177  --   --  148*  --   APTT 36 64* 35 37* 98* 111*  --   HEPARINUNFRC 2.02* 1.92* 0.94*  --   --   --  0.86*  CREATININE 0.98  --  0.94  --   --  0.76  --     Estimated Creatinine Clearance: 70.4 mL/min (by C-G formula based on SCr of 0.76 mg/dL).  Medications:  Infusions:  . heparin 1,350 Units/hr (12/03/18 0535)  . milrinone 0.25 mcg/kg/min (12/02/18 1718)    Assessment: 63 yo female on chronic Xarelto for hx DVT/PE.  Pharmacy asked to switch to heparin in case mechanical support is needed.  CBC stable. No overt bleeding or complications noted. APTT is now therapeutic. No bleeding noted.   Goal of Therapy:  APTT: 66-102 sec Heparin level 0.3-0.7 units/ml Monitor platelets by anticoagulation protocol: Yes   Plan:  Continue heparin gtt at 1350 units/hr Daily heparin level, aPTT and CBC  Erin Hearing PharmD., BCPS Clinical Pharmacist 12/03/2018 7:45 AM

## 2018-12-03 NOTE — Progress Notes (Signed)
PROGRESS NOTE                                                                                                                                                                                                             Patient Demographics:    Jennifer [name], is a 63 y.o. female, DOB - 25-Jun-1956, FIE:[id number]  Admit date - 11/27/2018   Admitting Physician Shela Leff, MD  Outpatient Primary MD for the patient is Monico Blitz, MD  LOS - 5   Chief Complaint  Patient presents with  . Shortness of Breath       Brief Narrative   63 year old female with history of CAD, status post CABG in December 2019, ischemic cardiomyopathy, EF of 25%, hypertension, dyslipidemia, IBS was just diagnosed with pulmonary embolism 2 to 3 weeks ago at [facility] 1/20, started [place], presented to the ED from cardiology clinic secondary to dyspnea, her work-up significant for a drop in her EF 10 to 15%, as well significant for new mitral regurgitation was seen by CHF team.  PICC line inserted, started on milrinone drip.    Subjective:    Jennifer [name] today reports he is more dyspneic today, denies any cough or chest pain    Assessment  & Plan :    Principal Problem:   Exertional dyspnea Active Problems:   HTN (hypertension)   Dyslipidemia, goal LDL below 70   Pulmonary embolism (HCC)   AKI (acute kidney injury) (HCC)   Shortness of breath  Acute on chronic systolic CHF RV systolic dysfunction -Multifactorial, in the setting of acute on chronic systolic CHF, with significant drop in EF to 10 to 15%, severe mitral regurgitation, and recent diagnosis of PE . -Management per CHF team, given low EF 10 to 15%, she was quite symptomatic on presentation, sister reports some dyspnea at minimal movement in bed today, diuresis per cardiology, IV Lasix has been stopped yesterday, her CVP slightly elevated today. - Continue with milrinone drip -No beta-blockers in the  setting of acute right RV failure, given blood pressures improved she is started on low-dose losartan, and Aldactone. -CHF team considering mechanical support in the near future.-May need right heart cath per CHF team -On heparin GTT instead of Xarelto for possible need for procedures.  Severe mitral regurgitation -Management per cardiology, new diagnosis on echo this admission,  CAD/CABG -Denies any chest pain today, continue with aspirin and statin, beta-blockers on hold, started on low-dose losartan -LIMA to the LAD and an SVG to the OM on 12/6/19with Dr Roxan Hockey  History of recent bilateral PE -Diagnosed in [facility] last month, she is on Xarelto, currently on heparin GTT for possible need for procedures.  Hypokalemia -Was repleted, it is 4.4, will decrease her potassium supplements to 30 once daily  Depression -Continue duloxetine  Dyslipidemia -Continue statin  Sinus tachycardia -Rate in the 110s to 120s on telemetry, possibly related to nonischemic cardiomyopathy, as well rebound tachycardia from holding her home beta-blockers   Code Status : Full  Family Communication  : none at bedside  Disposition Plan  : home when stable  Barriers For Discharge : remains on IV lasix, milrinone drip  Consults  :  Cardiology/CHF  Procedures  : None  DVT Prophylaxis  :  Xarelto>> heparin GTT  Lab Results  Component Value Date   PLT 148 (L) 12/03/2018    Antibiotics  :    Anti-infectives (From admission, onward)   None        Objective:   Vitals:   12/02/18 2318 12/03/18 0413 12/03/18 0745 12/03/18 0944  BP: (!) 97/46 93/61    Pulse: (!) 111 (!) 109  (!) 0  Resp: 18 18    Temp: 98.5 F (36.9 C) 98.8 F (37.1 C) 98.3 F (36.8 C)   TempSrc: Oral Oral Oral   SpO2: 97% 97%    Weight:  79.9 kg    Height:        Wt Readings from Last 3 Encounters:  12/03/18 79.9 kg  11/27/18 81.8 kg  11/08/18 79.8 kg     Intake/Output Summary (Last 24 hours)  at 12/03/2018 1030 Last data filed at 12/03/2018 1000 Gross per 24 hour  Intake 641.65 ml  Output 1275 ml  Net -633.35 ml     Physical Exam  Awake Alert, Oriented X 3, No new F.N deficits, Normal affect Symmetrical Chest wall movement, Good air movement bilaterally, mild crackles at the bases RRR,No Gallops,Rubs or new Murmurs, No Parasternal Heave +ve B.Sounds, Abd Soft, No tenderness, No rebound - guarding or rigidity. No Cyanosis, Clubbing or edema, No new Rash or bruise         Data Review:    CBC Recent Labs  Lab 11/27/18 2100 11/30/18 0540 12/01/18 1005 12/02/18 0403 12/03/18 0440  WBC 9.0 8.1 8.5 7.4 7.2  HGB 12.9 13.0 13.2 12.2 13.1  HCT 39.7 40.4 38.7 37.8 40.8  PLT 233 224 208 177 148*  MCV 97.5 98.8 97.2 99.2 100.7*  MCH 31.7 31.8 33.2 32.0 32.3  MCHC 32.5 32.2 34.1 32.3 32.1  RDW 12.6 13.1 13.1 13.1 13.5  LYMPHSABS 3.4  --   --   --   --   MONOABS 0.6  --   --   --   --   EOSABS 0.3  --   --   --   --   BASOSABS 0.1  --   --   --   --     Chemistries  Recent Labs  Lab 11/27/18 2100  11/29/18 0423 11/30/18 0540 12/01/18 1005 12/02/18 0403 12/03/18 0440  NA 138   < > 141 139 137 139 138  K 3.5   < > 3.6 3.6 3.5 2.9* 4.4  CL 104   < > 104 104 99 102 103  CO2 22   < > 27 25 27  25 29  GLUCOSE 100*   < > 105* 103* 169* 98 112*  BUN 30*   < > 27* 28* 24* 20 19  CREATININE 1.05*   < > 1.05* 1.05* 0.98 0.94 0.76  CALCIUM 9.0   < > 8.9 8.7* 9.0 7.8* 8.8*  MG  --   --   --  2.0  --   --  2.3  AST 49*  --   --   --   --   --   --   ALT 71*  --   --   --   --   --   --   ALKPHOS 112  --   --   --   --   --   --   BILITOT 0.9  --   --   --   --   --   --    < > = values in this interval not displayed.   ------------------------------------------------------------------------------------------------------------------ No results for input(s): CHOL, HDL, LDLCALC, TRIG, CHOLHDL, LDLDIRECT in the last 72 hours.  Lab Results  Component Value Date    HGBA1C 5.0 09/22/2018   ------------------------------------------------------------------------------------------------------------------ No results for input(s): TSH, T4TOTAL, T3FREE, THYROIDAB in the last 72 hours.  Invalid input(s): FREET3 ------------------------------------------------------------------------------------------------------------------ No results for input(s): VITAMINB12, FOLATE, FERRITIN, TIBC, IRON, RETICCTPCT in the last 72 hours.  Coagulation profile No results for input(s): INR, PROTIME in the last 168 hours.  No results for input(s): DDIMER in the last 72 hours.  Cardiac Enzymes Recent Labs  Lab 11/28/18 0138 11/28/18 0715  TROPONINI 0.07* 0.06*   ------------------------------------------------------------------------------------------------------------------    Component Value Date/Time   BNP 1,311.1 (H) 11/27/2018 2041    Inpatient Medications  Scheduled Meds: . aspirin  81 mg Oral QPM  . atorvastatin  80 mg Oral q1800  . cholecalciferol  1,000 Units Oral Daily  . digoxin  0.125 mg Oral Daily  . DULoxetine  60 mg Oral BID  . gabapentin  600 mg Oral TID  . ivabradine  2.5 mg Oral BID WC  . losartan  12.5 mg Oral BID  . [START ON 12/04/2018] potassium chloride  30 mEq Oral Daily  . pramipexole  0.5 mg Oral QHS  . sodium chloride flush  10-40 mL Intracatheter Q12H  . spironolactone  25 mg Oral Daily  . vitamin C  500 mg Oral Daily   Continuous Infusions: . heparin 1,350 Units/hr (12/03/18 1000)  . milrinone 0.25 mcg/kg/min (12/03/18 1000)   PRN Meds:.acetaminophen **OR** acetaminophen, sodium chloride flush  Micro Results Recent Results (from the past 240 hour(s))  MRSA PCR Screening     Status: None   Collection Time: 12/02/18 11:01 AM  Result Value Ref Range Status   MRSA by PCR NEGATIVE NEGATIVE Final    Comment:        The GeneXpert MRSA Assay (FDA approved for NASAL specimens only), is one component of a comprehensive MRSA  colonization surveillance program. It is not intended to diagnose MRSA infection nor to guide or monitor treatment for MRSA infections. Performed at [facility], [place] [address], [place], [place] [address]     Radiology Reports Dg Chest 2 View  Result Date: 11/27/2018 CLINICAL DATA:  Shortness of breath for 4-5 days. EXAM: CHEST - 2 VIEW COMPARISON:  Single-view of the chest and CT chest 11/05/2018. FINDINGS: The patient is status post CABG. There is cardiomegaly. Small right pleural effusion is noted. No left effusion. Lungs are clear. No pneumothorax. No acute bony  abnormality. IMPRESSION: Cardiomegaly without edema. Small right pleural effusion. Electronically Signed   By: Inge Rise M.D.   On: 11/27/2018 18:20   [place] Ekg Site Rite  Result Date: 11/30/2018 If Site Rite image not attached, placement could not be confirmed due to current cardiac rhythm.    Phillips Climes M.D on 12/03/2018 at 10:30 AM  Between 7am to 7pm - Pager - [phone]  After 7pm go to [email or web address] - password [facility]  [facility] -  Office  [phone]

## 2018-12-03 NOTE — Progress Notes (Signed)
Patient ID: Jennifer [name], female   DOB: [birth year], 63 y.o.   MRN: [id number]     Advanced Heart Failure Rounding Note  PCP-Cardiologist: Kate Sable, MD   Subjective:   Events   2-13 Started milrinone 0.25 mcg for low CO-OX  (31%).  Remains on milrinone 0.25, IV Lasix stopped yesterday.  Co-ox drawn in early am lower at 51%.  CVP 13-15. She feels ok today, not short of breath at rest.  SBP 90s with HR 110s.    On IV heparin.   Echo EF 10-15% with moderate RV dysfunction.    Objective:   Weight Range: 79.9 kg Body mass index is 32.22 kg/m.   Vital Signs:   Temp:  [97.9 F (36.6 C)-98.9 F (37.2 C)] 98.3 F (36.8 C) (02/16 0745) Pulse Rate:  [0-118] 0 (02/16 0944) Resp:  [18-22] 18 (02/16 0413) BP: (93-102)/(46-67) 93/61 (02/16 0413) SpO2:  [96 %-97 %] 97 % (02/16 0413) Weight:  [79.9 kg] 79.9 kg (02/16 0413) Last BM Date: 12/01/18  Weight change: Filed Weights   12/01/18 0357 12/02/18 0348 12/03/18 0413  Weight: 80.1 kg 79.4 kg 79.9 kg    Intake/Output:   Intake/Output Summary (Last 24 hours) at 12/03/2018 1050 Last data filed at 12/03/2018 1000 Gross per 24 hour  Intake 641.65 ml  Output 1275 ml  Net -633.35 ml      Physical Exam    General: NAD Neck: JVP 10-11 cm, no thyromegaly or thyroid nodule.  Lungs: Clear to auscultation bilaterally with normal respiratory effort. CV: Lateral PMI.  Heart mildy tachy, regular S1/S2, no S3/S4, 2/6 HSM apex.  Trace ankle edema.   Abdomen: Soft, nontender, no hepatosplenomegaly, no distention.  Skin: Intact without lesions or rashes.  Neurologic: Alert and oriented x 3.  Psych: Normal affect. Extremities: No clubbing or cyanosis.  HEENT: Normal.    Telemetry  Sinus Tach 110s Personally reviewed  EKG    n/a  Labs    CBC Recent Labs    12/02/18 0403 12/03/18 0440  WBC 7.4 7.2  HGB 12.2 13.1  HCT 37.8 40.8  MCV 99.2 100.7*  PLT 177 941*   Basic Metabolic Panel Recent Labs    12/02/18 0403  12/03/18 0440  NA 139 138  K 2.9* 4.4  CL 102 103  CO2 25 29  GLUCOSE 98 112*  BUN 20 19  CREATININE 0.94 0.76  CALCIUM 7.8* 8.8*  MG  --  2.3   Liver Function Tests No results for input(s): AST, ALT, ALKPHOS, BILITOT, PROT, ALBUMIN in the last 72 hours. No results for input(s): LIPASE, AMYLASE in the last 72 hours. Cardiac Enzymes No results for input(s): CKTOTAL, CKMB, CKMBINDEX, TROPONINI in the last 72 hours.  BNP: BNP (last 3 results) Recent Labs    11/27/18 2041  BNP 1,311.1*    ProBNP (last 3 results) No results for input(s): PROBNP in the last 8760 hours.   D-Dimer No results for input(s): DDIMER in the last 72 hours. Hemoglobin A1C No results for input(s): HGBA1C in the last 72 hours. Fasting Lipid Panel No results for input(s): CHOL, HDL, LDLCALC, TRIG, CHOLHDL, LDLDIRECT in the last 72 hours. Thyroid Function Tests No results for input(s): TSH, T4TOTAL, T3FREE, THYROIDAB in the last 72 hours.  Invalid input(s): FREET3  Other results:   Imaging    No results found.   Medications:     Scheduled Medications: . aspirin  81 mg Oral QPM  . atorvastatin  80 mg Oral q1800  .  cholecalciferol  1,000 Units Oral Daily  . digoxin  0.125 mg Oral Daily  . DULoxetine  60 mg Oral BID  . furosemide  60 mg Intravenous BID  . gabapentin  600 mg Oral TID  . ivabradine  5 mg Oral BID WC  . losartan  12.5 mg Oral BID  . [START ON 12/04/2018] potassium chloride  30 mEq Oral Daily  . pramipexole  0.5 mg Oral QHS  . sodium chloride flush  10-40 mL Intracatheter Q12H  . spironolactone  25 mg Oral Daily  . vitamin C  500 mg Oral Daily    Infusions: . heparin 1,350 Units/hr (12/03/18 1000)  . milrinone 0.25 mcg/kg/min (12/03/18 1000)    PRN Medications: acetaminophen **OR** acetaminophen, sodium chloride flush    Patient Profile   Jennifer [name] is a 63 y.o. female with a history of CAD s/p CABG 09/2018, longstanding chronic systolic HF due to NICM 63+  years ago, HTN, HL, and fibromyalgia. Recent diagnosis (1/20) of bilateral PE on xarelto.   Admitted with worsening SOB.   Assessment/Plan   1. A/C systolic HF with new RV failure. Prior hx of NICM x 15+ years, now with ICM with CABG 09/2018. New RV failure secondary to acute bilateral PE in 1/20. - Echo 11/28/18: EF 10-15%, RV moderately reduced, severe functional MR - Co-ox lower this morning with CVP 13-15 off Lasix.  - Continue milrinone 0.25, will repeat co-ox (initially drawn early am).  - Lasix 60 mg IV bid today with elevated CVP and stable creatinine.  - Continue  digoxin 0.125 mg daily - Continue losartan 12.5 mg bid and spironolactone 25.  - Increase ivabradine to 5 mg bid.  - No b-blocker yet with shock - RHC may be helpful this week to ascertain RV function, will need to minimize time off anticoagulation with recent large PE.  - Overall still tenuous.  I suspect this is mostly NICM as EF decreased far out of proportion to CAD. Will continue milrinone for now. Titrate HF meds. Possible need for advanced therapies in future but recent large PE with RV failure is a complicator. Was on Xarelto for DVT/PE but switched to heparin in case she needs mechanical support in near future. Blood type A pos   2. Bilateral PE - Diagnosed at [facility] 10/2018 - Xarelto stopped and heparin started in the event she needs supportive devices.    - Discussed dosing with PharmD personally.  3. CAD s/p CABG 09/2018 - LIMA to the LAD and an SVG to the OM on 09/22/18 with Dr Roxan Hockey - No chest pain - Continue 81 mg asa.  - Continue statin - No bb with rv failure  4. Severe MR - New diagnosis on echo this admit. Echo at [facility] showed mod to severe MR 10/2018. Mild MR on TEE 09/22/18. 2/20 echo with severe functional MR.   5. Hypokalemia - Will supp  6. Tachycardia - Sinus tachy.  7. ?OSA - Will need repeat sleep study outpatient.  Consult cardiac rehab.     Length of  Stay: [place], [place]  12/03/2018, 10:50 AM  Advanced Heart Failure Team Pager [phone] (M-F; 7a - 4p)  Please contact [facility] for night-coverage after hours (4p -7a ) and weekends on [email or web address]

## 2018-12-04 ENCOUNTER — Other Ambulatory Visit: Payer: Self-pay | Admitting: Physician Assistant

## 2018-12-04 DIAGNOSIS — E785 Hyperlipidemia, unspecified: Secondary | ICD-10-CM

## 2018-12-04 DIAGNOSIS — N179 Acute kidney failure, unspecified: Secondary | ICD-10-CM

## 2018-12-04 LAB — BASIC METABOLIC PANEL
Anion gap: 8 (ref 5–15)
BUN: 19 mg/dL (ref 8–23)
CO2: 26 mmol/L (ref 22–32)
Calcium: 8.5 mg/dL — ABNORMAL LOW (ref 8.9–10.3)
Chloride: 103 mmol/L (ref 98–111)
Creatinine, Ser: 0.87 mg/dL (ref 0.44–1.00)
GFR calc Af Amer: >60 mL/min (ref >60)
GFR calc non Af Amer: >60 mL/min (ref >60)
GLUCOSE: 106 mg/dL — AB (ref 70–99)
Potassium: 4 mmol/L (ref 3.5–5.1)
Sodium: 137 mmol/L (ref 135–145)

## 2018-12-04 LAB — HEPARIN LEVEL (UNFRACTIONATED)
Heparin Unfractionated: 0.86 IU/mL — ABNORMAL HIGH (ref 0.30–0.70)
Heparin Unfractionated: 0.71 IU/mL — ABNORMAL HIGH (ref 0.30–0.70)
Heparin Unfractionated: 0.51 IU/mL (ref 0.30–0.70)

## 2018-12-04 LAB — COOXEMETRY PANEL
Carboxyhemoglobin: 1.7 % — ABNORMAL HIGH (ref 0.5–1.5)
Carboxyhemoglobin: 1.1 % (ref 0.5–1.5)
Methemoglobin: 0.8 % (ref 0.0–1.5)
Methemoglobin: 1.5 % (ref 0.0–1.5)
O2 Saturation: 70.7 %
O2 Saturation: 44.2 %
Total hemoglobin: 12.3 g/dL (ref 12.0–16.0)
Total hemoglobin: 13.7 g/dL (ref 12.0–16.0)

## 2018-12-04 LAB — CBC
HCT: 38.6 % (ref 36.0–46.0)
Hemoglobin: 12.3 g/dL (ref 12.0–15.0)
MCH: 32.5 pg (ref 26.0–34.0)
MCHC: 31.9 g/dL (ref 30.0–36.0)
MCV: 101.8 fL — ABNORMAL HIGH (ref 80.0–100.0)
Platelets: 114 10*3/uL — ABNORMAL LOW (ref 150–400)
RBC: 3.79 MIL/uL — AB (ref 3.87–5.11)
RDW: 13.7 % (ref 11.5–15.5)
WBC: 6.7 10*3/uL (ref 4.0–10.5)
nRBC: 0 % (ref 0.0–0.2)

## 2018-12-04 LAB — APTT: aPTT: 118 seconds — ABNORMAL HIGH (ref 24–36)

## 2018-12-04 MED ORDER — MILRINONE LACTATE IN DEXTROSE 20-5 MG/100ML-% IV SOLN
0.1250 ug/kg/min | INTRAVENOUS | Status: DC
  Administered 2018-12-05: 0.125 ug/kg/min via INTRAVENOUS
  Filled 2018-12-04: qty 100

## 2018-12-04 MED ORDER — FUROSEMIDE 40 MG PO TABS
40.0000 mg | ORAL_TABLET | Freq: Every day | ORAL | Status: DC
  Administered 2018-12-04: 40 mg via ORAL
  Filled 2018-12-04 (×2): qty 1

## 2018-12-04 NOTE — Progress Notes (Signed)
CARDIAC REHAB PHASE I   PRE:  Rate/Rhythm: 101 ST PVCs  BP:  Supine:   Sitting: 93/61  Standing:    SaO2: 95%RA  MODE:  Ambulation: 500 ft   POST:  Rate/Rhythm: 122 ST PVCs  BP:  Supine:   Sitting: 102/69  Standing:    SaO2: 91%RA 0915-0950 Pt walked 500 ft on RA pushing IV pole. No DOE and pt stated breathing well. Reviewed zones of  CHF booklet and discussed 2000 mg sodium restriction. Pt stated  she is very familiar with this information as her mother had CHF. Pt stated she weighs daily. To recliner after walk with call bell.   Graylon Good, RN BSN  12/04/2018 9:43 AM

## 2018-12-04 NOTE — Progress Notes (Signed)
PROGRESS NOTE  Jennifer [name]  TIW:[id number] DOB: [birth year] DOA: 11/27/2018 PCP: Monico Blitz, MD  Outpatient Specialists: Cardiology, Dr. Bronson Ing Brief Narrative: 63 year old female with history of CAD, status post CABG in December 2019, ischemic cardiomyopathy, EF of 25%, hypertension, dyslipidemia, IBS was just [facility] with [facility] 1/[facility], started [place], presented to the ED from cardiology clinic secondary to dyspnea, her work-up significant for a drop in her EF 10 to 15%, as well significant for new mitral regurgitation was seen by CHF team.  PICC line inserted, started on milrinone drip.  Assessment & Plan: Principal Problem:   Exertional dyspnea Active Problems:   HTN (hypertension)   Dyslipidemia, goal LDL below 70   Pulmonary embolism (HCC)   AKI (acute kidney injury) ([place])   Shortness of breath   Acute on chronic combined systolic and diastolic CHF (congestive heart failure) (HCC)  Acute on chronic systolic CHF and RV failure: EF down to 10-15%, severe mitral regurgitation. Unlikely to be ischemic cardiomyopathy with recent revascularization. RV failure hopefully to improve with Tx PE.  - Continue milrinone (started 2/13 w/coox 31%) per HF team. Co-ox up, symptomatically improving. - On heparin gtt in case of need for RHC - Holding BB - Continue lasix, spironolactone, ivabradine, digoxin  Bilateral PE: Dx Jan 2020.  - Replacing xarelto with heparin gtt while awaiting decision Re: RHC or other procedures.   CAD s/p CABG Dec 2019: LIMA to LAD, SVG to OM by Dr. Roxan Hockey.  - On ASA, statin  Mitral regurgitation: Severe.  - Management per cardiology, new diagnosis on echo this admission. -Denies any chest pain today, continue with aspirin and statin, beta-blockers on hold, started on low-dose losartan -LIMA to the LAD and an SVG to the OM on 12/6/19with Dr Roxan Hockey  Depression: Chronic, stable - Continue  duloxetine  Dyslipidemia: - Continue statin  Sinus tachycardia -Rate in the 110s to 120s on telemetry, possibly related to nonischemic cardiomyopathy, as well rebound tachycardia from holding her home beta-blockers  Hypokalemia: Resolved.  - Continue close monitoring and supplement as indicated.   DVT prophylaxis: Heparin gtt Code Status: Full Family Communication: None at bedside Disposition Plan: Home once stabilized   Consultants:   Advanced Heart Failure Team  Procedures:   None  Antimicrobials:  None   Subjective: Feels she's breathing much better, still somewhat winded with exertion. No orthopnea or leg swelling.   Objective: Vitals:   12/04/18 0353 12/04/18 0434 12/04/18 0441 12/04/18 0728  BP: (!) 88/49  (!) 84/50 (!) 83/56  Pulse: 93  94 96  Resp: 20  20 18   Temp: 97.9 F (36.6 C)   98.7 F (37.1 C)  TempSrc: Oral   Oral  SpO2: 97%  95% 94%  Weight:  80.9 kg    Height:        Intake/Output Summary (Last 24 hours) at 12/04/2018 1220 Last data filed at 12/04/2018 1034 Gross per 24 hour  Intake 1023.07 ml  Output 2050 ml  Net -1026.93 ml   Filed Weights   12/02/18 0348 12/03/18 0413 12/04/18 0434  Weight: 79.4 kg 79.9 kg 80.9 kg    Gen: 63 y.o. female in no distress  Pulm: Non-labored breathing. Clear to auscultation bilaterally.  CV: Regular borderline tachycardia. II/VI apical systolic murmur, no rub, or gallop. + JVD, trace pedal edema. GI: Abdomen soft, non-tender, non-distended, with normoactive bowel sounds. No organomegaly or masses felt. Ext: Warm, no deformities Skin: No rashes, lesions or ulcers  Neuro: Alert and oriented. No focal neurological deficits. Psych: Judgement and insight appear normal. Mood & affect appropriate.   Data Reviewed: I have personally reviewed following labs and imaging studies  CBC: Recent Labs  Lab 11/27/18 2100 11/30/18 0540 12/01/18 1005 12/02/18 0403 12/03/18 0440 12/04/18 0410  WBC 9.0 8.1 8.5  7.4 7.2 6.7  NEUTROABS 4.6  --   --   --   --   --   HGB 12.9 13.0 13.2 12.2 13.1 12.3  HCT 39.7 40.4 38.7 37.8 40.8 38.6  MCV 97.5 98.8 97.2 99.2 100.7* 101.8*  PLT 233 224 208 177 148* 790*   Basic Metabolic Panel: Recent Labs  Lab 11/30/18 0540 12/01/18 1005 12/02/18 0403 12/03/18 0440 12/04/18 0410  NA 139 137 139 138 137  K 3.6 3.5 2.9* 4.4 4.0  CL 104 99 102 103 103  CO2 25 27 25 29 26   GLUCOSE 103* 169* 98 112* 106*  BUN 28* 24* 20 19 19   CREATININE 1.05* 0.98 0.94 0.76 0.87  CALCIUM 8.7* 9.0 7.8* 8.8* 8.5*  MG 2.0  --   --  2.3  --    GFR: Estimated Creatinine Clearance: 65.2 mL/min (by C-G formula based on SCr of 0.87 mg/dL). Liver Function Tests: Recent Labs  Lab 11/27/18 2100  AST 49*  ALT 71*  ALKPHOS 112  BILITOT 0.9  PROT 6.9  ALBUMIN 3.6   No results for input(s): LIPASE, AMYLASE in the last 168 hours. No results for input(s): AMMONIA in the last 168 hours. Coagulation Profile: No results for input(s): INR, PROTIME in the last 168 hours. Cardiac Enzymes: Recent Labs  Lab 11/28/18 0138 11/28/18 0715  TROPONINI 0.07* 0.06*   BNP (last 3 results) No results for input(s): PROBNP in the last 8760 hours. HbA1C: No results for input(s): HGBA1C in the last 72 hours. CBG: No results for input(s): GLUCAP in the last 168 hours. Lipid Profile: No results for input(s): CHOL, HDL, LDLCALC, TRIG, CHOLHDL, LDLDIRECT in the last 72 hours. Thyroid Function Tests: No results for input(s): TSH, T4TOTAL, FREET4, T3FREE, THYROIDAB in the last 72 hours. Anemia Panel: No results for input(s): VITAMINB12, FOLATE, FERRITIN, TIBC, IRON, RETICCTPCT in the last 72 hours. Urine analysis:    Component Value Date/Time   COLORURINE YELLOW 09/21/2018 [place] 09/21/2018 1414   LABSPEC 1.029 09/21/2018 1414   PHURINE 6.0 09/21/2018 1414   GLUCOSEU NEGATIVE 09/21/2018 1414   HGBUR MODERATE (A) 09/21/2018 1414   BILIRUBINUR NEGATIVE 09/21/2018 1414    KETONESUR NEGATIVE 09/21/2018 1414   PROTEINUR NEGATIVE 09/21/2018 1414   NITRITE NEGATIVE 09/21/2018 1414   LEUKOCYTESUR NEGATIVE 09/21/2018 1414   Recent Results (from the past 240 hour(s))  MRSA PCR Screening     Status: None   Collection Time: 12/02/18 11:01 AM  Result Value Ref Range Status   MRSA by PCR NEGATIVE NEGATIVE Final    Comment:        The GeneXpert MRSA Assay (FDA approved for NASAL specimens only), is one component of a comprehensive MRSA colonization surveillance program. It is not intended to diagnose MRSA infection nor to guide or monitor treatment for MRSA infections. Performed at [facility], [place] [address], [place], [place] [address]       Radiology Studies: No results found.  Scheduled Meds: . aspirin  81 mg Oral QPM  . atorvastatin  80 mg Oral q1800  . cholecalciferol  1,000 Units Oral Daily  . digoxin  0.125 mg Oral  Daily  . DULoxetine  60 mg Oral BID  . furosemide  40 mg Oral Daily  . gabapentin  600 mg Oral TID  . ivabradine  5 mg Oral BID WC  . losartan  12.5 mg Oral BID  . pramipexole  0.5 mg Oral QHS  . sodium chloride flush  10-40 mL Intracatheter Q12H  . spironolactone  25 mg Oral Daily  . vitamin C  500 mg Oral Daily   Continuous Infusions: . heparin 1,250 Units/hr (12/04/18 0622)  . milrinone 0.125 mcg/kg/min (12/04/18 1034)     LOS: 6 days   Time spent: 25 minutes.  Patrecia Pour, MD [facility] [email or web address] Password [facility] 12/04/2018, 12:20 PM

## 2018-12-04 NOTE — Progress Notes (Addendum)
Patient ID: TAMICA COVELL, female   DOB: [birth year], 63 y.o.   MRN: [id number]     Advanced Heart Failure Rounding Note  PCP-Cardiologist: Kate Sable, MD   Subjective:   Events   2-13 Started milrinone 0.25 mcg for low CO-OX  (31%).  Coox 70.7% on milrinone 0.25. CVP ~6 on my personal check. Cr 0.87.  Negative 500 cc on IV lasix 60 mg BID. Weight shows up 2 lbs.   Feeling great this am. Sitting up in bed speaking energetically on the phone on my arrival. Denies lightheadedness or dizziness. No CP or SOB.   On IV heparin.   Echo 11/28/2018 EF 10-15% with moderate RV dysfunction.   Objective:   Weight Range: 80.9 kg Body mass index is 32.62 kg/m.   Vital Signs:   Temp:  [97.9 F (36.6 C)-98.7 F (37.1 C)] 98.7 F (37.1 C) (02/17 0728) Pulse Rate:  [0-107] 96 (02/17 0728) Resp:  [18-22] 18 (02/17 0728) BP: (80-101)/(46-70) 83/56 (02/17 0728) SpO2:  [94 %-100 %] 94 % (02/17 0728) Weight:  [80.9 kg] 80.9 kg (02/17 0434) Last BM Date: 12/01/18  Weight change: Filed Weights   12/02/18 0348 12/03/18 0413 12/04/18 0434  Weight: 79.4 kg 79.9 kg 80.9 kg    Intake/Output:   Intake/Output Summary (Last 24 hours) at 12/04/2018 0843 Last data filed at 12/04/2018 0622 Gross per 24 hour  Intake 1497.69 ml  Output 2050 ml  Net -552.31 ml     Physical Exam    General: NAD HEENT: Normal Neck: Supple. JVP ~6 cm. Carotids 2+ bilat; no bruits. No thyromegaly or nodule noted. Cor: PMI lateral. Regular, slightly tachy. 2/6 HSM apex. Lungs: CTAB, normal effort. Abdomen: Soft, non-tender, non-distended, no HSM. No bruits or masses. +BS  Extremities: No cyanosis, clubbing, or rash. Trace ankle edema.  Neuro: Alert & orientedx3, cranial nerves grossly intact. moves all 4 extremities w/o difficulty. Affect pleasant   Telemetry   Sinus tach 100-110s, personally reviewed.   EKG    No new tracings.    Labs    CBC Recent Labs    12/03/18 0440 12/04/18 0410  WBC 7.2  6.7  HGB 13.1 12.3  HCT 40.8 38.6  MCV 100.7* 101.8*  PLT 148* 185*   Basic Metabolic Panel Recent Labs    12/03/18 0440 12/04/18 0410  NA 138 137  K 4.4 4.0  CL 103 103  CO2 29 26  GLUCOSE 112* 106*  BUN 19 19  CREATININE 0.76 0.87  CALCIUM 8.8* 8.5*  MG 2.3  --    Liver Function Tests No results for input(s): AST, ALT, ALKPHOS, BILITOT, PROT, ALBUMIN in the last 72 hours. No results for input(s): LIPASE, AMYLASE in the last 72 hours. Cardiac Enzymes No results for input(s): CKTOTAL, CKMB, CKMBINDEX, TROPONINI in the last 72 hours.  BNP: BNP (last 3 results) Recent Labs    11/27/18 2041  BNP 1,311.1*    ProBNP (last 3 results) No results for input(s): PROBNP in the last 8760 hours.   D-Dimer No results for input(s): DDIMER in the last 72 hours. Hemoglobin A1C No results for input(s): HGBA1C in the last 72 hours. Fasting Lipid Panel No results for input(s): CHOL, HDL, LDLCALC, TRIG, CHOLHDL, LDLDIRECT in the last 72 hours. Thyroid Function Tests No results for input(s): TSH, T4TOTAL, T3FREE, THYROIDAB in the last 72 hours.  Invalid input(s): FREET3  Other results:   Imaging    No results found.   Medications:     Scheduled  Medications: . aspirin  81 mg Oral QPM  . atorvastatin  80 mg Oral q1800  . cholecalciferol  1,000 Units Oral Daily  . digoxin  0.125 mg Oral Daily  . DULoxetine  60 mg Oral BID  . furosemide  60 mg Intravenous BID  . gabapentin  600 mg Oral TID  . ivabradine  5 mg Oral BID WC  . losartan  12.5 mg Oral BID  . potassium chloride  30 mEq Oral Daily  . pramipexole  0.5 mg Oral QHS  . sodium chloride flush  10-40 mL Intracatheter Q12H  . spironolactone  25 mg Oral Daily  . vitamin C  500 mg Oral Daily    Infusions: . heparin 1,250 Units/hr (12/04/18 0622)  . milrinone 0.25 mcg/kg/min (12/04/18 0622)    PRN Medications: acetaminophen **OR** acetaminophen, sodium chloride flush    Patient Profile   EVADEAN SPROULE  is a 63 y.o. female with a history of CAD s/p CABG 09/2018, longstanding chronic systolic HF due to NICM 38+ years ago, HTN, HL, and fibromyalgia. Recent diagnosis (1/20) of bilateral PE on xarelto.   Admitted with worsening SOB.   Assessment/Plan   1. A/C systolic HF with new RV failure. Prior hx of NICM x 15+ years, now with ICM with CABG 09/2018. New RV failure secondary to acute bilateral PE in 1/20. - Echo 11/28/18: EF 10-15%, RV moderately reduced, severe functional MR - Co-ox 70.7% this am on milrinone 0.25 - CVP 6-7 cm. Stop IV lasix. Transition to po lasix 40 mg daily (Was on as needed at home) - Continue  digoxin 0.125 mg daily - Continue losartan 12.5 mg BID and spironolactone 25. No room to increase today.  - Continue ivabradine 5 mg BID - No b-blocker yet with shock - RHC may be helpful this week to ascertain RV function, will need to minimize time off anticoagulation with recent large PE.  - Overall still tenuous.  Suspect this is mostly NICM as EF decreased far out of proportion to CAD. Will continue milrinone for now. Titrate HF meds. Possible need for advanced therapies in future but recent large PE with RV failure is a complicator. Was on Xarelto for DVT/PE but switched to heparin in case she needs mechanical support in near future. Blood type A POS  2. Bilateral PE - Diagnosed at [facility] 10/2018 - Xarelto stopped and heparin started in the event she needs supportive devices.    - Dosing per PharmD.   3. CAD s/p CABG 09/2018 - LIMA to the LAD and an SVG to the OM on 09/22/18 with Dr Roxan Hockey - No s/s of ischemia.    - Continue 81 mg asa.  - Continue statin - No bb with rv failure  4. Severe MR - New diagnosis on echo this admit. Echo at [facility] showed mod to severe MR 10/2018. Mild MR on TEE 09/22/18. 2/20 echo with severe functional MR.  - Follow.   5. Hypokalemia - K 4.0 this am. Follow.   6. Tachycardia - Sinus tachy. Treating HF as above.    7. ?OSA - Will need repeat sleep study outpatient. No change  8. Deconditioning - CR consulted.   Coox 70.7%. Will move to po lasix and cut milrinone back to 0.125 mcg/gk/min. Recheck coox this pm.   Length of Stay: [address]  Annamaria Helling  12/04/2018, 8:43 AM  Advanced Heart Failure Team Pager [phone] (M-F; 7a - 4p)  Please contact [facility] for night-coverage  after hours (4p -7a ) and weekends on [email or web address]  She remains on milrinone for inotropic support. Colume status much improved but still tachycardic. BP soft. Co-ox ox. Agree with cutting back milrinone to 0.125. Will need RHC later this week. I remained concerned that she may need. Advanced therapies. Will get cMRI.   Glori Bickers, MD  1:32 PM

## 2018-12-04 NOTE — Progress Notes (Signed)
[place] [facility] for IV heparin Indication: Hx DVT/PE, holding Xarelto  Patient Measurements: Height: 5\' 2"  (157.5 cm) Weight: 178 lb 5.6 oz (80.9 kg) IBW/kg (Calculated) : 50.1 Heparin Dosing Weight: 68.3 kg  Vital Signs: Temp: 99 F (37.2 C) (02/17 1239) Temp Source: Oral (02/17 1239) BP: 95/61 (02/17 1239) Pulse Rate: 96 (02/17 1239)  Labs: Recent Labs    12/02/18 0403  12/03/18 0440 12/03/18 0441 12/03/18 1359 12/04/18 0410 12/04/18 1450  HGB 12.2  --  13.1  --   --  12.3  --   HCT 37.8  --  40.8  --   --  38.6  --   PLT 177  --  148*  --   --  114*  --   APTT 35   < > 111*  --  95* 118*  --   HEPARINUNFRC 0.94*  --   --  0.86*  --  0.86* 0.71*  CREATININE 0.94  --  0.76  --   --  0.87  --    < > = values in this interval not displayed.    Estimated Creatinine Clearance: 65.2 mL/min (by C-G formula based on SCr of 0.87 mg/dL).  Medications:  Infusions:  . heparin 1,250 Units/hr (12/04/18 0622)  . milrinone 0.125 mcg/kg/min (12/04/18 1034)    Assessment: 63 yo female on chronic Xarelto for hx DVT/PE. Pharmacy asked to switch to heparin in case mechanical support is needed. CBC stable. No overt bleeding or complications noted. Heparin level now very slightly above goal. No bleeding or issues with infusion per discussion with RN.  Goal of Therapy:  APTT 66-102 sec Heparin level 0.3-0.7 units/ml Monitor platelets by anticoagulation protocol: Yes   Plan:  Reduce heparin gtt slightly to 1200 units/hr Check 6hr heparin level Monitor daily heparin level and CBC, s/sx bleeding  Elicia Lamp, PharmD, BCPS Please check AMION for all [place] contact numbers Clinical Pharmacist 12/04/2018 3:45 PM

## 2018-12-04 NOTE — Progress Notes (Signed)
ANTICOAGULATION CONSULT NOTE  Pharmacy Consult for IV heparin Indication: Hx DVT/PE, holding Xarelto  Patient Measurements: Height: 5\' 2"  (157.5 cm) Weight: 178 lb 5.6 oz (80.9 kg) IBW/kg (Calculated) : 50.1 Heparin Dosing Weight: 68.3 kg  Vital Signs: Temp: 98.7 F (37.1 C) (02/17 1935) Temp Source: Oral (02/17 1935) BP: 96/64 (02/17 1935) Pulse Rate: 95 (02/17 1935)  Labs: Recent Labs    12/02/18 0403  12/03/18 0440  12/03/18 1359 12/04/18 0410 12/04/18 1450 12/04/18 2230  HGB 12.2  --  13.1  --   --  12.3  --   --   HCT 37.8  --  40.8  --   --  38.6  --   --   PLT 177  --  148*  --   --  114*  --   --   APTT 35   < > 111*  --  95* 118*  --   --   HEPARINUNFRC 0.94*  --   --    < >  --  0.86* 0.71* 0.51  CREATININE 0.94  --  0.76  --   --  0.87  --   --    < > = values in this interval not displayed.    Estimated Creatinine Clearance: 65.2 mL/min (by C-G formula based on SCr of 0.87 mg/dL).  Medications:  Infusions:  . heparin 1,200 Units/hr (12/04/18 1655)  . milrinone 0.125 mcg/kg/min (12/04/18 1034)    Assessment: 62 yo female on chronic Xarelto for hx DVT/PE. Pharmacy asked to switch to heparin in case mechanical support is needed. CBC stable.   Heparin level therapeutic (0.51) on gtt at 1200 units/hr. No bleeding noted.  Goal of Therapy:  Heparin level 0.3-0.7 units/ml Monitor platelets by anticoagulation protocol: Yes   Plan:  Continue heparin gtt at 1200 units/hr Monitor daily heparin level and CBC, s/sx bleeding  Sherlon Handing, PharmD, BCPS Clinical pharmacist  **Pharmacist phone directory can now be found on [email or web address] ([email or web address]).  Listed under [place]. 12/04/2018 11:09 PM

## 2018-12-04 NOTE — Progress Notes (Signed)
[place] for IV heparin Indication: Hx DVT/PE, holding Xarelto  Patient Measurements: Height: 5\' 2"  (157.5 cm) Weight: 178 lb 5.6 oz (80.9 kg) IBW/kg (Calculated) : 50.1 Heparin Dosing Weight: 68.3 kg  Vital Signs: Temp: 97.9 F (36.6 C) (02/17 0353) Temp Source: Oral (02/17 0353) BP: 84/50 (02/17 0441) Pulse Rate: 94 (02/17 0441)  Labs: Recent Labs    12/01/18 1005  12/02/18 0403  12/03/18 0440 12/03/18 0441 12/03/18 1359 12/04/18 0410  HGB 13.2  --  12.2  --  13.1  --   --   --   HCT 38.7  --  37.8  --  40.8  --   --   --   PLT 208  --  177  --  148*  --   --   --   APTT 36   < > 35   < > 111*  --  95* 118*  HEPARINUNFRC 2.02*   < > 0.94*  --   --  0.86*  --  0.86*  CREATININE 0.98  --  0.94  --  0.76  --   --  0.87   < > = values in this interval not displayed.    Estimated Creatinine Clearance: 65.2 mL/min (by C-G formula based on SCr of 0.87 mg/dL).  Medications:  Infusions:  . heparin 1,350 Units/hr (12/04/18 0342)  . milrinone 0.25 mcg/kg/min (12/04/18 0342)    Assessment: 63 yo female on chronic Xarelto for hx DVT/PE.  Pharmacy asked to switch to heparin in case mechanical support is needed.  CBC stable. No overt bleeding or complications noted. APTT and heparin level are above goal. No bleeding noted.    Goal of Therapy:  APTT: 66-102 sec Heparin level 0.3-0.7 units/ml Monitor platelets by anticoagulation protocol: Yes   Plan:  Reduce heparin gtt to 1250 units/hr Check an 8 hr heparin level  Salome Arnt, PharmD, BCPS Please see AMION for all pharmacy numbers 12/04/2018 5:32 AM

## 2018-12-04 NOTE — Plan of Care (Signed)
  Problem: Education: Goal: Ability to demonstrate management of disease process will improve Outcome: Progressing Goal: Ability to verbalize understanding of medication therapies will improve Outcome: Progressing Goal: Individualized Educational Video(s) Outcome: Progressing   Problem: Activity: Goal: Capacity to carry out activities will improve Outcome: Progressing   Problem: Cardiac: Goal: Ability to achieve and maintain adequate cardiopulmonary perfusion will improve Outcome: Progressing   Problem: Education: Goal: Knowledge of General Education information will improve Description Including pain rating scale, medication(s)/side effects and non-pharmacologic comfort measures Outcome: Progressing   Problem: Health Behavior/Discharge Planning: Goal: Ability to manage health-related needs will improve Outcome: Progressing   Problem: Clinical Measurements: Goal: Ability to maintain clinical measurements within normal limits will improve Outcome: Progressing Goal: Will remain free from infection Outcome: Progressing Goal: Diagnostic test results will improve Outcome: Progressing Goal: Respiratory complications will improve Outcome: Progressing Goal: Cardiovascular complication will be avoided Outcome: Progressing   Problem: Activity: Goal: Risk for activity intolerance will decrease Outcome: Progressing   Problem: Coping: Goal: Level of anxiety will decrease Outcome: Progressing   Problem: Nutrition: Goal: Adequate nutrition will be maintained Outcome: Adequate for Discharge   Problem: Elimination: Goal: Will not experience complications related to bowel motility Outcome: Adequate for Discharge Goal: Will not experience complications related to urinary retention Outcome: Adequate for Discharge   Problem: Pain Managment: Goal: General experience of comfort will improve Outcome: Adequate for Discharge   Problem: Safety: Goal: Ability to remain free from  injury will improve Outcome: Adequate for Discharge   Problem: Skin Integrity: Goal: Risk for impaired skin integrity will decrease Outcome: Adequate for Discharge

## 2018-12-05 ENCOUNTER — Encounter (HOSPITAL_COMMUNITY)
Admission: EM | Disposition: A | Discharge status: Home / Self Care | Payer: Self-pay | Source: Home / Self Care | Attending: Internal Medicine

## 2018-12-05 ENCOUNTER — Encounter (HOSPITAL_COMMUNITY): Payer: Self-pay | Admitting: Internal Medicine

## 2018-12-05 ENCOUNTER — Inpatient Hospital Stay (HOSPITAL_COMMUNITY): Payer: Medicare PPO

## 2018-12-05 DIAGNOSIS — I429 Cardiomyopathy, unspecified: Secondary | ICD-10-CM

## 2018-12-05 DIAGNOSIS — G4733 Obstructive sleep apnea (adult) (pediatric): Secondary | ICD-10-CM

## 2018-12-05 DIAGNOSIS — I2699 Other pulmonary embolism without acute cor pulmonale: Secondary | ICD-10-CM

## 2018-12-05 DIAGNOSIS — I509 Heart failure, unspecified: Secondary | ICD-10-CM

## 2018-12-05 HISTORY — PX: RIGHT HEART CATH: CATH118263

## 2018-12-05 LAB — POCT I-STAT EG7
Acid-Base Excess: 1 mmol/L (ref 0.0–2.0)
Acid-base deficit: 2 mmol/L (ref 0.0–2.0)
BICARBONATE: 26.2 mmol/L (ref 20.0–28.0)
Bicarbonate: 23.7 mmol/L (ref 20.0–28.0)
Calcium, Ion: 1.21 mmol/L (ref 1.15–1.40)
Calcium, Ion: 1.14 mmol/L — ABNORMAL LOW (ref 1.15–1.40)
HCT: 38 % (ref 36.0–46.0)
HCT: 37 % (ref 36.0–46.0)
Hemoglobin: 12.9 g/dL (ref 12.0–15.0)
Hemoglobin: 12.6 g/dL (ref 12.0–15.0)
O2 Saturation: 66 %
O2 Saturation: 66 %
PH VEN: 7.374 (ref 7.250–7.430)
Potassium: 4.5 mmol/L (ref 3.5–5.1)
Potassium: 4.2 mmol/L (ref 3.5–5.1)
Sodium: 138 mmol/L (ref 135–145)
Sodium: 140 mmol/L (ref 135–145)
TCO2: 28 mmol/L (ref 22–32)
TCO2: 25 mmol/L (ref 22–32)
pCO2, Ven: 44.9 mmHg (ref 44.0–60.0)
pCO2, Ven: 42.1 mmHg — ABNORMAL LOW (ref 44.0–60.0)
pH, Ven: 7.359 (ref 7.250–7.430)
pO2, Ven: 36 mmHg (ref 32.0–45.0)
pO2, Ven: 36 mmHg (ref 32.0–45.0)

## 2018-12-05 LAB — CBC
HCT: 40.5 % (ref 36.0–46.0)
Hemoglobin: 12.7 g/dL (ref 12.0–15.0)
MCH: 32.2 pg (ref 26.0–34.0)
MCHC: 31.4 g/dL (ref 30.0–36.0)
MCV: 102.8 fL — ABNORMAL HIGH (ref 80.0–100.0)
Platelets: 109 10*3/uL — ABNORMAL LOW (ref 150–400)
RBC: 3.94 MIL/uL (ref 3.87–5.11)
RDW: 13.7 % (ref 11.5–15.5)
WBC: 7 10*3/uL (ref 4.0–10.5)
nRBC: 0 % (ref 0.0–0.2)

## 2018-12-05 LAB — BASIC METABOLIC PANEL
Anion gap: 8 (ref 5–15)
BUN: 19 mg/dL (ref 8–23)
CHLORIDE: 102 mmol/L (ref 98–111)
CO2: 29 mmol/L (ref 22–32)
Calcium: 9.1 mg/dL (ref 8.9–10.3)
Creatinine, Ser: 0.92 mg/dL (ref 0.44–1.00)
GFR calc Af Amer: >60 mL/min (ref >60)
GFR calc non Af Amer: >60 mL/min (ref >60)
Glucose, Bld: 115 mg/dL — ABNORMAL HIGH (ref 70–99)
Potassium: 4.7 mmol/L (ref 3.5–5.1)
Sodium: 139 mmol/L (ref 135–145)

## 2018-12-05 LAB — COOXEMETRY PANEL
Carboxyhemoglobin: 1.4 % (ref 0.5–1.5)
Methemoglobin: 1.6 % — ABNORMAL HIGH (ref 0.0–1.5)
O2 SAT: 61.4 %
Total hemoglobin: 13.1 g/dL (ref 12.0–16.0)

## 2018-12-05 LAB — DIGOXIN LEVEL: Digoxin Level: 0.3 ng/mL — ABNORMAL LOW (ref 0.8–2.0)

## 2018-12-05 LAB — HEPARIN LEVEL (UNFRACTIONATED): Heparin Unfractionated: 0.58 IU/mL (ref 0.30–0.70)

## 2018-12-05 SURGERY — RIGHT HEART CATH
Anesthesia: LOCAL

## 2018-12-05 MED ORDER — HEPARIN (PORCINE) 25000 UT/250ML-% IV SOLN
1200.0000 [IU]/h | INTRAVENOUS | Status: DC
  Administered 2018-12-05 – 2018-12-06 (×2): 1200 [IU]/h via INTRAVENOUS
  Filled 2018-12-05: qty 250

## 2018-12-05 MED ORDER — SODIUM CHLORIDE 0.9 % IV SOLN: 250.0000 mL | INTRAVENOUS | Status: DC | PRN

## 2018-12-05 MED ORDER — SODIUM CHLORIDE 0.9% FLUSH: 3.0000 mL | INTRAVENOUS | Status: DC | PRN

## 2018-12-05 MED ORDER — SODIUM CHLORIDE 0.9 % IV SOLN: INTRAVENOUS | Status: DC

## 2018-12-05 MED ORDER — LIDOCAINE HCL (PF) 1 % IJ SOLN
INTRAMUSCULAR | Status: AC
  Filled 2018-12-05: qty 30

## 2018-12-05 MED ORDER — SODIUM CHLORIDE 0.9% FLUSH
3.0000 mL | Freq: Two times a day (BID) | INTRAVENOUS | Status: DC
  Administered 2018-12-05: 3 mL via INTRAVENOUS

## 2018-12-05 MED ORDER — ONDANSETRON HCL 4 MG/2ML IJ SOLN: 4.0000 mg | Freq: Four times a day (QID) | INTRAMUSCULAR | Status: DC | PRN

## 2018-12-05 MED ORDER — SODIUM CHLORIDE 0.9 % IV SOLN
INTRAVENOUS | Status: DC
  Administered 2018-12-05: 07:00:00 via INTRAVENOUS

## 2018-12-05 MED ORDER — ACETAMINOPHEN 325 MG PO TABS: 650.0000 mg | ORAL_TABLET | ORAL | Status: DC | PRN

## 2018-12-05 MED ORDER — HEPARIN (PORCINE) IN NACL 1000-0.9 UT/500ML-% IV SOLN
INTRAVENOUS | Status: AC
  Filled 2018-12-05: qty 500

## 2018-12-05 MED ORDER — SODIUM CHLORIDE 0.9% FLUSH: 3.0000 mL | Freq: Two times a day (BID) | INTRAVENOUS | Status: DC

## 2018-12-05 MED ORDER — GADOBUTROL 1 MMOL/ML IV SOLN
9.0000 mL | Freq: Once | INTRAVENOUS | Status: AC | PRN
  Administered 2018-12-05: 9 mL via INTRAVENOUS

## 2018-12-05 MED ORDER — FUROSEMIDE 80 MG PO TABS
80.0000 mg | ORAL_TABLET | Freq: Every day | ORAL | Status: DC
  Administered 2018-12-05 – 2018-12-07 (×3): 80 mg via ORAL
  Filled 2018-12-05 (×3): qty 1

## 2018-12-05 MED ORDER — LIDOCAINE HCL (PF) 1 % IJ SOLN
INTRAMUSCULAR | Status: DC | PRN
  Administered 2018-12-05: 5 mL

## 2018-12-05 MED ORDER — HEPARIN (PORCINE) IN NACL 1000-0.9 UT/500ML-% IV SOLN
INTRAVENOUS | Status: DC | PRN
  Administered 2018-12-05: 500 mL

## 2018-12-05 SURGICAL SUPPLY — 7 items
CATH BALLN WEDGE 5F 110CM (CATHETERS) ×1 IMPLANT
KIT MICROPUNCTURE NIT STIFF (SHEATH) ×1 IMPLANT
PACK CARDIAC CATHETERIZATION (CUSTOM PROCEDURE TRAY) ×2 IMPLANT
SHEATH GLIDE SLENDER 4/5FR (SHEATH) ×1 IMPLANT
TRANSDUCER W/STOPCOCK (MISCELLANEOUS) ×2 IMPLANT
TUBING ART PRESS 72  MALE/FEM (TUBING) ×1
TUBING ART PRESS 72 MALE/FEM (TUBING) IMPLANT

## 2018-12-05 NOTE — Progress Notes (Addendum)
PROGRESS NOTE  Jennifer [name]  BDZ:[id number] DOB: 1956/08/12 DOA: 11/27/2018 PCP: Monico Blitz, MD  Outpatient Specialists: Cardiology, Dr. Bronson Ing Brief Narrative: 63 year old female with history of CAD, status post CABG in December 2019, ischemic cardiomyopathy, EF of 25%, hypertension, dyslipidemia, IBS was just diagnosed with pulmonary embolism 2 to [facility] weeks ago[facility] 1/20, started [place], presented to the ED from cardiology clinic secondary to dyspnea, her work-up significant for a drop in her EF 10 to 15%, as well significant for new mitral regurgitation was seen by CHF team.  PICC line inserted, started on milrinone drip.  Assessment & Plan: Principal Problem:   Exertional dyspnea Active Problems:   HTN (hypertension)   Dyslipidemia, goal LDL below 70   Pulmonary embolism (HCC)   AKI (acute kidney injury) ([place])   Shortness of breath   Acute on chronic combined systolic and diastolic CHF (congestive heart failure) (HCC)  Acute on chronic systolic CHF and RV failure: EF down to 10-15%, severe mitral regurgitation. Unlikely to be ischemic cardiomyopathy with recent revascularization. RV failure hopefully to improve with Tx PE.  - Per heart failure team, weaning milrinone to off today. - On heparin gtt in case of need for procedure - Holding BB - Continue lasix (transitioned to po, increased dose), spironolactone, ivabradine, digoxin - Cardiac MRI pending. - RHC today  Bilateral PE: Dx Jan 2020.  - Replacing xarelto with heparin gtt while awaiting decision Re: procedures  CAD s/p CABG Dec 2019: LIMA to LAD, SVG to OM by Dr. Roxan Hockey.  - On ASA, statin  Mitral regurgitation: Severe.  - Management per cardiology, new diagnosis on echo this admission.  Macrocytosis: with history of neuropathy.  - Check anemia panel in AM  Depression: Chronic, stable - Continue duloxetine  Dyslipidemia: - Continue statin  Hypokalemia: Resolved.  - Continue close  monitoring and supplement as indicated.   DVT prophylaxis: Heparin gtt Code Status: Full Family Communication: Daughter at bedside Disposition Plan: Home once stabilized   Consultants:   Advanced Heart Failure Team  Procedures:   None  Antimicrobials:  None   Subjective: No new complaints. Denies dyspnea at rest, no chest pain. No palpitations. Has had neuropathy for decades, not a vegetarian or heavy beer drinker. Has been told she had iron deficiency in the past.  Objective: Vitals:   12/05/18 0854 12/05/18 0859 12/05/18 0904 12/05/18 1153  BP: (!) 106/55 (!) 107/58 (!) 107/59 115/75  Pulse: (!) 101 (!) 101 (!) 102 98  Resp: 11 16 13 20   Temp:    97.9 F (36.6 C)  TempSrc:    Oral  SpO2: 95% 95% 96% 95%  Weight:      Height:        Intake/Output Summary (Last 24 hours) at 12/05/2018 1522 Last data filed at 12/05/2018 1200 Gross per 24 hour  Intake 739.02 ml  Output 550 ml  Net 189.02 ml   Filed Weights   12/03/18 0413 12/04/18 0434 12/05/18 0427  Weight: 79.9 kg 80.9 kg 80.4 kg   Gen: 63 y.o. female in no distress Pulm: Nonlabored breathing room air at rest. Clear. CV: Regular rate and rhythm. II/VI soft systolic murmur, no rub, or gallop. Improved JVD, trace dependent edema. GI: Abdomen soft, non-tender, non-distended, with normoactive bowel sounds.  Ext: Warm, no deformities Skin: No rashes, lesions or ulcers on visualized and palpated skin. Neuro: Alert and oriented. No focal neurological deficits. Psych: Judgement and insight appear fair. Mood euthymic & affect congruent. Behavior is appropriate.  Data Reviewed: I have personally reviewed following labs and imaging studies  CBC: Recent Labs  Lab 12/01/18 1005 12/02/18 0403 12/03/18 0440 12/04/18 0410 12/05/18 0425 12/05/18 0854 12/05/18 0855  WBC 8.5 7.4 7.2 6.7 7.0  --   --   HGB 13.2 12.2 13.1 12.3 12.7 12.6 12.9  HCT 38.7 37.8 40.8 38.6 40.5 37.0 38.0  MCV 97.2 99.2 100.7* 101.8* 102.8*   --   --   PLT 208 177 148* 114* 109*  --   --    Basic Metabolic Panel: Recent Labs  Lab 11/30/18 0540 12/01/18 1005 12/02/18 0403 12/03/18 0440 12/04/18 0410 12/05/18 0425 12/05/18 0854 12/05/18 0855  NA 139 137 139 138 137 139 140 138  K 3.6 3.5 2.9* 4.4 4.0 4.7 4.2 4.5  CL 104 99 102 103 103 102  --   --   CO2 25 27 25 29 26 29   --   --   GLUCOSE 103* 169* 98 112* 106* 115*  --   --   BUN 28* 24* 20 19 19 19   --   --   CREATININE 1.05* 0.98 0.94 0.76 0.87 0.92  --   --   CALCIUM 8.7* 9.0 7.8* 8.8* 8.5* 9.1  --   --   MG 2.0  --   --  2.3  --   --   --   --    GFR: Estimated Creatinine Clearance: 61.5 mL/min (by C-G formula based on SCr of 0.92 mg/dL). Liver Function Tests: No results for input(s): AST, ALT, ALKPHOS, BILITOT, PROT, ALBUMIN in the last 168 hours. No results for input(s): LIPASE, AMYLASE in the last 168 hours. No results for input(s): AMMONIA in the last 168 hours. Coagulation Profile: No results for input(s): INR, PROTIME in the last 168 hours. Cardiac Enzymes: No results for input(s): CKTOTAL, CKMB, CKMBINDEX, TROPONINI in the last 168 hours. BNP (last 3 results) No results for input(s): PROBNP in the last 8760 hours. HbA1C: No results for input(s): HGBA1C in the last 72 hours. CBG: No results for input(s): GLUCAP in the last 168 hours. Lipid Profile: No results for input(s): CHOL, HDL, LDLCALC, TRIG, CHOLHDL, LDLDIRECT in the last 72 hours. Thyroid Function Tests: No results for input(s): TSH, T4TOTAL, FREET4, T3FREE, THYROIDAB in the last 72 hours. Anemia Panel: No results for input(s): VITAMINB12, FOLATE, FERRITIN, TIBC, IRON, RETICCTPCT in the last 72 hours. Urine analysis:    Component Value Date/Time   COLORURINE YELLOW 09/21/2018 [place] 09/21/2018 1414   LABSPEC 1.029 09/21/2018 1414   PHURINE 6.0 09/21/2018 1414   GLUCOSEU NEGATIVE 09/21/2018 1414   HGBUR MODERATE (A) 09/21/2018 1414   BILIRUBINUR NEGATIVE 09/21/2018  1414   KETONESUR NEGATIVE 09/21/2018 1414   PROTEINUR NEGATIVE 09/21/2018 1414   NITRITE NEGATIVE 09/21/2018 1414   LEUKOCYTESUR NEGATIVE 09/21/2018 1414   Recent Results (from the past 240 hour(s))  MRSA PCR Screening     Status: None   Collection Time: 12/02/18 11:01 AM  Result Value Ref Range Status   MRSA by PCR NEGATIVE NEGATIVE Final    Comment:        The GeneXpert MRSA Assay (FDA approved for NASAL specimens only), is one component of a comprehensive MRSA colonization surveillance program. It is not intended to diagnose MRSA infection nor to guide or monitor treatment for MRSA infections. Performed at [facility], [place] [address] [place], [place] [id number]       Radiology Studies: No results found.  Scheduled Meds: . aspirin  81 mg Oral QPM  . atorvastatin  80 mg Oral q1800  . cholecalciferol  1,000 Units Oral Daily  . digoxin  0.125 mg Oral Daily  . DULoxetine  60 mg Oral BID  . furosemide  80 mg Oral Daily  . gabapentin  600 mg Oral TID  . ivabradine  5 mg Oral BID WC  . losartan  12.5 mg Oral BID  . pramipexole  0.5 mg Oral QHS  . sodium chloride flush  10-40 mL Intracatheter Q12H  . sodium chloride flush  3 mL Intravenous Q12H  . spironolactone  25 mg Oral Daily  . vitamin C  500 mg Oral Daily   Continuous Infusions: . sodium chloride    . heparin 1,200 Units/hr (12/05/18 1441)     LOS: 7 days   Time spent: 25 minutes.  Patrecia Pour, MD [facility] [email or web address] Password [facility] 12/05/2018, 3:22 PM

## 2018-12-05 NOTE — Interval H&P Note (Signed)
History and Physical Interval Note:  12/05/2018 8:33 AM  Jennifer [name]  has presented today for surgery, with the diagnosis of chf  The various methods of treatment have been discussed with the patient and family. After consideration of risks, benefits and other options for treatment, the patient has consented to  Procedure(s): RIGHT HEART CATH (N/A) as a surgical intervention .  The patient's history has been reviewed, patient examined, no change in status, stable for surgery.  I have reviewed the patient's chart and labs.  Questions were answered to the patient's satisfaction.     [name]

## 2018-12-05 NOTE — Progress Notes (Signed)
[place] to see pt to walk. At MRI. Will continue to follow.Graylon Good RN BSN 12/05/2018 2:21 PM

## 2018-12-05 NOTE — Progress Notes (Addendum)
ANTICOAGULATION CONSULT NOTE  Pharmacy Consult for IV heparin Indication: Hx DVT/PE, holding Xarelto  Patient Measurements: Height: 5\' 2"  (157.5 cm) Weight: 177 lb 4.8 oz (80.4 kg) IBW/kg (Calculated) : 50.1 Heparin Dosing Weight: 68.3 kg  Vital Signs: Temp: 97.9 F (36.6 C) (02/18 0427) Temp Source: Oral (02/18 0427) BP: 107/59 (02/18 0904) Pulse Rate: 102 (02/18 0904)  Labs: Recent Labs    12/03/18 0440  12/03/18 1359 12/04/18 0410 12/04/18 1450 12/04/18 2230 12/05/18 0425 12/05/18 0855  HGB 13.1  --   --  12.3  --   --  12.7 12.9  HCT 40.8  --   --  38.6  --   --  40.5 38.0  PLT 148*  --   --  114*  --   --  109*  --   APTT 111*  --  95* 118*  --   --   --   --   HEPARINUNFRC  --    < >  --  0.86* 0.71* 0.51 0.58  --   CREATININE 0.76  --   --  0.87  --   --  0.92  --    < > = values in this interval not displayed.    Estimated Creatinine Clearance: 61.5 mL/min (by C-G formula based on SCr of 0.92 mg/dL).  Medications:  Infusions:  . sodium chloride    . heparin 1,200 Units/hr (12/05/18 0147)    Assessment: 63 yo female on chronic Xarelto for hx DVT/PE. Pharmacy asked to switch to heparin in case mechanical support is needed. CBC stable.   Heparin level therapeutic (0.58) on gtt at 1200 units/hr. No bleeding noted.  Heparin turned off this AM for RHC - asked to resume 4 hrs after sheath out, pulled at 9 AM.  Goal of Therapy:  Heparin level 0.3-0.7 units/ml Monitor platelets by anticoagulation protocol: Yes   Plan:  Resume heparin at 1200 units/hr at 1 PM Confirm heparin level 6 hrs later. Monitor daily heparin level and CBC, s/sx bleeding  Marguerite Olea, [facility] Clinical Pharmacist Phone [phone]  12/05/2018 10:47 AM

## 2018-12-05 NOTE — H&P (View-Only) (Signed)
Patient ID: TANESSA TIDD, female   DOB: [birth year], 63 y.o.   MRN: [id number]     Advanced Heart Failure Rounding Note  PCP-Cardiologist: Kate Sable, MD   Subjective:   Events   2-13 Started milrinone 0.25 mcg for low CO-OX  (31%).  Milrinone cut back to 0.125 yesterday. Initially co-ox dropped to 44% but this am back up to 61% Feels good. Denies SOB , orthopnea or PND. No bleeding on heparin.   Echo 11/28/2018 EF 10-15% with moderate RV dysfunction.   Objective:   Weight Range: 80.4 kg Body mass index is 32.43 kg/m.   Vital Signs:   Temp:  [97.9 F (36.6 C)-99 F (37.2 C)] 97.9 F (36.6 C) (02/18 0427) Pulse Rate:  [95-98] 98 (02/18 0427) Resp:  [12-20] 18 (02/18 0427) BP: (94-98)/(57-69) 94/57 (02/18 0427) SpO2:  [95 %-99 %] 99 % (02/18 0427) Weight:  [80.4 kg] 80.4 kg (02/18 0427) Last BM Date: 12/04/18  Weight change: Filed Weights   12/03/18 0413 12/04/18 0434 12/05/18 0427  Weight: 79.9 kg 80.9 kg 80.4 kg    Intake/Output:   Intake/Output Summary (Last 24 hours) at 12/05/2018 0810 Last data filed at 12/04/2018 2357 Gross per 24 hour  Intake 456.95 ml  Output 950 ml  Net -493.05 ml     Physical Exam    General:  Well appearing. No resp difficulty HEENT: normal Neck: supple. JVP 5-6. Carotids 2+ bilat; no bruits. No lymphadenopathy or thryomegaly appreciated. Cor: PMI laterally displaced. Tachy + s3 Lungs: clear Abdomen: obese soft, nontender, nondistended. No hepatosplenomegaly. No bruits or masses. Good bowel sounds. Extremities: no cyanosis, clubbing, rash, edema RUE PICC Neuro: alert & orientedx3, cranial nerves grossly intact. moves all 4 extremities w/o difficulty. Affect pleasant  Telemetry   Sinus tach 110-120s, personally reviewed.   EKG    No new tracings.    Labs    CBC Recent Labs    12/04/18 0410 12/05/18 0425  WBC 6.7 7.0  HGB 12.3 12.7  HCT 38.6 40.5  MCV 101.8* 102.8*  PLT 114* 202*   Basic Metabolic  Panel Recent Labs    12/03/18 0440 12/04/18 0410 12/05/18 0425  NA 138 137 139  K 4.4 4.0 4.7  CL 103 103 102  CO2 29 26 29   GLUCOSE 112* 106* 115*  BUN 19 19 19   CREATININE 0.76 0.87 0.92  CALCIUM 8.8* 8.5* 9.1  MG 2.3  --   --    Liver Function Tests No results for input(s): AST, ALT, ALKPHOS, BILITOT, PROT, ALBUMIN in the last 72 hours. No results for input(s): LIPASE, AMYLASE in the last 72 hours. Cardiac Enzymes No results for input(s): CKTOTAL, CKMB, CKMBINDEX, TROPONINI in the last 72 hours.  BNP: BNP (last 3 results) Recent Labs    11/27/18 2041  BNP 1,311.1*    ProBNP (last 3 results) No results for input(s): PROBNP in the last 8760 hours.   D-Dimer No results for input(s): DDIMER in the last 72 hours. Hemoglobin A1C No results for input(s): HGBA1C in the last 72 hours. Fasting Lipid Panel No results for input(s): CHOL, HDL, LDLCALC, TRIG, CHOLHDL, LDLDIRECT in the last 72 hours. Thyroid Function Tests No results for input(s): TSH, T4TOTAL, T3FREE, THYROIDAB in the last 72 hours.  Invalid input(s): FREET3  Other results:   Imaging    No results found.   Medications:     Scheduled Medications: . aspirin  81 mg Oral QPM  . atorvastatin  80 mg Oral q1800  .  cholecalciferol  1,000 Units Oral Daily  . digoxin  0.125 mg Oral Daily  . DULoxetine  60 mg Oral BID  . furosemide  40 mg Oral Daily  . gabapentin  600 mg Oral TID  . ivabradine  5 mg Oral BID WC  . losartan  12.5 mg Oral BID  . pramipexole  0.5 mg Oral QHS  . sodium chloride flush  10-40 mL Intracatheter Q12H  . sodium chloride flush  3 mL Intravenous Q12H  . spironolactone  25 mg Oral Daily  . vitamin C  500 mg Oral Daily    Infusions: . sodium chloride    . sodium chloride 10 mL/hr at 12/05/18 0649  . heparin 1,200 Units/hr (12/05/18 0147)  . milrinone 0.125 mcg/kg/min (12/05/18 0435)    PRN Medications: sodium chloride, acetaminophen **OR** acetaminophen, sodium chloride  flush, sodium chloride flush    Patient Profile   DAYANIS BERGQUIST is a 63 y.o. female with a history of CAD s/p CABG 09/2018, longstanding chronic systolic HF due to NICM 51+ years ago, HTN, HL, and fibromyalgia. Recent diagnosis (1/20) of bilateral PE on xarelto.   Admitted with worsening SOB.   Assessment/Plan   1. A/C systolic HF with new RV failure. Prior hx of NICM x 15+ years, now with ICM with CABG 09/2018. New RV failure secondary to acute bilateral PE in 1/20. - Echo 11/28/18: EF 10-15%, RV moderately reduced, severe functional MR - Co-ox 61% this am on milrinone 0.125 - CVP remains 6-8. Off IV lasix. On po lasix 40 mg daily  - Will plan RHC today - Continue digoxin 0.125 mg daily - Continue losartan 12.5 mg BID and spironolactone 25. No room to increase today.  - Continue ivabradine 5 mg BID - No b-blocker yet with shock - Overall still tenuous.  Feels better but HR still very fast. Suspect this is mostly NICM as EF decreased far out of proportion to CAD.  - Will plan RHC today to further assess hemodynamics. Will need to assess for need for home milrinone versus more urgent advanced therapies. Ideally would be best suited with outpatient transplant eval given age and blood type (A pos) - Titrate HF meds as BP tolerates. . Possible need for advanced therapies in future but recent large PE with RV failure is a complicator. Was on Xarelto for DVT/PE but switched to heparin in case she needs mechanical support in near future.   2. Bilateral PE - Diagnosed at [facility] 10/2018 - Xarelto stopped and heparin started in the event she needs supportive devices.    - Dosing per PharmD.   3. CAD s/p CABG 09/2018 - LIMA to the LAD and an SVG to the OM on 09/22/18 with Dr Roxan Hockey - No s/s of ischemia.    - Continue 81 mg asa.  - Continue statin - No bb with rv failure  4. Severe MR - New diagnosis on echo this admit. Echo at [facility] showed mod to severe MR 10/2018.  Mild MR on TEE 09/22/18. 2/20 echo with severe functional MR.  - Follow.   5. Hypokalemia - K 4.7 this am. Follow.   6. Tachycardia - Sinus tachy. Treating HF as above.   7. ?OSA - Will need repeat sleep study outpatient. No change  8. Deconditioning - CR consulted.   Length of Stay: [place], MD  12/05/2018, 8:10 AM  Advanced Heart Failure Team Pager [phone] (M-F; [place])  Please contact [facility] Cardiology for  night-coverage after hours (4p -7a ) and weekends on [email or web address]

## 2018-12-05 NOTE — Progress Notes (Signed)
Patient ID: Jennifer [name], female   DOB: 1956/01/24, 63 y.o.   MRN: [id number]     Advanced Heart Failure Rounding Note  PCP-Cardiologist: Kate Sable, MD   Subjective:   Events   2-13 Started milrinone 0.25 mcg for low CO-OX  (31%).  Milrinone cut back to 0.125 yesterday. Initially co-ox dropped to 44% but this am back up to 61% Feels good. Denies SOB , orthopnea or PND. No bleeding on heparin.   Echo 11/28/2018 EF 10-15% with moderate RV dysfunction.   Objective:   Weight Range: 80.4 kg Body mass index is 32.43 kg/m.   Vital Signs:   Temp:  [97.9 F (36.6 C)-99 F (37.2 C)] 97.9 F (36.6 C) (02/18 0427) Pulse Rate:  [95-98] 98 (02/18 0427) Resp:  [12-20] 18 (02/18 0427) BP: (94-98)/(57-69) 94/57 (02/18 0427) SpO2:  [95 %-99 %] 99 % (02/18 0427) Weight:  [80.4 kg] 80.4 kg (02/18 0427) Last BM Date: 12/04/18  Weight change: Filed Weights   12/03/18 0413 12/04/18 0434 12/05/18 0427  Weight: 79.9 kg 80.9 kg 80.4 kg    Intake/Output:   Intake/Output Summary (Last 24 hours) at 12/05/2018 0810 Last data filed at 12/04/2018 2357 Gross per 24 hour  Intake 456.95 ml  Output 950 ml  Net -493.05 ml     Physical Exam    General:  Well appearing. No resp difficulty HEENT: normal Neck: supple. JVP 5-6. Carotids 2+ bilat; no bruits. No lymphadenopathy or thryomegaly appreciated. Cor: PMI laterally displaced. Tachy + s3 Lungs: clear Abdomen: obese soft, nontender, nondistended. No hepatosplenomegaly. No bruits or masses. Good bowel sounds. Extremities: no cyanosis, clubbing, rash, edema RUE PICC Neuro: alert & orientedx3, cranial nerves grossly intact. moves all 4 extremities w/o difficulty. Affect pleasant  Telemetry   Sinus tach 110-120s, personally reviewed.   EKG    No new tracings.    Labs    CBC Recent Labs    12/04/18 0410 12/05/18 0425  WBC 6.7 7.0  HGB 12.3 12.7  HCT 38.6 40.5  MCV 101.8* 102.8*  PLT 114* 497*   Basic Metabolic  Panel Recent Labs    12/03/18 0440 12/04/18 0410 12/05/18 0425  NA 138 137 139  K 4.4 4.0 4.7  CL 103 103 102  CO2 29 26 29   GLUCOSE 112* 106* 115*  BUN 19 19 19   CREATININE 0.76 0.87 0.92  CALCIUM 8.8* 8.5* 9.1  MG 2.3  --   --    Liver Function Tests No results for input(s): AST, ALT, ALKPHOS, BILITOT, PROT, ALBUMIN in the last 72 hours. No results for input(s): LIPASE, AMYLASE in the last 72 hours. Cardiac Enzymes No results for input(s): CKTOTAL, CKMB, CKMBINDEX, TROPONINI in the last 72 hours.  BNP: BNP (last 3 results) Recent Labs    11/27/18 2041  BNP 1,311.1*    ProBNP (last 3 results) No results for input(s): PROBNP in the last 8760 hours.   D-Dimer No results for input(s): DDIMER in the last 72 hours. Hemoglobin A1C No results for input(s): HGBA1C in the last 72 hours. Fasting Lipid Panel No results for input(s): CHOL, HDL, LDLCALC, TRIG, CHOLHDL, LDLDIRECT in the last 72 hours. Thyroid Function Tests No results for input(s): TSH, T4TOTAL, T3FREE, THYROIDAB in the last 72 hours.  Invalid input(s): FREET3  Other results:   Imaging    No results found.   Medications:     Scheduled Medications: . aspirin  81 mg Oral QPM  . atorvastatin  80 mg Oral q1800  .  cholecalciferol  1,000 Units Oral Daily  . digoxin  0.125 mg Oral Daily  . DULoxetine  60 mg Oral BID  . furosemide  40 mg Oral Daily  . gabapentin  600 mg Oral TID  . ivabradine  5 mg Oral BID WC  . losartan  12.5 mg Oral BID  . pramipexole  0.5 mg Oral QHS  . sodium chloride flush  10-40 mL Intracatheter Q12H  . sodium chloride flush  3 mL Intravenous Q12H  . spironolactone  25 mg Oral Daily  . vitamin C  500 mg Oral Daily    Infusions: . sodium chloride    . sodium chloride 10 mL/hr at 12/05/18 0649  . heparin 1,200 Units/hr (12/05/18 0147)  . milrinone 0.125 mcg/kg/min (12/05/18 0435)    PRN Medications: sodium chloride, acetaminophen **OR** acetaminophen, sodium chloride  flush, sodium chloride flush    Patient Profile   Jennifer [name] is a 63 y.o. female with a history of CAD s/p CABG 09/2018, longstanding chronic systolic HF due to NICM 35+ years ago, HTN, HL, and fibromyalgia. Recent diagnosis (1/20) of bilateral PE on xarelto.   Admitted with worsening SOB.   Assessment/Plan   1. A/C systolic HF with new RV failure. Prior hx of NICM x 15+ years, now with ICM with CABG 09/2018. New RV failure secondary to acute bilateral PE in 1/20. - Echo 11/28/18: EF 10-15%, RV moderately reduced, severe functional MR - Co-ox 61% this am on milrinone 0.125 - CVP remains 6-8. Off IV lasix. On po lasix 40 mg daily  - Will plan RHC today - Continue digoxin 0.125 mg daily - Continue losartan 12.5 mg BID and spironolactone 25. No room to increase today.  - Continue ivabradine 5 mg BID - No b-blocker yet with shock - Overall still tenuous.  Feels better but HR still very fast. Suspect this is mostly NICM as EF decreased far out of proportion to CAD.  - Will plan RHC today to further assess hemodynamics. Will need to assess for need for home milrinone versus more urgent advanced therapies. Ideally would be best suited with outpatient transplant eval given age and blood type (A pos) - Titrate HF meds as BP tolerates. . Possible need for advanced therapies in future but recent large PE with RV failure is a complicator. Was on Xarelto for DVT/PE but switched to heparin in case she needs mechanical support in near future.   2. Bilateral PE - Diagnosed at [facility] 10/2018 - Xarelto stopped and heparin started in the event she needs supportive devices.    - Dosing per PharmD.   3. CAD s/p CABG 09/2018 - LIMA to the LAD and an SVG to the OM on 09/22/18 with Dr Roxan Hockey - No s/s of ischemia.    - Continue 81 mg asa.  - Continue statin - No bb with rv failure  4. Severe MR - New diagnosis on echo this admit. Echo at [facility] showed mod to severe MR 10/2018.  Mild MR on TEE 09/22/18. 2/20 echo with severe functional MR.  - Follow.   5. Hypokalemia - K 4.7 this am. Follow.   6. Tachycardia - Sinus tachy. Treating HF as above.   7. ?OSA - Will need repeat sleep study outpatient. No change  8. Deconditioning - CR consulted.   Length of Stay: [place], MD  12/05/2018, 8:10 AM  Advanced Heart Failure Team Pager [phone] (M-F; [place])  Please contact [facility] for  night-coverage after hours (4p -7a ) and weekends on [email or web address]

## 2018-12-06 LAB — IRON AND TIBC
Iron: 158 ug/dL (ref 28–170)
Saturation Ratios: 39 % — ABNORMAL HIGH (ref 10.4–31.8)
TIBC: 410 ug/dL (ref 250–450)
UIBC: 252 ug/dL

## 2018-12-06 LAB — BASIC METABOLIC PANEL
Anion gap: 7 (ref 5–15)
BUN: 20 mg/dL (ref 8–23)
CALCIUM: 9.3 mg/dL (ref 8.9–10.3)
CO2: 28 mmol/L (ref 22–32)
CREATININE: 0.79 mg/dL (ref 0.44–1.00)
Chloride: 102 mmol/L (ref 98–111)
GFR calc Af Amer: >60 mL/min (ref >60)
GFR calc non Af Amer: >60 mL/min (ref >60)
Glucose, Bld: 110 mg/dL — ABNORMAL HIGH (ref 70–99)
Potassium: 4.3 mmol/L (ref 3.5–5.1)
Sodium: 137 mmol/L (ref 135–145)

## 2018-12-06 LAB — COOXEMETRY PANEL
CARBOXYHEMOGLOBIN: 1.2 % (ref 0.5–1.5)
METHEMOGLOBIN: 1.4 % (ref 0.0–1.5)
O2 Saturation: 53.8 %
Total hemoglobin: 15.3 g/dL (ref 12.0–16.0)

## 2018-12-06 LAB — CBC
HCT: 46.2 % — ABNORMAL HIGH (ref 36.0–46.0)
Hemoglobin: 14.2 g/dL (ref 12.0–15.0)
MCH: 31.7 pg (ref 26.0–34.0)
MCHC: 30.7 g/dL (ref 30.0–36.0)
MCV: 103.1 fL — ABNORMAL HIGH (ref 80.0–100.0)
Platelets: 113 10*3/uL — ABNORMAL LOW (ref 150–400)
RBC: 4.48 MIL/uL (ref 3.87–5.11)
RDW: 13.6 % (ref 11.5–15.5)
WBC: 8.4 10*3/uL (ref 4.0–10.5)
nRBC: 0 % (ref 0.0–0.2)

## 2018-12-06 LAB — FERRITIN: Ferritin: 209 ng/mL (ref 11–307)

## 2018-12-06 LAB — HEPARIN INDUCED PLATELET AB (HIT ANTIBODY): Heparin Induced Plt Ab: 0.45 OD — ABNORMAL HIGH (ref 0.000–0.400)

## 2018-12-06 LAB — HEPARIN LEVEL (UNFRACTIONATED): Heparin Unfractionated: 0.67 IU/mL (ref 0.30–0.70)

## 2018-12-06 LAB — FOLATE: Folate: 14.8 ng/mL (ref >5.9)

## 2018-12-06 LAB — VITAMIN B12: Vitamin B-12: 346 pg/mL (ref 180–914)

## 2018-12-06 MED ORDER — IVABRADINE HCL 7.5 MG PO TABS
7.5000 mg | ORAL_TABLET | Freq: Two times a day (BID) | ORAL | Status: DC
  Administered 2018-12-06 – 2018-12-07 (×2): 7.5 mg via ORAL
  Filled 2018-12-06 (×2): qty 1

## 2018-12-06 NOTE — Progress Notes (Signed)
PROGRESS NOTE  Jennifer [name] JJK:[id number] DOB: May 17, 1956 DOA: 11/27/2018 PCP: Monico Blitz, MD  HPI/Recap of past 24 hours:  Sitting up in chair on room air, denies pain, no sob , no edema, no fever, urine output 2.7liter documented last 24hrs Right arm picc line in place, she is off milrinone drip since yesterday, she remained on heparin drip  Assessment/Plan: Principal Problem:   Exertional dyspnea Active Problems:   HTN (hypertension)   Dyslipidemia, goal LDL below 70   Pulmonary embolism (HCC)   AKI (acute kidney injury) ([place])   Shortness of breath   Acute on chronic combined systolic and diastolic CHF (congestive heart failure) (HCC)  Acute on chronic systolic chf and RV failure: -EF down to 10-15%, severe mitral regurgitation. Unlikely to be ischemic cardiomyopathy with recent revascularization. RV failure hopefully to improve with Tx PE -cardiac MRI "1.  Moderately dilated LV with EF 21%, diffuse hypokinesis. 2. Normal RV size with moderately decreased systolic function, EF 37%. 3.  Moderate TR, at least moderate central MR (likely functional). 4. No myocardial LGE, so no evidence for prior infarct, infiltrative disease, or myocarditis." S/p right heart cath on 2/18 -currently on lasix, spironolactone, dig, losartan, ivabradine, avoid betablocker due to low output, management per heart failure team, she is currently off milrinone drip,   Bilateral PE Diagnosed at [facility] 10/2018 - Xarelto stopped and heparin started in the event she needs supportive devices -no chest pain, no hypoxia  CAD s/p CABG Dec 2019: LIMA to LAD, SVG to OM by Dr. Roxan Hockey.  - On ASA, statin  Hypokalemia: replaced and normalized  Depression: Chronic, stable - Continue duloxetine  Code Status: full  Family Communication: patient   Disposition Plan: needs cardiology clearance    Consultants:  Cardiology /heart failure  Procedures: picc line placement   Right heart Cardiac  cath  2/18  Antibiotics:  none   Objective: BP 92/65 (BP Location: Left Arm)   Pulse (!) 102   Temp 97.9 F (36.6 C) (Oral)   Resp 18   Ht 5\' 2"  (1.575 m)   Wt 80.4 kg   SpO2 95%   BMI 32.43 kg/m   Intake/Output Summary (Last 24 hours) at 12/06/2018 0957 Last data filed at 12/06/2018 0500 Gross per 24 hour  Intake 293.35 ml  Output 2725 ml  Net -2431.65 ml   Filed Weights   12/03/18 0413 12/04/18 0434 12/05/18 0427  Weight: 79.9 kg 80.9 kg 80.4 kg    Exam: Patient is examined daily including today on 12/06/2018, exams remain the same as of yesterday except that has changed    General:  NAD  Cardiovascular: RRR  Respiratory: CTABL  Abdomen: Soft/ND/NT, positive BS  Musculoskeletal: No Edema  Neuro: alert, oriented   Data Reviewed: Basic Metabolic Panel: Recent Labs  Lab 11/30/18 0540  12/02/18 0403 12/03/18 0440 12/04/18 0410 12/05/18 0425 12/05/18 0854 12/05/18 0855 12/06/18 0510  NA 139   < > 139 138 137 139 140 138 137  K 3.6   < > 2.9* 4.4 4.0 4.7 4.2 4.5 4.3  CL 104   < > 102 103 103 102  --   --  102  CO2 25   < > 25 29 26 29   --   --  28  GLUCOSE 103*   < > 98 112* 106* 115*  --   --  110*  BUN 28*   < > 20 19 19 19   --   --  20  CREATININE 1.05*   < > 0.94 0.76 0.87 0.92  --   --  0.79  CALCIUM 8.7*   < > 7.8* 8.8* 8.5* 9.1  --   --  9.3  MG 2.0  --   --  2.3  --   --   --   --   --    < > = values in this interval not displayed.   Liver Function Tests: No results for input(s): AST, ALT, ALKPHOS, BILITOT, PROT, ALBUMIN in the last 168 hours. No results for input(s): LIPASE, AMYLASE in the last 168 hours. No results for input(s): AMMONIA in the last 168 hours. CBC: Recent Labs  Lab 12/02/18 0403 12/03/18 0440 12/04/18 0410 12/05/18 0425 12/05/18 0854 12/05/18 0855 12/06/18 0510  WBC 7.4 7.2 6.7 7.0  --   --  8.4  HGB 12.2 13.1 12.3 12.7 12.6 12.9 14.2  HCT 37.8 40.8 38.6 40.5 37.0 38.0 46.2*  MCV 99.2 100.7* 101.8* 102.8*  --    --  103.1*  PLT 177 148* 114* 109*  --   --  113*   Cardiac Enzymes:   No results for input(s): CKTOTAL, CKMB, CKMBINDEX, TROPONINI in the last 168 hours. BNP (last 3 results) Recent Labs    11/27/18 2041  BNP 1,311.1*    ProBNP (last 3 results) No results for input(s): PROBNP in the last 8760 hours.  CBG: No results for input(s): GLUCAP in the last 168 hours.  Recent Results (from the past 240 hour(s))  MRSA PCR Screening     Status: None   Collection Time: 12/02/18 11:01 AM  Result Value Ref Range Status   MRSA by PCR NEGATIVE NEGATIVE Final    Comment:        The GeneXpert MRSA Assay (FDA approved for NASAL specimens only), is one component of a comprehensive MRSA colonization surveillance program. It is not intended to diagnose MRSA infection nor to guide or monitor treatment for MRSA infections. Performed at [facility], [place] [address], [place], [place] [address]      Studies: Mr Cardiac Morphology W Wo Contrast  Result Date: 12/05/2018 CLINICAL DATA:  Cardiomyopathy of uncertain etiology EXAM: CARDIAC MRI TECHNIQUE: The patient was scanned on a 1.5 Tesla GE magnet. A dedicated cardiac coil was used. Functional imaging was done using Fiesta sequences. 2,3, and 4 chamber views were done to assess for RWMA's. Modified Simpson's rule using a short axis stack was used to calculate an ejection fraction on a dedicated work Conservation officer, nature. The patient received 8 cc of Multihance. After 10 minutes inversion recovery sequences were used to assess for infiltration and scar tissue. FINDINGS: The patient is status post sternotomy. Limited images of the lung fields showed no gross abnormalities. Moderate left ventricular dilation with normal wall thickness. Diffuse hypokinesis, EF 21%. No LV thrombus noted. Normal right ventricular size with moderately decreased systolic function, EF 53%. Mild left atrial enlargement. Normal right atrial size. Trileaflet aortic  valve, no significant stenosis or regurgitation. Moderate tricuspid regurgitation. At least moderate central mitral regurgitation, suspect functional. On delayed enhancement imaging, there was no myocardial late gadolinium enhancement (LGE). Measurements: LVEDV 295 mL LVSV 61 mL LVEF 21% RVEDV 168 mL RVSV 53 mL RVEF 31% IMPRESSION: 1.  Moderately dilated LV with EF 21%, diffuse hypokinesis. 2. Normal RV size with moderately decreased systolic function, EF 61%. 3.  Moderate TR, at least moderate central MR (likely functional). 4. No myocardial LGE, so no evidence for prior  infarct, infiltrative disease, or myocarditis. Dalton Mclean Electronically Signed   By: Loralie Champagne M.D.   On: 12/05/2018 16:18    Scheduled Meds: . aspirin  81 mg Oral QPM  . atorvastatin  80 mg Oral q1800  . cholecalciferol  1,000 Units Oral Daily  . digoxin  0.125 mg Oral Daily  . DULoxetine  60 mg Oral BID  . furosemide  80 mg Oral Daily  . gabapentin  600 mg Oral TID  . ivabradine  5 mg Oral BID WC  . losartan  12.5 mg Oral BID  . pramipexole  0.5 mg Oral QHS  . sodium chloride flush  10-40 mL Intracatheter Q12H  . sodium chloride flush  3 mL Intravenous Q12H  . spironolactone  25 mg Oral Daily  . vitamin C  500 mg Oral Daily    Continuous Infusions: . sodium chloride    . heparin 1,200 Units/hr (12/06/18 4709)     Time spent: 71mins I have personally reviewed and interpreted on  12/06/2018 daily labs, tele strips, imagings as discussed above under date review session and assessment and plans.  I reviewed all nursing notes, pharmacy notes, consultant notes,  vitals, pertinent old records  I have discussed plan of care as described above with RN , patient  on 12/06/2018   Florencia Reasons MD, PhD  [facility] Pager [phone]. If 7PM-7AM, please contact night-coverage at [email or web address], password [email or web address] 12/06/2018, 9:57 AM  LOS: 8 days

## 2018-12-06 NOTE — Progress Notes (Signed)
CARDIAC REHAB PHASE I   PRE:  Rate/Rhythm: 101 ST  BP:  Supine:   Sitting: 94/70  Standing:    SaO2: 95%RA  MODE:  Ambulation: 680 ft   POST:  Rate/Rhythm: 116 ST  BP:  Supine:   Sitting: 140/53  Standing:    SaO2: 91-93%RA  1140-1200 Pt walked 680 ft on RA with steady gait and tolerated well. To recliner and set up lunch.    Graylon Good, RN BSN  12/06/2018 11:57 AM

## 2018-12-06 NOTE — Care Management Important Message (Signed)
Important Message  Patient Details  Name: Jennifer [name] MRN: [id number] Date of Birth: 06/20/1956   Medicare Important Message Given:  Yes    [name] P [place] 12/06/2018, 12:14 PM

## 2018-12-06 NOTE — Progress Notes (Signed)
ANTICOAGULATION CONSULT NOTE  Pharmacy Consult for IV heparin Indication: Hx DVT/PE, holding Xarelto  Patient Measurements: Height: 5\' 2"  (157.5 cm) Weight: 177 lb 4.8 oz (80.4 kg) IBW/kg (Calculated) : 50.1 Heparin Dosing Weight: 68.3 kg  Vital Signs: Temp: 97.9 F (36.6 C) (02/19 0740) Temp Source: Oral (02/19 0740) BP: 92/65 (02/19 0740) Pulse Rate: 102 (02/19 0740)  Labs: Recent Labs    12/03/18 1359  12/04/18 0410  12/04/18 2230 12/05/18 0425 12/05/18 0854 12/05/18 0855 12/06/18 0510  HGB  --    < > 12.3  --   --  12.7 12.6 12.9 14.2  HCT  --    < > 38.6  --   --  40.5 37.0 38.0 46.2*  PLT  --   --  114*  --   --  109*  --   --  113*  APTT 95*  --  118*  --   --   --   --   --   --   HEPARINUNFRC  --   --  0.86*   < > 0.51 0.58  --   --  0.67  CREATININE  --   --  0.87  --   --  0.92  --   --  0.79   < > = values in this interval not displayed.    Estimated Creatinine Clearance: 70.7 mL/min (by C-G formula based on SCr of 0.79 mg/dL).  Medications:  Infusions:  . sodium chloride    . heparin 1,200 Units/hr (12/06/18 1000)    Assessment: 63 yo female on chronic Xarelto for hx DVT/PE. Pharmacy asked to switch to heparin in case mechanical support is needed. CBC stable.   Heparin level therapeutic (0.67) on gtt at 1200 units/hr. No bleeding noted.  Not restarting Xarelto yet in case needs tunneled PICC.   Goal of Therapy:  Heparin level 0.3-0.7 units/ml Monitor platelets by anticoagulation protocol: Yes   Plan:  Continue IV Heparin at current rate. Monitor daily heparin level and CBC, s/sx bleeding  Marguerite Olea, [facility] Clinical Pharmacist Phone [phone]  12/06/2018 11:18 AM

## 2018-12-06 NOTE — Plan of Care (Signed)

## 2018-12-06 NOTE — Progress Notes (Addendum)
Patient ID: Jennifer [name], female   DOB: May 18, 1956, 63 y.o.   MRN: [id number]     Advanced Heart Failure Rounding Note  PCP-Cardiologist: Kate Sable, MD   Subjective:   Events   2-13 Started milrinone 0.25 mcg for low CO-OX  (31%).  Cath yesterday showed well compensated hemodynamics on low dose milrinone. Stopped.   Coox marginal at 53.8% off milrinone this am.   Feeling great this am. Denies CP, lightheadedness or dizziness, or SOB walking around room or halls.   Echo 11/28/2018 EF 10-15% with moderate RV dysfunction.   cMRI 12/05/18 1. Moderately dilated LV with EF 21%, diffuse hypokinesis. 2. Normal RV size with moderately decreased systolic function, EF 41%. 3. Moderate TR, at least moderate central MR (likely functional). 4. No myocardial LGE, so no evidence for prior infarct, infiltrative disease, or myocarditis.  RHC 12/05/18 On milrinone 0.125 RA = 8 RV = 37/10 PA = 39/15 (25) PCW = 19 (v = 35) Fick cardiac output/index = 5.0/2.8 PVR = 1.1 WU Ao sat = 94% PA sat = 66%, 66% PaPi = 3.0 RA/PCW = 0.42  Objective:   Weight Range: 80.4 kg Body mass index is 32.43 kg/m.   Vital Signs:   Temp:  [97.9 F (36.6 C)-98.5 F (36.9 C)] 97.9 F (36.6 C) (02/19 0740) Pulse Rate:  [87-102] 102 (02/19 0740) Resp:  [16-20] 18 (02/19 0600) BP: (92-149)/(49-75) 92/65 (02/19 0740) SpO2:  [91 %-98 %] 95 % (02/19 0740) Last BM Date: 12/05/18  Weight change: Filed Weights   12/03/18 0413 12/04/18 0434 12/05/18 0427  Weight: 79.9 kg 80.9 kg 80.4 kg    Intake/Output:   Intake/Output Summary (Last 24 hours) at 12/06/2018 0933 Last data filed at 12/06/2018 0500 Gross per 24 hour  Intake 293.35 ml  Output 2725 ml  Net -2431.65 ml     Physical Exam    General: Well appearing. No resp difficulty. HEENT: Normal Neck: Supple. JVP 6-7 cm. Carotids 2+ bilat; no bruits. No thyromegaly or nodule noted. Cor: PMI lateral. Tachy. + S3. Lungs: Clear, normal  effort. Abdomen: Soft, non-tender, non-distended, no HSM. No bruits or masses. +BS  Extremities: No cyanosis, clubbing, or rash. R and LLE no edema. RUE PICC Neuro: Alert & orientedx3, cranial nerves grossly intact. moves all 4 extremities w/o difficulty. Affect pleasant   Telemetry   NSR/ST 90-100s, personally reviewed.   EKG    No new tracings.    Labs    CBC Recent Labs    12/05/18 0425  12/05/18 0855 12/06/18 0510  WBC 7.0  --   --  8.4  HGB 12.7   < > 12.9 14.2  HCT 40.5   < > 38.0 46.2*  MCV 102.8*  --   --  103.1*  PLT 109*  --   --  113*   < > = values in this interval not displayed.   Basic Metabolic Panel Recent Labs    12/05/18 0425  12/05/18 0855 12/06/18 0510  NA 139   < > 138 137  K 4.7   < > 4.5 4.3  CL 102  --   --  102  CO2 29  --   --  28  GLUCOSE 115*  --   --  110*  BUN 19  --   --  20  CREATININE 0.92  --   --  0.79  CALCIUM 9.1  --   --  9.3   < > = values in this interval  not displayed.   Liver Function Tests No results for input(s): AST, ALT, ALKPHOS, BILITOT, PROT, ALBUMIN in the last 72 hours. No results for input(s): LIPASE, AMYLASE in the last 72 hours. Cardiac Enzymes No results for input(s): CKTOTAL, CKMB, CKMBINDEX, TROPONINI in the last 72 hours.  BNP: BNP (last 3 results) Recent Labs    11/27/18 2041  BNP 1,311.1*    ProBNP (last 3 results) No results for input(s): PROBNP in the last 8760 hours.   D-Dimer No results for input(s): DDIMER in the last 72 hours. Hemoglobin A1C No results for input(s): HGBA1C in the last 72 hours. Fasting Lipid Panel No results for input(s): CHOL, HDL, LDLCALC, TRIG, CHOLHDL, LDLDIRECT in the last 72 hours. Thyroid Function Tests No results for input(s): TSH, T4TOTAL, T3FREE, THYROIDAB in the last 72 hours.  Invalid input(s): FREET3  Other results:   Imaging    Mr Cardiac Morphology W Wo Contrast  Result Date: 12/05/2018 CLINICAL DATA:  Cardiomyopathy of uncertain etiology  EXAM: CARDIAC MRI TECHNIQUE: The patient was scanned on a 1.5 Tesla GE magnet. A dedicated cardiac coil was used. Functional imaging was done using Fiesta sequences. 2,3, and 4 chamber views were done to assess for RWMA's. Modified Simpson's rule using a short axis stack was used to calculate an ejection fraction on a dedicated work Conservation officer, nature. The patient received 8 cc of Multihance. After 10 minutes inversion recovery sequences were used to assess for infiltration and scar tissue. FINDINGS: The patient is status post sternotomy. Limited images of the lung fields showed no gross abnormalities. Moderate left ventricular dilation with normal wall thickness. Diffuse hypokinesis, EF 21%. No LV thrombus noted. Normal right ventricular size with moderately decreased systolic function, EF 86%. Mild left atrial enlargement. Normal right atrial size. Trileaflet aortic valve, no significant stenosis or regurgitation. Moderate tricuspid regurgitation. At least moderate central mitral regurgitation, suspect functional. On delayed enhancement imaging, there was no myocardial late gadolinium enhancement (LGE). Measurements: LVEDV 295 mL LVSV 61 mL LVEF 21% RVEDV 168 mL RVSV 53 mL RVEF 31% IMPRESSION: 1.  Moderately dilated LV with EF 21%, diffuse hypokinesis. 2. Normal RV size with moderately decreased systolic function, EF 57%. 3.  Moderate TR, at least moderate central MR (likely functional). 4. No myocardial LGE, so no evidence for prior infarct, infiltrative disease, or myocarditis. Dalton Mclean Electronically Signed   By: Loralie Champagne M.D.   On: 12/05/2018 16:18     Medications:     Scheduled Medications: . aspirin  81 mg Oral QPM  . atorvastatin  80 mg Oral q1800  . cholecalciferol  1,000 Units Oral Daily  . digoxin  0.125 mg Oral Daily  . DULoxetine  60 mg Oral BID  . furosemide  80 mg Oral Daily  . gabapentin  600 mg Oral TID  . ivabradine  5 mg Oral BID WC  . losartan  12.5 mg Oral  BID  . pramipexole  0.5 mg Oral QHS  . sodium chloride flush  10-40 mL Intracatheter Q12H  . sodium chloride flush  3 mL Intravenous Q12H  . spironolactone  25 mg Oral Daily  . vitamin C  500 mg Oral Daily    Infusions: . sodium chloride    . heparin 1,200 Units/hr (12/06/18 0838)    PRN Medications: sodium chloride, acetaminophen **OR** acetaminophen, acetaminophen, ondansetron (ZOFRAN) IV, sodium chloride flush, sodium chloride flush    Patient Profile   Jennifer [name] is a 63 y.o. female with a history  of CAD s/p CABG 09/2018, longstanding chronic systolic HF due to NICM 86+ years ago, HTN, HL, and fibromyalgia. Recent diagnosis (1/20) of bilateral PE on xarelto.   Admitted with worsening SOB.   Assessment/Plan   1. A/C systolic HF with new RV failure. Prior hx of NICM x 15+ years, now with ICM with CABG 09/2018. New RV failure secondary to acute bilateral PE in 1/20. - Echo 11/28/18: EF 10-15%, RV moderately reduced, severe functional MR - cMRI 12/05/18 with LVEF 21%, RV moderately down, Moderate TR and at least moderate, functional MR. No LGE.  - Co-ox 53.8% off milrinone. Follow.  - CVP 4-5 on po lasix 80 mg daily. Follow. May be able to cut back.  - Continue digoxin 0.125 mg daily - Continue losartan 12.5 mg BID. BP in 100-110s mostly, but softer this am at 92/65. - Continue spironolactone 25.  - Increase ivabradine to 7.5 mg BID - No b-blocker yet with recent low output.  - Overall still tenuous.  Feels better but HR still very fast. Suspect this is mostly NICM as EF decreased far out of proportion to CAD.  - RHC 2/18 showed well compensated hemodynamics on milrinone. Following wean. Will need to continued to assess for need for home milrinone versus more urgent advanced therapies. Ideally would be best suited with outpatient transplant eval given age and blood type (A pos) - Titrate HF meds as BP tolerates. . Possible need for advanced therapies in future but recent  large PE with RV failure is a complicator. Was on Xarelto for DVT/PE but switched to heparin in case she needs mechanical support in near future.   2. Bilateral PE - Diagnosed at [facility] 10/2018 - Xarelto stopped and heparin started in the event she needs supportive devices. Will plan on Xarelto tomorrow, IF she does not need a tunneled PICC placed.  - Dosing per PharmD.   3. CAD s/p CABG 09/2018 - LIMA to the LAD and an SVG to the OM on 09/22/18 with Dr Roxan Hockey - No s/s of ischemia.    - Continue 81 mg asa.  - Continue statin - No bb with rv failure  4. Severe MR - New diagnosis on echo this admit. Echo at [facility] showed mod to severe MR 10/2018. Mild MR on TEE 09/22/18. 2/20 echo with severe functional MR.  - Follow.  5. Hypokalemia - K 4.3 this am. Follow.   6. Tachycardia - Sinus tachy. Treating HF as above. Stable and improving.   7. ?OSA - Will need repeat sleep study outpatient. No change.   8. Deconditioning - CR following.    Continue to follow off milrinone with marginal coox. Titrate meds as tolerated. May be able to increase losartan if pressures tolerate.   Length of Stay: [address]  Annamaria Helling  12/06/2018, 9:33 AM  Advanced Heart Failure Team Pager [phone] (M-F; 7a - 4p)  Please contact [facility] for night-coverage after hours (4p -7a ) and weekends on [email or web address]  Patient seen and examined with the above-signed Advanced Practice Provider and/or Housestaff. I personally reviewed laboratory data, imaging studies and relevant notes. I independently examined the patient and formulated the important aspects of the plan. I have edited the note to reflect any of my changes or salient points. I have personally discussed the plan with the patient and/or family.  Volume status looks good. Milrinone now off but co-ox dropping. Will need to watch one more day. If co-ox continues to drop will need  tunneled PICC and go home with home  milrinone. Will need outpatient referral to [facility] for possible transplant w/u.   Glori Bickers, MD  4:04 PM

## 2018-12-07 LAB — CBC
HCT: 44.5 % (ref 36.0–46.0)
Hemoglobin: 14 g/dL (ref 12.0–15.0)
MCH: 31.8 pg (ref 26.0–34.0)
MCHC: 31.5 g/dL (ref 30.0–36.0)
MCV: 101.1 fL — ABNORMAL HIGH (ref 80.0–100.0)
Platelets: 107 10*3/uL — ABNORMAL LOW (ref 150–400)
RBC: 4.4 MIL/uL (ref 3.87–5.11)
RDW: 13.3 % (ref 11.5–15.5)
WBC: 7.4 10*3/uL (ref 4.0–10.5)
nRBC: 0 % (ref 0.0–0.2)

## 2018-12-07 LAB — BASIC METABOLIC PANEL
ANION GAP: 10 (ref 5–15)
BUN: 25 mg/dL — ABNORMAL HIGH (ref 8–23)
CO2: 26 mmol/L (ref 22–32)
Calcium: 9 mg/dL (ref 8.9–10.3)
Chloride: 100 mmol/L (ref 98–111)
Creatinine, Ser: 0.93 mg/dL (ref 0.44–1.00)
GFR calc Af Amer: >60 mL/min (ref >60)
GFR calc non Af Amer: >60 mL/min (ref >60)
Glucose, Bld: 113 mg/dL — ABNORMAL HIGH (ref 70–99)
Potassium: 4.6 mmol/L (ref 3.5–5.1)
SODIUM: 136 mmol/L (ref 135–145)

## 2018-12-07 LAB — COOXEMETRY PANEL
Carboxyhemoglobin: 1.4 % (ref 0.5–1.5)
Methemoglobin: 1.6 % — ABNORMAL HIGH (ref 0.0–1.5)
O2 Saturation: 64.8 %
Total hemoglobin: 14.1 g/dL (ref 12.0–16.0)

## 2018-12-07 LAB — HEPARIN LEVEL (UNFRACTIONATED): HEPARIN UNFRACTIONATED: 0.71 [IU]/mL — AB (ref 0.30–0.70)

## 2018-12-07 LAB — MAGNESIUM: MAGNESIUM: 2.6 mg/dL — AB (ref 1.7–2.4)

## 2018-12-07 LAB — TSH: TSH: 2.343 u[IU]/mL (ref 0.350–4.500)

## 2018-12-07 MED ORDER — DIGOXIN 125 MCG PO TABS: 0.1250 mg | ORAL_TABLET | Freq: Every day | ORAL | 0 refills | Status: DC

## 2018-12-07 MED ORDER — RIVAROXABAN 20 MG PO TABS
20.0000 mg | ORAL_TABLET | Freq: Every day | ORAL | Status: DC
  Administered 2018-12-07: 20 mg via ORAL
  Filled 2018-12-07: qty 1

## 2018-12-07 MED ORDER — FUROSEMIDE 80 MG PO TABS: 80.0000 mg | ORAL_TABLET | Freq: Every day | ORAL | 0 refills | Status: DC

## 2018-12-07 MED ORDER — RIVAROXABAN 20 MG PO TABS: 20.0000 mg | ORAL_TABLET | Freq: Every day | ORAL | 0 refills | Status: DC

## 2018-12-07 MED ORDER — IVABRADINE HCL 7.5 MG PO TABS: 7.5000 mg | ORAL_TABLET | Freq: Two times a day (BID) | ORAL | 0 refills | Status: DC

## 2018-12-07 MED ORDER — SPIRONOLACTONE 25 MG PO TABS: 25.0000 mg | ORAL_TABLET | Freq: Every day | ORAL | 0 refills | Status: DC

## 2018-12-07 MED ORDER — CYANOCOBALAMIN 500 MCG PO TABS: 500.0000 ug | ORAL_TABLET | Freq: Every day | ORAL | 0 refills | Status: AC

## 2018-12-07 MED ORDER — ASPIRIN 81 MG PO TBEC: 81.0000 mg | DELAYED_RELEASE_TABLET | Freq: Every evening | ORAL | 0 refills | Status: DC

## 2018-12-07 MED ORDER — LOSARTAN POTASSIUM 25 MG PO TABS: 12.5000 mg | ORAL_TABLET | Freq: Two times a day (BID) | ORAL | 0 refills | Status: DC

## 2018-12-07 MED ORDER — FUROSEMIDE 10 MG/ML IJ SOLN
40.0000 mg | Freq: Once | INTRAMUSCULAR | Status: AC
  Administered 2018-12-07: 40 mg via INTRAVENOUS
  Filled 2018-12-07: qty 4

## 2018-12-07 NOTE — Plan of Care (Signed)

## 2018-12-07 NOTE — Evaluation (Signed)
Physical Therapy One time Evaluation Patient Details Name: Jennifer [name] MRN: [id number] DOB: 03/05/1956 Today's Date: 12/07/2018   History of Present Illness  Jennifer [name] is a 63 y.o. female with a history of CAD s/p CABG 09/2018, longstanding chronic systolic HF due to NICM 76+ years ago, HTN, HL, and fibromyalgia. Recent diagnosis (1/20) of bilateral PE on xarelto. Admit with worsening SOB.   Clinical Impression  Pt admitted with above diagnosis. Pt currently without significant functional limitations and was able to ambulate without device with challenges without LOB.   No skilled PT needs at this time.  Will sign off.    Follow Up Recommendations No PT follow up    Equipment Recommendations  None recommended by PT    Recommendations for Other Services       Precautions / Restrictions Precautions Precautions: None Restrictions Weight Bearing Restrictions: No      Mobility  Bed Mobility Overal bed mobility: Independent                Transfers Overall transfer level: Independent                  Ambulation/Gait Ambulation/Gait assistance: Independent Gait Distance (Feet): 500 Feet Assistive device: None Gait Pattern/deviations: WFL(Within Functional Limits)   Gait velocity interpretation: >2.62 ft/sec, indicative of community ambulatory General Gait Details: No LOB with challenges.  Pt functioning very well.  O2 93% and > on RA.  DOE 0/4 with walk.   Stairs            Wheelchair Mobility    Modified Rankin (Stroke Patients Only)       Balance Overall balance assessment: Independent                               Standardized Balance Assessment Standardized Balance Assessment : Dynamic Gait Index   Dynamic Gait Index Level Surface: Normal Change in Gait Speed: Normal Gait with Horizontal Head Turns: Normal Gait with Vertical Head Turns: Normal Gait and Pivot Turn: Normal Step Over Obstacle: Normal Step Around  Obstacles: Normal Steps: Mild Impairment Total Score: 23       Pertinent Vitals/Pain Pain Assessment: No/denies pain    Home Living Family/patient expects to be discharged to:: Private residence Living Arrangements: Children(lives with daughter and son-in-law) Available Help at Discharge: Family;Available PRN/intermittently Type of Home: House Home Access: Level entry     Home Layout: One level Home Equipment: Cane - single point;Walker - standard;Walker - 4 wheels      Prior Function Level of Independence: Independent               Hand Dominance        Extremity/Trunk Assessment   Upper Extremity Assessment Upper Extremity Assessment: Defer to OT evaluation    Lower Extremity Assessment Lower Extremity Assessment: Overall WFL for tasks assessed    Cervical / Trunk Assessment Cervical / Trunk Assessment: Normal  Communication   Communication: No difficulties  Cognition Arousal/Alertness: Awake/alert Behavior During Therapy: WFL for tasks assessed/performed Overall Cognitive Status: Within Functional Limits for tasks assessed                                        General Comments      Exercises     Assessment/Plan    PT Assessment Patent does not need  any further PT services  PT Problem List         PT Treatment Interventions      PT Goals (Current goals can be found in the Care Plan section)  Acute Rehab PT Goals Patient Stated Goal: to go home today PT Goal Formulation: All assessment and education complete, DC therapy    Frequency     Barriers to discharge        Co-evaluation               AM-PAC PT "6 Clicks" Mobility  Outcome Measure Help needed turning from your back to your side while in a flat bed without using bedrails?: None Help needed moving from lying on your back to sitting on the side of a flat bed without using bedrails?: None Help needed moving to and from a bed to a chair (including a  wheelchair)?: None Help needed standing up from a chair using your arms (e.g., wheelchair or bedside chair)?: None Help needed to walk in hospital room?: None Help needed climbing 3-5 steps with a railing? : None 6 Click Score: 24    End of Session   Activity Tolerance: Patient tolerated treatment well Patient left: in chair;with call bell/phone within reach Nurse Communication: Mobility status PT Visit Diagnosis: Muscle weakness (generalized) (M62.81)    Time: 9977-4142 PT Time Calculation (min) (ACUTE ONLY): 16 min   Charges:   PT Evaluation $PT Eval Low Complexity: [place] Carlean Crowl,PT Acute Rehabilitation Services Pager:  [phone]  Office:  [place] 12/07/2018, 9:23 AM

## 2018-12-07 NOTE — Progress Notes (Signed)
[facility] for xarelto Indication: Hx DVT/PE  Patient Measurements: Height: 5\' 2"  (157.5 cm) Weight: 173 lb 4.5 oz (78.6 kg) IBW/kg (Calculated) : 50.1 Heparin Dosing Weight: 68.3 kg  Vital Signs: Temp: 97.8 F (36.6 C) (02/20 0125) Temp Source: Oral (02/20 0125) BP: 98/66 (02/20 0521) Pulse Rate: 95 (02/20 0523)  Labs: Recent Labs    12/05/18 0425  12/05/18 0855 12/06/18 0510 12/07/18 0449 12/07/18 0450  HGB 12.7   < > 12.9 14.2 14.0  --   HCT 40.5   < > 38.0 46.2* 44.5  --   PLT 109*  --   --  113* 107*  --   HEPARINUNFRC 0.58  --   --  0.67  --  0.71*  CREATININE 0.92  --   --  0.79 0.93  --    < > = values in this interval not displayed.    Estimated Creatinine Clearance: 60.1 mL/min (by C-G formula based on SCr of 0.93 mg/dL).  Medications:  Infusions:  . sodium chloride      Assessment: 63 yo female on chronic Xarelto for hx DVT/PE. Pharmacy asked to switch to heparin in case mechanical support is needed. CBC stable. Plan to change back to xarelto today   Goal of Therapy:  therapeutic anticoagulation Monitor platelets by anticoagulation protocol: Yes   Plan:  Xarelto 20 mg po daily  Thanks for allowing pharmacy to be a part of this patient's care.  Excell Seltzer, PharmD Clinical Pharmacist   12/07/2018 6:16 AM

## 2018-12-07 NOTE — Progress Notes (Signed)
Patient ID: Jennifer [name], female   DOB: 1956-03-22, 63 y.o.   MRN: [id number]     Advanced Heart Failure Rounding Note  PCP-Cardiologist: Kate Sable, MD   Subjective:   Events   2-13 Started milrinone 0.25 mcg for low CO-OX  (31%).  Cath howed well compensated hemodynamics on low dose milrinone. Stopped.   Off milrinone co-ox back up to 65%. CVP 8-9 Feels good  Echo 11/28/2018 EF 10-15% with moderate RV dysfunction.   cMRI 12/05/18 1. Moderately dilated LV with EF 21%, diffuse hypokinesis. 2. Normal RV size with moderately decreased systolic function, EF 76%. 3. Moderate TR, at least moderate central MR (likely functional). 4. No myocardial LGE, so no evidence for prior infarct, infiltrative disease, or myocarditis.  RHC 12/05/18 On milrinone 0.125 RA = 8 RV = 37/10 PA = 39/15 (25) PCW = 19 (v = 35) Fick cardiac output/index = 5.0/2.8 PVR = 1.1 WU Ao sat = 94% PA sat = 66%, 66% PaPi = 3.0 RA/PCW = 0.42  Objective:   Weight Range: 78.6 kg Body mass index is 31.69 kg/m.   Vital Signs:   Temp:  [97.8 F (36.6 C)-98.9 F (37.2 C)] 97.8 F (36.6 C) (02/20 0125) Pulse Rate:  [92-103] 95 (02/20 0523) Resp:  [18-37] 37 (02/20 0523) BP: (72-99)/(54-70) 98/66 (02/20 0521) SpO2:  [94 %-100 %] 96 % (02/20 0523) Weight:  [78.6 kg] 78.6 kg (02/20 0500) Last BM Date: 12/06/18  Weight change: Filed Weights   12/04/18 0434 12/05/18 0427 12/07/18 0500  Weight: 80.9 kg 80.4 kg 78.6 kg    Intake/Output:   Intake/Output Summary (Last 24 hours) at 12/07/2018 0600 Last data filed at 12/07/2018 0500 Gross per 24 hour  Intake 788.26 ml  Output 2550 ml  Net -1761.74 ml     Physical Exam    General:  Well appearing. No resp difficulty HEENT: normal Neck: supple. no JVD. Carotids 2+ bilat; no bruits. No lymphadenopathy or thryomegaly appreciated. Cor: PMI nondisplaced. Regular rate & rhythm. +s3 Lungs: clear Abdomen: soft, nontender, nondistended. No  hepatosplenomegaly. No bruits or masses. Good bowel sounds. Extremities: no cyanosis, clubbing, rash, edema Neuro: alert & orientedx3, cranial nerves grossly intact. moves all 4 extremities w/o difficulty. Affect pleasant  Telemetry   NSR/ST 90-100s, personally reviewed.   EKG    No new tracings.    Labs    CBC Recent Labs    12/06/18 0510 12/07/18 0449  WBC 8.4 7.4  HGB 14.2 14.0  HCT 46.2* 44.5  MCV 103.1* 101.1*  PLT 113* 734*   Basic Metabolic Panel Recent Labs    12/06/18 0510 12/07/18 0449  NA 137 136  K 4.3 4.6  CL 102 100  CO2 28 26  GLUCOSE 110* 113*  BUN 20 25*  CREATININE 0.79 0.93  CALCIUM 9.3 9.0  MG  --  2.6*   Liver Function Tests No results for input(s): AST, ALT, ALKPHOS, BILITOT, PROT, ALBUMIN in the last 72 hours. No results for input(s): LIPASE, AMYLASE in the last 72 hours. Cardiac Enzymes No results for input(s): CKTOTAL, CKMB, CKMBINDEX, TROPONINI in the last 72 hours.  BNP: BNP (last 3 results) Recent Labs    11/27/18 2041  BNP 1,311.1*    ProBNP (last 3 results) No results for input(s): PROBNP in the last 8760 hours.   D-Dimer No results for input(s): DDIMER in the last 72 hours. Hemoglobin A1C No results for input(s): HGBA1C in the last 72 hours. Fasting Lipid Panel No results for  input(s): CHOL, HDL, LDLCALC, TRIG, CHOLHDL, LDLDIRECT in the last 72 hours. Thyroid Function Tests Recent Labs    12/07/18 0449  TSH 2.343    Other results:   Imaging    No results found.   Medications:     Scheduled Medications: . aspirin  81 mg Oral QPM  . atorvastatin  80 mg Oral q1800  . cholecalciferol  1,000 Units Oral Daily  . digoxin  0.125 mg Oral Daily  . DULoxetine  60 mg Oral BID  . furosemide  80 mg Oral Daily  . gabapentin  600 mg Oral TID  . ivabradine  7.5 mg Oral BID WC  . losartan  12.5 mg Oral BID  . pramipexole  0.5 mg Oral QHS  . sodium chloride flush  10-40 mL Intracatheter Q12H  . sodium chloride  flush  3 mL Intravenous Q12H  . spironolactone  25 mg Oral Daily  . vitamin C  500 mg Oral Daily    Infusions: . sodium chloride    . heparin 1,200 Units/hr (12/06/18 1000)    PRN Medications: sodium chloride, acetaminophen **OR** acetaminophen, acetaminophen, ondansetron (ZOFRAN) IV, sodium chloride flush, sodium chloride flush    Patient Profile   Jennifer [name] is a 63 y.o. female with a history of CAD s/p CABG 09/2018, longstanding chronic systolic HF due to NICM 75+ years ago, HTN, HL, and fibromyalgia. Recent diagnosis (1/20) of bilateral PE on xarelto.   Admitted with worsening SOB.   Assessment/Plan   1. A/C systolic HF with new RV failure. Prior hx of NICM x 15+ years, now with ICM with CABG 09/2018. New RV failure secondary to acute bilateral PE in 1/20. - Echo 11/28/18: EF 10-15%, RV moderately reduced, severe functional MR - cMRI 12/05/18 with LVEF 21%, RV moderately down, Moderate TR and at least moderate, functional MR. No LGE.  - Co-ox 65% off milrinone. Follow.  - CVP 8-9 on po lasix 80 mg daily. Follow. May be able to cut back.  - Continue digoxin 0.125 mg daily - Continue losartan 12.5 mg BID. BP 90-100 - Continue spironolactone 25.  - Continue  ivabradine to 7.5 mg BID - No b-blocker yet with recent low output.  - Overall still tenuous.  Feels better but HR still very fast. Suspect this is mostly NICM as EF decreased far out of proportion to CAD.  - RHC 2/18 showed well compensated hemodynamics on milrinone. Following wean. Will need to continued to assess for need for home milrinone versus more urgent advanced therapies. Ideally would be best suited with outpatient transplant eval given age and blood type (A pos)    2. Bilateral PE - Diagnosed at [facility] 10/2018 - Xarelto stopped and heparin started in the event she needs supportive devices.  - co-ox stable start xarelto   3. CAD s/p CABG 09/2018 - LIMA to the LAD and an SVG to the OM on 09/22/18  with Dr Roxan Hockey - No s/s of ischemia.    - Continue 81 mg asa.  - Continue statin - No bb with rv failure  4. Severe MR - New diagnosis on echo this admit. Echo at [facility] showed mod to severe MR 10/2018. Mild MR on TEE 09/22/18. 2/20 echo with severe functional MR.  - Follow.  5. Hypokalemia - K 4.6 this am. Follow.   6. Tachycardia - Sinus tachy. Treating HF as above. Stable and improving.   7. ?OSA - Will need repeat sleep study outpatient. No change.  8. Deconditioning - CR following.    More stable today. Co-ox ok off milrinone. Can go home on above medications. Restart Xarelto prior to d/c. Give one dose IV lasix this am. See back in clinic next week. Pull PICC.    Length of Stay: [place], MD  12/07/2018, 6:00 AM  Advanced Heart Failure Team Pager [phone] (M-F; 7a - 4p)  Please contact [facility] for night-coverage after hours (4p -7a ) and weekends on [email or web address]

## 2018-12-07 NOTE — Discharge Summary (Addendum)
Discharge Summary  Jennifer [name] DTO:[id number] DOB: [birth year]  PCP: Monico Blitz, MD  Admit date: 11/27/2018 Discharge date: 12/07/2018  Time spent: 58mins, more than 50% time spent on coordination of care.  Recommendations for Outpatient Follow-up:  1. F/u with PCP within a week  for hospital discharge follow up, repeat cbc/bmp at follow up. pcp to refer patient to outpatient sleep study to rule out sleep apnea 2. F/u with heart failure clinic 3. F/u with regular cardiology  Discharge Diagnoses:  Active Hospital Problems   Diagnosis Date Noted  . Exertional dyspnea   . Acute on chronic combined systolic and diastolic CHF (congestive heart failure) ([place])   . AKI (acute kidney injury) ([place]) 11/28/2018  . Shortness of breath 11/28/2018  . Bilateral pulmonary embolism ([place]) 11/27/2018  . HTN (hypertension) 10/09/2018  . Dyslipidemia, goal LDL below 70 10/09/2018    Resolved Hospital Problems  No resolved problems to display.    Discharge Condition: stable  Diet recommendation: heart healthy  Filed Weights   12/04/18 0434 12/05/18 0427 12/07/18 0500  Weight: 80.9 kg 80.4 kg 78.6 kg    History of present illness: (per admitting MD Dr Marlowe Sax) PCP: Monico Blitz, MD Patient coming from: Home  Chief Complaint: SOB, cough  HPI: Jennifer [name] is a 63 y.o. female with medical history significant of CAD status post CABG in December 2019, CHF with EF 25 to 30%, GERD, hypertension, hyperlipidemia, IBS, PE on Xarelto presenting to the hospital for evaluation of shortness of breath and cough. Patient was seen by cardiology and sent to the hospital for further evaluation.  She reports 1 week history of dyspnea with minimal exertion and a dry cough.  Denies having any orthopnea or lower extremity edema.  Denies having any chest pain or wheezing.  Reports compliance with home Xarelto.  She has been taking Lasix 40 mg daily for this past week which has somewhat helped with her  dyspnea.  Hospital Course:  Principal Problem:   Exertional dyspnea Active Problems:   HTN (hypertension)   Dyslipidemia, goal LDL below 70   Bilateral pulmonary embolism (HCC)   AKI (acute kidney injury) (HCC)   Shortness of breath   Acute on chronic combined systolic and diastolic CHF (congestive heart failure) (HCC)  Acute on chronic systolic chf and RV failure: -EF down to 10-15%, severe mitral regurgitation. Unlikely to be ischemic cardiomyopathy with recent revascularization. RV failure hopefully to improve with Tx PE -cardiac MRI "1. Moderately dilated LV with EF 21%, diffuse hypokinesis. 2. Normal RV size with moderately decreased systolic function, EF 98%. 3. Moderate TR, at least moderate central MR (likely functional). 4. No myocardial LGE, so no evidence for prior infarct, infiltrative disease, or myocarditis." -S/p right heart cath on 2/18 -she received milrinone drip in the hospital due to low co-ox (31%) initially, she has improved, she is off milrinone, co-ox 65%, picc line removed, she is cleared to discharge home by heart failure team on lasix, spironolactone, dig, losartan, ivabradine - home meds coreg discontinued due to low output heart failure.  -she is to follow up with heart failure team  Bilateral PE Diagnosed at [facility] 10/2018 - Xarelto stopped in the hospital and heparin started in the event she needs supportive devices -no chest pain, no hypoxia -xarelto resumed at discharge  CAD s/p CABG Dec 2019: LIMA to LAD, SVG to OM by Dr. Roxan Hockey.  -denies chest pain - On ASA, statin  Hypokalemia: replaced and normalized. She did not need  extra potassium supplement since started on losartan and spironolactone.  pcp to repeat bmp at hospital discharge follow up.  Depression: Chronic, stable - Continue duloxetine  Macrocytosis:  mcv 101-103 tsh /folate unremarkable b12 low normal, start low dose b12 supplement  FTT; she reports feeling better  and she did well with PT.  Code Status: full  Family Communication: patient   Disposition Plan: home with cardiology clearance    Consultants:  Cardiology /heart failure  Procedures: picc line placement and removal  Right heart Cardiac cath  2/18  Antibiotics:  none   Discharge Exam: BP 95/77 (BP Location: Left Arm)   Pulse (!) 106   Temp 97.8 F (36.6 C) (Oral)   Resp (!) 26   Ht 5\' 2"  (1.575 m)   Wt 78.6 kg   SpO2 95%   BMI 31.69 kg/m   General: NAD Cardiovascular: rrr Respiratory: CTABL Extremity: no edema  Discharge Instructions You were cared for by a hospitalist during your hospital stay. If you have any questions about your discharge medications or the care you received while you were in the hospital after you are discharged, you can call the unit and asked to speak with the hospitalist on call if the hospitalist that took care of you is not available. Once you are discharged, your primary care physician will handle any further medical issues. Please note that NO REFILLS for any discharge medications will be authorized once you are discharged, as it is imperative that you return to your primary care physician (or establish a relationship with a primary care physician if you do not have one) for your aftercare needs so that they can reassess your need for medications and monitor your lab values.  Discharge Instructions    Diet - low sodium heart healthy   Complete by:  As directed    Increase activity slowly   Complete by:  As directed      Allergies as of 12/07/2018      Reactions   Poison Oak Extract Rash      Medication List    STOP taking these medications   carvedilol 6.25 MG tablet Commonly known as:  COREG   hydrochlorothiazide 25 MG tablet Commonly known as:  HYDRODIURIL   potassium chloride SA 20 MEQ tablet Commonly known as:  K-DUR,KLOR-CON     TAKE these medications   aspirin 81 MG EC tablet Take 1 tablet (81 mg total) by  mouth every evening. What changed:    medication strength  how much to take  when to take this   atorvastatin 80 MG tablet Commonly known as:  LIPITOR Take 1 tablet (80 mg total) by mouth daily at 6 PM.   cholecalciferol 25 MCG (1000 UT) tablet Commonly known as:  VITAMIN D3 Take 1,000 Units by mouth daily.   digoxin 0.125 MG tablet Commonly known as:  LANOXIN Take 1 tablet (0.125 mg total) by mouth daily.   DULoxetine 60 MG capsule Commonly known as:  CYMBALTA Take 60 mg by mouth 2 (two) times daily.   furosemide 80 MG tablet Commonly known as:  LASIX Take 1 tablet (80 mg total) by mouth daily. What changed:    medication strength  how much to take  when to take this  reasons to take this   gabapentin 300 MG capsule Commonly known as:  NEURONTIN Take 600 mg by mouth 3 (three) times daily.   ivabradine 7.5 MG Tabs tablet Commonly known as:  CORLANOR Take 1 tablet (7.5  mg total) by mouth 2 (two) times daily with a meal.   losartan 25 MG tablet Commonly known as:  COZAAR Take 0.5 tablets (12.5 mg total) by mouth 2 (two) times daily.   pramipexole 0.5 MG tablet Commonly known as:  MIRAPEX Take 0.5 mg by mouth at bedtime.   rivaroxaban 20 MG Tabs tablet Commonly known as:  XARELTO Take 1 tablet (20 mg total) by mouth daily with breakfast. Start taking on:  December 08, 2018 What changed:    medication strength  how much to take  when to take this   spironolactone 25 MG tablet Commonly known as:  ALDACTONE Take 1 tablet (25 mg total) by mouth daily.   vitamin B-12 500 MCG tablet Commonly known as:  CYANOCOBALAMIN Take 1 tablet (500 mcg total) by mouth daily.   vitamin C 500 MG tablet Commonly known as:  ASCORBIC ACID Take 500 mg by mouth daily.      Allergies  Allergen Reactions  . Poison Oak Extract Rash   Follow-up Information    Monico Blitz, MD Follow up in 1 week(s).   Specialty:  Internal Medicine Why:  hospital discharge follow  up, repeat cbc/bmp at follow up. pcp to refer you to have outpatient sleep study to rule out sleep apnea. Contact information: [address] [place] [address] [phone]        Herminio Commons, MD .   Specialty:  Cardiology Contact information: [place] [address] [phone]            The results of significant diagnostics from this hospitalization (including imaging, microbiology, ancillary and laboratory) are listed below for reference.    Significant Diagnostic Studies: Dg Chest 2 View  Result Date: 11/27/2018 CLINICAL DATA:  Shortness of breath for 4-5 days. EXAM: CHEST - 2 VIEW COMPARISON:  Single-view of the chest and CT chest 11/05/2018. FINDINGS: The patient is status post CABG. There is cardiomegaly. Small right pleural effusion is noted. No left effusion. Lungs are clear. No pneumothorax. No acute bony abnormality. IMPRESSION: Cardiomegaly without edema. Small right pleural effusion. Electronically Signed   By: Inge Rise M.D.   On: 11/27/2018 18:20   Mr Cardiac Morphology W Wo Contrast  Result Date: 12/05/2018 CLINICAL DATA:  Cardiomyopathy of uncertain etiology EXAM: CARDIAC MRI TECHNIQUE: The patient was scanned on a 1.5 Tesla GE magnet. A dedicated cardiac coil was used. Functional imaging was done using Fiesta sequences. 2,3, and 4 chamber views were done to assess for RWMA's. Modified Simpson's rule using a short axis stack was used to calculate an ejection fraction on a dedicated work Conservation officer, nature. The patient received 8 cc of Multihance. After 10 minutes inversion recovery sequences were used to assess for infiltration and scar tissue. FINDINGS: The patient is status post sternotomy. Limited images of the lung fields showed no gross abnormalities. Moderate left ventricular dilation with normal wall thickness. Diffuse hypokinesis, EF 21%. No LV thrombus noted. Normal right ventricular size with moderately decreased systolic  function, EF 02%. Mild left atrial enlargement. Normal right atrial size. Trileaflet aortic valve, no significant stenosis or regurgitation. Moderate tricuspid regurgitation. At least moderate central mitral regurgitation, suspect functional. On delayed enhancement imaging, there was no myocardial late gadolinium enhancement (LGE). Measurements: LVEDV 295 mL LVSV 61 mL LVEF 21% RVEDV 168 mL RVSV 53 mL RVEF 31% IMPRESSION: 1.  Moderately dilated LV with EF 21%, diffuse hypokinesis. 2. Normal RV size with moderately decreased systolic function, EF 63%.  3.  Moderate TR, at least moderate central MR (likely functional). 4. No myocardial LGE, so no evidence for prior infarct, infiltrative disease, or myocarditis. Dalton Mclean Electronically Signed   By: Loralie Champagne M.D.   On: 12/05/2018 16:18   [place] Ekg Site Rite  Result Date: 11/30/2018 If Site Rite image not attached, placement could not be confirmed due to current cardiac rhythm.   Microbiology: Recent Results (from the past 240 hour(s))  MRSA PCR Screening     Status: None   Collection Time: 12/02/18 11:01 AM  Result Value Ref Range Status   MRSA by PCR NEGATIVE NEGATIVE Final    Comment:        The GeneXpert MRSA Assay (FDA approved for NASAL specimens only), is one component of a comprehensive MRSA colonization surveillance program. It is not intended to diagnose MRSA infection nor to guide or monitor treatment for MRSA infections. Performed at [facility], [place] [address], [place], [place] [address]      Labs: Basic Metabolic Panel: Recent Labs  Lab 12/03/18 0440 12/04/18 0410 12/05/18 0425 12/05/18 0854 12/05/18 0855 12/06/18 0510 12/07/18 0449  NA 138 137 139 140 138 137 136  K 4.4 4.0 4.7 4.2 4.5 4.3 4.6  CL 103 103 102  --   --  102 100  CO2 29 26 29   --   --  28 26  GLUCOSE 112* 106* 115*  --   --  110* 113*  BUN 19 19 19   --   --  20 25*  CREATININE 0.76 0.87 0.92  --   --  0.79 0.93  CALCIUM 8.8* 8.5* 9.1   --   --  9.3 9.0  MG 2.3  --   --   --   --   --  2.6*   Liver Function Tests: No results for input(s): AST, ALT, ALKPHOS, BILITOT, PROT, ALBUMIN in the last 168 hours. No results for input(s): LIPASE, AMYLASE in the last 168 hours. No results for input(s): AMMONIA in the last 168 hours. CBC: Recent Labs  Lab 12/03/18 0440 12/04/18 0410 12/05/18 0425 12/05/18 0854 12/05/18 0855 12/06/18 0510 12/07/18 0449  WBC 7.2 6.7 7.0  --   --  8.4 7.4  HGB 13.1 12.3 12.7 12.6 12.9 14.2 14.0  HCT 40.8 38.6 40.5 37.0 38.0 46.2* 44.5  MCV 100.7* 101.8* 102.8*  --   --  103.1* 101.1*  PLT 148* 114* 109*  --   --  113* 107*   Cardiac Enzymes: No results for input(s): CKTOTAL, CKMB, CKMBINDEX, TROPONINI in the last 168 hours. BNP: BNP (last 3 results) Recent Labs    11/27/18 2041  BNP 1,311.1*    ProBNP (last 3 results) No results for input(s): PROBNP in the last 8760 hours.  CBG: No results for input(s): GLUCAP in the last 168 hours.     Signed:  Florencia Reasons MD, PhD  [facility] 12/07/2018, 9:58 AM

## 2018-12-08 ENCOUNTER — Telehealth (HOSPITAL_COMMUNITY): Payer: Self-pay

## 2018-12-08 NOTE — Telephone Encounter (Signed)
PA submitted for Corlanor 7.5mg  per PA [place] via CMM. Awaiting response

## 2018-12-11 LAB — SEROTONIN RELEASE ASSAY (SRA)
SRA .2 IU/mL UFH Ser-aCnc: 98 % — ABNORMAL HIGH (ref 0–20)
SRA 100IU/mL UFH Ser-aCnc: 7 % (ref 0–20)

## 2018-12-11 NOTE — Telephone Encounter (Signed)
Prior authorization through [place] was APPROVED for Corlanor 7.5 mg and will expire on 10/18/2019.

## 2018-12-14 DIAGNOSIS — I2699 Other pulmonary embolism without acute cor pulmonale: Secondary | ICD-10-CM | POA: Diagnosis not present

## 2018-12-14 DIAGNOSIS — Z299 Encounter for prophylactic measures, unspecified: Secondary | ICD-10-CM | POA: Diagnosis not present

## 2018-12-14 DIAGNOSIS — I509 Heart failure, unspecified: Secondary | ICD-10-CM | POA: Diagnosis not present

## 2018-12-14 DIAGNOSIS — I82409 Acute embolism and thrombosis of unspecified deep veins of unspecified lower extremity: Secondary | ICD-10-CM | POA: Diagnosis not present

## 2018-12-14 DIAGNOSIS — Z6832 Body mass index (BMI) 32.0-32.9, adult: Secondary | ICD-10-CM | POA: Diagnosis not present

## 2018-12-14 DIAGNOSIS — I10 Essential (primary) hypertension: Secondary | ICD-10-CM | POA: Diagnosis not present

## 2018-12-14 LAB — COOXEMETRY PANEL

## 2018-12-15 ENCOUNTER — Other Ambulatory Visit (HOSPITAL_COMMUNITY): Payer: Self-pay

## 2018-12-15 ENCOUNTER — Ambulatory Visit (HOSPITAL_COMMUNITY)
Admission: RE | Admit: 2018-12-15 | Discharge: 2018-12-15 | Disposition: A | Discharge status: Home / Self Care | Payer: Medicare PPO | Source: Ambulatory Visit | Attending: Cardiology | Admitting: Cardiology

## 2018-12-15 VITALS — BP 110/78 | HR 91 | Wt 179.8 lb

## 2018-12-15 DIAGNOSIS — Z951 Presence of aortocoronary bypass graft: Secondary | ICD-10-CM | POA: Diagnosis not present

## 2018-12-15 DIAGNOSIS — I25810 Atherosclerosis of coronary artery bypass graft(s) without angina pectoris: Secondary | ICD-10-CM | POA: Insufficient documentation

## 2018-12-15 DIAGNOSIS — M1990 Unspecified osteoarthritis, unspecified site: Secondary | ICD-10-CM | POA: Diagnosis not present

## 2018-12-15 DIAGNOSIS — Z86711 Personal history of pulmonary embolism: Secondary | ICD-10-CM | POA: Insufficient documentation

## 2018-12-15 DIAGNOSIS — I252 Old myocardial infarction: Secondary | ICD-10-CM | POA: Insufficient documentation

## 2018-12-15 DIAGNOSIS — M797 Fibromyalgia: Secondary | ICD-10-CM | POA: Diagnosis not present

## 2018-12-15 DIAGNOSIS — I110 Hypertensive heart disease with heart failure: Secondary | ICD-10-CM | POA: Insufficient documentation

## 2018-12-15 DIAGNOSIS — I2699 Other pulmonary embolism without acute cor pulmonale: Secondary | ICD-10-CM

## 2018-12-15 DIAGNOSIS — Z7901 Long term (current) use of anticoagulants: Secondary | ICD-10-CM | POA: Insufficient documentation

## 2018-12-15 DIAGNOSIS — Z8249 Family history of ischemic heart disease and other diseases of the circulatory system: Secondary | ICD-10-CM | POA: Diagnosis not present

## 2018-12-15 DIAGNOSIS — Z7982 Long term (current) use of aspirin: Secondary | ICD-10-CM | POA: Diagnosis not present

## 2018-12-15 DIAGNOSIS — R9431 Abnormal electrocardiogram [ECG] [EKG]: Secondary | ICD-10-CM | POA: Insufficient documentation

## 2018-12-15 DIAGNOSIS — I340 Nonrheumatic mitral (valve) insufficiency: Secondary | ICD-10-CM | POA: Diagnosis not present

## 2018-12-15 DIAGNOSIS — I5022 Chronic systolic (congestive) heart failure: Secondary | ICD-10-CM | POA: Diagnosis not present

## 2018-12-15 DIAGNOSIS — E7800 Pure hypercholesterolemia, unspecified: Secondary | ICD-10-CM | POA: Diagnosis not present

## 2018-12-15 DIAGNOSIS — Z87891 Personal history of nicotine dependence: Secondary | ICD-10-CM | POA: Diagnosis not present

## 2018-12-15 DIAGNOSIS — I5043 Acute on chronic combined systolic (congestive) and diastolic (congestive) heart failure: Secondary | ICD-10-CM

## 2018-12-15 DIAGNOSIS — Z79899 Other long term (current) drug therapy: Secondary | ICD-10-CM | POA: Insufficient documentation

## 2018-12-15 DIAGNOSIS — I428 Other cardiomyopathies: Secondary | ICD-10-CM | POA: Insufficient documentation

## 2018-12-15 DIAGNOSIS — I255 Ischemic cardiomyopathy: Secondary | ICD-10-CM | POA: Diagnosis not present

## 2018-12-15 DIAGNOSIS — Z833 Family history of diabetes mellitus: Secondary | ICD-10-CM | POA: Insufficient documentation

## 2018-12-15 LAB — BASIC METABOLIC PANEL
Anion gap: 9 (ref 5–15)
BUN: 17 mg/dL (ref 8–23)
CALCIUM: 8.8 mg/dL — AB (ref 8.9–10.3)
CO2: 24 mmol/L (ref 22–32)
CREATININE: 0.85 mg/dL (ref 0.44–1.00)
Chloride: 104 mmol/L (ref 98–111)
GFR calc Af Amer: >60 mL/min (ref >60)
GFR calc non Af Amer: >60 mL/min (ref >60)
Glucose, Bld: 138 mg/dL — ABNORMAL HIGH (ref 70–99)
Potassium: 4.3 mmol/L (ref 3.5–5.1)
Sodium: 137 mmol/L (ref 135–145)

## 2018-12-15 LAB — CBC
HCT: 43.9 % (ref 36.0–46.0)
Hemoglobin: 13.9 g/dL (ref 12.0–15.0)
MCH: 32.4 pg (ref 26.0–34.0)
MCHC: 31.7 g/dL (ref 30.0–36.0)
MCV: 102.3 fL — ABNORMAL HIGH (ref 80.0–100.0)
PLATELETS: 264 10*3/uL (ref 150–400)
RBC: 4.29 MIL/uL (ref 3.87–5.11)
RDW: 12.4 % (ref 11.5–15.5)
WBC: 7.8 10*3/uL (ref 4.0–10.5)
nRBC: 0 % (ref 0.0–0.2)

## 2018-12-15 LAB — DIGOXIN LEVEL: Digoxin Level: 0.3 ng/mL — ABNORMAL LOW (ref 0.8–2.0)

## 2018-12-15 MED ORDER — IVABRADINE HCL 7.5 MG PO TABS: 7.5000 mg | ORAL_TABLET | Freq: Two times a day (BID) | ORAL | 0 refills | Status: DC

## 2018-12-15 MED ORDER — SACUBITRIL-VALSARTAN 24-26 MG PO TABS: 1.0000 | ORAL_TABLET | Freq: Two times a day (BID) | ORAL | 3 refills | Status: DC

## 2018-12-15 MED ORDER — FUROSEMIDE 40 MG PO TABS: 40.0000 mg | ORAL_TABLET | Freq: Every day | ORAL | 3 refills | Status: DC

## 2018-12-15 MED FILL — ENTRESTO 24 MG-26 MG TABLET: 24-26 | 30 days supply | Qty: 60 | Fill #0

## 2018-12-15 NOTE — Patient Instructions (Addendum)
Labs were done today. We will call you with any ABNORMAL results. No news is good news!  EKG was completed.   STOP taking Losartan.  DECREASE Lasix to 40mg  each day.  These medications have been sent to your regular pharmacy.  BEGIN taking Entresto 24/26 mg TWICE A DAY, please begin this medication tonight. This medication has been sent to the [facility].  You have been referred to Cardiac Rehab at [facility], this office will call to schedule an appointment with you.  You have been given 4,  14 tablet 5mg  sample bottle of Corlanor, 1, 7 tablet 20mg  bottle of Xarelto, & a 30 day free trail card for your Entresto.   Your physician recommends that you schedule a follow-up appointment in: 2 weeks and again in 4 weeks.

## 2018-12-15 NOTE — Addendum Note (Signed)
Encounter addended by: Jorge Ny, LCSW on: 12/15/2018 2:50 PM  Actions taken: Clinical Note Signed

## 2018-12-15 NOTE — Progress Notes (Addendum)
CSW consulted to assist patient with medication cost concerns.  Pt reports she has high copays for Corlanor and Xarelto which are not affordable on fixed income.  Pt also given new prescription today for Entresto which will also be expensive for patient.  CSW had patient complete Wynetta Emery and [employer] PAP application for her Xarelto and was able to get patient a grant through the [employer] to cover copays for [employer] and [employer].  Member ID#: [id number] Group#: [id number] PCN: [id number] BIN: [id number] Expires: 12/15/19  Fatima Sanger information provided to pharmacy  Completed xarelto assistance application faxed in for review- fax confirmation received  CSW will continue to follow and assist as needed  Jorge Ny, [place] Worker [facility] [phone]

## 2018-12-15 NOTE — Progress Notes (Signed)
PCP: Dr Manuella Ghazi  Primary  HF Cardiologist: Dr Haroldine Laws   HPI: Jennifer [name] is a 63 y.o. female with a history of NICM 15 years ago with EF 40-45%. She was previously followed by Dr Domenic Polite. She was first seen by Dr Bronson Ing 11/07/19with chest pain x1 year. Exercise Myoview study done November 15thwas high risk. Echocardiogram November 21stshowed severe LV dysfunction with an EF of 20-25%. Catheterization done12/05/19revealed 90% proximal LAD and a 50% circumflex stenosis. She underwent bypass grafting x2 with an L IMA to the LAD and an SVG to the OM on 09/22/18 with Dr Roxan Hockey.   She was recently admitted to [facility] 10/2018 with DVT and bilateral PE. Started on Xarelto. She was also diuresed with IV lasix. Echo done at that time showed EF 10-15%, RV mildly reduced.  She saw Dr Bronson Ing 11/08/18 and was hypotensive and fatigued, so her losartan was stopped.   She was seen 11/27/18 by Kerin Ransom with new SOB with minimal exertion x 2-3 days. She was sent to ED for further evaluation with concerns for elevated PA pressures/RV failure secondary to recent PE. She had low-output HF in the setting of mixed ischemic and nonischemic CM with biventricular dysfunction. PICC placed and co-ox 30% confirming cardiogenic shock physiology. Placed on short term milrinone and diuresed IV lasix. As she improved milrinone was weaned off. She underwent RHC and CMRI as noted below. Discharge weight 173 pounds.   Today she returns for post hospital follow up. Overall feeling much better. Denies SOB/PND/Orthopnea. No chest pain. She has not walked up steps or inclines but has no problems with flat surface. Appetite ok. No fever or chills. Weight at home 171-175  pounds. Taking all medications but she will have a hard time paying for corlanor and xarelto.    Echo  11/28/2018 EF 10-15% with moderate RV dysfunction.   cMRI 12/05/18 1. Moderately dilated LV with EF 21%, diffuse hypokinesis. 2.  Normal RV size with moderately decreased systolic function, EF 96%. 3. Moderate TR, at least moderate central MR (likely functional). 4. No myocardial LGE, so no evidence for prior infarct, infiltrative disease, or myocarditis.  RHC 12/05/18 On milrinone 0.125 RA = 8 RV = 37/10 PA = 39/15 (25) PCW = 19 (v = 35) Fick cardiac output/index = 5.0/2.8 PVR = 1.1 WU Ao sat = 94% PA sat = 66%, 66% PaPi = 3.0 RA/PCW = 0.42   ROS: All systems negative except as listed in HPI, PMH and Problem List.  SH:  Social History   Socioeconomic History  . Marital status: Widowed    Spouse name: Not on file  . Number of children: Not on file  . Years of education: Not on file  . Highest education level: Not on file  Occupational History  . Not on file  Social Needs  . Financial resource strain: Not on file  . Food insecurity:    Worry: Not on file    Inability: Not on file  . Transportation needs:    Medical: Not on file    Non-medical: Not on file  Tobacco Use  . Smoking status: Former Smoker    Packs/day: 1.00    Years: 30.00    Pack years: 30.00    Types: Cigarettes    Last attempt to quit: 1992    Years since quitting: 28.1  . Smokeless tobacco: Never Used  Substance and Sexual Activity  . Alcohol use: Not Currently    Frequency: Never  . Drug use:  Never  . Sexual activity: Not Currently  Lifestyle  . Physical activity:    Days per week: Not on file    Minutes per session: Not on file  . Stress: Not on file  Relationships  . Social connections:    Talks on phone: Not on file    Gets together: Not on file    Attends religious service: Not on file    Active member of club or organization: Not on file    Attends meetings of clubs or organizations: Not on file    Relationship status: Not on file  . Intimate partner violence:    Fear of current or ex partner: Not on file    Emotionally abused: Not on file    Physically abused: Not on file    Forced sexual activity: Not  on file  Other Topics Concern  . Not on file  Social History Narrative  . Not on file    FH:  Family History  Problem Relation Age of Onset  . Diabetes Mellitus II Mother   . Heart disease Mother 13  . Colon cancer Father   . Alcohol abuse Father   . Depression Father   . Congestive Heart Failure Brother 5       probably dilated cardiomyopathy after PNA    Past Medical History:  Diagnosis Date  . Arthritis    "probably; maybe in my hands" (09/21/2018)  . Congestive heart failure (CHF) ([place])   . Dyspnea   . Fibromyalgia   . GERD (gastroesophageal reflux disease)   . Hypercholesteremia   . Hypertension   . IBS (irritable bowel syndrome)   . Mild sleep apnea    no CPAP (09/21/2018)  . Myocardial infarction [facility])    "was told I have had one; never knew about it" (09/21/2018)  . Nonischemic cardiomyopathy (HCC)    ECHO EF 40-45%, Septal Thickness,and mild global left ventricular dysfunction; stress test 03/2004 +VE, refersible defect; ECHO 03/2005 @[facility]  EF 55%  . Pulmonary embolus [facility])     Current Outpatient Medications  Medication Sig Dispense Refill  . aspirin EC 81 MG EC tablet Take 1 tablet (81 mg total) by mouth every evening. 30 tablet 0  . atorvastatin (LIPITOR) 80 MG tablet Take 1 tablet (80 mg total) by mouth daily at 6 PM. 30 tablet 1  . cholecalciferol (VITAMIN D3) 25 MCG (1000 UT) tablet Take 1,000 Units by mouth daily.    . digoxin (LANOXIN) 0.125 MG tablet Take 1 tablet (0.125 mg total) by mouth daily. 30 tablet 0  . DULoxetine (CYMBALTA) 60 MG capsule Take 60 mg by mouth 2 (two) times daily.    . furosemide (LASIX) 80 MG tablet Take 1 tablet (80 mg total) by mouth daily. 30 tablet 0  . gabapentin (NEURONTIN) 300 MG capsule Take 600 mg by mouth 3 (three) times daily.    . ivabradine (CORLANOR) 7.5 MG TABS tablet Take 1 tablet (7.5 mg total) by mouth 2 (two) times daily with a meal. 60 tablet 0  . losartan (COZAAR) 25 MG tablet Take 0.5 tablets (12.5 mg total)  by mouth 2 (two) times daily. 30 tablet 0  . pramipexole (MIRAPEX) 0.5 MG tablet Take 0.5 mg by mouth at bedtime.     . rivaroxaban (XARELTO) 20 MG TABS tablet Take 1 tablet (20 mg total) by mouth daily with breakfast. 30 tablet 0  . spironolactone (ALDACTONE) 25 MG tablet Take 1 tablet (25 mg total) by mouth daily. 30 tablet 0  .  vitamin B-12 (CYANOCOBALAMIN) 500 MCG tablet Take 1 tablet (500 mcg total) by mouth daily. 30 tablet 0  . vitamin C (ASCORBIC ACID) 500 MG tablet Take 500 mg by mouth daily.     No current facility-administered medications for this encounter.     Vitals:   12/15/18 0840  BP: 110/78  Pulse: 91  SpO2: 96%  Weight: 81.6 kg (179 lb 12.8 oz)   Wt Readings from Last 3 Encounters:  12/15/18 81.6 kg (179 lb 12.8 oz)  12/07/18 78.6 kg (173 lb 4.5 oz)  11/27/18 81.8 kg (180 lb 6.4 oz)    PHYSICAL EXAM: General:  Well appearing. No resp difficulty HEENT: normal Neck: supple. JVP 5-6 . Carotids 2+ bilaterally; no bruits. No lymphadenopathy or thryomegaly appreciated. Cor: PMI normal. Regular rate & rhythm. No rubs, gallops or murmurs. Lungs: clear Abdomen: soft, nontender, nondistended. No hepatosplenomegaly. No bruits or masses. Good bowel sounds. Extremities: no cyanosis, clubbing, rash, edema Neuro: alert & orientedx3, cranial nerves grossly intact. Moves all 4 extremities w/o difficulty. Affect pleasant.   ECG: NSR 85 bpm    ASSESSMENT & PLAN: 1. Chronic systolic HF with new RV failure. Prior hx of NICM x 15+ years, now with ICM with CABG 09/2018. New RV failure secondary to acute bilateral PE in 1/20. -Echo 11/28/18: EF 10-15%, RV moderately reduced, severe functional MR - cMRI 12/05/18 with LVEF 21%, RV moderately down, Moderate TR and at least moderate, functional MR. No LGE.  -Plan to repeat ECHO in 3 months after HF meds optimized - NYHA II. Volume status stable. Cut back lasix to 40 mg daily with the addition of entresto.   - Continue digoxin 0.125  mg daily - Stop losartan and start entresto 24-26 mg twice a day. She will take first dose of entresto tonight.  - Continue spironolactone 25 mg daily.   - Continue  ivabradine to 7.5 mg BID. Today she was provided with samples of corlanor.  - No b-blocker yet with recent low output.  - Can refer to [facility] for possible advanced therapies if EF remains low.    2. Bilateral PE - Diagnosed at [facility] 10/2018 - Continue xarelto.  - Referred to SW for Xarelto.   3. CAD s/p CABG 09/2018 -LIMA to the LAD and an SVG to the OM on 12/6/19with Dr Roxan Hockey -No S/S ischemia - Continue 81 mg asa.  - Continue statin - No bb with rv failure  4. Severe MR - New diagnosis on recent echo  -Echo at [facility] showed mod to severe MR 10/2018. - Mild MR on TEE 09/22/18. 2/20 echo with severe functional MR.  - repeat ECHO in May   5. ?OSA - Will need repeat sleep study outpatient. Set up next visit.    Check CBC, BMET today. Follow up in 2 weeks to optimized HF meds. Discussed purpose of HF clinic and that we will continue to adjust medications over the next few months. She was instructed to call the HF clinic if she develops shortness of breath or fatigue.   Refer to [facility] for cardiac rehab. She was referred to HFSW for medication assistance.  Namiah Dunnavant NP-C  12:55 PM

## 2018-12-20 ENCOUNTER — Telehealth (HOSPITAL_COMMUNITY): Payer: Self-pay

## 2018-12-20 NOTE — Telephone Encounter (Signed)
PA for Xarelto 20mg  initiated via CMM.

## 2018-12-27 ENCOUNTER — Ambulatory Visit: Payer: Medicare PPO | Admitting: Student

## 2018-12-28 NOTE — Progress Notes (Signed)
PCP: Dr Manuella Ghazi  Primary  HF Cardiologist: Dr Haroldine Laws   HPI: Jennifer [name] is a 63 y.o. female with a history of NICM 15 years ago with EF 40-45%. She was previously followed by Dr Domenic Polite. She was first seen by Dr Bronson Ing 11/07/19with chest pain x1 year. Exercise Myoview study done November 15thwas high risk. Echocardiogram November 21stshowed severe LV dysfunction with an EF of 20-25%. Catheterization done12/05/19revealed 90% proximal LAD and a 50% circumflex stenosis. She underwent bypass grafting x2 with an L IMA to the LAD and an SVG to the OM on 09/22/18 with Dr Roxan Hockey.   She was recently admitted to [facility] 10/2018 with DVT and bilateral PE. Started on Xarelto. She was also diuresed with IV lasix. Echo done at that time showed EF 10-15%, RV mildly reduced.  She saw Dr Bronson Ing 11/08/18 and was hypotensive and fatigued, so her losartan was stopped.   She was seen 11/27/18 by Kerin Ransom with new SOB with minimal exertion x 2-3 days. She was sent to ED for further evaluation with concerns for elevated PA pressures/RV failure secondary to recent PE. She had low-output HF in the setting of mixed ischemic and nonischemic CM with biventricular dysfunction. PICC placed and co-ox 30% confirming cardiogenic shock physiology. Placed on short term milrinone and diuresed IV lasix. As she improved milrinone was weaned off. She underwent RHC and CMRI as noted below. Discharge weight 173 pounds.   She returns today for 2 week follow up. Last visit started on Entresto and lasix was decreased. She was also referred to cardiac rehab. [employer] is now paying for her corlanor and Entresto. PA denied for xarelto, but J&J assistance still pending. Overall doing okay. She has had a few episodes of SOB when walking to her car. She has associated chest heaviness that comes after she is SOB. It does not feel like previous anginal symptoms. No SOB with ADLs and chores around the house. No  orthopnea, PND, or edema. Weights up and down 176-179 lbs. When she is 179 lbs, she notices bloating and poor appetite. No dizziness. Taking all medications. She has plenty of xarelto currently. Has to pick up corlanor today, but has been taking samples at home. Limits fluid and salt intake. SBP 120s on home checks.   Echo  11/28/2018 EF 10-15% with moderate RV dysfunction.   cMRI 12/05/18 1. Moderately dilated LV with EF 21%, diffuse hypokinesis. 2. Normal RV size with moderately decreased systolic function, EF 09%. 3. Moderate TR, at least moderate central MR (likely functional). 4. No myocardial LGE, so no evidence for prior infarct, infiltrative disease, or myocarditis.  RHC 12/05/18 On milrinone 0.125 RA = 8 RV = 37/10 PA = 39/15 (25) PCW = 19 (v = 35) Fick cardiac output/index = 5.0/2.8 PVR = 1.1 WU Ao sat = 94% PA sat = 66%, 66% PaPi = 3.0 RA/PCW = 0.42  Review of systems complete and found to be negative unless listed in HPI.   SH:  Social History   Socioeconomic History  . Marital status: Widowed    Spouse name: Not on file  . Number of children: Not on file  . Years of education: Not on file  . Highest education level: Not on file  Occupational History  . Not on file  Social Needs  . Financial resource strain: Not on file  . Food insecurity:    Worry: Not on file    Inability: Not on file  . Transportation needs:  Medical: Not on file    Non-medical: Not on file  Tobacco Use  . Smoking status: Former Smoker    Packs/day: 1.00    Years: 30.00    Pack years: 30.00    Types: Cigarettes    Last attempt to quit: 1992    Years since quitting: 28.2  . Smokeless tobacco: Never Used  Substance and Sexual Activity  . Alcohol use: Not Currently    Frequency: Never  . Drug use: Never  . Sexual activity: Not Currently  Lifestyle  . Physical activity:    Days per week: Not on file    Minutes per session: Not on file  . Stress: Not on file  Relationships   . Social connections:    Talks on phone: Not on file    Gets together: Not on file    Attends religious service: Not on file    Active member of club or organization: Not on file    Attends meetings of clubs or organizations: Not on file    Relationship status: Not on file  . Intimate partner violence:    Fear of current or ex partner: Not on file    Emotionally abused: Not on file    Physically abused: Not on file    Forced sexual activity: Not on file  Other Topics Concern  . Not on file  Social History Narrative  . Not on file    FH:  Family History  Problem Relation Age of Onset  . Diabetes Mellitus II Mother   . Heart disease Mother 95  . Colon cancer Father   . Alcohol abuse Father   . Depression Father   . Congestive Heart Failure Brother 34       probably dilated cardiomyopathy after PNA    Past Medical History:  Diagnosis Date  . Arthritis    "probably; maybe in my hands" (09/21/2018)  . Congestive heart failure (CHF) ([place])   . Dyspnea   . Fibromyalgia   . GERD (gastroesophageal reflux disease)   . Hypercholesteremia   . Hypertension   . IBS (irritable bowel syndrome)   . Mild sleep apnea    no CPAP (09/21/2018)  . Myocardial infarction [facility])    "was told I have had one; never knew about it" (09/21/2018)  . Nonischemic cardiomyopathy (HCC)    ECHO EF 40-45%, Septal Thickness,and mild global left ventricular dysfunction; stress test 03/2004 +VE, refersible defect; ECHO 03/2005 @[facility]  EF 55%  . Pulmonary embolus [facility])     Current Outpatient Medications  Medication Sig Dispense Refill  . aspirin EC 81 MG EC tablet Take 1 tablet (81 mg total) by mouth every evening. 30 tablet 0  . atorvastatin (LIPITOR) 80 MG tablet Take 1 tablet (80 mg total) by mouth daily at 6 PM. 30 tablet 1  . cholecalciferol (VITAMIN D3) 25 MCG (1000 UT) tablet Take 1,000 Units by mouth daily.    . digoxin (LANOXIN) 0.125 MG tablet Take 1 tablet (0.125 mg total) by mouth daily. 30 tablet  0  . DULoxetine (CYMBALTA) 60 MG capsule Take 60 mg by mouth 2 (two) times daily.    . furosemide (LASIX) 40 MG tablet Take 1 tablet (40 mg total) by mouth daily. 90 tablet 3  . gabapentin (NEURONTIN) 300 MG capsule Take 600 mg by mouth 3 (three) times daily.    . ivabradine (CORLANOR) 7.5 MG TABS tablet Take 1 tablet (7.5 mg total) by mouth 2 (two) times daily with a meal. 60  tablet 0  . pramipexole (MIRAPEX) 0.5 MG tablet Take 0.5 mg by mouth at bedtime.     . rivaroxaban (XARELTO) 20 MG TABS tablet Take 1 tablet (20 mg total) by mouth daily with breakfast. 30 tablet 0  . sacubitril-valsartan (ENTRESTO) 24-26 MG Take 1 tablet by mouth 2 (two) times daily. 60 tablet 3  . spironolactone (ALDACTONE) 25 MG tablet Take 1 tablet (25 mg total) by mouth daily. 30 tablet 0  . vitamin B-12 (CYANOCOBALAMIN) 500 MCG tablet Take 1 tablet (500 mcg total) by mouth daily. 30 tablet 0  . vitamin C (ASCORBIC ACID) 500 MG tablet Take 500 mg by mouth daily.     No current facility-administered medications for this encounter.     Vitals:   12/29/18 0852  BP: 112/76  Pulse: 79  SpO2: 99%  Weight: 83.3 kg (183 lb 9.6 oz)   Wt Readings from Last 3 Encounters:  12/29/18 83.3 kg (183 lb 9.6 oz)  12/15/18 81.6 kg (179 lb 12.8 oz)  12/07/18 78.6 kg (173 lb 4.5 oz)    PHYSICAL EXAM: General: Well appearing. No resp difficulty. HEENT: Normal, mild periorbital edema Neck: Supple. JVP 7-8. Carotids 2+ bilat; no bruits. No thyromegaly or nodule noted. Cor: PMI nondisplaced. RRR, No M/G/R noted Lungs: CTAB, normal effort. Abdomen: Soft, non-tender, non-distended, no HSM. No bruits or masses. +BS  Extremities: No cyanosis, clubbing, or rash. R and LLE no edema.  Neuro: Alert & orientedx3, cranial nerves grossly intact. moves all 4 extremities w/o difficulty. Affect pleasant  EKG: NSR 77 bpm. Looks similar to previous. Personally reviewed.   ASSESSMENT & PLAN: 1. Chronic systolic HF with new RV failure.  Prior hx of NICM x 15+ years, now with ICM with CABG 09/2018. New RV failure secondary to acute bilateral PE in 1/20. -Echo 11/28/18: EF 10-15%, RV moderately reduced, severe functional MR - cMRI 12/05/18 with LVEF 21%, RV moderately down, Moderate TR and at least moderate, functional MR. No LGE.  - NYHA II. Volume status mildly elevated on exam. Up 4 lbs. Continue lasix 40 mg daily. Take additional 40 mg PRN. Discussed sliding scale diuretics.  - Continue digoxin 0.125 mg daily. Dig level 0.3 on 2/28.  - Increase Entresto 49/51 mg BID. BMET today and in 2 weeks at follow up. She will continue to monitor BP at home.  - Continue spironolactone 25 mg daily.   - Continue ivabradine to 7.5 mg BID.  - No b-blocker yet with recent low output.  - Can refer to [facility] for possible advanced therapies if EF remains low.  - Repeat echo in May. Will set up today.  - Cardiac rehab orientation is 3/23. Starts in April.    2. Bilateral PE - Diagnosed at [facility] 10/2018 - Continue xarelto. PA denied. Will follow up on J&J assistance with CS[employer]. Denies bleeding.   3. CAD s/p CABG 09/2018 -LIMA to the LAD and an SVG to the OM on 12/6/19with Dr Roxan Hockey - Having some chest "heaviness, not pain" that comes when she gets SOB. Does not feel similar to previous anginal pain. EKG looks unchanged.  - Continue 81 mg asa.  - Continue statin - No bb with rv failure  4. Severe MR - New diagnosis on recent echo  - Echo at [facility] showed mod to severe MR 10/2018. - Mild MR on TEE 09/22/18. 2/20 echo with severe functional MR.  - repeat ECHO in May. Set up today.   5. ?OSA - Very mild  in past.  - Refer for repeat sleep study today. Insurance declined due to low score on Epworth sleepiness scale.   Keep follow up in 2 weeks for ongoing med titration and BMET. Increase Entresto as above. Discussed PRN lasix. Scheduled for echo in May.   Georgiana Shore NP-C  9:24 AM  Greater than 50% of the 25  minute visit was spent in counseling/coordination of care regarding disease state education, salt/fluid restriction, sliding scale diuretics, and medication compliance.

## 2018-12-29 ENCOUNTER — Ambulatory Visit (HOSPITAL_COMMUNITY)
Admission: RE | Admit: 2018-12-29 | Discharge: 2018-12-29 | Disposition: A | Discharge status: Home / Self Care | Payer: Medicare PPO | Source: Ambulatory Visit | Attending: Cardiology | Admitting: Cardiology

## 2018-12-29 ENCOUNTER — Telehealth (HOSPITAL_COMMUNITY): Payer: Self-pay | Admitting: *Deleted

## 2018-12-29 ENCOUNTER — Other Ambulatory Visit: Payer: Self-pay

## 2018-12-29 ENCOUNTER — Telehealth (HOSPITAL_COMMUNITY): Payer: Self-pay

## 2018-12-29 ENCOUNTER — Encounter (HOSPITAL_COMMUNITY): Payer: Self-pay

## 2018-12-29 ENCOUNTER — Other Ambulatory Visit (HOSPITAL_COMMUNITY): Payer: Self-pay | Admitting: *Deleted

## 2018-12-29 VITALS — BP 112/76 | HR 79 | Wt 183.6 lb

## 2018-12-29 DIAGNOSIS — Z8249 Family history of ischemic heart disease and other diseases of the circulatory system: Secondary | ICD-10-CM | POA: Diagnosis not present

## 2018-12-29 DIAGNOSIS — I110 Hypertensive heart disease with heart failure: Secondary | ICD-10-CM | POA: Diagnosis not present

## 2018-12-29 DIAGNOSIS — Z800 Family history of malignant neoplasm of digestive organs: Secondary | ICD-10-CM | POA: Diagnosis not present

## 2018-12-29 DIAGNOSIS — Z7901 Long term (current) use of anticoagulants: Secondary | ICD-10-CM | POA: Diagnosis not present

## 2018-12-29 DIAGNOSIS — I252 Old myocardial infarction: Secondary | ICD-10-CM | POA: Insufficient documentation

## 2018-12-29 DIAGNOSIS — Z79899 Other long term (current) drug therapy: Secondary | ICD-10-CM | POA: Insufficient documentation

## 2018-12-29 DIAGNOSIS — Z951 Presence of aortocoronary bypass graft: Secondary | ICD-10-CM | POA: Insufficient documentation

## 2018-12-29 DIAGNOSIS — Z833 Family history of diabetes mellitus: Secondary | ICD-10-CM | POA: Diagnosis not present

## 2018-12-29 DIAGNOSIS — Z7982 Long term (current) use of aspirin: Secondary | ICD-10-CM | POA: Insufficient documentation

## 2018-12-29 DIAGNOSIS — M797 Fibromyalgia: Secondary | ICD-10-CM | POA: Insufficient documentation

## 2018-12-29 DIAGNOSIS — I340 Nonrheumatic mitral (valve) insufficiency: Secondary | ICD-10-CM | POA: Diagnosis not present

## 2018-12-29 DIAGNOSIS — K589 Irritable bowel syndrome without diarrhea: Secondary | ICD-10-CM | POA: Diagnosis not present

## 2018-12-29 DIAGNOSIS — I428 Other cardiomyopathies: Secondary | ICD-10-CM | POA: Diagnosis not present

## 2018-12-29 DIAGNOSIS — I2699 Other pulmonary embolism without acute cor pulmonale: Secondary | ICD-10-CM | POA: Diagnosis not present

## 2018-12-29 DIAGNOSIS — R9431 Abnormal electrocardiogram [ECG] [EKG]: Secondary | ICD-10-CM | POA: Insufficient documentation

## 2018-12-29 DIAGNOSIS — I5022 Chronic systolic (congestive) heart failure: Secondary | ICD-10-CM | POA: Insufficient documentation

## 2018-12-29 DIAGNOSIS — I25708 Atherosclerosis of coronary artery bypass graft(s), unspecified, with other forms of angina pectoris: Secondary | ICD-10-CM | POA: Diagnosis not present

## 2018-12-29 DIAGNOSIS — E7800 Pure hypercholesterolemia, unspecified: Secondary | ICD-10-CM | POA: Diagnosis not present

## 2018-12-29 DIAGNOSIS — Z87891 Personal history of nicotine dependence: Secondary | ICD-10-CM | POA: Insufficient documentation

## 2018-12-29 DIAGNOSIS — Z86711 Personal history of pulmonary embolism: Secondary | ICD-10-CM | POA: Insufficient documentation

## 2018-12-29 DIAGNOSIS — I25810 Atherosclerosis of coronary artery bypass graft(s) without angina pectoris: Secondary | ICD-10-CM | POA: Insufficient documentation

## 2018-12-29 LAB — BASIC METABOLIC PANEL
Anion gap: 11 (ref 5–15)
BUN: 18 mg/dL (ref 8–23)
CO2: 23 mmol/L (ref 22–32)
Calcium: 9 mg/dL (ref 8.9–10.3)
Chloride: 106 mmol/L (ref 98–111)
Creatinine, Ser: 0.99 mg/dL (ref 0.44–1.00)
GFR calc Af Amer: >60 mL/min (ref >60)
GFR calc non Af Amer: >60 mL/min (ref >60)
Glucose, Bld: 101 mg/dL — ABNORMAL HIGH (ref 70–99)
Potassium: 4.4 mmol/L (ref 3.5–5.1)
Sodium: 140 mmol/L (ref 135–145)

## 2018-12-29 LAB — BRAIN NATRIURETIC PEPTIDE: B Natriuretic Peptide: 1446.4 pg/mL — ABNORMAL HIGH (ref 0.0–100.0)

## 2018-12-29 MED ORDER — SACUBITRIL-VALSARTAN 49-51 MG PO TABS: 1.0000 | ORAL_TABLET | Freq: Two times a day (BID) | ORAL | 11 refills | Status: DC

## 2018-12-29 MED ORDER — FUROSEMIDE 40 MG PO TABS: 40.0000 mg | ORAL_TABLET | Freq: Every day | ORAL | 3 refills | Status: DC

## 2018-12-29 MED FILL — CORLANOR 7.5 MG TABLET: 7.5 | 30 days supply | Qty: 60 | Fill #0 | Status: TO

## 2018-12-29 NOTE — Telephone Encounter (Signed)
Completed PA for pt's Entresto 49/51 mg tabs, APPROVED (case # [id number]) med approved 12/29/2018 through 10/18/2019.  Info sent to [place]

## 2018-12-29 NOTE — Telephone Encounter (Signed)
Pt aware of results and recommendations. Pt will take an extra dose of lasix 40mg  today and tomorrow. Verbalized understanding.

## 2018-12-29 NOTE — Patient Instructions (Signed)
Lab work done today. We will notify you of any abnormal lab work.  INCREASE Entresto 49-51 (1 tab) two times a day.  May take Furosemide 40mg  (1 tab) extra as needed for weight gain of  >3lbs in one day or >5lbs in 1 week.  Your physician has requested that you have an echocardiogram. Echocardiography is a painless test that uses sound waves to create images of your heart. It provides your doctor with information about the size and shape of your heart and how well your heart's chambers and valves are working. This procedure takes approximately one hour. There are no restrictions for this procedure. This echocardiogram will be done at your appointment with Dr. Haroldine Laws.   Follow up with Dr. Haroldine Laws in 8 weeks.

## 2018-12-29 NOTE — Telephone Encounter (Signed)
-----   Message from Georgiana Shore, NP sent at 12/29/2018 11:27 AM EDT ----- Labs are stable. BNP elevated. Have her take additional 40 mg lasix today and tomorrow. We discussed taking extra in clinic today. Does not need extra K. Thanks!

## 2019-01-01 ENCOUNTER — Ambulatory Visit: Payer: PRIVATE HEALTH INSURANCE

## 2019-01-01 DIAGNOSIS — Z515 Encounter for palliative care: Secondary | ICD-10-CM

## 2019-01-01 DIAGNOSIS — E782 Mixed hyperlipidemia: Secondary | ICD-10-CM

## 2019-01-01 DIAGNOSIS — F5101 Primary insomnia: Secondary | ICD-10-CM

## 2019-01-01 NOTE — H&P
PATIENT:  Kerry Baxter   MRN:  [id number]  DOB:  08-02-1956  DATE OF SERVICE:  01/01/2019    PRIMARY CARE PROVIDER: Gregary Cromer., MD    CC:   Chief Complaint   Patient presents with   ??? Annual Exam        Subjective:     HPI: Kerry Baxter is a 63 y.o. female presenting today for     [facility] WELLNESS VISIT/ REVIEW OF ALL CHRONIC MEDICAL CONDITIONS      No additional contributory past medical, surgical, family or social history except as documented in care connect.   ROS: 14-Review of Systems - negative except for nasal congestion    Allergies   Allergen Reactions   ??? Garlic    ??? Sulfa Antibiotics      Rash         Medications:  Outpatient Medications Prior to Visit   Medication Sig   ??? fluticasone 50 mcg/act nasal spray Spray 2 sprays in each nare daily.   ??? lorazepam 1 mg tablet Take 1 tablet (1 mg total) by mouth at bedtime. Max Daily Amount: 1 mg (Patient not taking: Reported on 12/26/2017.)     No facility-administered medications prior to visit.          Past Medical History:  Past Medical History:   Diagnosis Date   ??? Hyperlipidemia 11/28/2012   ??? Insomnia      Patient Active Problem List   Diagnosis   ??? Hyperlipidemia   ??? Insomnia   ??? Routine general medical examination at a health care facility   ??? Preventative health care   ??? SK (seborrheic keratosis)         Past Surgical History:  Past Surgical History:   Procedure Laterality Date   ??? COLONOSCOPY  01/07/2012    Procedure: COLONOSCOPY;  Surgeon: Joslyn Devon, MD;  Location: [facility];  Service: Gastroenterology;  Laterality: N/A;  scope C#11   ??? UPPER GASTROINTESTINAL ENDOSCOPY  01/07/2012    Procedure: UPPER GI ENDOSCOPY;  Surgeon: Joslyn Devon, MD;  Location: [facility];  Service: Gastroenterology;  Laterality: N/A;  Scope E# 6       Social History:  Social History     Socioeconomic History   ??? Marital status: Married     Spouse name: Keith Rake   ??? Number of children: 1   ??? Years of education: masters   ??? Highest education level: Not on file Occupational History   ??? Occupation: Scientist, physiological   Social Needs   ??? Financial resource strain: Not on file   ??? Food insecurity:     Worry: Not on file     Inability: Not on file   ??? Transportation needs:     Medical: Not on file     Non-medical: Not on file   Tobacco Use   ??? Smoking status: Never Smoker   ??? Smokeless tobacco: Never Used   Substance and Sexual Activity   ??? Alcohol use: Yes     Alcohol/week: 4.8 - 6.0 oz     Types: 8 - 10 Glasses of Wine (5 oz) per week     Comment: mild   ??? Drug use: No   ??? Sexual activity: Yes     Partners: Male     Birth control/protection: None   Lifestyle   ??? Physical activity:     Days per week: 5 days     Minutes per session: 70 min   ???  Stress: To some extent   Relationships   ??? Social connections:     Talks on phone: Not on file     Gets together: Not on file     Attends religious service: Not on file     Active member of club or organization: Not on file     Attends meetings of clubs or organizations: Not on file     Relationship status: Not on file   Other Topics Concern   ??? Do you exercise at least a day, 3 or more days a week? Yes   ??? Types of Exercise? (List in Comments) Yes     Comment: gym   ??? Do you follow a special diet? No   ??? Vegan? Not Asked   ??? Vegetarian? Not Asked   ??? Pescatarian? Not Asked   ??? Lactose Free? Not Asked   ??? Gluten Free? Not Asked   ??? Omnivore? Not Asked   Social History Narrative    Diet; Balanced       Family History:   Family History   Problem Relation Age of Onset   ??? Skin cancer Mother    ??? Heart disease Mother         VALVE   ??? Arthritis Sister    ??? Diabetes Other         Family Hx   ??? Mental illness Other         Family Hx   ??? Asthma Other         Family Hx   ??? Allergies Other         Family Hx       Health Maintenance:   Vaccines:   Immunization History   Administered Date(s) Administered   ??? Influenza Whole 06/18/2018   ??? Td 10/18/2006   ??? Tdap 10/15/2013   ??? influenza vaccine IM quadrivalent (Fluzone Quad) (PF) SYR/SDV (71 months of age and older) 08/13/2014, 07/03/2015   ??? influenza vaccine IM quadrivalent (Fluzone Quad) MDV (80 months of age and older) 07/06/2016   ??? influenza, unspecified formulation 07/07/2011, 07/19/2013, 06/18/2017, 07/13/2017   ??? pneumococcal conjugate vaccine 13-valent (Prevnar) 10/31/2014, 12/15/2016     Tdap: +  Pneumovax: + pneumo23 #1  Zostervax: _zostrix #1   Hepatitis: +  Colon Cancer Screen: +  Prostate Cancer Screen: -  Abdominal US: -  PAP: +  Mammogram: +  Bone Density: +  Eye Exam: +  Dental Exam: +  Flu Shot:+      Physical Exam:     Objective:     Vitals:    Last Recorded Vital Signs:    01/01/19 0951   BP: 125/84   Pulse: 82   Temp: 36.9 ???C (98.4 ???F)   SpO2: 96%     Body mass index is 23.73 kg/m???.  Vitals:    01/01/19 0951   Weight: 147 lb (66.7 kg)   Height: 5' 6'' (1.676 m)      BP 125/84  ~ Pulse 82  ~ Temp 36.9 ???C (98.4 ???F) (Oral)  ~ Ht 5' 6'' (1.676 m)  ~ Wt 147 lb (66.7 kg)  ~ SpO2 96%  ~ BMI 23.73 kg/m???   General appearance: alert, appears stated age and cooperative  Head: Normocephalic, without obvious abnormality, atraumatic  Eyes: conjunctivae/corneas clear. PERRL, EOM's intact. Fundi benign.  Ears: normal TM's and external ear canals both ears  Nose: Nares normal. Septum midline. Mucosa normal. No drainage or sinus tenderness.  Neck: no  adenopathy, no carotid bruit, no JVD, supple, symmetrical, trachea midline and thyroid not enlarged, symmetric, no tenderness/mass/nodules  Back: symmetric, no curvature. ROM normal. No CVA tenderness.  Lungs: clear to auscultation bilaterally  Breasts: normal appearance, no masses or tenderness  Heart: regular rate and rhythm, S1, S2 normal, no murmur, click, rub or gallop  Abdomen: soft, non-tender; bowel sounds normal; no masses,  no organomegaly  Pelvic: deferred  Extremities: extremities normal, atraumatic, no cyanosis or edema  Pulses: 2+ and symmetric  Skin: Skin color, texture, turgor normal.  Multiple SEBORRHEIC KERATOSIS Lymph nodes: cervical, supraclavicular, and axillary nodes normal.  Neurologic: Grossly normal          Lab/Studies:  DRAWN   MA UNABLE TO DRAW BLOOD, NECESSITATING PHYSICIAN DRAW            Assessment & Plan:   Diagnoses and all orders for this visit:    Diagnoses and all orders for this visit:    Routine general medical examination at a health care facility  -     pneumococcal polysaccharide vaccine 23-valent (Pneumovax) subcutaneous/IM  -     Zoster Acc Recombinant Adjuvanted (Shingrix)  -     Lipid Panel; Future  -     TSH with reflex FT4, FT3; Future  -     Vitamin D,25-Hydroxy; Future  -     Comprehensive Metabolic Panel; Future  -     CBC & Auto Differential; Future  -     Hgb A1c; Future  -     Fecal Occult Blood Immunoassay; Future  -     POCT urinalysis dipstick  -     ECG 12 lead    SK (seborrheic keratosis)   Cryo >20     Mixed hyperlipidemia  -     Lipid Panel; Future  -     Comprehensive Metabolic Panel; Future    Primary insomnia    End of life care   ADVANCE CARE DIRECTIVE REVIEWED/ FORMS GIVEN WILL REVIEW AT NEXT VISIT                Laboratory will be reviewed and communicated to patient.      The below was during visit.     EKG     I have independently reviewed and interpreted the above.   The records are uploaded or scanned into care connect in the patients personal file.      30 min was spent with @NAMEBYAGE @ including over 50% FTF for non preventative related care, counseling, and problem management.       The above plan of care, diagnosis, orders, and follow-up were discussed with the patient.  Questions related to this recommended plan of care were answered.      Author:  Simon Rhein. Lajean Silvius, MD 01/01/2019 11:03 AM

## 2019-01-02 LAB — Differential Automated: MONOCYTE PERCENT, AUTO: 5.8 (ref 0.00–0.10)

## 2019-01-02 LAB — Comprehensive Metabolic Panel: TOTAL CO2: 24 mmol/L (ref 20–30)

## 2019-01-02 LAB — Vitamin D,25-Hydroxy: VITAMIN D,25-HYDROXY: 33 ng/mL (ref 20–50)

## 2019-01-02 LAB — TSH with reflex FT4, FT3: TSH: 1.7 u[IU]/mL (ref 0.3–4.7)

## 2019-01-02 LAB — Lipid Panel: CHOLESTEROL: 244 mg/dL (ref >50–<130)

## 2019-01-02 LAB — CBC: RED CELL DISTRIBUTION WIDTH-CV: 14.6 (ref 11.1–15.5)

## 2019-01-02 LAB — Hgb A1c: HGB A1C - HPLC: 5.8 — ABNORMAL HIGH (ref <5.7)

## 2019-01-03 ENCOUNTER — Telehealth (HOSPITAL_COMMUNITY): Payer: Self-pay | Admitting: Cardiology

## 2019-01-03 MED ORDER — SPIRONOLACTONE 25 MG PO TABS: 25.0000 mg | ORAL_TABLET | Freq: Every day | ORAL | 3 refills | Status: DC

## 2019-01-03 MED ORDER — DIGOXIN 125 MCG PO TABS: 0.1250 mg | ORAL_TABLET | Freq: Every day | ORAL | 3 refills | Status: DC

## 2019-01-03 NOTE — Telephone Encounter (Signed)
Triage Appointment Phone Calls  NOTE : At this time, do not cancel POST HOSP Appointments.   Introduction: We are calling today to discuss your upcoming appointment. As you know we are dealing with the Coronavirus COVID-19 and want to take all precautions necessary to protect our patients and staff.   Questions 1. Have you or any one in your house had a fever in the past 2 weeks?  NO a. If YES -> Have you had increased SOB, cough, or recent travel to a "high risk area" -> If NO -> "Self-quarantine at home" -> If YES -> Advise them to contact their PCP for further instructions, if they don't have PCP, then discuss with HF provider, Darnelle Bos, or Heather  -> NOTE: Patient can NOT go to a COVID-19 testing site without a physician's order 2. How are you feeling/doing?  Patient reports weight is stable with minimal SOB (nothing above baseline) denies cough, SOB, or swelling (pt reports she accumulates fluid in her torso and has severe SOB-denies this at this time)   a. Is your weight stable? BP stable if checking at home? Have they needed extra diuretics? Is their breathing at baseline?  -> If symptomatic, KEEP appt, and remind only ONE person may accompany them, and no children.   -> If asymptomatic, #3. 3. Do you have enough of your cardiac medications to last you for the next 2 months, or do you need refills?  NO Requested refills of spiro and dig # 90 [facility] [place]  4. Do you have enough food and supplies to last for at least 2 weeks? YES   -> If NO -> Refer to [place] 5. Reschedule their appointment with the following recommendations:   Patient already has follow up on 5 /13 with echo and DB Advised to keep this follow up at this time and aware of below instructions                -> We will remain open, so please call right away if they are developing any worsening symptoms   -> Their appointment may be out 2 months, but depending on the course of the virus, we may open up  appointments sooner   -> Encouraged them to sign up for my-chart and let them know we are looking at the possibility of virtual or telephone appointments

## 2019-01-08 ENCOUNTER — Encounter (HOSPITAL_COMMUNITY): Payer: Medicare PPO

## 2019-01-11 ENCOUNTER — Other Ambulatory Visit: Payer: Self-pay

## 2019-01-11 ENCOUNTER — Encounter (HOSPITAL_COMMUNITY): Payer: Self-pay

## 2019-01-11 ENCOUNTER — Ambulatory Visit (HOSPITAL_COMMUNITY)
Admission: RE | Admit: 2019-01-11 | Discharge: 2019-01-11 | Disposition: A | Discharge status: Home / Self Care | Payer: Medicare PPO | Source: Ambulatory Visit | Attending: Cardiology | Admitting: Cardiology

## 2019-01-11 VITALS — BP 131/93 | HR 85 | Wt 177.0 lb

## 2019-01-11 DIAGNOSIS — Z951 Presence of aortocoronary bypass graft: Secondary | ICD-10-CM

## 2019-01-11 DIAGNOSIS — Z7901 Long term (current) use of anticoagulants: Secondary | ICD-10-CM

## 2019-01-11 DIAGNOSIS — Z86711 Personal history of pulmonary embolism: Secondary | ICD-10-CM | POA: Diagnosis not present

## 2019-01-11 DIAGNOSIS — I2510 Atherosclerotic heart disease of native coronary artery without angina pectoris: Secondary | ICD-10-CM

## 2019-01-11 DIAGNOSIS — I5022 Chronic systolic (congestive) heart failure: Secondary | ICD-10-CM | POA: Diagnosis not present

## 2019-01-11 DIAGNOSIS — I255 Ischemic cardiomyopathy: Secondary | ICD-10-CM

## 2019-01-11 DIAGNOSIS — I25708 Atherosclerosis of coronary artery bypass graft(s), unspecified, with other forms of angina pectoris: Secondary | ICD-10-CM

## 2019-01-11 DIAGNOSIS — I2699 Other pulmonary embolism without acute cor pulmonale: Secondary | ICD-10-CM

## 2019-01-11 MED FILL — ENTRESTO 49 MG-51 MG TABLET: 49-51 | 30 days supply | Qty: 60 | Fill #0

## 2019-01-11 NOTE — Progress Notes (Signed)
Order for bmet mailed to patient with AVS.

## 2019-01-11 NOTE — Patient Instructions (Addendum)
Telehealth visit on 02/08/2019 at 9:30 am  Keep your scheduled appointment with Dr.Bensimhon on 02/28/2019.   Entresto increased to 49/51mg  twice daily.  BMET in 3 weeks at [facility].

## 2019-01-11 NOTE — Addendum Note (Signed)
Encounter addended by: Harvie Junior, CMA on: 01/11/2019 9:54 AM  Actions taken: Clinical Note Signed

## 2019-01-11 NOTE — Progress Notes (Signed)
Heart Failure TeleHealth Note  Due to national recommendations of social distancing due to [place] 19, Audio/video telehealth visit is felt to be most appropriate for this patient at this time.  See MyChart message from today for patient consent regarding telehealth for [facility].  Date:  01/11/2019   ID:  JOANELL CRESSLER, DOB 10/23/1955, MRN [id number]  Location: Home  Provider location: [address] [place] Type of Visit: Established patient  PCP:  Monico Blitz, MD  Cardiologist:  Kate Sable, MD Primary HF: Dr Haroldine Laws   Chief Complaint: Heart Failure   History of Present Illness: MALAYJA FREUND is a 63 y.o. female who presents via audio conferencing for a telehealth visit today.     Ms Arrazola is a 63 year old with a history of NICM, DVT, PE, and chronic systolic heart failure.    Catheterization done12/05/19revealed 90% proximal LAD and a 50% circumflex stenosis. She underwent bypass grafting x2 with an L IMA to the LAD and an SVG to the OM on 09/22/18 with Dr Roxan Hockey.  Echo  11/28/2018 EF 10-15% with moderate RV dysfunction.   cMRI 12/05/18 1. Moderately dilated LV with EF 21%, diffuse hypokinesis. 2. Normal RV size with moderately decreased systolic function, EF 63%. 3. Moderate TR, at least moderate central MR (likely functional). 4. No myocardial LGE, so no evidence for prior infarct, infiltrative disease, or myocarditis.  RHC 12/05/18 On milrinone 0.125 RA = 8 RV = 37/10 PA = 39/15 (25) PCW = 19 (v = 35) Fick cardiac output/index = 5.0/2.8 PVR = 1.1 WU Ao sat = 94% PA sat = 66%, 66% PaPi = 3.0 RA/PCW = 0.42  2 weeks ago entresto was increased to 49-51 mg twice a day however she has not picked up the higher dose. Says pharmacy did not have the higher dose.    Today, she denies symptoms of palpitations, chest pain, shortness of breath, orthopnea, PND, lower extremity edema, claudication, dizziness, presyncope, syncope, or  bleeding. Weight at home has been stable at 177-178 pounds. 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 The patient is tolerating medications without difficulties and is otherwise without complaint today.   She denies symptoms of cough, fevers, chills, or new SOB worrisome for COVID 19.    Past Medical History:  Diagnosis Date  . Arthritis    "probably; maybe in my hands" (09/21/2018)  . Congestive heart failure (CHF) ([place])   . Dyspnea   . Fibromyalgia   . GERD (gastroesophageal reflux disease)   . Hypercholesteremia   . Hypertension   . IBS (irritable bowel syndrome)   . Mild sleep apnea    no CPAP (09/21/2018)  . Myocardial infarction [facility])    "was told I have had one; never knew about it" (09/21/2018)  . Nonischemic cardiomyopathy (HCC)    ECHO EF 40-45%, Septal Thickness,and mild global left ventricular dysfunction; stress test 03/2004 +VE, refersible defect; ECHO 03/2005 @[facility]  EF 55%  . Pulmonary embolus [facility])    Past Surgical History:  Procedure Laterality Date  . ANKLE FRACTURE SURGERY Left 2000  . BUNIONECTOMY Right 1990s?   "& removed part of my great toe due to fungus under nail"  . CARDIAC CATHETERIZATION  03/2005  . CORONARY ARTERY BYPASS GRAFT N/A 09/22/2018   Procedure: CORONARY ARTERY BYPASS GRAFTING (CABG), ON PUMP, TIMES TWO, USING LEFT INTERNAL MAMMARY ARTERY AND ENDOSCOPICALLY HARVESTED RIGHT GREATER SAPHENOUS VEIN;  Surgeon: Melrose Nakayama, MD;  Location: [facility];  Service: Open Heart  Surgery;  Laterality: N/A;  . DILATION AND CURETTAGE OF UTERUS    . FRACTURE SURGERY    . RIGHT HEART CATH N/A 12/05/2018   Procedure: RIGHT HEART CATH;  Surgeon: Jolaine Artist, MD;  Location: [place] CV LAB;  Service: Cardiovascular;  Laterality: N/A;  . RIGHT/LEFT HEART CATH AND CORONARY ANGIOGRAPHY N/A 09/21/2018   Procedure: RIGHT/LEFT HEART CATH AND CORONARY ANGIOGRAPHY;  Surgeon: Troy Sine, MD;  Location: [place] CV LAB;  Service: Cardiovascular;  Laterality:  N/A;  . TEE WITHOUT CARDIOVERSION N/A 09/22/2018   Procedure: TRANSESOPHAGEAL ECHOCARDIOGRAM (TEE);  Surgeon: Melrose Nakayama, MD;  Location: [place];  Service: Open Heart Surgery;  Laterality: N/A;  . TUBAL LIGATION       Current Outpatient Medications  Medication Sig Dispense Refill  . aspirin EC 81 MG EC tablet Take 1 tablet (81 mg total) by mouth every evening. 30 tablet 0  . atorvastatin (LIPITOR) 80 MG tablet Take 1 tablet (80 mg total) by mouth daily at 6 PM. 30 tablet 1  . cholecalciferol (VITAMIN D3) 25 MCG (1000 UT) tablet Take 1,000 Units by mouth daily.    . digoxin (LANOXIN) 0.125 MG tablet Take 1 tablet (0.125 mg total) by mouth daily. 90 tablet 3  . DULoxetine (CYMBALTA) 60 MG capsule Take 60 mg by mouth 2 (two) times daily.    . furosemide (LASIX) 40 MG tablet Take 1 tablet (40 mg total) by mouth daily. May take 1 additional tab as needed for weight gain. 120 tablet 3  . gabapentin (NEURONTIN) 300 MG capsule Take 600 mg by mouth 3 (three) times daily.    . ivabradine (CORLANOR) 7.5 MG TABS tablet Take 1 tablet (7.5 mg total) by mouth 2 (two) times daily with a meal. 60 tablet 0  . pramipexole (MIRAPEX) 0.5 MG tablet Take 0.5 mg by mouth at bedtime.     . rivaroxaban (XARELTO) 20 MG TABS tablet Take 1 tablet (20 mg total) by mouth daily with breakfast. 30 tablet 0  . sacubitril-valsartan (ENTRESTO) 49-51 MG Take 1 tablet by mouth 2 (two) times daily. 60 tablet 11  . spironolactone (ALDACTONE) 25 MG tablet Take 1 tablet (25 mg total) by mouth daily. 90 tablet 3  . vitamin B-12 (CYANOCOBALAMIN) 500 MCG tablet Take 1 tablet (500 mcg total) by mouth daily. 30 tablet 0  . vitamin C (ASCORBIC ACID) 500 MG tablet Take 500 mg by mouth daily.     No current facility-administered medications for this encounter.     Allergies:   Poison oak extract   Social History:  The patient  reports that she quit smoking about 28 years ago. Her smoking use included cigarettes. She has a 30.00  pack-year smoking history. She has never used smokeless tobacco. She reports previous alcohol use. She reports that she does not use drugs.   Family History:  The patient's family history includes Alcohol abuse in her father; Colon cancer in her father; Congestive Heart Failure (age of onset: 60) in her brother; Depression in her father; Diabetes Mellitus II in her mother; Heart disease (age of onset: 59) in her mother.   ROS:  Please see the history of present illness.   All other systems are personally reviewed and negative.   Exam:  Tele Health Call; Exam is subjective General:  . No resp difficulty. HEENT: n/a Neck: Denies tenderness Cor: reported regular pulse.  Lungs: Normal respiratory effort with conversation.  Abdomen: Non-distended. Pt denies tenderness with self palpation.  Extremities: Pt denies edema. Neuro: Alert & oriented x 3.   Recent Labs: 11/27/2018: ALT 71 12/07/2018: Magnesium 2.6; TSH 2.343 12/15/2018: Hemoglobin 13.9; Platelets 264 12/29/2018: B Natriuretic Peptide 1,446.4; BUN 18; Creatinine, Ser 0.99; Potassium 4.4; Sodium 140  Personally reviewed   Wt Readings from Last 3 Encounters:  12/29/18 83.3 kg (183 lb 9.6 oz)  12/15/18 81.6 kg (179 lb 12.8 oz)  12/07/18 78.6 kg (173 lb 4.5 oz)      Other studies personally reviewed: Additional studies/ records that were reviewed today include: n/a   ASSESSMENT AND PLAN:  1. Chronic Systolic Heart Failure  -Echo 11/28/18: EF 10-15%, RV moderately reduced, severe functional MR.  - cMRI 12/05/18 with LVEF 21%, RV moderately down, Moderate TR and at least moderate, functional MR. No LGE.  NYHA II. Functional improvement. Weight stable at home. Continue lasix 40 mg daily.  Not on bb yet with recent low output.  -Continue current dose of digoxin and spironolactone dose.  - She will increase entresto to 49-51 mg twice a day when she receives it in the mail. Plan to check BMET in 3 weeks at [facility]  2. H/O Bilateral PE  Continue xarelto daily. She will contact the pharmacy for xarelto refill.   3. CAD s/p CABG 09/2018 -LIMA to the LAD and an SVG to the OM on 12/6/19with Dr Roxan Hockey -No s/s ischemia. Continue statin and xarelto.    COVID screen The patient does not have any symptoms that suggest any further testing/ screening at this time.  Social distancing reinforced today.  Recommended follow-up:  4 weeks for televisit. 8 weeks for office visit with Dr Baldwin [place].   Relevant cardiac medications were reviewed at length with the patient today.   The patient does not have concerns regarding their medications at this time.   The following changes were made today:  Planning to start entresto at higher dose when she receives in the mail. This was ordered 2 weeks ago.   Labs/ tests ordered today include: BMET in 3 weeks at [facility].   Patient Risk: After full review of this patients clinical status, I feel that they are at moderate risk for cardiac decompensation at this time.  Today, I have spent 24 minutes with the patient with telehealth technology discussing heart failure regimen.   Next visit anticipate starting bb.   Jeanmarie Hubert, NP  01/11/2019 8:49 AM  [facility] [address] Heart and [place] [address] [phone] (office) [phone] (fax)

## 2019-01-12 ENCOUNTER — Encounter (HOSPITAL_COMMUNITY): Payer: Medicare PPO

## 2019-01-22 ENCOUNTER — Other Ambulatory Visit (HOSPITAL_COMMUNITY): Payer: Self-pay | Admitting: Adult Health

## 2019-01-22 DIAGNOSIS — I5022 Chronic systolic (congestive) heart failure: Secondary | ICD-10-CM | POA: Diagnosis not present

## 2019-01-23 LAB — BASIC METABOLIC PANEL
BUN/Creatinine Ratio: 21 (ref 12–28)
BUN: 16 mg/dL (ref 8–27)
CO2: 22 mmol/L (ref 20–29)
Calcium: 9.2 mg/dL (ref 8.7–10.3)
Chloride: 102 mmol/L (ref 96–106)
Creatinine, Ser: 0.75 mg/dL (ref 0.57–1.00)
GFR calc Af Amer: 98 mL/min/{1.73_m2} (ref >59)
GFR calc non Af Amer: 85 mL/min/{1.73_m2} (ref >59)
Glucose: 87 mg/dL (ref 65–99)
Potassium: 4.7 mmol/L (ref 3.5–5.2)
Sodium: 139 mmol/L (ref 134–144)

## 2019-01-29 ENCOUNTER — Encounter (HOSPITAL_COMMUNITY): Payer: Medicare PPO

## 2019-01-31 ENCOUNTER — Other Ambulatory Visit: Payer: Self-pay | Admitting: Cardiology

## 2019-01-31 MED FILL — CORLANOR 7.5 MG TABLET: 7.5 | 30 days supply | Qty: 60 | Fill #0

## 2019-02-01 DIAGNOSIS — Z87891 Personal history of nicotine dependence: Secondary | ICD-10-CM | POA: Diagnosis not present

## 2019-02-01 DIAGNOSIS — G2581 Restless legs syndrome: Secondary | ICD-10-CM | POA: Diagnosis not present

## 2019-02-01 DIAGNOSIS — I2510 Atherosclerotic heart disease of native coronary artery without angina pectoris: Secondary | ICD-10-CM | POA: Diagnosis not present

## 2019-02-01 DIAGNOSIS — Z973 Presence of spectacles and contact lenses: Secondary | ICD-10-CM | POA: Diagnosis not present

## 2019-02-01 DIAGNOSIS — Z9181 History of falling: Secondary | ICD-10-CM | POA: Diagnosis not present

## 2019-02-01 DIAGNOSIS — M25511 Pain in right shoulder: Secondary | ICD-10-CM | POA: Diagnosis not present

## 2019-02-01 DIAGNOSIS — H547 Unspecified visual loss: Secondary | ICD-10-CM | POA: Diagnosis not present

## 2019-02-01 DIAGNOSIS — I509 Heart failure, unspecified: Secondary | ICD-10-CM | POA: Diagnosis not present

## 2019-02-01 DIAGNOSIS — E785 Hyperlipidemia, unspecified: Secondary | ICD-10-CM | POA: Diagnosis not present

## 2019-02-01 DIAGNOSIS — Z951 Presence of aortocoronary bypass graft: Secondary | ICD-10-CM | POA: Diagnosis not present

## 2019-02-01 DIAGNOSIS — I252 Old myocardial infarction: Secondary | ICD-10-CM | POA: Diagnosis not present

## 2019-02-01 DIAGNOSIS — I110 Hypertensive heart disease with heart failure: Secondary | ICD-10-CM | POA: Diagnosis not present

## 2019-02-01 DIAGNOSIS — Z86718 Personal history of other venous thrombosis and embolism: Secondary | ICD-10-CM | POA: Diagnosis not present

## 2019-02-01 DIAGNOSIS — I255 Ischemic cardiomyopathy: Secondary | ICD-10-CM | POA: Diagnosis not present

## 2019-02-06 ENCOUNTER — Other Ambulatory Visit (HOSPITAL_COMMUNITY): Payer: Self-pay | Admitting: Internal Medicine

## 2019-02-08 ENCOUNTER — Other Ambulatory Visit: Payer: Self-pay

## 2019-02-08 ENCOUNTER — Encounter (HOSPITAL_COMMUNITY): Payer: Self-pay

## 2019-02-08 ENCOUNTER — Ambulatory Visit (HOSPITAL_COMMUNITY)
Admission: RE | Admit: 2019-02-08 | Discharge: 2019-02-08 | Disposition: A | Discharge status: Home / Self Care | Payer: Medicare PPO | Source: Ambulatory Visit | Attending: Adult Health | Admitting: Adult Health

## 2019-02-08 VITALS — BP 123/84 | HR 70 | Wt 179.0 lb

## 2019-02-08 DIAGNOSIS — I25708 Atherosclerosis of coronary artery bypass graft(s), unspecified, with other forms of angina pectoris: Secondary | ICD-10-CM

## 2019-02-08 DIAGNOSIS — I2699 Other pulmonary embolism without acute cor pulmonale: Secondary | ICD-10-CM

## 2019-02-08 DIAGNOSIS — I5022 Chronic systolic (congestive) heart failure: Secondary | ICD-10-CM | POA: Diagnosis not present

## 2019-02-08 DIAGNOSIS — I255 Ischemic cardiomyopathy: Secondary | ICD-10-CM

## 2019-02-08 MED ORDER — SACUBITRIL-VALSARTAN 97-103 MG PO TABS: 1.0000 | ORAL_TABLET | Freq: Two times a day (BID) | ORAL | 3 refills | Status: DC

## 2019-02-08 MED FILL — ENTRESTO 97 MG-103 MG TAB: 97-103 | 30 days supply | Qty: 60 | Fill #0

## 2019-02-08 NOTE — Progress Notes (Signed)
Spoke with patient, given instructions from AVS.  Rx for labs mailed.

## 2019-02-08 NOTE — Addendum Note (Signed)
Encounter addended by: Valeda Malm, RN on: 02/08/2019 4:28 PM  Actions taken: Order list changed, Clinical Note Signed

## 2019-02-08 NOTE — Addendum Note (Signed)
Encounter addended by: Valeda Malm, RN on: 02/08/2019 4:38 PM  Actions taken: Order list changed

## 2019-02-08 NOTE — Progress Notes (Signed)
Heart Failure TeleHealth Note  Due to national recommendations of social distancing due to [place] 19, Audio/video telehealth visit is felt to be most appropriate for this patient at this time.  See MyChart message from today for patient consent regarding telehealth for [facility].  Date:  02/08/2019   ID:  Jennifer [name], DOB [birth year], MRN [id number]  Location: Home  Provider location: [place] [facility] Type of Visit: Established patient   PCP:  Monico Blitz, MD  Cardiologist:  Kate Sable, MD Primary HF: Dr Haroldine Laws   Chief Complaint: Heart Failure   History of Present Illness: Jennifer [name] is a 63 y.o. female with a history of NICM, DVT, PE, and chronic systolic heart failure.    Catheterization done12/05/19revealed 90% proximal LAD and a 50% circumflex stenosis. She underwent bypass grafting x2 with an L IMA to the LAD and an SVG to the OM on 09/22/18 with Dr Roxan Hockey.  Echo  11/28/2018 EF 10-15% with moderate RV dysfunction.   cMRI 12/05/18 1. Moderately dilated LV with EF 21%, diffuse hypokinesis. 2. Normal RV size with moderately decreased systolic function, EF 70%. 3. Moderate TR, at least moderate central MR (likely functional). 4. No myocardial LGE, so no evidence for prior infarct, infiltrative disease, or myocarditis.  RHC 12/05/18 On milrinone 0.125 RA = 8 RV = 37/10 PA = 39/15 (25) PCW = 19 (v = 35) Fick cardiac output/index = 5.0/2.8 PVR = 1.1 WU Ao sat = 94% PA sat = 66%, 66% PaPi = 3.0 RA/PCW = 0.42  She  presents via audio conferencing for a telehealth visit today.  Overall feeling fine. 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  Denies SOB/PND/Orthopnea. Appetite has been excellent. No fever or chills. Weight at home  179 pounds. She has taken extra lasix for abdominal bloating.  She has only had one extra dose. aking all medications. No bleeding problems.   she denies symptoms  worrisome for COVID 19.   Past Medical History:  Diagnosis Date  . Arthritis    "probably; maybe in my hands" (09/21/2018)  . Congestive heart failure (CHF) ([place])   . Dyspnea   . Fibromyalgia   . GERD (gastroesophageal reflux disease)   . Hypercholesteremia   . Hypertension   . IBS (irritable bowel syndrome)   . Mild sleep apnea    no CPAP (09/21/2018)  . Myocardial infarction [facility])    "was told I have had one; never knew about it" (09/21/2018)  . Nonischemic cardiomyopathy (HCC)    ECHO EF 40-45%, Septal Thickness,and mild global left ventricular dysfunction; stress test 03/2004 +VE, refersible defect; ECHO 03/2005 @[facility]  EF 55%  . Pulmonary embolus [facility])    Past Surgical History:  Procedure Laterality Date  . ANKLE FRACTURE SURGERY Left 2000  . BUNIONECTOMY Right 1990s?   "& removed part of my great toe due to fungus under nail"  . CARDIAC CATHETERIZATION  03/2005  . CORONARY ARTERY BYPASS GRAFT N/A 09/22/2018   Procedure: CORONARY ARTERY BYPASS GRAFTING (CABG), ON PUMP, TIMES TWO, USING LEFT INTERNAL MAMMARY ARTERY AND ENDOSCOPICALLY HARVESTED RIGHT GREATER SAPHENOUS VEIN;  Surgeon: Melrose Nakayama, MD;  Location: [place];  Service: Open Heart Surgery;  Laterality: N/A;  . DILATION AND CURETTAGE OF UTERUS    . FRACTURE SURGERY    . RIGHT HEART CATH N/A 12/05/2018   Procedure: RIGHT HEART CATH;  Surgeon: Jolaine Artist, MD;  Location: [place] CV  LAB;  Service: Cardiovascular;  Laterality: N/A;  . RIGHT/LEFT HEART CATH AND CORONARY ANGIOGRAPHY N/A 09/21/2018   Procedure: RIGHT/LEFT HEART CATH AND CORONARY ANGIOGRAPHY;  Surgeon: Troy Sine, MD;  Location: [place] CV LAB;  Service: Cardiovascular;  Laterality: N/A;  . TEE WITHOUT CARDIOVERSION N/A 09/22/2018   Procedure: TRANSESOPHAGEAL ECHOCARDIOGRAM (TEE);  Surgeon: Melrose Nakayama, MD;  Location: [place];  Service: Open Heart Surgery;  Laterality: N/A;  . TUBAL LIGATION       Current Outpatient Medications   Medication Sig Dispense Refill  . aspirin EC 81 MG EC tablet Take 1 tablet (81 mg total) by mouth every evening. 30 tablet 0  . atorvastatin (LIPITOR) 80 MG tablet Take 1 tablet (80 mg total) by mouth daily at 6 PM. 30 tablet 1  . cholecalciferol (VITAMIN D3) 25 MCG (1000 UT) tablet Take 1,000 Units by mouth daily.    . CORLANOR 7.5 MG TABS tablet TAKE 1 TABLET (7.5 MG TOTAL) BY MOUTH 2 (TWO) TIMES DAILY WITH A MEAL. 60 tablet 6  . digoxin (LANOXIN) 0.125 MG tablet Take 1 tablet (0.125 mg total) by mouth daily. 90 tablet 3  . DULoxetine (CYMBALTA) 60 MG capsule Take 60 mg by mouth 2 (two) times daily.    . furosemide (LASIX) 40 MG tablet Take 1 tablet (40 mg total) by mouth daily. May take 1 additional tab as needed for weight gain. 120 tablet 3  . gabapentin (NEURONTIN) 300 MG capsule Take 600 mg by mouth 3 (three) times daily.    . pramipexole (MIRAPEX) 0.5 MG tablet Take 0.5 mg by mouth at bedtime.     . rivaroxaban (XARELTO) 20 MG TABS tablet Take 1 tablet (20 mg total) by mouth daily with breakfast. 30 tablet 0  . sacubitril-valsartan (ENTRESTO) 49-51 MG Take 1 tablet by mouth 2 (two) times daily. 60 tablet 11  . spironolactone (ALDACTONE) 25 MG tablet Take 1 tablet (25 mg total) by mouth daily. 90 tablet 3  . vitamin B-12 (CYANOCOBALAMIN) 500 MCG tablet Take 1 tablet (500 mcg total) by mouth daily. 30 tablet 0  . vitamin C (ASCORBIC ACID) 500 MG tablet Take 500 mg by mouth daily.     No current facility-administered medications for this encounter.     Allergies:   Poison oak extract   Social History:  The patient  reports that she quit smoking about 28 years ago. Her smoking use included cigarettes. She has a 30.00 pack-year smoking history. She has never used smokeless tobacco. She reports previous alcohol use. She reports that she does not use drugs.   Family History:  The patient's family history includes Alcohol abuse in her father; Colon cancer in her father; Congestive Heart  Failure (age of onset: 67) in her brother; Depression in her father; Diabetes Mellitus II in her mother; Heart disease (age of onset: 60) in her mother.   ROS:  Please see the history of present illness.   All other systems are personally reviewed and negative.   Exam:  Tele Health Call; Exam is subjective  General:  Speaks in full sentences. No resp difficulty. Lungs: Normal respiratory effort with conversation.  Abdomen: Non-distended per patient report Extremities: Pt denies edema. Neuro: Alert & oriented x 3.   Recent Labs: 11/27/2018: ALT 71 12/07/2018: Magnesium 2.6; TSH 2.343 12/15/2018: Hemoglobin 13.9; Platelets 264 12/29/2018: B Natriuretic Peptide 1,446.4 01/22/2019: BUN 16; Creatinine, Ser 0.75; Potassium 4.7; Sodium 139  Personally reviewed   Wt Readings from Last 3 Encounters:  02/08/19 81.2 kg (179 lb)  01/11/19 80.3 kg (177 lb)  12/29/18 83.3 kg (183 lb 9.6 oz)     Vitals:   02/08/19 1330  BP: 123/84  Pulse: 70    ASSESSMENT AND PLAN:   1. Chronic Systolic Heart Failure  -Echo 11/28/18: EF 10-15%, RV moderately reduced, severe functional MR.  - cMRI 12/05/18 with LVEF 21%, RV moderately down, Moderate TR and at least moderate, functional MR. No LGE. ECHO next month on 02/28/19.  NYHA II.  Volume status stable. Continue lasix 40 mg daily.  Not on bb yet with recent low output.  -Continue current dose of digoxin and spironolactone dose.  -Inrease entresto 97-103 twice a day. Check BMET next week.  - Continue corlanor 7.5 mg twice a day.   2. H/O Bilateral PE Continue xarelto daily. She will contact the pharmacy for xarelto refill.   3. CAD s/p CABG 09/2018 -LIMA to the LAD and an SVG to the OM on 12/6/19with Dr Roxan Hockey -No s/s ischemia.  -Continue statin and xarelto.   COVID screen The patient does not have any symptoms that suggest any further testing/ screening at this time.  Social distancing reinforced today.  Patient Risk: After full review of  this patients clinical status, I feel that they are at moderate risk for cardiac decompensation at this time.  Relevant cardiac medications were reviewed at length with the patient today. The patient does not have concerns regarding their medications at this time.   The following changes were made today:  Increase entresto 97-103 twice a day. [place] will mail to her house. Check BMET next week at [facility].   Recommended follow-up:  Follow up--> 02/28/19 for an ECHO. This has already been set up.   Today, I have spent 20 minutes with the patient with telehealth technology discussing the above issues .    Jeanmarie Hubert, NP  02/08/2019 1:46 PM  [place] [address] Heart and [place] [address] [phone] (office) [phone] (fax)

## 2019-02-08 NOTE — Progress Notes (Addendum)
Called patient and LM for patient to return call to office to discuss AVS.  Copy of AVS sent to patient via mychart

## 2019-02-08 NOTE — Patient Instructions (Addendum)
Increase entresto 97-103 twice a day. This medication will be mailed to you.    Check BMET 7 days after starting the higher dose. A prescription will be mailed to you.

## 2019-02-09 NOTE — Addendum Note (Signed)
Encounter addended by: Harvie Junior, CMA on: 02/09/2019 10:33 AM  Actions taken: Clinical Note Signed

## 2019-02-09 NOTE — Progress Notes (Signed)
Script mailed for bmet

## 2019-02-12 ENCOUNTER — Ambulatory Visit: Payer: Medicare PPO | Admitting: Cardiovascular Disease

## 2019-02-12 DIAGNOSIS — I2699 Other pulmonary embolism without acute cor pulmonale: Secondary | ICD-10-CM | POA: Diagnosis not present

## 2019-02-12 DIAGNOSIS — I82409 Acute embolism and thrombosis of unspecified deep veins of unspecified lower extremity: Secondary | ICD-10-CM | POA: Diagnosis not present

## 2019-02-12 DIAGNOSIS — I10 Essential (primary) hypertension: Secondary | ICD-10-CM | POA: Diagnosis not present

## 2019-02-12 DIAGNOSIS — Z299 Encounter for prophylactic measures, unspecified: Secondary | ICD-10-CM | POA: Diagnosis not present

## 2019-02-12 DIAGNOSIS — I509 Heart failure, unspecified: Secondary | ICD-10-CM | POA: Diagnosis not present

## 2019-02-12 DIAGNOSIS — Z6833 Body mass index (BMI) 33.0-33.9, adult: Secondary | ICD-10-CM | POA: Diagnosis not present

## 2019-02-15 NOTE — Addendum Note (Signed)
Encounter addended by: Scarlette Calico, RN on: 02/15/2019 2:53 PM  Actions taken: Order list changed, Diagnosis association updated

## 2019-02-19 ENCOUNTER — Ambulatory Visit (HOSPITAL_COMMUNITY)
Admission: RE | Admit: 2019-02-19 | Discharge: 2019-02-19 | Disposition: A | Discharge status: Home / Self Care | Payer: Medicare PPO | Source: Ambulatory Visit | Attending: Cardiology | Admitting: Cardiology

## 2019-02-19 ENCOUNTER — Other Ambulatory Visit: Payer: Self-pay

## 2019-02-19 DIAGNOSIS — I5022 Chronic systolic (congestive) heart failure: Secondary | ICD-10-CM | POA: Diagnosis not present

## 2019-02-19 NOTE — Progress Notes (Signed)
*  PRELIMINARY RESULTS* Echocardiogram 2D Echocardiogram has been performed.  Jennifer [name] 02/19/2019, 9:48 AM

## 2019-02-23 DIAGNOSIS — I5022 Chronic systolic (congestive) heart failure: Secondary | ICD-10-CM | POA: Diagnosis not present

## 2019-02-26 ENCOUNTER — Other Ambulatory Visit: Payer: Self-pay

## 2019-02-26 ENCOUNTER — Telehealth (HOSPITAL_COMMUNITY): Payer: Self-pay

## 2019-02-26 ENCOUNTER — Ambulatory Visit (HOSPITAL_COMMUNITY)
Admission: RE | Admit: 2019-02-26 | Discharge: 2019-02-26 | Disposition: A | Discharge status: Home / Self Care | Payer: Medicare PPO | Source: Ambulatory Visit | Attending: Cardiology | Admitting: Cardiology

## 2019-02-26 VITALS — BP 118/82 | HR 68 | Wt 188.4 lb

## 2019-02-26 DIAGNOSIS — I5022 Chronic systolic (congestive) heart failure: Secondary | ICD-10-CM | POA: Diagnosis not present

## 2019-02-26 DIAGNOSIS — M797 Fibromyalgia: Secondary | ICD-10-CM | POA: Insufficient documentation

## 2019-02-26 DIAGNOSIS — I5043 Acute on chronic combined systolic (congestive) and diastolic (congestive) heart failure: Secondary | ICD-10-CM | POA: Diagnosis not present

## 2019-02-26 DIAGNOSIS — I493 Ventricular premature depolarization: Secondary | ICD-10-CM | POA: Diagnosis not present

## 2019-02-26 DIAGNOSIS — I2510 Atherosclerotic heart disease of native coronary artery without angina pectoris: Secondary | ICD-10-CM | POA: Insufficient documentation

## 2019-02-26 DIAGNOSIS — I2699 Other pulmonary embolism without acute cor pulmonale: Secondary | ICD-10-CM

## 2019-02-26 DIAGNOSIS — Z7982 Long term (current) use of aspirin: Secondary | ICD-10-CM | POA: Insufficient documentation

## 2019-02-26 DIAGNOSIS — Z86718 Personal history of other venous thrombosis and embolism: Secondary | ICD-10-CM | POA: Diagnosis not present

## 2019-02-26 DIAGNOSIS — Z87891 Personal history of nicotine dependence: Secondary | ICD-10-CM | POA: Diagnosis not present

## 2019-02-26 DIAGNOSIS — Z7901 Long term (current) use of anticoagulants: Secondary | ICD-10-CM | POA: Insufficient documentation

## 2019-02-26 DIAGNOSIS — Z8249 Family history of ischemic heart disease and other diseases of the circulatory system: Secondary | ICD-10-CM | POA: Insufficient documentation

## 2019-02-26 DIAGNOSIS — E7800 Pure hypercholesterolemia, unspecified: Secondary | ICD-10-CM | POA: Diagnosis not present

## 2019-02-26 DIAGNOSIS — I25708 Atherosclerosis of coronary artery bypass graft(s), unspecified, with other forms of angina pectoris: Secondary | ICD-10-CM

## 2019-02-26 DIAGNOSIS — Z79899 Other long term (current) drug therapy: Secondary | ICD-10-CM | POA: Diagnosis not present

## 2019-02-26 DIAGNOSIS — I110 Hypertensive heart disease with heart failure: Secondary | ICD-10-CM | POA: Insufficient documentation

## 2019-02-26 DIAGNOSIS — I255 Ischemic cardiomyopathy: Secondary | ICD-10-CM

## 2019-02-26 DIAGNOSIS — Z951 Presence of aortocoronary bypass graft: Secondary | ICD-10-CM | POA: Insufficient documentation

## 2019-02-26 DIAGNOSIS — I252 Old myocardial infarction: Secondary | ICD-10-CM | POA: Diagnosis not present

## 2019-02-26 LAB — BASIC METABOLIC PANEL
Anion gap: 13 (ref 5–15)
BUN: 21 mg/dL (ref 8–23)
CO2: 20 mmol/L — ABNORMAL LOW (ref 22–32)
Calcium: 9.1 mg/dL (ref 8.9–10.3)
Chloride: 105 mmol/L (ref 98–111)
Creatinine, Ser: 1 mg/dL (ref 0.44–1.00)
GFR calc Af Amer: >60 mL/min (ref >60)
GFR calc non Af Amer: 60 mL/min — ABNORMAL LOW (ref >60)
Glucose, Bld: 113 mg/dL — ABNORMAL HIGH (ref 70–99)
Potassium: 4.6 mmol/L (ref 3.5–5.1)
Sodium: 138 mmol/L (ref 135–145)

## 2019-02-26 LAB — CBC
HCT: 44.6 % (ref 36.0–46.0)
Hemoglobin: 14.5 g/dL (ref 12.0–15.0)
MCH: 31.9 pg (ref 26.0–34.0)
MCHC: 32.5 g/dL (ref 30.0–36.0)
MCV: 98.2 fL (ref 80.0–100.0)
Platelets: 242 10*3/uL (ref 150–400)
RBC: 4.54 MIL/uL (ref 3.87–5.11)
RDW: 12.6 % (ref 11.5–15.5)
WBC: 8.2 10*3/uL (ref 4.0–10.5)
nRBC: 0 % (ref 0.0–0.2)

## 2019-02-26 LAB — BRAIN NATRIURETIC PEPTIDE: B Natriuretic Peptide: 351.3 pg/mL — ABNORMAL HIGH (ref 0.0–100.0)

## 2019-02-26 MED ORDER — FUROSEMIDE 40 MG PO TABS: 40.0000 mg | ORAL_TABLET | ORAL | 3 refills | Status: DC

## 2019-02-26 NOTE — Progress Notes (Signed)
ReDS Vest - 02/26/19 0900      ReDS Vest   MR   No  (Pended)     Estimated volume prior to reading  Med  (Pended)     Fitting Posture  Sitting  (Pended)     Height Marker  Short  (Pended)     Ruler Value  38  (Pended)     Center Strip  Aligned  (Pended)     ReDS Value  31  (Pended)

## 2019-02-26 NOTE — Telephone Encounter (Signed)
Pt aware of lab results. Verbalized understanding. No further questions offered.

## 2019-02-26 NOTE — Patient Instructions (Signed)
Take Lasix every other day.  Labs were done today. We will call you with any ABNORMAL results. No news is good news!  You have been referred to Electrophysiology for an implanted device, this office will call to schedule an appointment with you.  You have been referred to complete a Cardiopulmonary Exercise test, we will call you to schedule an appointment with you.  Your provider has recommended that  you wear a Zio Patch for 3 days.  This monitor will record your heart rhythm for our review.  IF you have any symptoms while wearing the monitor please press the button.  If you have any issues with the patch or you notice a red or orange light on it please call the company at [phone].  Once you remove the patch please mail it back to the company as soon as possible so we can get the results.  Please follow up with Jennifer [name] in 6 weeks.

## 2019-02-26 NOTE — Progress Notes (Signed)
PCP: Dr Weldon Picking Primary Cardiologist: Dr Haroldine Laws   HPI: Jennifer [name] is a 63 year old with a history of NICM, DVT, PE, fibromyalgia,and chronic systolic heart failure.    Catheterization done12/05/19revealed 90% proximal LAD and a 50% circumflex stenosis. She underwent bypass grafting x2 with an L IMA to the LAD and an SVG to the OM on 09/22/18 with Dr Roxan Hockey.  Today she returns for HF follow up. On 02/08/19 entresto was increased to 97-103 twice a day. Overall feeling fair.  She has noticed walking back from her barn fatigue and some dyspnea which is a change for her. Mild dyspnea with steps and walking up hills. 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  Denies PND/Orthopnea. Appetite ok. No fever or chills. Weight at home 181-186 pounds. No BRBPR. Taking all medications.  Echo  02/19/2019 EF 20-25% RV severely reduced  11/28/2018 EF 10-15% with moderate RV dysfunction.   cMRI 12/05/18 1. Moderately dilated LV with EF 21%, diffuse hypokinesis. 2. Normal RV size with moderately decreased systolic function, EF 23%. 3. Moderate TR, at least moderate central MR (likely functional). 4. No myocardial LGE, so no evidence for prior infarct, infiltrative disease, or myocarditis.  RHC 12/05/18 On milrinone 0.125 RA = 8 RV = 37/10 PA = 39/15 (25) PCW = 19 (v = 35) Fick cardiac output/index = 5.0/2.8 PVR = 1.1 WU Ao sat = 94% PA sat = 66%, 66% PaPi = 3.0 RA/PCW = 0.42  ROS: All systems negative except as listed in HPI, PMH and Problem List.  SH:  Social History   Socioeconomic History  . Marital status: Widowed    Spouse name: Not on file  . Number of children: Not on file  . Years of education: Not on file  . Highest education level: Not on file  Occupational History  . Not on file  Social Needs  . Financial resource strain: Not on file  . Food insecurity:    Worry: Not on file    Inability: Not on file  . Transportation needs:     Medical: Not on file    Non-medical: Not on file  Tobacco Use  . Smoking status: Former Smoker    Packs/day: 1.00    Years: 30.00    Pack years: 30.00    Types: Cigarettes    Last attempt to quit: 1992    Years since quitting: 28.3  . Smokeless tobacco: Never Used  Substance and Sexual Activity  . Alcohol use: Not Currently    Frequency: Never  . Drug use: Never  . Sexual activity: Not Currently  Lifestyle  . Physical activity:    Days per week: Not on file    Minutes per session: Not on file  . Stress: Not on file  Relationships  . Social connections:    Talks on phone: Not on file    Gets together: Not on file    Attends religious service: Not on file    Active member of club or organization: Not on file    Attends meetings of clubs or organizations: Not on file    Relationship status: Not on file  . Intimate partner violence:    Fear of current or ex partner: Not on file    Emotionally abused: Not on file    Physically abused: Not on file    Forced sexual activity: Not on file  Other Topics Concern  . Not on file  Social History Narrative  . Not on file    FH:  Family History  Problem Relation Age of Onset  . Diabetes Mellitus II Mother   . Heart disease Mother 60  . Colon cancer Father   . Alcohol abuse Father   . Depression Father   . Congestive Heart Failure Brother 39       probably dilated cardiomyopathy after PNA    Past Medical History:  Diagnosis Date  . Arthritis    "probably; maybe in my hands" (09/21/2018)  . Congestive heart failure (CHF) ([place])   . Dyspnea   . Fibromyalgia   . GERD (gastroesophageal reflux disease)   . Hypercholesteremia   . Hypertension   . IBS (irritable bowel syndrome)   . Mild sleep apnea    no CPAP (09/21/2018)  . Myocardial infarction [facility])    "was told I have had one; never knew about it" (09/21/2018)  . Nonischemic cardiomyopathy (HCC)    ECHO EF 40-45%, Septal Thickness,and mild global left ventricular  dysfunction; stress test 03/2004 +VE, refersible defect; ECHO 03/2005 @[facility]  EF 55%  . Pulmonary embolus [facility])     Current Outpatient Medications  Medication Sig Dispense Refill  . aspirin EC 81 MG EC tablet Take 1 tablet (81 mg total) by mouth every evening. 30 tablet 0  . atorvastatin (LIPITOR) 80 MG tablet Take 1 tablet (80 mg total) by mouth daily at 6 PM. 30 tablet 1  . cholecalciferol (VITAMIN D3) 25 MCG (1000 UT) tablet Take 1,000 Units by mouth daily.    . CORLANOR 7.5 MG TABS tablet TAKE 1 TABLET (7.5 MG TOTAL) BY MOUTH 2 (TWO) TIMES DAILY WITH A MEAL. 60 tablet 6  . digoxin (LANOXIN) 0.125 MG tablet Take 1 tablet (0.125 mg total) by mouth daily. 90 tablet 3  . DULoxetine (CYMBALTA) 60 MG capsule Take 60 mg by mouth 2 (two) times daily.    . furosemide (LASIX) 40 MG tablet Take 1 tablet (40 mg total) by mouth daily. May take 1 additional tab as needed for weight gain. 120 tablet 3  . gabapentin (NEURONTIN) 300 MG capsule Take 600 mg by mouth 3 (three) times daily.    . pramipexole (MIRAPEX) 0.5 MG tablet Take 0.5 mg by mouth at bedtime.     . rivaroxaban (XARELTO) 20 MG TABS tablet Take 1 tablet (20 mg total) by mouth daily with breakfast. 30 tablet 0  . sacubitril-valsartan (ENTRESTO) 97-103 MG Take 1 tablet by mouth 2 (two) times daily. 180 tablet 3  . spironolactone (ALDACTONE) 25 MG tablet Take 1 tablet (25 mg total) by mouth daily. 90 tablet 3  . vitamin C (ASCORBIC ACID) 500 MG tablet Take 500 mg by mouth daily.    . vitamin B-12 (CYANOCOBALAMIN) 500 MCG tablet Take 1 tablet (500 mcg total) by mouth daily. (Patient not taking: Reported on 02/26/2019) 30 tablet 0   No current facility-administered medications for this encounter.     Vitals:   02/26/19 0904 02/26/19 0905  BP:  118/82  Pulse:  68  SpO2:  94%  Weight: 85.5 kg (188 lb 6.4 oz) 85.5 kg (188 lb 6.4 oz)   ReDS Vest - 02/26/19 0900      ReDS Vest   MR   No    Estimated volume prior to reading  Med    Fitting  Posture  Sitting    Height Marker  Short    Ruler Value  [place]  ReDS Value  31      Wt Readings from Last 3 Encounters:  02/26/19 85.5 kg (188 lb 6.4 oz)  02/08/19 81.2 kg (179 lb)  01/11/19 80.3 kg (177 lb)     PHYSICAL EXAM: General:  Well appearing. No resp difficulty HEENT: normal Neck: supple. JVP flat. Carotids 2+ bilaterally; no bruits. No lymphadenopathy or thryomegaly appreciated. Cor: PMI normal. Regular rate & rhythm. No rubs, gallops or murmurs. Lungs: clear Abdomen: soft, nontender, nondistended. No hepatosplenomegaly. No bruits or masses. Good bowel sounds. Extremities: no cyanosis, clubbing, rash, edema Neuro: alert & orientedx3, cranial nerves grossly intact. Moves all 4 extremities w/o difficulty. Affect pleasant.   ECG: SR frequent PVCs 70 bpm.    ASSESSMENT & PLAN:  1. Chronic Systolic HF, Biventricular HF.   LHC 2019 90% LAD and 50% circumflex --> S/P CABG x2 LIMA to LAD and SVG to OM  ECHO 2/202 EF 10-15% moderate RV dysfunction. CMRI 2/202 LVEF 21% Moderately reduced RV, moderateTR.  ECHO 02/2019 EF 20-25% RV severerly reduced  Set up CPX study.  NYHA IIIB. Volume status stable despite weight gain. Change lasix to 40 mg every other day.  - Having PVCs will place zio patch to quantify PVC burden.  - May need to start bb soon.  -Continue digoxin 0.125 mg daily.  -On goal dose entresto 97/103 twice a day  -On corlanor 7.5 mg twice a day.  -Referred to EP for ICD- narrow qrs.  - Set up CPX study.  -Check BMEt, BNP, CBC  2. H/O bilateral PE Continue xarelto  - Check CBC today   3. CAD S/P CABG 09/2018  LIMA to the LAD and an SVG to the OM on 12/6/19with Dr Roxan Hockey - No s/s ischemia  -Continue statin and xarelto.  4. Fibromyalgia  5. PVCs Frequent PVCs noted on EKG Place Zio Patch for 72 hours.   Follow up in 6 weeks with Dr Haroldine Laws. Check CBC, BMET, BNP today. Refer to EP for ICD Set  Up CPX . Down the road  we may need RHC to further assess hemodynamics and cardiac output.  Place Zio Patch today.    NP-C  10:12 AM

## 2019-02-26 NOTE — Telephone Encounter (Signed)
-----   Message from Conrad Edin[place], NP sent at 02/26/2019 12:22 PM EDT ----- Renal function and CBC ok. BNP is down. No change for now. Please let her know.

## 2019-02-28 ENCOUNTER — Encounter (HOSPITAL_COMMUNITY): Payer: Medicare PPO | Admitting: Internal Medicine

## 2019-02-28 ENCOUNTER — Other Ambulatory Visit (HOSPITAL_COMMUNITY): Payer: Medicare PPO

## 2019-03-02 ENCOUNTER — Other Ambulatory Visit (HOSPITAL_COMMUNITY): Payer: Self-pay | Admitting: Internal Medicine

## 2019-03-02 ENCOUNTER — Telehealth: Payer: Self-pay | Admitting: *Deleted

## 2019-03-02 NOTE — Telephone Encounter (Signed)
Calling patient today to discuss upcoming appointment.  We are currently trying to limit exposure to the virus that causes COVID-19 by seeing patients at home rather than in the office. We would like to schedule this appointment as a Psychologist, counselling. Unable to reach patient.  LVMTCB   Consent sent VIA MyChart

## 2019-03-02 NOTE — Telephone Encounter (Signed)

## 2019-03-05 ENCOUNTER — Other Ambulatory Visit: Payer: Self-pay

## 2019-03-05 ENCOUNTER — Telehealth (INDEPENDENT_AMBULATORY_CARE_PROVIDER_SITE_OTHER): Payer: Medicare PPO | Admitting: Cardiology

## 2019-03-05 ENCOUNTER — Encounter: Payer: Self-pay | Admitting: Cardiology

## 2019-03-05 DIAGNOSIS — I5022 Chronic systolic (congestive) heart failure: Secondary | ICD-10-CM | POA: Diagnosis not present

## 2019-03-05 DIAGNOSIS — Z951 Presence of aortocoronary bypass graft: Secondary | ICD-10-CM

## 2019-03-05 MED FILL — CORLANOR 7.5 MG TABLET: 7.5 | 30 days supply | Qty: 60 | Fill #1

## 2019-03-05 NOTE — Patient Instructions (Signed)
Medication Instructions:  Your physician recommends that you continue on your current medications as directed. Please refer to the Current Medication list given to you today.   Labwork: None ordered   Testing/Procedures: Will get the results of the 3 day Zio Patch you wore starting 5/11/2 from Dr. Ninfa Meeker and see if Dr. Curt Bears would like to order another for a longer period of time.     Follow-Up: Your physician recommends that you schedule a follow-up appointment in: 3 months with Dr. Curt Bears.     Any Other Special Instructions Will Be Listed Below (If Applicable).     If you need a refill on your cardiac medications before your next appointment, please call your pharmacy.

## 2019-03-05 NOTE — Progress Notes (Signed)
Virtual Visit via Video Note   This visit type was conducted due to national recommendations for restrictions regarding the COVID-19 Pandemic (e.g. social distancing) in an effort to limit this patient's exposure and mitigate transmission in our community.  Due to her co-morbid illnesses, this patient is at least at moderate risk for complications without adequate follow up.  This format is felt to be most appropriate for this patient at this time.  All issues noted in this document were discussed and addressed.  A limited physical exam was performed with this format.  Please refer to the patient's chart for her consent to telehealth for [facility].   Date:  03/05/2019   ID:  Jennifer [name], DOB 1956-05-08, MRN [id number]  Patient Location: Home Provider Location: Home  PCP:  Monico Blitz, MD  Cardiologist:  Kate Sable, MD, [place] Electrophysiologist:  Curt Bears   Evaluation Performed:  Consultation - [name] was referred by Darrick Grinder for the evaluation of nonischemic cardiomyopathy.  Chief Complaint: CHF  History of Present Illness:    Jennifer [name] is a 63 y.o. female with nonischemic cardiomyopathy, DVT, PE, fibromyalgia, and chronic systolic heart failure.  She had a heart catheterization 09/21/2018 that showed a 90% proximal LAD and a 50% circumflex stenosis.  She underwent CABG x2 09/22/2018.Currently she feels well.  She says that over the past few weeks he has done much better with less symptoms.  She feels like she has quite a bit of energy when she wakes up in the morning, but by 4:00 she does feel somewhat weak and fatigued.  Otherwise she does not have any chest pain and minimal shortness of breath.  The patient does not have symptoms concerning for COVID-19 infection (fever, chills, cough, or new shortness of breath).    Past Medical History:  Diagnosis Date  . Arthritis    "probably; maybe in my hands" (09/21/2018)  . Congestive heart failure (CHF) ([place])    . Dyspnea   . Fibromyalgia   . GERD (gastroesophageal reflux disease)   . Hypercholesteremia   . Hypertension   . IBS (irritable bowel syndrome)   . Mild sleep apnea    no CPAP (09/21/2018)  . Myocardial infarction [facility])    "was told I have had one; never knew about it" (09/21/2018)  . Nonischemic cardiomyopathy (HCC)    ECHO EF 40-45%, Septal Thickness,and mild global left ventricular dysfunction; stress test 03/2004 +VE, refersible defect; ECHO 03/2005 @[facility]  EF 55%  . Pulmonary embolus [facility])    Past Surgical History:  Procedure Laterality Date  . ANKLE FRACTURE SURGERY Left 2000  . BUNIONECTOMY Right 1990s?   "& removed part of my great toe due to fungus under nail"  . CARDIAC CATHETERIZATION  03/2005  . CORONARY ARTERY BYPASS GRAFT N/A 09/22/2018   Procedure: CORONARY ARTERY BYPASS GRAFTING (CABG), ON PUMP, TIMES TWO, USING LEFT INTERNAL MAMMARY ARTERY AND ENDOSCOPICALLY HARVESTED RIGHT GREATER SAPHENOUS VEIN;  Surgeon: Melrose Nakayama, MD;  Location: [facility];  Service: Open Heart Surgery;  Laterality: N/A;  . DILATION AND CURETTAGE OF UTERUS    . FRACTURE SURGERY    . RIGHT HEART CATH N/A 12/05/2018   Procedure: RIGHT HEART CATH;  Surgeon: Jolaine Artist, MD;  Location: [place] CV LAB;  Service: Cardiovascular;  Laterality: N/A;  . RIGHT/LEFT HEART CATH AND CORONARY ANGIOGRAPHY N/A 09/21/2018   Procedure: RIGHT/LEFT HEART CATH AND CORONARY ANGIOGRAPHY;  Surgeon: Troy Sine, MD;  Location: [place] CV LAB;  Service: Cardiovascular;  Laterality: N/A;  . TEE WITHOUT CARDIOVERSION N/A 09/22/2018   Procedure: TRANSESOPHAGEAL ECHOCARDIOGRAM (TEE);  Surgeon: Melrose Nakayama, MD;  Location: [facility];  Service: Open Heart Surgery;  Laterality: N/A;  . TUBAL LIGATION       Current Meds  Medication Sig  . aspirin EC 81 MG EC tablet Take 1 tablet (81 mg total) by mouth every evening.  Marland Kitchen atorvastatin (LIPITOR) 80 MG tablet Take 1 tablet (80 mg total) by mouth daily at 6  PM.  . cholecalciferol (VITAMIN D3) 25 MCG (1000 UT) tablet Take 1,000 Units by mouth daily.  . CORLANOR 7.5 MG TABS tablet TAKE 1 TABLET (7.5 MG TOTAL) BY MOUTH 2 (TWO) TIMES DAILY WITH A MEAL.  Marland Kitchen digoxin (LANOXIN) 0.125 MG tablet Take 1 tablet (0.125 mg total) by mouth daily.  . DULoxetine (CYMBALTA) 60 MG capsule Take 60 mg by mouth 2 (two) times daily.  . furosemide (LASIX) 40 MG tablet Take 1 tablet (40 mg total) by mouth every other day. May take 1 additional tab as needed for weight gain.  Marland Kitchen gabapentin (NEURONTIN) 300 MG capsule Take 600 mg by mouth 3 (three) times daily.  . pramipexole (MIRAPEX) 0.5 MG tablet Take 0.5 mg by mouth at bedtime.   . rivaroxaban (XARELTO) 20 MG TABS tablet Take 1 tablet (20 mg total) by mouth daily with breakfast.  . sacubitril-valsartan (ENTRESTO) 97-103 MG Take 1 tablet by mouth 2 (two) times daily.  Marland Kitchen spironolactone (ALDACTONE) 25 MG tablet Take 1 tablet (25 mg total) by mouth daily.  . vitamin B-12 (CYANOCOBALAMIN) 500 MCG tablet Take 1 tablet (500 mcg total) by mouth daily.  . vitamin C (ASCORBIC ACID) 500 MG tablet Take 500 mg by mouth daily.     Allergies:   Poison oak extract   Social History   Tobacco Use  . Smoking status: Former Smoker    Packs/day: 1.00    Years: 30.00    Pack years: 30.00    Types: Cigarettes    Last attempt to quit: 1992    Years since quitting: 28.3  . Smokeless tobacco: Never Used  Substance Use Topics  . Alcohol use: Not Currently    Frequency: Never  . Drug use: Never     Family Hx: The patient's family history includes Alcohol abuse in her father; Colon cancer in her father; Congestive Heart Failure (age of onset: 73) in her brother; Depression in her father; Diabetes Mellitus II in her mother; Heart disease (age of onset: 20) in her mother.  ROS:   Please see the history of present illness.     All other systems reviewed and are negative.   Prior CV studies:   The following studies were reviewed  today:  TTE 02/19/2019  1. The left ventricle has severely reduced systolic function, with an ejection fraction of 20-25%. The cavity size was severely dilated. Indeterminate diastolic filling due to E-A fusion.  2. The right ventricle has severely reduced systolic function. The cavity was mildly enlarged. There is no increase in right ventricular wall thickness.  3. Left atrial size was severely dilated.  4. Right atrial size was mildly dilated.  5. The aortic root is normal in size and structure.  6. The interatrial septum was not assessed.  Labs/Other Tests and Data Reviewed:    EKG:  An ECG dated 02/26/2019 was personally reviewed today and demonstrated:  Sinus rhythm, ventricular bigeminy  Recent Labs: 11/27/2018: ALT 71 12/07/2018: Magnesium 2.6; TSH 2.343  02/26/2019: B Natriuretic Peptide 351.3; BUN 21; Creatinine, Ser 1.00; Hemoglobin 14.5; Platelets 242; Potassium 4.6; Sodium 138   Recent Lipid Panel No results found for: CHOL, TRIG, HDL, CHOLHDL, LDLCALC, LDLDIRECT  Wt Readings from Last 3 Encounters:  02/26/19 188 lb 6.4 oz (85.5 kg)  02/08/19 179 lb (81.2 kg)  01/11/19 177 lb (80.3 kg)     Objective:    Vital Signs:  BP 122/62   Pulse 85    VITAL SIGNS:  reviewed GEN:  no acute distress EYES:  sclerae anicteric, EOMI - Extraocular Movements Intact RESPIRATORY:  normal respiratory effort, symmetric expansion CARDIOVASCULAR:  no peripheral edema SKIN:  no rash, lesions or ulcers. MUSCULOSKELETAL:  no obvious deformities. NEURO:  alert and oriented x 3, no obvious focal deficit PSYCH:  normal affect  ASSESSMENT & PLAN:    1. Chronic systolic heart failure due to nonischemic cardiomyopathy: Confirmed by cardiac MRI.  Has class II symptoms.  Currently on Entresto and Lasix.  She also takes Corlin or.  She was referred for evaluation of an ICD, though she is not on a beta-blocker.  I Carmeline Kowal discuss this with her referring to see whether or not she can tolerate a  beta-blocker and see if we need to see her back in the future for ICD therapy.  Otherwise the risks and benefits were discussed and include bleeding, tamponade, infection, pneumothorax.  The patient understands these risks and has agreed to ICD implantation. 2. Coronary artery disease status post CABG: LIMA to the LAD and vein to the OM.  Continue statin and Xarelto. 3. PVCs: Plan for a ZIO patch monitoring.  COVID-19 Education: The signs and symptoms of COVID-19 were discussed with the patient and how to seek care for testing (follow up with PCP or arrange E-visit).  The importance of social distancing was discussed today.  Time:   Today, I have spent 16 minutes with the patient with telehealth technology discussing the above problems.     Medication Adjustments/Labs and Tests Ordered: Current medicines are reviewed at length with the patient today.  Concerns regarding medicines are outlined above.   Tests Ordered: No orders of the defined types were placed in this encounter.   Medication Changes: No orders of the defined types were placed in this encounter.   Disposition:  Follow up in 3 month(s)  Signed, Torianna Junio Meredith Leeds, MD  03/05/2019 9:22 AM    [place]

## 2019-03-06 ENCOUNTER — Telehealth (HOSPITAL_COMMUNITY): Payer: Self-pay | Admitting: *Deleted

## 2019-03-06 NOTE — Telephone Encounter (Signed)
Pt left VM stating she cant afford Xarelto and needed help with patient assistance.   Message routed to [place] for assistance.

## 2019-03-07 ENCOUNTER — Telehealth (HOSPITAL_COMMUNITY): Payer: Self-pay | Admitting: Licensed Clinical Social Worker

## 2019-03-07 NOTE — Telephone Encounter (Signed)
Entered in error

## 2019-03-07 NOTE — Telephone Encounter (Signed)
CSW consulted due to patient mediation concerns.  Pt on xarelto but is now in the donut hole and per her pharmacy copay for 90day supply would be $380 which is not affordable for the patient.  CSW and pt had applied for assistance with Xarelto through [place] and [place] back in February but had not received notification from them.  CSW called foundation who reports pt is not approved until she has reached $640 in copay limit.  CSW called pt pharmacies who report her current spending is $560.33.  CSW requested clinic complete a tier exception for medication to see if this will lower cost  CSW will continue to follow and assist as needed  Jorge Ny, [facility] Desk#: [phone] Cell#: [phone]

## 2019-03-08 MED FILL — ENTRESTO 97 MG-103 MG TAB: 97-103 | 30 days supply | Qty: 60 | Fill #1

## 2019-03-08 NOTE — Telephone Encounter (Signed)
Called [employer] for tier exception for Xarelto.  Per representative, pt does not have ability for tiering exception based on insurance. She did recommend advising patient to call member services to speak with them to work something out with her. Called patient and made her aware. She will give [employer] a call and update office with outcome of conversation.

## 2019-03-09 ENCOUNTER — Other Ambulatory Visit (HOSPITAL_COMMUNITY): Payer: Self-pay | Admitting: *Deleted

## 2019-03-09 DIAGNOSIS — R079 Chest pain, unspecified: Secondary | ICD-10-CM | POA: Diagnosis not present

## 2019-03-09 DIAGNOSIS — I493 Ventricular premature depolarization: Secondary | ICD-10-CM

## 2019-03-13 ENCOUNTER — Telehealth (HOSPITAL_COMMUNITY): Payer: Self-pay | Admitting: Adult Health

## 2019-03-13 ENCOUNTER — Telehealth (HOSPITAL_COMMUNITY): Payer: Self-pay

## 2019-03-13 DIAGNOSIS — I5022 Chronic systolic (congestive) heart failure: Secondary | ICD-10-CM

## 2019-03-13 MED ORDER — CARVEDILOL 3.125 MG PO TABS: 3.1250 mg | ORAL_TABLET | Freq: Two times a day (BID) | ORAL | 11 refills | Status: DC

## 2019-03-13 NOTE — Addendum Note (Signed)
Addended by: Kerry Dory on: 03/13/2019 03:16 PM   Modules accepted: Orders

## 2019-03-13 NOTE — Telephone Encounter (Signed)
Dr Haroldine Laws reviewed [place] .    7.3% PVCs noted on Zio Patch.   We are going to start 3.125 mg carvedilol twice a day. [facility] [place]   Hold off on device and see if this helps. We will repeat ECHO  after 3 months.   We will set up for teleheath visit in 2 weeks.   Ms Momon verbalized understanding.   Amy Clegg NP-C  12:02 PM

## 2019-03-13 NOTE — Telephone Encounter (Signed)
Pt aware and voiced understanding Follow up 6/9 @ 1130 rx sent to pharm

## 2019-03-13 NOTE — Telephone Encounter (Signed)
Xarelto 20 mg approved coverage for prescription until 10/18/2019

## 2019-03-16 ENCOUNTER — Ambulatory Visit (HOSPITAL_COMMUNITY)
Admission: RE | Admit: 2019-03-16 | Discharge: 2019-03-16 | Disposition: A | Discharge status: Home / Self Care | Payer: Medicare PPO | Source: Ambulatory Visit | Attending: Internal Medicine | Admitting: Internal Medicine

## 2019-03-16 ENCOUNTER — Other Ambulatory Visit: Payer: Self-pay

## 2019-03-16 DIAGNOSIS — I493 Ventricular premature depolarization: Secondary | ICD-10-CM

## 2019-03-26 ENCOUNTER — Telehealth (HOSPITAL_COMMUNITY): Payer: Self-pay | Admitting: *Deleted

## 2019-03-26 NOTE — Telephone Encounter (Signed)
Called patient today to check on her and to see if she would still like to wait to come to the program when we re-open. She said she does want to wait, however, when we do open and if she feels strong enough she may decide at that time not to come. She does not want to do the virtual home based program.

## 2019-03-27 ENCOUNTER — Other Ambulatory Visit: Payer: Self-pay

## 2019-03-27 ENCOUNTER — Encounter (HOSPITAL_COMMUNITY): Payer: Self-pay

## 2019-03-27 ENCOUNTER — Ambulatory Visit (HOSPITAL_COMMUNITY)
Admission: RE | Admit: 2019-03-27 | Discharge: 2019-03-27 | Disposition: A | Discharge status: Home / Self Care | Payer: Medicare PPO | Source: Ambulatory Visit | Attending: Cardiology | Admitting: Cardiology

## 2019-03-27 VITALS — BP 123/84 | HR 81 | Wt 188.0 lb

## 2019-03-27 DIAGNOSIS — I255 Ischemic cardiomyopathy: Secondary | ICD-10-CM

## 2019-03-27 DIAGNOSIS — I2699 Other pulmonary embolism without acute cor pulmonale: Secondary | ICD-10-CM

## 2019-03-27 DIAGNOSIS — M797 Fibromyalgia: Secondary | ICD-10-CM

## 2019-03-27 DIAGNOSIS — I493 Ventricular premature depolarization: Secondary | ICD-10-CM

## 2019-03-27 DIAGNOSIS — I5022 Chronic systolic (congestive) heart failure: Secondary | ICD-10-CM

## 2019-03-27 MED ORDER — IVABRADINE HCL 7.5 MG PO TABS: ORAL_TABLET | ORAL | 6 refills | Status: DC

## 2019-03-27 MED ORDER — CARVEDILOL 6.25 MG PO TABS: 6.2500 mg | ORAL_TABLET | Freq: Two times a day (BID) | ORAL | 11 refills | Status: DC

## 2019-03-27 NOTE — Addendum Note (Signed)
Encounter addended by: Marlise Eves, RN on: 03/27/2019 11:42 AM  Actions taken: Pharmacy for encounter modified, Order list changed, Clinical Note Signed

## 2019-03-27 NOTE — Addendum Note (Signed)
Encounter addended by: Marlise Eves, RN on: 03/27/2019 11:44 AM  Actions taken: Pharmacy for encounter modified, Order list changed

## 2019-03-27 NOTE — Progress Notes (Signed)
Spoke with pt to review after visit summary. Refilled ivabradine. No further needs at this time. Pt aware and agreeable to f/u appointment.        Increase carvedilol 6.25 mg twice a day    Already has follow up with Dr Haroldine Laws on 6/24   Plan to check EKG and possibly a portable ECHO - Small hand held ECHO during OV.   Tks A

## 2019-03-27 NOTE — Patient Instructions (Addendum)
INCREASE Carvedilol to 6.25mg  (1 tab) twice daily. [facility].  REFILLED Ivabradine to [facility].  Please keep follow up appointment with Dr. Haroldine Laws on 04/11/19.  At the [facility], you and your health needs are our priority. As part of our continuing mission to provide you with exceptional heart care, we have created designated Provider Care Teams. These Care Teams include your primary Cardiologist (physician) and Advanced Practice Providers (APPs- Physician Assistants and Nurse Practitioners) who all work together to provide you with the care you need, when you need it.   You may see any of the following providers on your designated Care Team at your next follow up: Marland Kitchen Dr Glori Bickers . Dr Loralie Champagne . Darrick Grinder, NP

## 2019-03-27 NOTE — Progress Notes (Signed)
Heart Failure TeleHealth Note  Due to national recommendations of social distancing due to [place] 19, Audio/video telehealth visit is felt to be most appropriate for this patient at this time.  See MyChart message from today for patient consent regarding telehealth for [facility].  Date:  03/27/2019   ID:  Jennifer [name], DOB 02-09-1956, MRN [id number]  Location: Home  Provider location: [facility] Type of Visit: Established patient   PCP:  Monico Blitz, MD  Cardiologist:  Kate Sable, MD Primary HF: Dr Haroldine Laws  EP: Dr Curt Bears  Chief Complaint: Heart Failure   History of Present Illness: Jennifer [name] is a 63 y.o. female with a history of NICM, DVT, PE, fibromyalgia,and chronic systolic heart failure.  Catheterization done12/05/19revealed 90% proximal LAD and a 50% circumflex stenosis. She underwent bypass grafting x2 with an L IMA to the LAD and an SVG to the OM on 09/22/18 with Dr Roxan Hockey.  Zio Patch 7.3 % PVCs  Started carvedilol 03/13/2019 .  She  presents via Engineer, civil (consulting) for a telehealth visit today. Since the last visit she was started on carvedilol for PVC burden. Also had EP telehealth visit. Plan to to hold off on ICD until we try to suppress PVCs. Overall feeling fine.Mild SOB with inclines otherwise she denies shortness of breath. 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 Denies SOB/PND/Orthopnea. No bleeding problems. No chest pain.   Appetite ok. No fever or chills. Weight at home 185-188 pounds. Taking all medications.  she denies symptoms worrisome for COVID 19.   5/20202 Zio Patch 7.3 % PVCs  Started carvedilol 03/13/2019-   Echo  02/19/2019 EF 20-25% RV severely reduced  11/28/2018 EF 10-15% with moderate RV dysfunction.   cMRI 12/05/18 1. Moderately dilated LV with EF 21%, diffuse hypokinesis. 2. Normal RV size with moderately decreased systolic function, EF 49%. 3. Moderate TR, at least moderate central MR (likely  functional). 4. No myocardial LGE, so no evidence for prior infarct, infiltrative disease, or myocarditis.  RHC 12/05/18 On milrinone 0.125 RA = 8 RV = 37/10 PA = 39/15 (25) PCW = 19 (v = 35) Fick cardiac output/index = 5.0/2.8 PVR = 1.1 WU Ao sat = 94% PA sat = 66%, 66% PaPi = 3.0 RA/PCW = 0.42  Past Medical History:  Diagnosis Date  . Arthritis    "probably; maybe in my hands" (09/21/2018)  . Congestive heart failure (CHF) ([place])   . Dyspnea   . Fibromyalgia   . GERD (gastroesophageal reflux disease)   . Hypercholesteremia   . Hypertension   . IBS (irritable bowel syndrome)   . Mild sleep apnea    no CPAP (09/21/2018)  . Myocardial infarction [facility])    "was told I have had one; never knew about it" (09/21/2018)  . Nonischemic cardiomyopathy (HCC)    ECHO EF 40-45%, Septal Thickness,and mild global left ventricular dysfunction; stress test 03/2004 +VE, refersible defect; ECHO 03/2005 @[facility]  EF 55%  . Pulmonary embolus [facility])    Past Surgical History:  Procedure Laterality Date  . ANKLE FRACTURE SURGERY Left 2000  . BUNIONECTOMY Right 1990s?   "& removed part of my great toe due to fungus under nail"  . CARDIAC CATHETERIZATION  03/2005  . CORONARY ARTERY BYPASS GRAFT N/A 09/22/2018   Procedure: CORONARY ARTERY BYPASS GRAFTING (CABG), ON PUMP, TIMES TWO, USING LEFT INTERNAL MAMMARY ARTERY AND ENDOSCOPICALLY HARVESTED RIGHT GREATER SAPHENOUS VEIN;  Surgeon: Melrose Nakayama, MD;  Location: [place];  Service: Open  Heart Surgery;  Laterality: N/A;  . DILATION AND CURETTAGE OF UTERUS    . FRACTURE SURGERY    . RIGHT HEART CATH N/A 12/05/2018   Procedure: RIGHT HEART CATH;  Surgeon: Jolaine Artist, MD;  Location: [place] CV LAB;  Service: Cardiovascular;  Laterality: N/A;  . RIGHT/LEFT HEART CATH AND CORONARY ANGIOGRAPHY N/A 09/21/2018   Procedure: RIGHT/LEFT HEART CATH AND CORONARY ANGIOGRAPHY;  Surgeon: Troy Sine, MD;  Location: [place] CV LAB;  Service:  Cardiovascular;  Laterality: N/A;  . TEE WITHOUT CARDIOVERSION N/A 09/22/2018   Procedure: TRANSESOPHAGEAL ECHOCARDIOGRAM (TEE);  Surgeon: Melrose Nakayama, MD;  Location: [place];  Service: Open Heart Surgery;  Laterality: N/A;  . TUBAL LIGATION       Current Outpatient Medications  Medication Sig Dispense Refill  . aspirin EC 81 MG EC tablet Take 1 tablet (81 mg total) by mouth every evening. 30 tablet 0  . atorvastatin (LIPITOR) 80 MG tablet Take 1 tablet (80 mg total) by mouth daily at 6 PM. 30 tablet 1  . carvedilol (COREG) 3.125 MG tablet Take 1 tablet (3.125 mg total) by mouth 2 (two) times daily. 60 tablet 11  . cholecalciferol (VITAMIN D3) 25 MCG (1000 UT) tablet Take 1,000 Units by mouth daily.    . CORLANOR 7.5 MG TABS tablet TAKE 1 TABLET (7.5 MG TOTAL) BY MOUTH 2 (TWO) TIMES DAILY WITH A MEAL. 60 tablet 6  . digoxin (LANOXIN) 0.125 MG tablet Take 1 tablet (0.125 mg total) by mouth daily. 90 tablet 3  . DULoxetine (CYMBALTA) 60 MG capsule Take 60 mg by mouth 2 (two) times daily.    . furosemide (LASIX) 40 MG tablet Take 1 tablet (40 mg total) by mouth every other day. May take 1 additional tab as needed for weight gain. 120 tablet 3  . gabapentin (NEURONTIN) 300 MG capsule Take 600 mg by mouth 3 (three) times daily.    . pramipexole (MIRAPEX) 0.5 MG tablet Take 0.5 mg by mouth at bedtime.     . rivaroxaban (XARELTO) 20 MG TABS tablet Take 1 tablet (20 mg total) by mouth daily with breakfast. 30 tablet 0  . sacubitril-valsartan (ENTRESTO) 97-103 MG Take 1 tablet by mouth 2 (two) times daily. 180 tablet 3  . spironolactone (ALDACTONE) 25 MG tablet Take 1 tablet (25 mg total) by mouth daily. 90 tablet 3  . vitamin B-12 (CYANOCOBALAMIN) 500 MCG tablet Take 1 tablet (500 mcg total) by mouth daily. 30 tablet 0  . vitamin C (ASCORBIC ACID) 500 MG tablet Take 500 mg by mouth daily.     No current facility-administered medications for this encounter.     Allergies:   Poison oak  extract   Social History:  The patient  reports that she quit smoking about 28 years ago. Her smoking use included cigarettes. She has a 30.00 pack-year smoking history. She has never used smokeless tobacco. She reports previous alcohol use. She reports that she does not use drugs.   Family History:  The patient's family history includes Alcohol abuse in her father; Colon cancer in her father; Congestive Heart Failure (age of onset: 26) in her brother; Depression in her father; Diabetes Mellitus II in her mother; Heart disease (age of onset: 99) in her mother.   ROS:  Please see the history of present illness.   All other systems are personally reviewed and negative.  Vitals:   03/27/19 1111  BP: 123/84  Pulse: 81    Exam:  Tele Health Call; Exam is subjective  General:  Speaks in full sentences. No resp difficulty. Lungs: Normal respiratory effort with conversation.  Abdomen: Non-distended per patient report Extremities: Pt denies edema. Neuro: Alert & oriented x 3.   Recent Labs: 11/27/2018: ALT 71 12/07/2018: Magnesium 2.6; TSH 2.343 02/26/2019: B Natriuretic Peptide 351.3; BUN 21; Creatinine, Ser 1.00; Hemoglobin 14.5; Platelets 242; Potassium 4.6; Sodium 138  Personally reviewed   Wt Readings from Last 3 Encounters:  03/27/19 85.3 kg (188 lb)  02/26/19 85.5 kg (188 lb 6.4 oz)  02/08/19 81.2 kg (179 lb)      ASSESSMENT AND PLAN:  1. Chronic Systolic HF, Biventricular HF.   LHC 2019 90% LAD and 50% circumflex --> S/P CABG x2 LIMA to LAD and SVG to OM  ECHO 2/202 EF 10-15% moderate RV dysfunction. CMRI 2/202 LVEF 21% Moderately reduced RV, moderateTR.  ECHO 02/2019 EF 20-25% RV severerly reduced  - Established with Dr Lavonna Monarch to to hold off on ICD until we try to suppress PVCs.   PVC Burden 7.3%.  -Increase coreg 6.25 mg twice a day.   -Continue digoxin 0.125 mg daily.  -On goal dose entresto 97/103 twice a day  -On corlanor 7.5 mg twice a day.  - Set up CPX study.  -  Plan to check an ECHO in the office with Dr Haroldine Laws the end of the month.   2. H/O bilateral PE Continue xarelto  No bleeding issues.   3. CAD S/P CABG 09/2018       LIMA to the LAD and an SVG to the OM on 12/6/19with Dr Roxan Hockey - No chest pain.        -Continue statin and xarelto.  4. Fibromyalgia  5. PVCs Zio Patch with 7.3% PVCs. Started on carvedilol 3.125 mg twice a day 03/12/29.  Increasing carvedilol as noted above.      COVID screen The patient does not have any symptoms that suggest any further testing/ screening at this time.  Social distancing reinforced today.  Patient Risk: After full review of this patients clinical status, I feel that they are at moderate risk for cardiac decompensation at this time.  Relevant cardiac medications were reviewed at length with the patient today. The patient does not have concerns regarding their medications at this time.   The following changes were made today:  Increase carvedilol 6.25 mg twice a day  Recommended follow-up:  The end of the month with Dr Haroldine Laws. Plan to check EKG and possibly a portable ECHO.   Today, I have spent 11 minutes with the patient with telehealth technology discussing the above issues .    Jeanmarie Hubert, NP  03/27/2019 11:16 AM  [address] [address] [phone] (office) [phone] (fax)

## 2019-03-28 MED FILL — CORLANOR 7.5 MG TABLET: 7.5 | 30 days supply | Qty: 60 | Fill #0

## 2019-04-11 ENCOUNTER — Ambulatory Visit (HOSPITAL_COMMUNITY)
Admission: RE | Admit: 2019-04-11 | Discharge: 2019-04-11 | Disposition: A | Discharge status: Home / Self Care | Payer: Medicare PPO | Source: Ambulatory Visit | Attending: Internal Medicine | Admitting: Internal Medicine

## 2019-04-11 ENCOUNTER — Other Ambulatory Visit: Payer: Self-pay

## 2019-04-11 ENCOUNTER — Encounter (HOSPITAL_COMMUNITY): Payer: Self-pay | Admitting: Internal Medicine

## 2019-04-11 VITALS — BP 120/82 | HR 67 | Wt 188.2 lb

## 2019-04-11 DIAGNOSIS — Z86718 Personal history of other venous thrombosis and embolism: Secondary | ICD-10-CM | POA: Diagnosis not present

## 2019-04-11 DIAGNOSIS — R0683 Snoring: Secondary | ICD-10-CM | POA: Diagnosis not present

## 2019-04-11 DIAGNOSIS — I252 Old myocardial infarction: Secondary | ICD-10-CM | POA: Diagnosis not present

## 2019-04-11 DIAGNOSIS — I25810 Atherosclerosis of coronary artery bypass graft(s) without angina pectoris: Secondary | ICD-10-CM | POA: Diagnosis not present

## 2019-04-11 DIAGNOSIS — Z800 Family history of malignant neoplasm of digestive organs: Secondary | ICD-10-CM | POA: Diagnosis not present

## 2019-04-11 DIAGNOSIS — Z7901 Long term (current) use of anticoagulants: Secondary | ICD-10-CM | POA: Insufficient documentation

## 2019-04-11 DIAGNOSIS — M797 Fibromyalgia: Secondary | ICD-10-CM | POA: Diagnosis not present

## 2019-04-11 DIAGNOSIS — Z951 Presence of aortocoronary bypass graft: Secondary | ICD-10-CM | POA: Diagnosis not present

## 2019-04-11 DIAGNOSIS — Z79899 Other long term (current) drug therapy: Secondary | ICD-10-CM | POA: Diagnosis not present

## 2019-04-11 DIAGNOSIS — K219 Gastro-esophageal reflux disease without esophagitis: Secondary | ICD-10-CM | POA: Diagnosis not present

## 2019-04-11 DIAGNOSIS — M1990 Unspecified osteoarthritis, unspecified site: Secondary | ICD-10-CM | POA: Insufficient documentation

## 2019-04-11 DIAGNOSIS — I5022 Chronic systolic (congestive) heart failure: Secondary | ICD-10-CM | POA: Insufficient documentation

## 2019-04-11 DIAGNOSIS — I110 Hypertensive heart disease with heart failure: Secondary | ICD-10-CM | POA: Diagnosis not present

## 2019-04-11 DIAGNOSIS — Z833 Family history of diabetes mellitus: Secondary | ICD-10-CM | POA: Insufficient documentation

## 2019-04-11 DIAGNOSIS — Z7982 Long term (current) use of aspirin: Secondary | ICD-10-CM | POA: Insufficient documentation

## 2019-04-11 DIAGNOSIS — Z91048 Other nonmedicinal substance allergy status: Secondary | ICD-10-CM | POA: Insufficient documentation

## 2019-04-11 DIAGNOSIS — I493 Ventricular premature depolarization: Secondary | ICD-10-CM | POA: Diagnosis not present

## 2019-04-11 DIAGNOSIS — Z8249 Family history of ischemic heart disease and other diseases of the circulatory system: Secondary | ICD-10-CM | POA: Insufficient documentation

## 2019-04-11 DIAGNOSIS — E7800 Pure hypercholesterolemia, unspecified: Secondary | ICD-10-CM | POA: Diagnosis not present

## 2019-04-11 DIAGNOSIS — I428 Other cardiomyopathies: Secondary | ICD-10-CM | POA: Insufficient documentation

## 2019-04-11 DIAGNOSIS — I509 Heart failure, unspecified: Secondary | ICD-10-CM

## 2019-04-11 DIAGNOSIS — Z86711 Personal history of pulmonary embolism: Secondary | ICD-10-CM | POA: Insufficient documentation

## 2019-04-11 DIAGNOSIS — Z87891 Personal history of nicotine dependence: Secondary | ICD-10-CM | POA: Diagnosis not present

## 2019-04-11 LAB — BASIC METABOLIC PANEL
Anion gap: 9 (ref 5–15)
BUN: 22 mg/dL (ref 8–23)
CO2: 25 mmol/L (ref 22–32)
Calcium: 9.4 mg/dL (ref 8.9–10.3)
Chloride: 105 mmol/L (ref 98–111)
Creatinine, Ser: 0.98 mg/dL (ref 0.44–1.00)
GFR calc Af Amer: >60 mL/min (ref >60)
GFR calc non Af Amer: >60 mL/min (ref >60)
Glucose, Bld: 103 mg/dL — ABNORMAL HIGH (ref 70–99)
Potassium: 4.3 mmol/L (ref 3.5–5.1)
Sodium: 139 mmol/L (ref 135–145)

## 2019-04-11 LAB — MAGNESIUM: Magnesium: 2.2 mg/dL (ref 1.7–2.4)

## 2019-04-11 NOTE — Progress Notes (Signed)
Patient Name: Loleta Frommelt          DOB: 01/30/1956      Height:     Weight:  Office Name: [facility]         Referring Provider: Glori Bickers  Today's Date:04/11/19  Date:   STOP BANG RISK ASSESSMENT S (snore) Have you been told that you snore?     YES   T (tired) Are you often tired, fatigued, or sleepy during the day?   YES  O (obstruction) Do you stop breathing, choke, or gasp during sleep? NO   P (pressure) Do you have or are you being treated for high blood pressure? YES   B (BMI) Is your body index greater than 35 kg/m? NO   A (age) Are you 50 years old or older? YES   N (neck) Do you have a neck circumference greater than 16 inches?   NO   G (gender) Are you a female? NO   TOTAL STOP/BANG "YES" ANSWERS                                                                        For Office Use Only              Procedure Order Form    YES to 3+ Stop Bang questions OR two clinical symptoms - patient qualifies for WatchPAT (CPT 95800)     Submit: This Form + Patient Face Sheet + Clinical Note via CloudPAT or Fax: [phone]         Clinical Notes: Will consult Sleep Specialist and refer for management of therapy due to patient increased risk of Sleep Apnea. Ordering a sleep study due to the following two clinical symptoms: Excessive daytime sleepiness G47.10   I understand that I am proceeding with a home sleep apnea test as ordered by my treating physician. I understand that untreated sleep apnea is a serious cardiovascular risk factor and it is my responsibility to perform the test and seek management for sleep apnea. I will be contacted with the results and be managed for sleep apnea by a local sleep physician. I will be receiving equipment and further instructions from [facility]. I shall promptly ship back the equipment via the included mailing label. I understand my insurance will be billed for the test and as the patient I am responsible for any  insurance related out-of-pocket costs incurred. I have been provided with written instructions and can call for additional video or telephonic instruction, with 24-hour availability of qualified personnel to answer any questions: Patient Help Desk [phone].  Patient Signature ______________________________________________________   Date______________________ Patient Telemedicine Verbal Consent

## 2019-04-11 NOTE — Progress Notes (Signed)
Advanced Heart Failure Clinic Note  Due to national recommendations of social distancing due to [place], Audio/video telehealth visit is felt to be most appropriate for this patient at this time.  See MyChart message from today for patient consent regarding telehealth for [facility]  Date:  04/11/2019   ID:  Jennifer [name], DOB 1955/12/22, MRN [id number]  Location: Home  Provider location: [place] [facility] Type of Visit: Established patient   PCP:  Monico Blitz, MD  Cardiologist:  Kate Sable, MD Primary HF: Dr Haroldine Laws  EP: Dr Curt Bears  Chief Complaint: Heart Failure    History of Present Illness:  Jennifer [name] is a 63 y.o. female with a history of NICM, DVT, PE, fibromyalgia,and chronic systolic heart failure.  Catheterization done12/05/19revealed 90% proximal LAD and a 50% circumflex stenosis. She underwent bypass grafting x2 with an L IMA to the LAD and an SVG to the OM on 09/22/18 with Dr Roxan Hockey.  Zio Patch 5/20 7.3 % PVCs 1. Predominant underlying rhythm was Sinus Rhythm. Minimum HR of 56 bpm, max HR of 176 bpm, and avg HR of 77 bpm. 2. Three runs of NSVT - longest 10 beats 3. Seven runs of SVT - longest lasting 9 beats   Started carvedilol 03/13/2019 .  Recently had a televist and carvedilol increased to 6.25mg  bid. Referred to EP for consideration of ICD but suggested starting a b-blocker first. Says shes is feeling great. Denies SOB, orthopnea or PND.  Riding her tractor and doing all her activities without problem. No edema. Weight stable. Tolerating meds well   she denies symptoms worrisome for COVID 19.     Echo  02/19/2019 EF 20-25% RV severely reduced  11/28/2018 EF 10-15% with moderate RV dysfunction.   cMRI 12/05/18 1. Moderately dilated LV with EF 21%, diffuse hypokinesis. 2. Normal RV size with moderately decreased systolic function, EF 61%. 3. Moderate TR, at least moderate central MR (likely functional). 4. No  myocardial LGE, so no evidence for prior infarct, infiltrative disease, or myocarditis.  RHC 12/05/18 On milrinone 0.125 RA = 8 RV = 37/10 PA = 39/15 (25) PCW = 19 (v = 35) Fick cardiac output/index = 5.0/2.8 PVR = 1.1 WU Ao sat = 94% PA sat = 66%, 66% PaPi = 3.0 RA/PCW = 0.42  Past Medical History:  Diagnosis Date  . Arthritis    "probably; maybe in my hands" (09/21/2018)  . Congestive heart failure (CHF) ([place])   . Dyspnea   . Fibromyalgia   . GERD (gastroesophageal reflux disease)   . Hypercholesteremia   . Hypertension   . IBS (irritable bowel syndrome)   . Mild sleep apnea    no CPAP (09/21/2018)  . Myocardial infarction [facility])    "was told I have had one; never knew about it" (09/21/2018)  . Nonischemic cardiomyopathy (HCC)    ECHO EF 40-45%, Septal Thickness,and mild global left ventricular dysfunction; stress test 03/2004 +VE, refersible defect; ECHO 03/2005 @[facility]  EF 55%  . Pulmonary embolus [facility])    Past Surgical History:  Procedure Laterality Date  . ANKLE FRACTURE SURGERY Left 2000  . BUNIONECTOMY Right 1990s?   "& removed part of my great toe due to fungus under nail"  . CARDIAC CATHETERIZATION  03/2005  . CORONARY ARTERY BYPASS GRAFT N/A 09/22/2018   Procedure: CORONARY ARTERY BYPASS GRAFTING (CABG), ON PUMP, TIMES TWO, USING LEFT INTERNAL MAMMARY ARTERY AND ENDOSCOPICALLY HARVESTED RIGHT GREATER SAPHENOUS VEIN;  Surgeon: Melrose Nakayama, MD;  Location: [facility];  Service: Open Heart Surgery;  Laterality: N/A;  . DILATION AND CURETTAGE OF UTERUS    . FRACTURE SURGERY    . RIGHT HEART CATH N/A 12/05/2018   Procedure: RIGHT HEART CATH;  Surgeon: Jolaine Artist, MD;  Location: [place] CV LAB;  Service: Cardiovascular;  Laterality: N/A;  . RIGHT/LEFT HEART CATH AND CORONARY ANGIOGRAPHY N/A 09/21/2018   Procedure: RIGHT/LEFT HEART CATH AND CORONARY ANGIOGRAPHY;  Surgeon: Troy Sine, MD;  Location: [place] CV LAB;  Service: Cardiovascular;   Laterality: N/A;  . TEE WITHOUT CARDIOVERSION N/A 09/22/2018   Procedure: TRANSESOPHAGEAL ECHOCARDIOGRAM (TEE);  Surgeon: Melrose Nakayama, MD;  Location: [place];  Service: Open Heart Surgery;  Laterality: N/A;  . TUBAL LIGATION       Current Outpatient Medications  Medication Sig Dispense Refill  . aspirin EC 81 MG EC tablet Take 1 tablet (81 mg total) by mouth every evening. 30 tablet 0  . atorvastatin (LIPITOR) 80 MG tablet Take 1 tablet (80 mg total) by mouth daily at 6 PM. 30 tablet 1  . carvedilol (COREG) 6.25 MG tablet Take 1 tablet (6.25 mg total) by mouth 2 (two) times daily. 60 tablet 11  . cholecalciferol (VITAMIN D3) 25 MCG (1000 UT) tablet Take 1,000 Units by mouth daily.    . digoxin (LANOXIN) 0.125 MG tablet Take 1 tablet (0.125 mg total) by mouth daily. 90 tablet 3  . DULoxetine (CYMBALTA) 60 MG capsule Take 60 mg by mouth 2 (two) times daily.    . furosemide (LASIX) 40 MG tablet Take 1 tablet (40 mg total) by mouth every other day. May take 1 additional tab as needed for weight gain. 120 tablet 3  . gabapentin (NEURONTIN) 300 MG capsule Take 600 mg by mouth 3 (three) times daily.    . ivabradine (CORLANOR) 7.5 MG TABS tablet TAKE 1 TABLET (7.5 MG TOTAL) BY MOUTH 2 (TWO) TIMES DAILY WITH A MEAL. 60 tablet 6  . pramipexole (MIRAPEX) 0.5 MG tablet Take 0.5 mg by mouth at bedtime.     . rivaroxaban (XARELTO) 20 MG TABS tablet Take 1 tablet (20 mg total) by mouth daily with breakfast. 30 tablet 0  . sacubitril-valsartan (ENTRESTO) 97-103 MG Take 1 tablet by mouth 2 (two) times daily. 180 tablet 3  . spironolactone (ALDACTONE) 25 MG tablet Take 1 tablet (25 mg total) by mouth daily. 90 tablet 3  . vitamin B-12 (CYANOCOBALAMIN) 500 MCG tablet Take 1 tablet (500 mcg total) by mouth daily. 30 tablet 0  . vitamin C (ASCORBIC ACID) 500 MG tablet Take 500 mg by mouth daily.     No current facility-administered medications for this encounter.     Allergies:   Poison oak extract    Social History:  The patient  reports that she quit smoking about 28 years ago. Her smoking use included cigarettes. She has a 30.00 pack-year smoking history. She has never used smokeless tobacco. She reports previous alcohol use. She reports that she does not use drugs.   Family History:  The patient's family history includes Alcohol abuse in her father; Colon cancer in her father; Congestive Heart Failure (age of onset: 78) in her brother; Depression in her father; Diabetes Mellitus II in her mother; Heart disease (age of onset: 18) in her mother.   ROS:  Please see the history of present illness.   All other systems are personally reviewed and negative.  Vitals:   04/11/19 1251  BP: 120/82  Pulse: 67  SpO2: 95%    Exam:   General:  Well appearing. No resp difficulty HEENT: normal Neck: supple. no JVD. Carotids 2+ bilat; no bruits. No lymphadenopathy or thryomegaly appreciated. Cor: PMI nondisplaced. Regular rate & rhythm. No rubs, gallops or murmurs. Lungs: clear Abdomen: soft, nontender, nondistended. No hepatosplenomegaly. No bruits or masses. Good bowel sounds. Extremities: no cyanosis, clubbing, rash, edema Neuro: alert & orientedx3, cranial nerves grossly intact. moves all 4 extremities w/o difficulty. Affect pleasant   Recent Labs: 11/27/2018: ALT 71 12/07/2018: Magnesium 2.6; TSH 2.343 02/26/2019: B Natriuretic Peptide 351.3; BUN 21; Creatinine, Ser 1.00; Hemoglobin 14.5; Platelets 242; Potassium 4.6; Sodium 138  Personally reviewed   Wt Readings from Last 3 Encounters:  04/11/19 85.4 kg (188 lb 3.2 oz)  03/27/19 85.3 kg (188 lb)  02/26/19 85.5 kg (188 lb 6.4 oz)    ECG: NSR 71 no pvcs Personally reviewed   ASSESSMENT AND PLAN:   1. Chronic Systolic HF, Biventricular HF.  -  Suspect PVC CM  - LHC 2019 90% LAD and 50% circumflex --> S/P CABG x2 LIMA to LAD and SVG to OM   - ECHO 2/202 EF 10-15% moderate RV dysfunction. CMRI 2/202 LVEF 21% Moderately reduced RV,  moderateTR.  - ECHO 02/2019 EF 20-25% RV severerly reduced - Established with Dr Lavonna Monarch to to hold off on ICD until we try to suppress PVCs.  - PVC Burden 7.3%.  -cMRI 12/05/18 1. Moderately dilated LV with EF 21%, diffuse hypokinesis. 2. Normal RV size with moderately decreased systolic function, EF 00%. 3. Moderate TR, at least moderate central MR (likely functional).       4. No myocardial LGE, so no evidence for prior infarct, infiltrative disease, or myocarditis. - NYHA I-II. Volume status ok  -Continue coreg 6.25 mg twice a day.   -Continue digoxin 0.125 mg daily.  -On goal dose entresto 97/103 twice a day  -On corlanor 7.5 mg twice a day.  -On spiro 25 daily  -Will repeat echo in 2 months. If EF not improved repeat ziopatch. If PVC burden > 5% consider amio   2. H/O bilateral PE - Continue xarelto -No bleeding issues.   3. CAD S/P CABG 09/2018 - LIMA to the LAD and an SVG to the OM on 12/6/19with Dr Roxan Hockey - No chest pain. -Continue statin and xarelto.  4. Fibromyalgia  5. PVCs - Zio Patch 5/20 with 7.3% PVCs.  - Will need sleep study -Continue carvedilol   6. Snoring - check home sleep study    Signed, Glori Bickers, MD  04/11/2019 1:15 PM  [place] [address] [facility] and [place] [address] [phone] (office) [phone] (fax)

## 2019-04-11 NOTE — Patient Instructions (Signed)
Lab work done today. We will notify you of any abnormal lab work. No news is good news!  Your provider has recommended that you have a home sleep study.  Jennifer [name] is the company that provides these and will send the equipment right to your home with instructions on how to set it up.  Once you have completed the test you just dispose of the equipment, the information is automatically uploaded to [place].  If your test was positive and you need a home CPAP machine you will be contacted by Dr Theodosia Blender office [facility]) to set this up.  Your physician has requested that you have an echocardiogram. Echocardiography is a painless test that uses sound waves to create images of your heart. It provides your doctor with information about the size and shape of your heart and how well your heart's chambers and valves are working. This procedure takes approximately one hour. There are no restrictions for this procedure.   Please follow up with the [facility] in 2 months with an echocardiogram.  At the [facility], you and your health needs are our priority. As part of our continuing mission to provide you with exceptional heart care, we have created designated Provider Care Teams. These Care Teams include your primary Cardiologist (physician) and Advanced Practice Providers (APPs- Physician Assistants and Nurse Practitioners) who all work together to provide you with the care you need, when you need it.   You may see any of the following providers on your designated Care Team at your next follow up: Marland Kitchen Dr Glori Bickers . Dr Loralie Champagne . Darrick Grinder, NP

## 2019-04-12 ENCOUNTER — Encounter (HOSPITAL_COMMUNITY): Payer: Medicare PPO | Admitting: Internal Medicine

## 2019-04-20 MED FILL — ENTRESTO 97 MG-103 MG TAB: 97-103 | 30 days supply | Qty: 60 | Fill #2

## 2019-04-23 ENCOUNTER — Encounter (INDEPENDENT_AMBULATORY_CARE_PROVIDER_SITE_OTHER): Payer: Medicare PPO | Admitting: Cardiology

## 2019-04-23 DIAGNOSIS — R0683 Snoring: Secondary | ICD-10-CM | POA: Diagnosis not present

## 2019-04-23 DIAGNOSIS — G4733 Obstructive sleep apnea (adult) (pediatric): Secondary | ICD-10-CM | POA: Diagnosis not present

## 2019-04-23 DIAGNOSIS — I10 Essential (primary) hypertension: Secondary | ICD-10-CM | POA: Diagnosis not present

## 2019-04-23 DIAGNOSIS — G4730 Sleep apnea, unspecified: Secondary | ICD-10-CM | POA: Diagnosis not present

## 2019-04-23 DIAGNOSIS — G4710 Hypersomnia, unspecified: Secondary | ICD-10-CM | POA: Diagnosis not present

## 2019-04-27 ENCOUNTER — Ambulatory Visit: Payer: PRIVATE HEALTH INSURANCE

## 2019-04-27 ENCOUNTER — Telehealth: Payer: PRIVATE HEALTH INSURANCE

## 2019-04-27 DIAGNOSIS — R6889 Other general symptoms and signs: Secondary | ICD-10-CM

## 2019-04-27 DIAGNOSIS — Z0000 Encounter for general adult medical examination without abnormal findings: Secondary | ICD-10-CM

## 2019-04-27 NOTE — Patient Instructions
Daily omeprazole for now  See you for a swab    Diet for Vomiting or Diarrhea (Adult)    Your symptoms may return or get worse after eating certain foods listed below. If this happens, stop eating these foods until your symptoms ease and you feel better.  Once the vomiting stops, follow the steps below.  During the first 12 to 24 hours  During the first 12 to 24 hours, follow this diet:   Drinks.Plain water, sport drinks like electrolyte solutions, soft drinks without caffeine, mineral water (plain or flavored), clear fruit juices, and decaffeinated tea and coffee.   Soups.Clear broth.   Desserts. Plain gelatin, popsicles, and fruit juice bars. As you feel better, you may add 6to 8 ounces of yogurt per day. If you have diarrhea, don't havefoods or drinks that contain sugar, high-fructose corn syrup, or sugar alcohols.  During the next 24 hours  During the next 24 hours you may add the following to the above:   Hot cereal, plain toast, bread, rolls, and crackers   Plain noodles, rice, mashed potatoes, and chicken noodle or rice soup   Unsweetened canned fruit (but not pineapple) and bananas  Don't eat more than15 grams of fat a day. Do this by staying away from margarine, butter, oils, mayonnaise, sauces, gravies, fried foods, peanut butter, meat, poultry, and fish.  Don't eat much fiber. Stay away fromraw or cooked vegetables, fresh fruits (except bananas), and bran cereals.  Limit how muchcaffeine and chocolate you have. Do not use anyspices or seasonings except salt.  During the next 24 hours  Slowly go back to your normal diet, as you feel better and your symptoms ease.  Date Last Reviewed: 05/19/2015   2000-2018 [employer]. [address] [place] [address]. All rights reserved. This information is not intended as a substitute for professional medical care. Always follow your healthcare professional's instructions.

## 2019-04-27 NOTE — Progress Notes
[facility] HEALTH TELEMEDICINE COVID-19 (VIDEO) NOTE  Patient: Kerry Baxter  MRN: [id number]  Date of Service: 04/27/2019  Primary Care Physician: Gregary Cromer., MD  CC: Concern for COVID-19 and associated symptoms    History of Present Illness:   Kerry Baxter is a 63 y.o. female presenting with concern for COVID-19 and associated symptoms.    Symptom history, exposure history, and other risk factors detailed below:  Symptoms started 14 days ago.  Has very loose stool every morning for two weeks and recently some ''buzzing'' sensation in her abdomen.  No N/V, fever.  She has some GERD and took Prilosec last night.  Has eaten pumpkin seeds a v couple of times but otherwise no dietary changes to explain this change. She take a dose of Imodium after each loose stool.    High Risk Symptoms  []  Cough  []  Fever  []  Shortness of Breath  [x]  GI symptoms (abdominal pain, diarrhea)    Risk Factors  []  Exposure to confirmed COVID-19 within 14 days prior to symptom onset.  []  Research scientist (physical sciences)  []  Immunocompromised or with high risk medical condition  []  60 years or older  []  Lives with others who are immunocompromised or with high risk medical condition  []  Group Living home (nursing home, dorms, etc)    Review of Systems  Check if present, blank if patient denies symptoms:  []  fever, []  chills, []  nausea, []  emesis, []  po intolerance, []  change in mentation, []  SOB, [x]  abdominal pain, []  any significant change in bowel function. All other systems negative except as documented in HPI.    History:     Current Outpatient Medications:   ???  Melatonin 3 MG CAPS  Allergies   Allergen Reactions   ??? Garlic    ??? Sulfa Antibiotics      Rash       Patient Active Problem List   Diagnosis   ??? Hyperlipidemia   ??? Insomnia   ??? Routine general medical examination at a health care facility   ??? Preventative health care   ??? SK (seborrheic keratosis)   ??? End of life care       Past medical history, past surgical history, family history, and social history all reviewed, updated, and noted in HPI if significant.     Physical Exam:     (The following were examined and were normal unless noted otherwise)  Gen  [x]  Appearance  []  Alert    Eyes  [x]  Conjunctiva  []  Pupils  [x]  Sclera   ENT  [x]  Oropharynx  [x]  Nasal     Resp  [x]  Effort     Skin  [x]  Color  [x]  No rashes    Neuro  [x]  Oriented  [x]   Speech  [x]  Movement     Pertinent + findings (if any):   none    Assessment & Plan:   Kerry Baxter is a 63 y.o. female presenting with concern for COVID-19 and associated symptoms.    Diagnoses and all orders for this visit:    Suspected Covid-19 Virus Infection  -     COVID-19 PCR, Nasopharyngeal; Future      Orders Placed This Encounter   ??? COVID-19 PCR, Nasopharyngeal   ??? Melatonin 3 MG CAPS     [x]  Patient referred for COVID testing.  []  Patient directed to go immediately to the local Emergency Department for further evaluation and possible hospitalization.  []  Patient provided with information regarding the need to isolate  at home, social distancing, practice strict hygiene, and monitor for signs and symptoms of worsening health conditions including respiratory difficulties.  [x]  Additional education provided to patient on symptom management and follow up should they develop worsening symptoms including respiratory difficulties. Patient was also advised to inform healthcare providers and emergency responders of their current screening status should additional treatment be needed.  []  Risks/benefits of medications discussed.  []  >50% of this  visit was spent on direct patient care, including counseling and/or coordination of care for the problems listed within my assessment and plan.    The plan of care, diagnosis, orders, risks and benefits related to the medical issues pertinent to this encounter, and follow-up recommendations were discussed with the patient. Questions related to the recommended plan of care were answered. See AVS for additional information and counseling materials provided to the patient.    Time of note filed does not necessarily reflect the time of encounter.  Portions of this note may have been created with voice recognition software. Occasional wrong-word or ''sound-alike'' substitutions may have occurred due to the inherent limitations of voice recognition software. Please read the chart carefully and recognize, using context, where these substitutions have occurred.    Patient Consent to Telehealth Questionnaire   [facility] TELEHEALTH PRECHECKIN QUESTIONS 04/27/2019   By clicking ''I Agree'', I consent to the below:  I Agree     - I agree  to be treated via a video visit and acknowledge that I may be liable for any relevant copays or coinsurance depending on my insurance plan.  - I understand that this video visit is offered for my convenience and I am able to cancel and reschedule for an in-person appointment if I desire.  - I also acknowledge that sensitive medical information may be discussed during this video visit appointment and that it is my responsibility to locate myself in a location that ensures privacy to my own level of comfort.  - I also acknowledge that I should not be participating in a video visit in a way that could cause danger to myself or to those around me (such as driving or walking).  If my provider is concerned about my safety, I understand that they have the right to terminate the visit.     --  Author:  Huey Bienenstock, MD  04/27/2019 at 4:05 PM

## 2019-04-28 LAB — COVID-19 PCR

## 2019-04-29 ENCOUNTER — Encounter: Payer: Self-pay | Admitting: Cardiology

## 2019-04-29 ENCOUNTER — Ambulatory Visit: Payer: Medicare PPO

## 2019-04-29 DIAGNOSIS — I509 Heart failure, unspecified: Secondary | ICD-10-CM

## 2019-04-29 NOTE — Progress Notes (Signed)
This encounter was created in error - please disregard. This encounter was created in error - please disregard. This encounter was created in error - please disregard. 

## 2019-04-29 NOTE — Procedures (Signed)
    Sleep Study Report  Patient Information First Name: Jennifer Igoe Last Name: [name]  ID: [id number] Birth Date: 1956/04/29  Age: 63  Gender: Female  Sleep Study Information Study Date:04/23/2019 Referring Physician Information  TEST DESCRIPTION: Home sleep apnea testing was completed using the WatchPat, a Type 1 device, utilizing peripheral arterial tonometry (PAT), chest movement, actigraphy, pulse oximetry, pulse rate, body position and snore. AHI was calculated with apnea and hypopnea using valid sleep time as the denominator. RDI includes apneas, hypopneas, and RERAs. The data acquired and the scoring of sleep and all associated events were performed in accordance with the recommended standards and specifications as outlined in the AASM Manual for the Scoring of Sleep and Associated Events 2.2.0 (2015).  DIAGNOSIS: 1. Mild Obstructive Sleep Apnea (G47.33) with AHI 7.2/hr. 2. No Central Sleep Apnea noted (pAHIc 0.4/hr). 3. Nocturnal Hypoxemia - lowest O2 sat 85%. The time spent with O2 sats<88% was 8.6 minutes. 4. Moderate snoring noted. 5. The average heart rate was 69bpm. 6. Normal sleep onset and REM onset latency.  Recommendations 1. Findings are consistent with mild OSA. For symptomatic mild obstructive sleep apnea in setting of LV dysfunction and CHF recommend treatment with  continuous positive airway pressure ( CPAP). An auto CPAP from 5-20cm H2O with heated humidity and mask of choice is recommended.    2.  Other therapeutic options include:   a. The patient may benefit from the use of a nocturnal mandibular repositioning appliance. If that line of therapy is to be pursued the patient should be evaluated by a dentist trained in the treatment of sleep related breathing disorders.   b. An ENT consultation which may be useful for specific causes of obstruction and possible treatment options .  2. Weight loss may be of benefit in reducing the severity of respiratory events  and snoring .  3. Routine follow-up efficacy testing should be performed.  Report prepared by: Signature: Fransico Him. MD   Electronically Sig

## 2019-04-29 NOTE — Procedures (Signed)
Erroneous encounter

## 2019-04-30 ENCOUNTER — Ambulatory Visit: Payer: PRIVATE HEALTH INSURANCE

## 2019-04-30 ENCOUNTER — Telehealth: Payer: PRIVATE HEALTH INSURANCE

## 2019-04-30 DIAGNOSIS — K591 Functional diarrhea: Secondary | ICD-10-CM

## 2019-04-30 NOTE — Progress Notes
FAMILY MEDICINE VIDEO CONFERENCE  VISIT    Subjective:   PCP: Wynonia Musty., MD    Kerry Baxter is a 63 y.o. female here for same-day visit for Maili for diarrhea   Onset 2 weeks has continued until 3 d ago  No additional contributory past medical, surgical, family or social history except as documented in care connect.     ROS: Full 14 system ROS was completed and negative unless stated  elsewhere    Past Medical History/Problems:  Past Medical History:   Diagnosis Date    Hyperlipidemia 11/28/2012    Insomnia        Medications:  Current Outpatient Medications   Medication Sig    Melatonin 3 MG CAPS Take by mouth.     No current facility-administered medications for this visit.        Allergies:  Allergies   Allergen Reactions    Garlic     Sulfa Antibiotics      Rash          Objective:     There were no vitals filed for this visit.  Gen: WNWD, in NAD  HEENT: PERRL, sclerae anicteric, nares normal, OP clear, TMs clear  NECK: supple, no LAD  CVS: RRR  RESP: CTAB  ABD: +BS, soft, NT, ND  EXTR: no edema   SKIN: no rashes.     Assessment & Plan:     Kerry Baxter is a 63 y.o. female here for same-day visit.  Diagnoses and all orders for this visit:    Functional diarrhea    Diarrhea  Imodium start with double dose followed by single dose after each loose BM   until diarrhea stops  If diarrhea starts up again and more than 8 hrs start with double dose    Liquid diet until diarrhea stops  Bananas, Rice, Apples are firming for stool    Return to clinic if symptoms worsen or persist.          I have independently reviewed and interpreted the above.   The records are uploaded or scanned into care connect in the patients personal file.      Time:20  min was spent with this patient including over 50% FTF   The above plan of care, diagnosis, orders, and follow-up were discussed with the patient.  Questions related to this recommended plan of care were answered.

## 2019-05-01 ENCOUNTER — Telehealth: Payer: Self-pay | Admitting: *Deleted

## 2019-05-01 NOTE — Telephone Encounter (Signed)
Informed patient of sleep study results and patient understanding was verbalized. Patient understands her sleep study showed they have sleep apnea and recommend auto CPAP from 4 to 18cm H2O through [facility]. Followup with me in 10 weeks. Upon patient request DME selection is [facility]. Patient understands she will be contacted by [facility] to set up  Her cpap. Patient understands to call if [facility] does not contact her with new setup in a timely manner. Patient understands they will be called once confirmation has been received from [facility] that they have received their new machine to schedule 10 week follow up appointment.  [facility] notified of new cpap order  Please add to airview Patient was grateful for the call and thanked me.

## 2019-05-01 NOTE — Telephone Encounter (Signed)
-----   Message from Sueanne Margarita, MD sent at 04/29/2019  5:04 PM EDT ----- Please let patient know that they have sleep apnea and recommend auto CPAP  from 4 to 18cm H2O through [facility].  Order placed in [place].  Followup with me in 10 weeks

## 2019-05-04 ENCOUNTER — Telehealth (HOSPITAL_COMMUNITY): Payer: Self-pay

## 2019-05-04 ENCOUNTER — Other Ambulatory Visit: Payer: Self-pay | Admitting: Cardiovascular Disease

## 2019-05-04 NOTE — Telephone Encounter (Signed)
Sleep study referral form and ICD 10 code faxed to [facility]. Confirmation of received.

## 2019-05-05 MED FILL — CORLANOR 7.5 MG TABLET: 7.5 | 30 days supply | Qty: 60 | Fill #2

## 2019-05-07 ENCOUNTER — Other Ambulatory Visit: Payer: Self-pay

## 2019-05-09 ENCOUNTER — Telehealth (HOSPITAL_COMMUNITY): Payer: Self-pay | Admitting: Licensed Clinical Social Worker

## 2019-05-09 NOTE — Telephone Encounter (Signed)
Pt called CSW due to current concerns with inability to pay for her Xarelto as she is in the donut hole.  CSW completed chart review and saw that we were trying to get pt enrolled in [place] patient assistance but she had not spent enough on copays for the year- CSW will look into pt current copay balance and see if pt now qualifies- Pt would like samples to hold her over while we investigate this- CSW sent request to clinic RN to see if we have samples at this time  Pt also inquired if her PAN foundation had run out for [employer]- CSW verified that it had and applied for pt to be put on the waitlist.  CSW sent patient [employer] application for [place] as there will likely now be a high cost for this medication until pt is out of the donut hole.  Pt to complete and bring to clinic when she picks up her samples.  CSW will continue to follow and assist as needed  Jorge Ny, [place] [facility] Desk#: [phone] Cell#: [phone]

## 2019-05-10 ENCOUNTER — Other Ambulatory Visit (HOSPITAL_COMMUNITY): Payer: Self-pay

## 2019-05-10 MED ORDER — RIVAROXABAN 20 MG PO TABS: 20.0000 mg | ORAL_TABLET | Freq: Every day | ORAL | 6 refills | Status: DC

## 2019-05-11 ENCOUNTER — Telehealth (HOSPITAL_COMMUNITY): Payer: Self-pay | Admitting: Licensed Clinical Social Worker

## 2019-05-11 NOTE — Telephone Encounter (Signed)
CSW called pt to check in regarding Xarelto concerns.  CSW confirmed that clinic doesn't have any samples right now so patient is going to pay for $30 day supply to hold her over while we work on getting her patient assistance.  CSW called [facility] who confirms pt has spent $826 on copays for the year- pt will now qualify for [place] assistance as they required pt spend over $640.  CSW called pt to inform and told her she needs to pick up copay expense report from [facility] when she goes- she will plan for her daughter to fax it to [place]- fax number provided   CSW will continue to follow and assist as needed  Jorge Ny, [place] Desk#: [phone] Cell#: [phone]

## 2019-05-21 MED FILL — ENTRESTO 97 MG-103 MG TAB: 97-103 | 30 days supply | Qty: 60 | Fill #3

## 2019-05-28 ENCOUNTER — Other Ambulatory Visit (HOSPITAL_COMMUNITY): Payer: Self-pay | Admitting: *Deleted

## 2019-05-28 MED ORDER — SPIRONOLACTONE 25 MG PO TABS: 25.0000 mg | ORAL_TABLET | Freq: Every day | ORAL | 3 refills | Status: DC

## 2019-05-28 MED ORDER — DIGOXIN 125 MCG PO TABS: 0.1250 mg | ORAL_TABLET | Freq: Every day | ORAL | 3 refills | Status: DC

## 2019-05-29 ENCOUNTER — Other Ambulatory Visit (HOSPITAL_COMMUNITY): Payer: Self-pay

## 2019-05-29 MED ORDER — FUROSEMIDE 40 MG PO TABS: 40.0000 mg | ORAL_TABLET | ORAL | 3 refills | Status: DC

## 2019-05-29 MED ORDER — SACUBITRIL-VALSARTAN 97-103 MG PO TABS: 1.0000 | ORAL_TABLET | Freq: Two times a day (BID) | ORAL | 3 refills | Status: DC

## 2019-05-29 MED ORDER — CARVEDILOL 6.25 MG PO TABS: 6.2500 mg | ORAL_TABLET | Freq: Two times a day (BID) | ORAL | 11 refills | Status: DC

## 2019-05-30 ENCOUNTER — Other Ambulatory Visit (HOSPITAL_COMMUNITY): Payer: Self-pay

## 2019-05-30 MED ORDER — DIGOXIN 125 MCG PO TABS: 0.1250 mg | ORAL_TABLET | Freq: Every day | ORAL | 3 refills | Status: DC

## 2019-05-30 MED ORDER — IVABRADINE HCL 7.5 MG PO TABS: ORAL_TABLET | ORAL | 6 refills | Status: DC

## 2019-05-30 MED ORDER — SPIRONOLACTONE 25 MG PO TABS: 25.0000 mg | ORAL_TABLET | Freq: Every day | ORAL | 3 refills | Status: DC

## 2019-06-01 DIAGNOSIS — I10 Essential (primary) hypertension: Secondary | ICD-10-CM | POA: Diagnosis not present

## 2019-06-01 DIAGNOSIS — I82409 Acute embolism and thrombosis of unspecified deep veins of unspecified lower extremity: Secondary | ICD-10-CM | POA: Diagnosis not present

## 2019-06-01 DIAGNOSIS — I2699 Other pulmonary embolism without acute cor pulmonale: Secondary | ICD-10-CM | POA: Diagnosis not present

## 2019-06-01 DIAGNOSIS — I509 Heart failure, unspecified: Secondary | ICD-10-CM | POA: Diagnosis not present

## 2019-06-01 DIAGNOSIS — Z299 Encounter for prophylactic measures, unspecified: Secondary | ICD-10-CM | POA: Diagnosis not present

## 2019-06-01 DIAGNOSIS — Z6834 Body mass index (BMI) 34.0-34.9, adult: Secondary | ICD-10-CM | POA: Diagnosis not present

## 2019-06-08 ENCOUNTER — Telehealth (HOSPITAL_COMMUNITY): Payer: Self-pay | Admitting: Pharmacy Technician

## 2019-06-08 NOTE — Telephone Encounter (Signed)
Received denial of Tier exception for Entresto from [place]. Called [employer] to see if patient was out of the donut hole. Patient is in what they consider a Catastrophic or Stage 4 Tier. Meaning she should be paying $3.60 for Generics and $8.95 or 5% for Brand name medications. The representative informed me that Delene Loll would likely cost her $85.82 for a 30-Day supply. Patient filled a month supply of Entresto from [facility] and then a 90-Day supply from [facility] mail order pharmacy. Called patient to talk with her about patient assistance moving forward. She stated she got busy and forgot to send in the paperwork for Xarelto and Entresto.   I explained to her now that she has ordered a 90 day and 30 day supply of her Delene Loll that [employer] would only approve her for one month more than likely. It would have been financially beneficial to her to have sent in the documentation instead of getting the 90 day supply through [facility]. She seems to have a better understanding about the application process now and understands that we need her to fill out her portion of the [employer] application and to send in the expense report from [facility] to [employer] to receive Xarelto.  She did not have a completed prescriber portion on her [employer] application. She is supposed to call me when she faxes in her patient portion and I will fax the prescriber portion and copy of insurance card.  Charlann Boxer, CPhT

## 2019-06-11 NOTE — Telephone Encounter (Signed)
Patient stated that she sent in her portion of information to [employer] and [employer]. I sent off the provider portion to [employer] this morning.  Charlann Boxer, CPhT

## 2019-06-13 NOTE — Telephone Encounter (Signed)
Patient was approved to receive Entresto through [employer].  ID: [id number]  Effective Dates: 06/13/2019 through 10/18/2019.  Phone Number: [phone]  Called and gave patient the good news and the phone number to [employer]. Still waiting to hear about her Xarelto.  Charlann Boxer, CPhT

## 2019-06-15 ENCOUNTER — Other Ambulatory Visit (HOSPITAL_COMMUNITY): Payer: Self-pay

## 2019-06-15 MED ORDER — SACUBITRIL-VALSARTAN 97-103 MG PO TABS: 1.0000 | ORAL_TABLET | Freq: Two times a day (BID) | ORAL | 3 refills | Status: DC

## 2019-06-20 ENCOUNTER — Ambulatory Visit: Payer: PRIVATE HEALTH INSURANCE

## 2019-06-20 DIAGNOSIS — E782 Mixed hyperlipidemia: Secondary | ICD-10-CM

## 2019-06-20 NOTE — Patient Instructions
Avoid napping during the day; it can disturb the normal pattern of sleep and wakefulness.   Avoid stimulants such as caffeine, nicotine, and alcohol too close to bedtime. While alcohol is well known to speed the onset of sleep, it disrupts sleep in the second half as the body begins to metabolize the alcohol, causing arousal.   Exercise can promote good sleep. Vigorous exercise should be taken in the morning or late afternoon. A relaxing exercise, like yoga, can be done before bed to help initiate a restful night's sleep.   Food can be disruptive right before sleep; stay away from large meals close to bedtime. Also dietary changes can cause sleep problems, if someone is struggling with a sleep problem, it's not a good time to start experimenting with spicy dishes. And, remember, chocolate has caffeine.   Ensure adequate exposure to natural light. This is particularly important for older people who may not venture outside as frequently as children and adults. Light exposure helps maintain a healthy sleep-wake cycle.   Establish a regular relaxing bedtime routine. Try to avoid emotionally upsetting conversations and activities before trying to go to sleep. Don't dwell on, or bring your problems to bed.   Associate your bed with sleep. It's not a good idea to use your bed to watch TV, listen to the radio, or read.   Make sure that the sleep environment is pleasant and relaxing. The bed should be comfortable, the room should not be too hot or cold, or too bright.

## 2019-06-20 NOTE — Progress Notes
Patient: Kerry Baxter  DOB: 12-Aug-1956  MRN: [id number]  Date: 06/20/2019       Chief Complaint:   Chief Complaint   Patient presents with   ??? Follow-up     per patient here for second shingles shot.       Subjective   Preventative Health Care    INFLUENZA VACC    Shingrix #2    FIT TEST    No additional contributory past medical, surgical, family or social history except as documented in care connect.   Complete 14 pointReview of Systems - ROS negative except as documented above     Past Medical History:   Diagnosis Date   ??? Hyperlipidemia 11/28/2012   ??? Insomnia      Patient Active Problem List   Diagnosis   ??? Hyperlipidemia   ??? Insomnia   ??? Routine general medical examination at a health care facility   ??? Preventative health care   ??? SK (seborrheic keratosis)   ??? End of life care       Meds:  Outpatient Medications Prior to Visit   Medication Sig   ??? fluticasone (FLONASE ALLERGY RELIEF) 50 mcg/act nasal spray    ??? Melatonin 3 MG CAPS Take by mouth.     No facility-administered medications prior to visit.        Allergies   Allergen Reactions   ??? Garlic    ??? Sulfa Antibiotics      Rash              Objective     Vitals: BP 109/72  ~ Pulse 68  ~ Temp 36.7 ???C (98 ???F) (Tympanic)  ~ Resp 16  ~ Ht 5' 5'' (1.651 m)  ~ Wt 148 lb 3.2 oz (67.2 kg)  ~ SpO2 97%  ~ BMI 24.66 kg/m???   Head: atraumatic  EENT:TM's visible and  No buccal gingival lesions pharynx nl  ]  Pulses: equal and symmetrical  Skin: Clear of significant lesions  Neuro:           Labs/Studies:      Assessment /Plan     Diagnoses and all orders for this visit:    Preventative health care  -     Influenza vaccine IM;   PF  -     Zoster Acc Recombinant Adjuvanted (Shingrix)  -     Fecal Occult Blood Immunoassay; Future    Mixed hyperlipidemia   Continue all meds      Follow up  Prn ]  Lorin Picket F. Lajean Silvius, MD          I have independently reviewed and interpreted the above.   The records are uploaded or scanned into care connect in the patients personal file. Time: 20  min was spent with this patient including over 50% Face to face time  discussing medical conditions as above, counseling on nature of conditions and care    The above plan of care, diagnosis, orders, and follow-up were discussed with the patient.  Questions related to this recommended plan of care were answered.

## 2019-06-21 ENCOUNTER — Encounter (HOSPITAL_COMMUNITY): Payer: Self-pay

## 2019-06-21 ENCOUNTER — Other Ambulatory Visit (HOSPITAL_COMMUNITY): Payer: Self-pay

## 2019-06-21 NOTE — Progress Notes (Signed)
Placed the orders for the pre cpx covid test. Pt scheduled for 9/17 at 1035. Pt aware and agreeable to restrictions following test.

## 2019-06-22 NOTE — Telephone Encounter (Signed)
Spoke with [employer] regarding the status of her Xarelto patient assistance application. They stated she needs to spend 4% OOP ($640.00) in order to qualify.  Called patient to make her aware of this. She stated that she had already sent that in but would refax it since they did not get it. Provided patient with fax number again and will call Tuesday to confirm that they receive it.  Phone: [phone]  Fax: [place], CPhT

## 2019-06-25 NOTE — Progress Notes (Signed)
Advanced Heart Failure Clinic Note  Date:  06/25/2019   ID:  [name], DOB November 21, 1955, MRN [id number]  Location: Home  Provider location: [facility] Type of Visit: Established patient   PCP:  Monico Blitz, MD  Cardiologist:  Kate Sable, MD Primary HF: Dr Haroldine Laws  EP: Dr Curt Bears  Chief Complaint: Heart Failure    History of Present Illness:  Jennifer [name] is a 63 y.o. female with a history of systolic HF due to mixed ICM/NICM, DVT, PE, fibromyalgia.   Long h/o reported mild NICM EF 40-45%. Echo 11/19 EF 15-20%  Catheterization done12/05/19revealed 90% proximal LAD and a 50% circumflex stenosis. She underwent bypass grafting x2 with an L IMA to the LAD and an SVG to the OM on 09/22/18 with Dr Roxan Hockey.  Post CABG EF remained depressed.   -cMRI 2/20 LVEF 21% Moderately reduced RV, moderateTR. No LGE - ECHO 02/2019 EF 20-25% RV severerly reduced   Zio Patch 5/20 7.3 % PVCs 1. Predominant underlying rhythm was Sinus Rhythm. Minimum HR of 56 bpm, max HR of 176 bpm, and avg HR of 77 bpm. 2. Three runs of NSVT - longest 10 beats 3. Seven runs of SVT - longest lasting 9 beats   Started carvedilol 03/13/2019 .  Seen by EP for consideration of ICD but suggested starting a b-blocker first to suppress PVCs  Here for routine f/u. Feels great. No CP. Mild SOB when walking up from her barn (200-300 yards up hill). No edema, orthopnea or PND. Taking all meds without problem. No dizziness.   Echo today EF 20-25%. RV ok. Personally reviewed   Cardiac studies:  Echo  02/19/2019 EF 20-25% RV severely reduced  11/28/2018 EF 10-15% with moderate RV dysfunction.   cMRI 12/05/18 1. Moderately dilated LV with EF 21%, diffuse hypokinesis. 2. Normal RV size with moderately decreased systolic function, EF 123456. 3. Moderate TR, at least moderate central MR (likely functional). 4. No myocardial LGE, so no evidence for prior infarct, infiltrative disease,  or myocarditis.  RHC 12/05/18 On milrinone 0.125 RA = 8 RV = 37/10 PA = 39/15 (25) PCW = 19 (v = 35) Fick cardiac output/index = 5.0/2.8 PVR = 1.1 WU Ao sat = 94% PA sat = 66%, 66% PaPi = 3.0 RA/PCW = 0.42  Past Medical History:  Diagnosis Date  . Arthritis    "probably; maybe in my hands" (09/21/2018)  . Congestive heart failure (CHF) (Jennifer [name])   . Dyspnea   . Fibromyalgia   . GERD (gastroesophageal reflux disease)   . Hypercholesteremia   . Hypertension   . IBS (irritable bowel syndrome)   . Mild sleep apnea    no CPAP (09/21/2018)  . Myocardial infarction [facility])    "was told I have had one; never knew about it" (09/21/2018)  . Nonischemic cardiomyopathy (HCC)    ECHO EF 40-45%, Septal Thickness,and mild global left ventricular dysfunction; stress test 03/2004 +VE, refersible defect; ECHO 03/2005 @[facility]  EF 55%  . Pulmonary embolus [facility])    Past Surgical History:  Procedure Laterality Date  . ANKLE FRACTURE SURGERY Left 2000  . BUNIONECTOMY Right 1990s?   "& removed part of my great toe due to fungus under nail"  . CARDIAC CATHETERIZATION  03/2005  . CORONARY ARTERY BYPASS GRAFT N/A 09/22/2018   Procedure: CORONARY ARTERY BYPASS GRAFTING (CABG), ON PUMP, TIMES TWO, USING LEFT INTERNAL MAMMARY ARTERY AND ENDOSCOPICALLY HARVESTED RIGHT GREATER SAPHENOUS VEIN;  Surgeon: Melrose Nakayama, MD;  Location: [facility] OR;  Service: Open Heart Surgery;  Laterality: N/A;  . DILATION AND CURETTAGE OF UTERUS    . FRACTURE SURGERY    . RIGHT HEART CATH N/A 12/05/2018   Procedure: RIGHT HEART CATH;  Surgeon: Jolaine Artist, MD;  Location: [place] CV LAB;  Service: Cardiovascular;  Laterality: N/A;  . RIGHT/LEFT HEART CATH AND CORONARY ANGIOGRAPHY N/A 09/21/2018   Procedure: RIGHT/LEFT HEART CATH AND CORONARY ANGIOGRAPHY;  Surgeon: Troy Sine, MD;  Location: [place] [facility];  Service: Cardiovascular;  Laterality: N/A;  . TEE WITHOUT CARDIOVERSION N/A 09/22/2018   Procedure:  TRANSESOPHAGEAL ECHOCARDIOGRAM (TEE);  Surgeon: Melrose Nakayama, MD;  Location: [place];  Service: Open Heart Surgery;  Laterality: N/A;  . TUBAL LIGATION       Current Outpatient Medications  Medication Sig Dispense Refill  . aspirin EC 81 MG EC tablet Take 1 tablet (81 mg total) by mouth every evening. 30 tablet 0  . atorvastatin (LIPITOR) 80 MG tablet Take 1 tablet (80 mg total) by mouth daily at 6 PM. 30 tablet 1  . carvedilol (COREG) 6.25 MG tablet Take 1 tablet (6.25 mg total) by mouth 2 (two) times daily. 60 tablet 11  . cholecalciferol (VITAMIN D3) 25 MCG (1000 UT) tablet Take 1,000 Units by mouth daily.    . digoxin (LANOXIN) 0.125 MG tablet Take 1 tablet (0.125 mg total) by mouth daily. 90 tablet 3  . DULoxetine (CYMBALTA) 60 MG capsule Take 60 mg by mouth 2 (two) times daily.    . furosemide (LASIX) 40 MG tablet Take 1 tablet (40 mg total) by mouth every other day. May take 1 additional tab as needed for weight gain. 120 tablet 3  . gabapentin (NEURONTIN) 300 MG capsule Take 600 mg by mouth 3 (three) times daily.    . ivabradine (CORLANOR) 7.5 MG TABS tablet TAKE 1 TABLET (7.5 MG TOTAL) BY MOUTH 2 (TWO) TIMES DAILY WITH A MEAL. 60 tablet 6  . pramipexole (MIRAPEX) 0.5 MG tablet Take 0.5 mg by mouth at bedtime.     . rivaroxaban (XARELTO) 20 MG TABS tablet Take 1 tablet (20 mg total) by mouth daily with breakfast. 30 tablet 6  . sacubitril-valsartan (ENTRESTO) 97-103 MG Take 1 tablet by mouth 2 (two) times daily. 180 tablet 3  . spironolactone (ALDACTONE) 25 MG tablet Take 1 tablet (25 mg total) by mouth daily. 90 tablet 3  . vitamin B-12 (CYANOCOBALAMIN) 500 MCG tablet Take 1 tablet (500 mcg total) by mouth daily. 30 tablet 0  . vitamin C (ASCORBIC ACID) 500 MG tablet Take 500 mg by mouth daily.     No current facility-administered medications for this encounter.     Allergies:   Poison oak extract   Social History:  The patient  reports that she quit smoking about 28 years  ago. Her smoking use included cigarettes. She has a 30.00 pack-year smoking history. She has never used smokeless tobacco. She reports previous alcohol use. She reports that she does not use drugs.   Family History:  The patient's family history includes Alcohol abuse in her father; Colon cancer in her father; Congestive Heart Failure (age of onset: 21) in her brother; Depression in her father; Diabetes Mellitus II in her mother; Heart disease (age of onset: 68) in her mother.   ROS:  Please see the history of present illness.   All other systems are personally reviewed and negative.  Vitals:   06/26/19 1347  BP: 121/63  Pulse: 84  SpO2: 97%    Exam:   General:  Well appearing. No resp difficulty HEENT: normal Neck: supple. no JVD. Carotids 2+ bilat; no bruits. No lymphadenopathy or thryomegaly appreciated. Cor: PMI nondisplaced. Regular rate & rhythm. No rubs, gallops or murmurs. Lungs: clear Abdomen: soft, nontender, nondistended. No hepatosplenomegaly. No bruits or masses. Good bowel sounds. Extremities: no cyanosis, clubbing, rash, edema Neuro: alert & orientedx3, cranial nerves grossly intact. moves all 4 extremities w/o difficulty. Affect pleasant     Recent Labs: 11/27/2018: ALT 71 12/07/2018: TSH 2.343 02/26/2019: B Natriuretic Peptide 351.3; Hemoglobin 14.5; Platelets 242 04/11/2019: BUN 22; Creatinine, Ser 0.98; Magnesium 2.2; Potassium 4.3; Sodium 139  Personally reviewed   Wt Readings from Last 3 Encounters:  04/11/19 85.4 kg (188 lb 3.2 oz)  03/27/19 85.3 kg (188 lb)  02/26/19 85.5 kg (188 lb 6.4 oz)    ECG: NSR 71 no pvcs Personally reviewed   ASSESSMENT AND PLAN:  1. Biventricular Chronic Systolic HF -  Suspect mixed ICM/NICM (?PVCs)  - LHC 2019 90% LAD and 50% circumflex --> S/P CABG x2 LIMA to LAD and SVG to OM   - ECHO 11/2018 EF 10-15% moderate RV dysfunction.   - cMRI 11/2018  LVEF 21% Moderately reduced RV, moderateTR moderate to severe MR. No LGE  - ECHO  02/2019 EF 20-25% RV severerly reduced -  Established with Dr Lavonna Monarch to to hold off on ICD until we try to suppress PVCs with carvedilol.  -  Zio Patch 5/20 with 7.3% PVCs.  - Echo today EF 20-25% RV ok severe MR - NYHA II. Volume status ok  -Continue coreg 6.25 mg twice a day.   -Continue digoxin 0.125 mg daily.  -On goal dose entresto 97/103 twice a day  -On corlanor 7.5 mg twice a day.  -On spiro 25 daily  - Proceed with CPX - I think she has mixed ICM/NICM that is probably not related to PVCs. Will continue aggressive medical therapy. Repeat Zio to reassess PVC burden. Increase carvedilol to 9.375 bid and add Farxiga 10mg  daily - Refer back to EP for ICD  2. H/O bilateral PE - Continue xarelto - No bleeding   3. CAD S/P CABG 09/2018 - LIMA to the LAD and an SVG to the OM on 12/6/19with Dr Roxan Hockey - No s/s of ischemia. -Continue statin and xarelto.  4. Fibromyalgia - stable  5. PVCs - Zio Patch 5/20 with 7.3% PVCs.  - Increase carvedilol  - Repeat ZioPatch to requantify PVCS  6. Snoring - Home sleep study 7/20. AHI 7.2 with desat down to 85%    Glori Bickers, MD  06/25/2019 6:40 PM  [place] [address] [address] [phone] (office) [phone] (fax)

## 2019-06-26 ENCOUNTER — Ambulatory Visit (HOSPITAL_COMMUNITY)
Admission: RE | Admit: 2019-06-26 | Discharge: 2019-06-26 | Disposition: A | Discharge status: Home / Self Care | Payer: Medicare PPO | Source: Ambulatory Visit | Attending: Internal Medicine | Admitting: Internal Medicine

## 2019-06-26 ENCOUNTER — Other Ambulatory Visit: Payer: Self-pay

## 2019-06-26 ENCOUNTER — Encounter (HOSPITAL_COMMUNITY): Payer: Self-pay | Admitting: Internal Medicine

## 2019-06-26 ENCOUNTER — Ambulatory Visit (HOSPITAL_BASED_OUTPATIENT_CLINIC_OR_DEPARTMENT_OTHER)
Admission: RE | Admit: 2019-06-26 | Discharge: 2019-06-26 | Disposition: A | Discharge status: Home / Self Care | Payer: Medicare PPO | Source: Ambulatory Visit | Attending: Internal Medicine | Admitting: Internal Medicine

## 2019-06-26 VITALS — BP 121/63 | HR 84 | Wt 187.4 lb

## 2019-06-26 DIAGNOSIS — Z7982 Long term (current) use of aspirin: Secondary | ICD-10-CM | POA: Insufficient documentation

## 2019-06-26 DIAGNOSIS — Z79899 Other long term (current) drug therapy: Secondary | ICD-10-CM | POA: Diagnosis not present

## 2019-06-26 DIAGNOSIS — Z7901 Long term (current) use of anticoagulants: Secondary | ICD-10-CM | POA: Insufficient documentation

## 2019-06-26 DIAGNOSIS — I2510 Atherosclerotic heart disease of native coronary artery without angina pectoris: Secondary | ICD-10-CM

## 2019-06-26 DIAGNOSIS — Z87891 Personal history of nicotine dependence: Secondary | ICD-10-CM | POA: Diagnosis not present

## 2019-06-26 DIAGNOSIS — M1990 Unspecified osteoarthritis, unspecified site: Secondary | ICD-10-CM | POA: Diagnosis not present

## 2019-06-26 DIAGNOSIS — I5022 Chronic systolic (congestive) heart failure: Secondary | ICD-10-CM | POA: Diagnosis not present

## 2019-06-26 DIAGNOSIS — Z951 Presence of aortocoronary bypass graft: Secondary | ICD-10-CM | POA: Diagnosis not present

## 2019-06-26 DIAGNOSIS — K589 Irritable bowel syndrome without diarrhea: Secondary | ICD-10-CM | POA: Diagnosis not present

## 2019-06-26 DIAGNOSIS — K219 Gastro-esophageal reflux disease without esophagitis: Secondary | ICD-10-CM | POA: Insufficient documentation

## 2019-06-26 DIAGNOSIS — I509 Heart failure, unspecified: Secondary | ICD-10-CM | POA: Diagnosis not present

## 2019-06-26 DIAGNOSIS — I493 Ventricular premature depolarization: Secondary | ICD-10-CM

## 2019-06-26 DIAGNOSIS — Z86711 Personal history of pulmonary embolism: Secondary | ICD-10-CM | POA: Diagnosis not present

## 2019-06-26 DIAGNOSIS — I110 Hypertensive heart disease with heart failure: Secondary | ICD-10-CM | POA: Diagnosis not present

## 2019-06-26 DIAGNOSIS — M797 Fibromyalgia: Secondary | ICD-10-CM | POA: Diagnosis not present

## 2019-06-26 DIAGNOSIS — I255 Ischemic cardiomyopathy: Secondary | ICD-10-CM

## 2019-06-26 DIAGNOSIS — E7800 Pure hypercholesterolemia, unspecified: Secondary | ICD-10-CM | POA: Diagnosis not present

## 2019-06-26 DIAGNOSIS — Z86718 Personal history of other venous thrombosis and embolism: Secondary | ICD-10-CM | POA: Diagnosis not present

## 2019-06-26 DIAGNOSIS — Z8249 Family history of ischemic heart disease and other diseases of the circulatory system: Secondary | ICD-10-CM | POA: Diagnosis not present

## 2019-06-26 DIAGNOSIS — I252 Old myocardial infarction: Secondary | ICD-10-CM | POA: Insufficient documentation

## 2019-06-26 MED ORDER — CARVEDILOL 6.25 MG PO TABS: 9.3750 mg | ORAL_TABLET | Freq: Two times a day (BID) | ORAL | 11 refills | Status: DC

## 2019-06-26 MED ORDER — FARXIGA 10 MG PO TABS: 10.0000 mg | ORAL_TABLET | Freq: Every day | ORAL | 6 refills | Status: DC

## 2019-06-26 NOTE — Progress Notes (Signed)
Zio patch placed onto patient.  All instructions and information reviewed with patient, they verbalize understanding with no questions. 

## 2019-06-26 NOTE — Progress Notes (Signed)
  Echocardiogram 2D Echocardiogram has been performed.  Jennifer [name] 06/26/2019, 1:37 PM

## 2019-06-26 NOTE — Patient Instructions (Signed)
Increase Carvedilol to 9.375 mg (1 & 1/2 tabs) Twice daily   Start Farxiga 10 mg daily  Your provider has recommended that  you wear a Zio Patch for 7 days.  This monitor will record your heart rhythm for our review.  IF you have any symptoms while wearing the monitor please press the button.  If you have any issues with the patch or you notice a red or orange light on it please call the company at [phone].  Once you remove the patch please mail it back to the company as soon as possible so we can get the results.  You have been referred to Dr Curt Bears at [facility], his office will call you for an appointment.  Your physician recommends that you schedule a follow-up appointment in: 2 months

## 2019-06-28 NOTE — Telephone Encounter (Signed)
Called [employer] to check the status of patients application. They said they have yet to receive her OOP expense report from the pharmacy. Called and spoke with patient who told me that she did send it in one other time. Gave her my fax number in the clinic and she is going to fax it to me at the office and I will fax it for her to [employer].  Will continue to follow.  Charlann Boxer, CPhT

## 2019-07-02 NOTE — Telephone Encounter (Signed)
Patient sent a picture of her OOP expense report. Sent in to [employer] via fax.  Will continue to follow up.  Charlann Boxer, CPhT

## 2019-07-05 ENCOUNTER — Other Ambulatory Visit (HOSPITAL_COMMUNITY)
Admission: RE | Admit: 2019-07-05 | Discharge: 2019-07-05 | Disposition: A | Discharge status: Home / Self Care | Payer: Medicare PPO | Source: Ambulatory Visit | Attending: Internal Medicine | Admitting: Internal Medicine

## 2019-07-05 DIAGNOSIS — Z01812 Encounter for preprocedural laboratory examination: Secondary | ICD-10-CM | POA: Insufficient documentation

## 2019-07-05 DIAGNOSIS — Z20828 Contact with and (suspected) exposure to other viral communicable diseases: Secondary | ICD-10-CM | POA: Insufficient documentation

## 2019-07-05 NOTE — Telephone Encounter (Signed)
[employer] states that they have yet to receive my fax. Representative gave me a secure email address to send the OOP expense report to. [email or web address] with the suject line [[email or web address]} patients name, DOB and the report.   Will follow up.  Charlann Boxer, CPhT

## 2019-07-06 LAB — NOVEL CORONAVIRUS, NAA (HOSP ORDER, SEND-OUT TO REF LAB; TAT 18-24 HRS): SARS-CoV-2, NAA: NOT DETECTED

## 2019-07-09 ENCOUNTER — Ambulatory Visit (HOSPITAL_COMMUNITY): Payer: Medicare PPO | Attending: Orthopaedic Surgery

## 2019-07-09 ENCOUNTER — Other Ambulatory Visit: Payer: Self-pay

## 2019-07-09 ENCOUNTER — Other Ambulatory Visit (HOSPITAL_COMMUNITY): Payer: Self-pay | Admitting: *Deleted

## 2019-07-09 DIAGNOSIS — I5043 Acute on chronic combined systolic (congestive) and diastolic (congestive) heart failure: Secondary | ICD-10-CM | POA: Insufficient documentation

## 2019-07-09 DIAGNOSIS — I491 Atrial premature depolarization: Secondary | ICD-10-CM | POA: Diagnosis not present

## 2019-07-09 NOTE — Telephone Encounter (Signed)
Called [employer] to check the status of her application. They advised me to call back in 48 hours. Will check back on Wednesday.  Charlann Boxer, CPhT

## 2019-07-10 ENCOUNTER — Telehealth (HOSPITAL_COMMUNITY): Payer: Self-pay

## 2019-07-10 ENCOUNTER — Ambulatory Visit: Payer: Medicare PPO | Admitting: Cardiology

## 2019-07-10 NOTE — Telephone Encounter (Signed)
-----   Message from Conrad Escanaba, NP sent at 07/10/2019 11:49 AM EDT ----- No clear  heart failure limitation noted. Low functional capacity. Please call.

## 2019-07-10 NOTE — Telephone Encounter (Signed)
Called pt to review cpx test results. No answer. Left voicemail for call back.

## 2019-07-12 ENCOUNTER — Telehealth (HOSPITAL_COMMUNITY): Payer: Self-pay

## 2019-07-12 NOTE — Telephone Encounter (Signed)
-----   Message from Conrad Larimer, NP sent at 07/10/2019 11:49 AM EDT ----- No clear  heart failure limitation noted. Low functional capacity. Please call.

## 2019-07-12 NOTE — Telephone Encounter (Signed)
Reviewed cpx results. Pt happy for the results. Pt states that she feel "great". No further questions at this time.

## 2019-07-13 ENCOUNTER — Other Ambulatory Visit (HOSPITAL_COMMUNITY): Payer: Self-pay

## 2019-07-13 MED ORDER — FARXIGA 10 MG PO TABS: 10.0000 mg | ORAL_TABLET | Freq: Every day | ORAL | 6 refills | Status: DC

## 2019-07-13 NOTE — Telephone Encounter (Signed)
Spoke with [employer], patient needs to spend $61 more dollars to qualify for Xarelto assistance. Spoke with patient who is gong to send me a statement from [facility] mail order pharmacy that shows she has spent more than that in this amount of time.  After she sends that information in, will fax to [employer].  Will continue to follow.  Charlann Boxer, CPhT

## 2019-07-17 ENCOUNTER — Other Ambulatory Visit: Payer: Self-pay

## 2019-07-17 ENCOUNTER — Ambulatory Visit: Payer: Medicare PPO | Admitting: Cardiology

## 2019-07-17 ENCOUNTER — Other Ambulatory Visit (HOSPITAL_COMMUNITY): Payer: Self-pay

## 2019-07-17 ENCOUNTER — Encounter: Payer: Self-pay | Admitting: Cardiology

## 2019-07-17 VITALS — BP 98/64 | HR 46 | Ht 62.0 in | Wt 190.6 lb

## 2019-07-17 DIAGNOSIS — I5022 Chronic systolic (congestive) heart failure: Secondary | ICD-10-CM

## 2019-07-17 DIAGNOSIS — Z01812 Encounter for preprocedural laboratory examination: Secondary | ICD-10-CM

## 2019-07-17 MED ORDER — FARXIGA 10 MG PO TABS: 10.0000 mg | ORAL_TABLET | Freq: Every day | ORAL | 6 refills | Status: DC

## 2019-07-17 NOTE — Patient Instructions (Addendum)
Medication Instructions:  Your physician recommends that you continue on your current medications as directed. Please refer to the Current Medication list given to you today.     * If you need a refill on your cardiac medications before your next appointment, please call your pharmacy. *   Labwork: Pre procedure lab work on today: BMET & CBC w/ diff If you have labs (blood work) drawn today and your tests are completely normal, you will receive your results only by:  Person (if you have MyChart) OR  A paper copy in the mail If you have any lab test that is abnormal or we need to change your treatment, we will call you to review the results.   Testing/Procedures: Your physician has recommended that you have a defibrillator inserted. An implantable cardioverter defibrillator (ICD) is a small device that is placed in your chest or, in rare cases, your abdomen. This device uses electrical pulses or shocks to help control life-threatening, irregular heartbeats that could lead the heart to suddenly stop beating (sudden cardiac arrest). Leads are attached to the ICD that goes into your heart. This is done in the hospital and usually requires an overnight stay. Please follow the instructions below, located under the special instructions section.   Follow-Up: Your physician recommends that you schedule a wound check appointment 10-14 days, after your procedure on 07/25/19, with the device clinic.  Your physician recommends that you schedule a follow up appointment in 91 days, after your procedure on 07/25/19, with Dr. Curt Bears.  * Please note that any paperwork needing to be filled out by the provider will need to be addressed at the front desk prior to seeing the provider.  Please note that any FMLA, disability or other documents regarding health condition is subject to a $25.00 charge that must be received prior to completion of paperwork in the form of a money order or check. *  Thank  you for choosing [facility]!!   Trinidad Curet, RN [phone]   Any Other Special Instructions Will Be Listed Below (If Applicable).   Implantable Device Instructions  You are scheduled for: Defibrillator implant on 07/25/19 with Dr. Curt Bears.  1.   Pre procedure testing-             A.  LAB WORK--- On 07/17/19. You do not need to be fasting.               B. COVID TEST-- On 07/21/19 @ 12:15 pm - You will go to [facility] hospital ([place]) for your Covid testing.   This is a drive thru test site.  There will be multiple testing areas.  Be sure to share with the first checkpoint that you are there for pre-procedure/surgery testing. This will put you into the right (yellow) lane that leads to the PAT testing team.   Stay in your car and the nurse team will come to your car to test you.  After you are tested please go home and self quarantine until the day of your procedure.    2. On the day of your procedure 07/25/19 you will go to [facility] hospital ([address]) at 6:30 am.  Dennis Bast will go to the main entrance [address] [employer]) and enter where the [employer] are.  You will check in at ADMITTING.  You may have one support person come in to the hospital with you.  They will be asked to wait in the  waiting room.   3.   Do not eat or drink after midnight prior to your procedure.   4.   On the morning of your procedure do NOT take any medication.  5.  The night before your procedure and the morning of your procedure scrub your neck/chest with surgical scrub.  An instruction letter is included below.   5.  Plan for an overnight stay.  If you use your phone frequently bring your phone charger.  When you are discharged you will need someone to drive you home.   6.  You will follow up with the [facility] 10-14 days after your procedure. You will follow up with Dr. Curt Bears 91 days after your procedure.  These appointments will be made for you.    * If you have ANY questions after you get home, please call the office [phone] and ask for Keashia Haskins RN or send a MyChart message.   [facility] - Preparing For Surgery  Before surgery, you can play an important role. Because skin is not sterile, your skin needs to be as free of germs as possible. You can reduce the number of germs on your skin by washing with CHG (chlorahexidine gluconate) Soap before surgery.  CHG is an antiseptic cleaner which kills germs and bonds with the skin to continue killing germs even after washing.   Please do not use if you have an allergy to CHG or antibacterial soaps.  If your skin becomes reddened/irritated stop using the CHG.   Do not shave (including legs and underarms) for at least 48 hours prior to first CHG shower.  It is OK to shave your face.  Please follow these instructions carefully:  1.  Shower the night before surgery and the morning of surgery with CHG.  2.  If you choose to wash your hair, wash your hair first as usual with your normal shampoo.  3.  After you shampoo, rinse your hair and body thoroughly to remove the shampoo.  4.  Use CHG as you would any other liquid soap.  You can apply CHG directly to the skin and wash gently with a clean washcloth. 5.  Apply the CHG Soap to your body ONLY FROM THE NECK DOWN.  Do not use on open wounds or open sores.  Avoid contact with your eyes, ears, mouth and genitals (private parts).  Wash genitals (private parts) with your normal soap.  6.  Wash thoroughly, paying special attention to the area where your surgery will be performed.  7.  Thoroughly rinse your body with warm water from the neck down.   8.  DO NOT shower/wash with your normal soap after using and rinsing off the CHG soap.  9.  Pat yourself dry with a clean towel.           10.  Wear clean pajamas.           11.  Place clean sheets on your bed the night of your first shower and do not sleep with pets.  Day of Surgery: Do not apply any  deodorants/lotions.  Please wear clean clothes to the hospital/surgery center.      Cardioverter Defibrillator Implantation An implantable cardioverter defibrillator (ICD) is a small, lightweight, battery-powered device that is placed (implanted) under the skin in the chest or abdomen. Your caregiver may prescribe an ICD if:  You have had an irregular heart rhythm (arrhythmia) that originated in the lower chambers of the heart (ventricles).  Your heart has been damaged by a disease (such as coronary artery disease) or heart condition (such as a heart attack). An ICD consists of a battery that lasts several years, a small computer called a pulse generator, and wires called leads that go into the heart. It is used to detect and correct two dangerous arrhythmias: a rapid heart rhythm (tachycardia) and an arrhythmia in which the ventricles contract in an uncoordinated way (fibrillation). When an ICD detects tachycardia, it sends an electrical signal to the heart that restores the heartbeat to normal (cardioversion). This signal is usually painless. If cardioversion does not work or if the ICD detects fibrillation, it delivers a small electrical shock to the heart (defibrillation) to restart the heart. The shock may feel like a strong jolt in the chest. ICDs may be programmed to correct other problems. Sometimes, ICDs are programmed to act as another type of implantable device called a pacemaker. Pacemakers are used to treat a slow heartbeat (bradycardia). LET YOUR CAREGIVER KNOW ABOUT:  Any allergies you have.  All medicines you are taking, including vitamins, herbs, eyedrops, and over-the-counter medicines and creams.  Previous problems you or members of your family have had with the use of anesthetics.  Any blood disorders you have had.  Other health problems you have. RISKS AND COMPLICATIONS Generally, the procedure to implant an ICD is safe. However, as with any surgical procedure,  complications can occur. Possible complications associated with implanting an ICD include:  Swelling, bleeding, or bruising at the site where the ICD was implanted.  Infection at the site where the ICD was implanted.  A reaction to medicine used during the procedure.  Nerve, heart, or blood vessel damage.  Blood clots. BEFORE THE PROCEDURE  You may need to have blood tests, heart tests, or a chest X-ray done before the day of the procedure.  Ask your caregiver about changing or stopping your regular medicines.  Make plans to have someone drive you home. You may need to stay in the hospital overnight after the procedure.  Stop smoking at least 24 hours before the procedure.  Take a bath or shower the night before the procedure. You may need to scrub your chest or abdomen with a special type of soap.  Do not eat or drink before your procedure for as long as directed by your caregiver. Ask if it is okay to take any needed medicine with a small sip of water. PROCEDURE  The procedure to implant an ICD in your chest or abdomen is usually done at a hospital in a room that has a large X-ray machine called a fluoroscope. The machine will be above you during the procedure. It will help your caregiver see your heart during the procedure. Implanting an ICD usually takes 1-3 hours. Before the procedure:   Small monitors will be put on your body. They will be used to check your heart, blood pressure, and oxygen level.  A needle will be put into a vein in your hand or arm. This is called an intravenous (IV) access tube. Fluids and medicine will flow directly into your body through the IV tube.  Your chest or abdomen will be cleaned with a germ-killing (antiseptic) solution. The area may be shaved.  You may be given medicine to help you relax (sedative).  You will be given a medicine called a local anesthetic. This medicine will make the surgical site numb while the ICD is implanted. You will be  sleepy but awake during the  procedure. After you are numb the procedure will begin. The caregiver will:  Make a small cut (incision). This will make a pocket deep under your skin that will hold the pulse generator.  Guide the leads through a large blood vessel into your heart and attach them to the heart muscles. Depending on the ICD, the leads may go into one ventricle or they may go to both ventricles and into an upper chamber of the heart (atrium).  Test the ICD.  Close the incision with stitches, glue, or staples. AFTER THE PROCEDURE  You may feel pain. Some pain is normal. It may last a few days.  You may stay in a recovery area until the local anesthetic has worn off. Your blood pressure and pulse will be checked often. You will be taken to a room where your heart will be monitored.  A chest X-ray will be taken. This is done to check that the cardioverter defibrillator is in the right place.  You may stay in the hospital overnight.  A slight bump may be seen over the skin where the ICD was placed. Sometimes, it is possible to feel the ICD under the skin. This is normal.  In the months and years afterward, your caregiver will check the device, the leads, and the battery every few months. Eventually, when the battery is low, the ICD will be replaced.   This information is not intended to replace advice given to you by your health care provider. Make sure you discuss any questions you have with your health care provider.   Document Released: 06/26/2002 Document Revised: 07/25/2013 Document Reviewed: 10/23/2012 Elsevier Interactive Patient Education 2016 [place] Defibrillator Implantation, Care After This sheet gives you information about how to care for yourself after your procedure. Your health care provider may also give you more specific instructions. If you have problems or questions, contact your health care provider. What can I expect after the  procedure? After the procedure, it is common to have:  Some pain. It may last a few days.  A slight bump over the skin where the device was placed. Sometimes, it is possible to feel the device under the skin. This is normal.  During the months and years after your procedure, your health care provider will check the device, the leads, and the battery every few months. Eventually, when the battery is low, the device will be replaced. Follow these instructions at home: Medicines  Take over-the-counter and prescription medicines only as told by your health care provider.  If you were prescribed an antibiotic medicine, take it as told by your health care provider. Do not stop taking the antibiotic even if you start to feel better. Incision care   Follow instructions from your health care provider about how to take care of your incision area. Make sure you: ? Wash your hands with soap and water before you change your bandage (dressing). If soap and water are not available, use hand sanitizer. ? Change your dressing as told by your health care provider. ? Leave stitches (sutures), skin glue, or adhesive strips in place. These skin closures may need to stay in place for 2 weeks or longer. If adhesive strip edges start to loosen and curl up, you may trim the loose edges. Do not remove adhesive strips completely unless your health care provider tells you to do that.  Check your incision area every day for signs of infection. Check for: ? More redness, swelling, or  pain. ? More fluid or blood. ? Warmth. ? Pus or a bad smell.  Do not use lotions or ointments near the incision area unless told by your health care provider.  Keep the incision area clean and dry for 2-3 days after the procedure or for as long as told by your health care provider. It takes several weeks for the incision site to heal completely.  Do not take baths, swim, or use a hot tub until your health care provider approves.  Activity  Try to walk a little every day. Exercising is important after this procedure. Also, use your shoulder on the side of the defibrillator in daily tasks that do not require a lot of motion.  For at least 6 weeks: ? Do not lift your upper arm above your shoulders. This means no tennis, golf, or swimming for this period of time. If you tend to sleep with your arm above your head, use a restraint to prevent this during sleep. ? Avoid sudden jerking, pulling, or chopping movements that pull your upper arm far away from your body.  Ask your health care provider when you may go back to work.  Check with your health care provider before you start to drive or play sports. Electric and magnetic fields  Tell all health care providers that you have a defibrillator. This may prevent them from giving you an MRI scan because strong magnets are used for that test.  If you must pass through a metal detector, quickly walk through it. Do not stop under the detector, and do not stand near it.  Avoid places or objects that have a strong electric or magnetic field, including: ? Airport [employer]. At the airport, let officials know that you have a defibrillator. Your defibrillator ID card will let you be checked in a way that is safe for you and will not damage your defibrillator. Also, do not let a security person wave a magnetic wand near your defibrillator. That can make it stop working. ? Power plants. ? Large electrical generators. ? Anti-theft systems or electronic article surveillance (EAS). ? Radiofrequency transmission towers, such as cell phone and radio towers.  Do not use amateur (ham) radio equipment or electric (arc) welding torches. Some devices are safe to use if held at least 12 inches (30 cm) from your defibrillator. These include power tools, lawn mowers, and speakers. If you are unsure if something is safe to use, ask your health care provider.  Do not use MP3 player headphones.  They have magnets.  You may safely use electric blankets, heating pads, computers, and microwave ovens.  When using your cell phone, hold it to the ear that is on the opposite side from the defibrillator. Do not leave your cell phone in a pocket over the defibrillator. General instructions  Follow diet instructions from your health care provider, if this applies.  Always keep your defibrillator ID card with you. The card should list the implant date, device model, and manufacturer. Consider wearing a medical alert bracelet or necklace.  Have your defibrillator checked every 3-6 months or as often as told by your health care provider. Most defibrillators last for 4-8 years.  Keep all follow-up visits as told by your health care provider. This is important for your health care provider to make sure your chest is healing the way it should. Ask your health care provider when you should come back to have your stitches or staples taken out. Contact a health care provider  if:  You feel one shock in your chest.  You gain weight suddenly.  Your legs or feet swell more than they have before.  It feels like your heart is fluttering or skipping beats (heart palpitations).  You have more redness, swelling, or pain around your incision.  You have more fluid or blood coming from your incision.  Your incision feels warm to the touch.  You have pus or a bad smell coming from your incision.  You have a fever. Get help right away if:  You have chest pain.  You feel more than one shock.  You feel more short of breath than you have felt before.  You feel more light-headed than you have felt before.  Your incision starts to open up. This information is not intended to replace advice given to you by your health care provider. Make sure you discuss any questions you have with your health care provider. Document Released: 04/23/2005 Document Revised: 04/23/2016 Document Reviewed: 03/10/2016  Elsevier Interactive Patient Education  2018 [place] Discharge Instructions for  Pacemaker/Defibrillator Patients  ACTIVITY No heavy lifting or vigorous activity with your left/right arm for 6 to 8 weeks.  Do not raise your left/right arm above your head for one week.  Gradually raise your affected arm as drawn below.           __  NO DRIVING for     ; you may begin driving on     .  WOUND CARE - Keep the wound area clean and dry.  Do not get this area wet for one week. No showers for one week; you may shower on     . - The tape/steri-strips on your wound will fall off; do not pull them off.  No bandage is needed on the site.  DO  NOT apply any creams, oils, or ointments to the wound area. - If you notice any drainage or discharge from the wound, any swelling or bruising at the site, or you develop a fever > 101? F after you are discharged home, call the office at once.  SPECIAL INSTRUCTIONS - You are still able to use cellular telephones; use the ear opposite the side where you have your pacemaker/defibrillator.  Avoid carrying your cellular phone near your device. - When traveling through airports, show security personnel your identification card to avoid being screened in the metal detectors.  Ask the security personnel to use the hand wand. - Avoid arc welding equipment, MRI testing (magnetic resonance imaging), TENS units (transcutaneous nerve stimulators).  Call the office for questions about other devices. - Avoid electrical appliances that are in poor condition or are not properly grounded. - Microwave ovens are safe to be near or to operate.  ADDITIONAL INFORMATION FOR DEFIBRILLATOR PATIENTS SHOULD YOUR DEVICE GO OFF: - If your device goes off ONCE and you feel fine afterward, notify the device clinic nurses. - If your device goes off ONCE and you do not feel well afterward, call 911. - If your device goes off TWICE, call 911. - If your device goes  off [place], call 911.  DO NOT DRIVE YOURSELF OR A FAMILY MEMBER WITH A DEFIBRILLATOR TO THE HOSPITAL-CALL 911.

## 2019-07-17 NOTE — Progress Notes (Signed)
Electrophysiology Office Note   Date:  07/17/2019   ID:  Jennifer [name], DOB 23-Oct-1955, MRN [id number]  PCP:  Monico Blitz, MD  Cardiologist: [place] Primary Electrophysiologist:  Kassady Laboy Meredith Leeds, MD    Chief Complaint: CHF   History of Present Illness: Jennifer [name] is a 63 y.o. female who is being seen today for the evaluation of CHF at the request of Bensimhon, Shaune Pascal, MD. Presenting today for electrophysiology evaluation.  She has a history of DVT/PE, fibromyalgia, and a chronic systolic heart failure.  Left heart catheterization 09/21/2018 showed a 90% proximal LAD and a 50% circumflex stenosis.  She underwent CABG x2 09/22/2018.  Today, she denies symptoms of palpitations, chest pain, shortness of breath, orthopnea, PND, lower extremity edema, claudication, dizziness, presyncope, syncope, bleeding, or neurologic sequela. The patient is tolerating medications without difficulties.  She is able to do all of her daily activities without restriction.  Past Medical History:  Diagnosis Date  . Arthritis    "probably; maybe in my hands" (09/21/2018)  . Congestive heart failure (CHF) ([place])   . Dyspnea   . Fibromyalgia   . GERD (gastroesophageal reflux disease)   . Hypercholesteremia   . Hypertension   . IBS (irritable bowel syndrome)   . Mild sleep apnea    no CPAP (09/21/2018)  . Myocardial infarction [facility])    "was told I have had one; never knew about it" (09/21/2018)  . Nonischemic cardiomyopathy (HCC)    ECHO EF 40-45%, Septal Thickness,and mild global left ventricular dysfunction; stress test 03/2004 +VE, refersible defect; ECHO 03/2005 @[facility]  EF 55%  . Pulmonary embolus [facility])    Past Surgical History:  Procedure Laterality Date  . ANKLE FRACTURE SURGERY Left 2000  . BUNIONECTOMY Right 1990s?   "& removed part of my great toe due to fungus under nail"  . CARDIAC CATHETERIZATION  03/2005  . CORONARY ARTERY BYPASS GRAFT N/A 09/22/2018   Procedure: CORONARY ARTERY  BYPASS GRAFTING (CABG), ON PUMP, TIMES TWO, USING LEFT INTERNAL MAMMARY ARTERY AND ENDOSCOPICALLY HARVESTED RIGHT GREATER SAPHENOUS VEIN;  Surgeon: Melrose Nakayama, MD;  Location: [place];  Service: Open Heart Surgery;  Laterality: N/A;  . DILATION AND CURETTAGE OF UTERUS    . FRACTURE SURGERY    . RIGHT HEART CATH N/A 12/05/2018   Procedure: RIGHT HEART CATH;  Surgeon: Jolaine Artist, MD;  Location: [place] CV LAB;  Service: Cardiovascular;  Laterality: N/A;  . RIGHT/LEFT HEART CATH AND CORONARY ANGIOGRAPHY N/A 09/21/2018   Procedure: RIGHT/LEFT HEART CATH AND CORONARY ANGIOGRAPHY;  Surgeon: Troy Sine, MD;  Location: [place] CV [facility];  Service: Cardiovascular;  Laterality: N/A;  . TEE WITHOUT CARDIOVERSION N/A 09/22/2018   Procedure: TRANSESOPHAGEAL ECHOCARDIOGRAM (TEE);  Surgeon: Melrose Nakayama, MD;  Location: [place];  Service: Open Heart Surgery;  Laterality: N/A;  . TUBAL LIGATION       Current Outpatient Medications  Medication Sig Dispense Refill  . aspirin EC 81 MG EC tablet Take 1 tablet (81 mg total) by mouth every evening. 30 tablet 0  . atorvastatin (LIPITOR) 80 MG tablet Take 1 tablet (80 mg total) by mouth daily at 6 PM. 30 tablet 1  . carvedilol (COREG) 6.25 MG tablet Take 1.5 tablets (9.375 mg total) by mouth 2 (two) times daily. 90 tablet 11  . cholecalciferol (VITAMIN D3) 25 MCG (1000 UT) tablet Take 1,000 Units by mouth daily.    . dapagliflozin propanediol (FARXIGA) 10 MG TABS tablet Take 10 mg  by mouth daily before breakfast. 30 tablet 6  . digoxin (LANOXIN) 0.125 MG tablet Take 1 tablet (0.125 mg total) by mouth daily. 90 tablet 3  . DULoxetine (CYMBALTA) 60 MG capsule Take 60 mg by mouth 2 (two) times daily.    . furosemide (LASIX) 40 MG tablet Take 1 tablet (40 mg total) by mouth every other day. May take 1 additional tab as needed for weight gain. 120 tablet 3  . gabapentin (NEURONTIN) 300 MG capsule Take 600 mg by mouth 3 (three) times daily.     . ivabradine (CORLANOR) 7.5 MG TABS tablet TAKE 1 TABLET (7.5 MG TOTAL) BY MOUTH 2 (TWO) TIMES DAILY WITH A MEAL. 60 tablet 6  . pramipexole (MIRAPEX) 0.5 MG tablet Take 0.5 mg by mouth at bedtime.     . rivaroxaban (XARELTO) 20 MG TABS tablet Take 1 tablet (20 mg total) by mouth daily with breakfast. 30 tablet 6  . sacubitril-valsartan (ENTRESTO) 97-103 MG Take 1 tablet by mouth 2 (two) times daily. 180 tablet 3  . spironolactone (ALDACTONE) 25 MG tablet Take 1 tablet (25 mg total) by mouth daily. 90 tablet 3  . vitamin B-12 (CYANOCOBALAMIN) 500 MCG tablet Take 1 tablet (500 mcg total) by mouth daily. 30 tablet 0  . vitamin C (ASCORBIC ACID) 500 MG tablet Take 500 mg by mouth daily.     No current facility-administered medications for this visit.     Allergies:   Poison oak extract   Social History:  The patient  reports that she quit smoking about 28 years ago. Her smoking use included cigarettes. She has a 30.00 pack-year smoking history. She has never used smokeless tobacco. She reports previous alcohol use. She reports that she does not use drugs.   Family History:  The patient's family history includes Alcohol abuse in her father; Colon cancer in her father; Congestive Heart Failure (age of onset: 27) in her brother; Depression in her father; Diabetes Mellitus II in her mother; Heart disease (age of onset: 61) in her mother.    ROS:  Please see the history of present illness.   Otherwise, review of systems is positive for none.   All other systems are reviewed and negative.    PHYSICAL EXAM: VS:  BP 98/64   Pulse (!) 46   Ht 5\' 2"  (1.575 m)   Wt 190 lb 9.6 oz (86.5 kg)   SpO2 96%   BMI 34.86 kg/m  , BMI Body mass index is 34.86 kg/m. GEN: Well nourished, well developed, in no acute distress  HEENT: normal  Neck: no JVD, carotid bruits, or masses Cardiac: RRR; no murmurs, rubs, or gallops,no edema  Respiratory:  clear to auscultation bilaterally, normal work of breathing GI:  soft, nontender, nondistended, + BS MS: no deformity or atrophy  Skin: warm and dry Neuro:  Strength and sensation are intact Psych: euthymic mood, full affect  EKG:  EKG is not ordered today. Personal review of the ekg ordered 06/26/19 shows SR  Recent Labs: 11/27/2018: ALT 71 12/07/2018: TSH 2.343 02/26/2019: B Natriuretic Peptide 351.3; Hemoglobin 14.5; Platelets 242 04/11/2019: BUN 22; Creatinine, Ser 0.98; Magnesium 2.2; Potassium 4.3; Sodium 139    Lipid Panel  No results found for: CHOL, TRIG, HDL, CHOLHDL, VLDL, LDLCALC, LDLDIRECT   Wt Readings from Last 3 Encounters:  07/17/19 190 lb 9.6 oz (86.5 kg)  06/26/19 187 lb 6.4 oz (85 kg)  04/11/19 188 lb 3.2 oz (85.4 kg)      Other studies  Reviewed: Additional studies/ records that were reviewed today include: TTE 06/26/19  Review of the above records today demonstrates:   1. The left ventricle has severely reduced systolic function, with an ejection fraction of 20-25%. The cavity size was moderately dilated. Left ventricular diastolic Doppler parameters are consistent with restrictive filling. Left ventrical global  hypokinesis without regional wall motion abnormalities.  2. The right ventricle has normal systolic function. The cavity was normal. There is no increase in right ventricular wall thickness.  3. Left atrial size was severely dilated.  4. Mild thickening of the mitral valve leaflet. Mitral valve regurgitation is severe by color flow Doppler.  5. The aortic valve is tricuspid. Mild calcification of the aortic valve. No stenosis of the aortic valve.  6. The aorta is normal unless otherwise noted.  Cardiac monitor personally reviewed  Predominant rhythm sinus rhythm Average heart rate 65 22 VT runs Longest and fastest interval 12 beats with a heart rate of 179 bpm 2.8% PVCs, rare PACs  ASSESSMENT AND PLAN:  1.  Chronic systolic heart failure due to ischemic and nonischemic cardiomyopathy: Diagnosed by cardiac MRI.   Has class II symptoms on Entresto, Lasix, carvedilol, Aldactone.  Her ejection fraction remains low and thus she would benefit from an ICD.  Risks and benefits were discussed include bleeding, tamponade, infection, pneumothorax.  The patient understands these risks and is agreed to proceed.  2.  Coronary artery disease status post CABG no current chest pain.  Has used statin and Xarelto.  3.  PVCs: Doing well on carvedilol.  2.8% PVCs.  Unlikely the cause of her cardiomyopathy.  4.  Obstructive sleep apnea: CPAP compliance encouraged  Current medicines are reviewed at length with the patient today.   The patient does not have concerns regarding her medicines.  The following changes were made today:  none  Labs/ tests ordered today include:  Orders Placed This Encounter  Procedures  . Basic metabolic panel  . CBC   Plan discussed with primary cardiology  Disposition:   FU with Bennie Scaff 3 months  Signed, Keven Soucy Meredith Leeds, MD  07/17/2019 11:34 AM     [facility] [address] [place] [address] [phone] (office) [phone] (fax)

## 2019-07-18 LAB — CBC
Hematocrit: 39.4 % (ref 34.0–46.6)
Hemoglobin: 13.6 g/dL (ref 11.1–15.9)
MCH: 32.9 pg (ref 26.6–33.0)
MCHC: 34.5 g/dL (ref 31.5–35.7)
MCV: 95 fL (ref 79–97)
Platelets: 263 10*3/uL (ref 150–450)
RBC: 4.14 x10E6/uL (ref 3.77–5.28)
RDW: 11.3 % — ABNORMAL LOW (ref 11.7–15.4)
WBC: 6.8 10*3/uL (ref 3.4–10.8)

## 2019-07-18 LAB — BASIC METABOLIC PANEL
BUN/Creatinine Ratio: 24 (ref 12–28)
BUN: 21 mg/dL (ref 8–27)
CO2: 26 mmol/L (ref 20–29)
Calcium: 8.7 mg/dL (ref 8.7–10.3)
Chloride: 103 mmol/L (ref 96–106)
Creatinine, Ser: 0.89 mg/dL (ref 0.57–1.00)
GFR calc Af Amer: 80 mL/min/{1.73_m2} (ref >59)
GFR calc non Af Amer: 69 mL/min/{1.73_m2} (ref >59)
Glucose: 137 mg/dL — ABNORMAL HIGH (ref 65–99)
Potassium: 4.7 mmol/L (ref 3.5–5.2)
Sodium: 142 mmol/L (ref 134–144)

## 2019-07-21 ENCOUNTER — Other Ambulatory Visit (HOSPITAL_COMMUNITY)
Admission: RE | Admit: 2019-07-21 | Discharge: 2019-07-21 | Disposition: A | Discharge status: Home / Self Care | Payer: Medicare PPO | Source: Ambulatory Visit | Attending: Cardiology | Admitting: Cardiology

## 2019-07-21 DIAGNOSIS — Z20828 Contact with and (suspected) exposure to other viral communicable diseases: Secondary | ICD-10-CM | POA: Diagnosis not present

## 2019-07-21 DIAGNOSIS — Z01812 Encounter for preprocedural laboratory examination: Secondary | ICD-10-CM | POA: Insufficient documentation

## 2019-07-23 LAB — NOVEL CORONAVIRUS, NAA (HOSP ORDER, SEND-OUT TO REF LAB; TAT 18-24 HRS): SARS-CoV-2, NAA: NOT DETECTED

## 2019-07-24 ENCOUNTER — Other Ambulatory Visit (HOSPITAL_COMMUNITY): Payer: Self-pay

## 2019-07-24 MED ORDER — FARXIGA 10 MG PO TABS: 10.0000 mg | ORAL_TABLET | Freq: Every day | ORAL | 6 refills | Status: DC

## 2019-07-24 NOTE — Addendum Note (Signed)
Encounter addended by: Micki Riley, RN on: 07/24/2019 11:49 AM  Actions taken: Imaging Exam ended

## 2019-07-24 NOTE — Telephone Encounter (Signed)
Sent in OOP expense that patient sent to me showing she has spend more than the $61 required to qualify for assistance.   Will continue to follow.  Charlann Boxer, CPhT

## 2019-07-25 ENCOUNTER — Ambulatory Visit (HOSPITAL_COMMUNITY)
Admission: RE | Disposition: A | Discharge status: Home / Self Care | Payer: Self-pay | Source: Home / Self Care | Attending: Cardiology

## 2019-07-25 ENCOUNTER — Ambulatory Visit (HOSPITAL_COMMUNITY): Payer: Medicare PPO

## 2019-07-25 ENCOUNTER — Other Ambulatory Visit: Payer: Self-pay

## 2019-07-25 ENCOUNTER — Ambulatory Visit (HOSPITAL_COMMUNITY)
Admission: RE | Admit: 2019-07-25 | Discharge: 2019-07-25 | Disposition: A | Discharge status: Home / Self Care | Payer: Medicare PPO | Attending: Cardiology | Admitting: Cardiology

## 2019-07-25 DIAGNOSIS — M797 Fibromyalgia: Secondary | ICD-10-CM | POA: Insufficient documentation

## 2019-07-25 DIAGNOSIS — Z87891 Personal history of nicotine dependence: Secondary | ICD-10-CM | POA: Diagnosis not present

## 2019-07-25 DIAGNOSIS — I493 Ventricular premature depolarization: Secondary | ICD-10-CM | POA: Diagnosis not present

## 2019-07-25 DIAGNOSIS — I252 Old myocardial infarction: Secondary | ICD-10-CM | POA: Insufficient documentation

## 2019-07-25 DIAGNOSIS — Z7982 Long term (current) use of aspirin: Secondary | ICD-10-CM | POA: Diagnosis not present

## 2019-07-25 DIAGNOSIS — G4733 Obstructive sleep apnea (adult) (pediatric): Secondary | ICD-10-CM | POA: Diagnosis not present

## 2019-07-25 DIAGNOSIS — I2510 Atherosclerotic heart disease of native coronary artery without angina pectoris: Secondary | ICD-10-CM | POA: Insufficient documentation

## 2019-07-25 DIAGNOSIS — I5022 Chronic systolic (congestive) heart failure: Secondary | ICD-10-CM | POA: Insufficient documentation

## 2019-07-25 DIAGNOSIS — Z7901 Long term (current) use of anticoagulants: Secondary | ICD-10-CM | POA: Insufficient documentation

## 2019-07-25 DIAGNOSIS — I255 Ischemic cardiomyopathy: Secondary | ICD-10-CM | POA: Diagnosis not present

## 2019-07-25 DIAGNOSIS — Z86711 Personal history of pulmonary embolism: Secondary | ICD-10-CM | POA: Diagnosis not present

## 2019-07-25 DIAGNOSIS — I110 Hypertensive heart disease with heart failure: Secondary | ICD-10-CM | POA: Diagnosis not present

## 2019-07-25 DIAGNOSIS — Z79899 Other long term (current) drug therapy: Secondary | ICD-10-CM | POA: Insufficient documentation

## 2019-07-25 DIAGNOSIS — Z95818 Presence of other cardiac implants and grafts: Secondary | ICD-10-CM

## 2019-07-25 DIAGNOSIS — Z955 Presence of coronary angioplasty implant and graft: Secondary | ICD-10-CM | POA: Diagnosis not present

## 2019-07-25 DIAGNOSIS — M1990 Unspecified osteoarthritis, unspecified site: Secondary | ICD-10-CM | POA: Diagnosis not present

## 2019-07-25 DIAGNOSIS — E7800 Pure hypercholesterolemia, unspecified: Secondary | ICD-10-CM | POA: Insufficient documentation

## 2019-07-25 DIAGNOSIS — I509 Heart failure, unspecified: Secondary | ICD-10-CM | POA: Diagnosis not present

## 2019-07-25 DIAGNOSIS — Z950 Presence of cardiac pacemaker: Secondary | ICD-10-CM | POA: Diagnosis not present

## 2019-07-25 DIAGNOSIS — K219 Gastro-esophageal reflux disease without esophagitis: Secondary | ICD-10-CM | POA: Insufficient documentation

## 2019-07-25 HISTORY — PX: ICD IMPLANT: EP1208

## 2019-07-25 LAB — SURGICAL PCR SCREEN
MRSA, PCR: NEGATIVE
Staphylococcus aureus: NEGATIVE

## 2019-07-25 SURGERY — ICD IMPLANT

## 2019-07-25 MED ORDER — MUPIROCIN 2 % EX OINT
1.0000 "application " | TOPICAL_OINTMENT | Freq: Once | CUTANEOUS | Status: AC
  Administered 2019-07-25: 1 via TOPICAL

## 2019-07-25 MED ORDER — FENTANYL CITRATE (PF) 100 MCG/2ML IJ SOLN
INTRAMUSCULAR | Status: AC
  Filled 2019-07-25: qty 2

## 2019-07-25 MED ORDER — MIDAZOLAM HCL 5 MG/5ML IJ SOLN
INTRAMUSCULAR | Status: AC
  Filled 2019-07-25: qty 5

## 2019-07-25 MED ORDER — CEFAZOLIN SODIUM-DEXTROSE 2-4 GM/100ML-% IV SOLN
2.0000 g | INTRAVENOUS | Status: AC
  Administered 2019-07-25: 2 g via INTRAVENOUS

## 2019-07-25 MED ORDER — HEPARIN (PORCINE) IN NACL 1000-0.9 UT/500ML-% IV SOLN
INTRAVENOUS | Status: DC | PRN
  Administered 2019-07-25: 500 mL

## 2019-07-25 MED ORDER — SODIUM CHLORIDE 0.9 % IV SOLN
INTRAVENOUS | Status: AC
  Filled 2019-07-25: qty 2

## 2019-07-25 MED ORDER — SODIUM CHLORIDE 0.9 % IV SOLN
80.0000 mg | INTRAVENOUS | Status: AC
  Administered 2019-07-25: 10:00:00 80 mg
  Filled 2019-07-25: qty 2

## 2019-07-25 MED ORDER — MUPIROCIN 2 % EX OINT
TOPICAL_OINTMENT | CUTANEOUS | Status: AC
  Filled 2019-07-25: qty 22

## 2019-07-25 MED ORDER — ACETAMINOPHEN 325 MG PO TABS: 325.0000 mg | ORAL_TABLET | ORAL | Status: DC | PRN

## 2019-07-25 MED ORDER — LIDOCAINE HCL 1 % IJ SOLN
INTRAMUSCULAR | Status: AC
  Filled 2019-07-25: qty 20

## 2019-07-25 MED ORDER — HEPARIN (PORCINE) IN NACL 1000-0.9 UT/500ML-% IV SOLN
INTRAVENOUS | Status: AC
  Filled 2019-07-25: qty 1000

## 2019-07-25 MED ORDER — SODIUM CHLORIDE 0.9 % IV SOLN
INTRAVENOUS | Status: DC
  Administered 2019-07-25: 07:00:00 via INTRAVENOUS

## 2019-07-25 MED ORDER — LIDOCAINE HCL (PF) 1 % IJ SOLN
INTRAMUSCULAR | Status: AC
  Filled 2019-07-25: qty 30

## 2019-07-25 MED ORDER — CEFAZOLIN SODIUM-DEXTROSE 1-4 GM/50ML-% IV SOLN: 1.0000 g | Freq: Four times a day (QID) | INTRAVENOUS | Status: DC

## 2019-07-25 MED ORDER — CHLORHEXIDINE GLUCONATE 4 % EX LIQD
4.0000 "application " | Freq: Once | CUTANEOUS | Status: DC
  Filled 2019-07-25: qty 60

## 2019-07-25 MED ORDER — FENTANYL CITRATE (PF) 100 MCG/2ML IJ SOLN
INTRAMUSCULAR | Status: DC | PRN
  Administered 2019-07-25 (×3): 25 ug via INTRAVENOUS

## 2019-07-25 MED ORDER — CEFAZOLIN SODIUM-DEXTROSE 2-4 GM/100ML-% IV SOLN
INTRAVENOUS | Status: AC
  Filled 2019-07-25: qty 100

## 2019-07-25 MED ORDER — MIDAZOLAM HCL 5 MG/5ML IJ SOLN
INTRAMUSCULAR | Status: DC | PRN
  Administered 2019-07-25 (×3): 1 mg via INTRAVENOUS

## 2019-07-25 MED ORDER — LIDOCAINE HCL (PF) 1 % IJ SOLN
INTRAMUSCULAR | Status: DC | PRN
  Administered 2019-07-25: 45 mL

## 2019-07-25 MED ORDER — ONDANSETRON HCL 4 MG/2ML IJ SOLN: 4.0000 mg | Freq: Four times a day (QID) | INTRAMUSCULAR | Status: DC | PRN

## 2019-07-25 SURGICAL SUPPLY — 8 items
CABLE SURGICAL S-101-97-12 (CABLE) ×2 IMPLANT
ICD ELLIPSE DR CD2411-36Q (ICD Generator) ×1 IMPLANT
LEAD DURATA 7122Q-58CM (Lead) ×1 IMPLANT
LEAD TENDRIL MRI 46CM LPA1200M (Lead) ×1 IMPLANT
PAD PRO RADIOLUCENT 2001M-C (PAD) ×2 IMPLANT
SHEATH 7FR PRELUDE SNAP 13 (SHEATH) ×1 IMPLANT
SHEATH 8FR PRELUDE SNAP 13 (SHEATH) ×1 IMPLANT
TRAY PACEMAKER INSERTION (PACKS) ×2 IMPLANT

## 2019-07-25 NOTE — Discharge Instructions (Signed)
Monitored Anesthesia Care, Care After These instructions provide you with information about caring for yourself after your procedure. Your health care provider may also give you more specific instructions. Your treatment has been planned according to current medical practices, but problems sometimes occur. Call your health care provider if you have any problems or questions after your procedure. What can I expect after the procedure? After your procedure, you may:  Feel sleepy for several hours.  Feel clumsy and have poor balance for several hours.  Feel forgetful about what happened after the procedure.  Have poor judgment for several hours.  Feel nauseous or vomit.  Have a sore throat if you had a breathing tube during the procedure. Follow these instructions at home: For at least 24 hours after the procedure:      Have a responsible adult stay with you. It is important to have someone help care for you until you are awake and alert.  Rest as needed.  Do not: ? Participate in activities in which you could fall or become injured. ? Drive. ? Use heavy machinery. ? Drink alcohol. ? Take sleeping pills or medicines that cause drowsiness. ? Make important decisions or sign legal documents. ? Take care of children on your own. Eating and drinking  Follow the diet that is recommended by your health care provider.  If you vomit, drink water, juice, or soup when you can drink without vomiting.  Make sure you have little or no nausea before eating solid foods. General instructions  Take over-the-counter and prescription medicines only as told by your health care provider.  If you have sleep apnea, surgery and certain medicines can increase your risk for breathing problems. Follow instructions from your health care provider about wearing your sleep device: ? Anytime you are sleeping, including during daytime naps. ? While taking prescription pain medicines, sleeping medicines,  or medicines that make you drowsy.  If you smoke, do not smoke without supervision.  Keep all follow-up visits as told by your health care provider. This is important. Contact a health care provider if:  You keep feeling nauseous or you keep vomiting.  You feel light-headed.  You develop a rash.  You have a fever. Get help right away if:  You have trouble breathing. Summary  For several hours after your procedure, you may feel sleepy and have poor judgment.  Have a responsible adult stay with you for at least 24 hours or until you are awake and alert. This information is not intended to replace advice given to you by your health care provider. Make sure you discuss any questions you have with your health care provider. Document Released: 01/25/2016 Document Revised: 01/02/2018 Document Reviewed: 01/25/2016 Elsevier Patient Education  [place] Defibrillator Implantation, Care After This sheet gives you information about how to care for yourself after your procedure. Your health care provider may also give you more specific instructions. If you have problems or questions, contact your health care provider. What can I expect after the procedure? After the procedure, it is common to have:  Some pain. It may last a few days.  A slight bump over the skin where the device was placed. Sometimes, it is possible to feel the device under the skin. This is normal. During the months and years after your procedure, your health care provider will check the device, the leads, and the battery every few months. Eventually, when the battery is low, the device will be replaced. Follow  these instructions at home: Medicines  Take over-the-counter and prescription medicines only as told by your health care provider.  If you were prescribed an antibiotic medicine, take it as told by your health care provider. Do not stop taking the antibiotic even if you start to feel  better. Incision care      Follow instructions from your health care provider about how to take care of your incision area. Make sure you: ? Wash your hands with soap and water before you change your bandage (dressing). If soap and water are not available, use hand sanitizer. ? Change your dressing as told by your health care provider. ? Leave stitches (sutures), skin glue, or adhesive strips in place. These skin closures may need to stay in place for 2 weeks or longer. If adhesive strip edges start to loosen and curl up, you may trim the loose edges. Do not remove adhesive strips completely unless your health care provider tells you to do that.  Check your incision area every day for signs of infection. Check for: ? More redness, swelling, or pain. ? More fluid or blood. ? Warmth. ? Pus or a bad smell.  Do not use lotions or ointments near the incision area unless told by your health care provider.  Keep the incision area clean and dry for 2-3 days after the procedure or for as long as told by your health care provider. It takes several weeks for the incision site to heal completely.  Do not take baths, swim, or use a hot tub until your health care provider approves. Activity  Try to walk a little every day. Exercising is important after this procedure. Also, use your shoulder on the side of the defibrillator in daily tasks that do not require a lot of motion.  For at least 6 weeks: ? Do not lift your upper arm above your shoulders. This means no tennis, golf, or swimming for this period of time. If you tend to sleep with your arm above your head, use a restraint to prevent this during sleep. ? Avoid sudden jerking, pulling, or chopping movements that pull your upper arm far away from your body.  Ask your health care provider when you may go back to work.  Check with your health care provider before you start to drive or play sports. Electric and magnetic fields  Tell all health  care providers that you have a defibrillator. This may prevent them from giving you an MRI scan because strong magnets are used for that test.  If you must pass through a metal detector, quickly walk through it. Do not stop under the detector, and do not stand near it.  Avoid places or objects that have a strong electric or magnetic field, including: ? Airport Herbalist. At the airport, let officials know that you have a defibrillator. Your defibrillator ID card will let you be checked in a way that is safe for you and will not damage your defibrillator. Also, do not let a security person wave a magnetic wand near your defibrillator. That can make it stop working. ? Power plants. ? Large electrical generators. ? Anti-theft systems or electronic article surveillance (EAS). ? Radiofrequency transmission towers, such as cell phone and radio towers.  Do not use amateur (ham) radio equipment or electric (arc) welding torches. Some devices are safe to use if held at least 12 inches (30 cm) from your defibrillator. These include power tools, lawn mowers, and speakers. If you are unsure if  something is safe to use, ask your health care provider.  Do not use MP3 player headphones. They have magnets.  You may safely use electric blankets, heating pads, computers, and microwave ovens.  When using your cell phone, hold it to the ear that is on the opposite side from the defibrillator. Do not leave your cell phone in a pocket over the defibrillator. General instructions  Follow diet instructions from your health care provider, if this applies.  Always keep your defibrillator ID card with you. The card should list the implant date, device model, and manufacturer. Consider wearing a medical alert bracelet or necklace.  Have your defibrillator checked every 3-6 months or as often as told by your health care provider. Most defibrillators last for 4-8 years.  Keep all follow-up visits as told by your  health care provider. This is important for your health care provider to make sure your chest is healing the way it should. Ask your health care provider when you should come back to have your stitches or staples taken out. Contact a health care provider if:  You feel one shock in your chest.  You gain weight suddenly.  Your legs or feet swell more than they have before.  It feels like your heart is fluttering or skipping beats (heart palpitations).  You have more redness, swelling, or pain around your incision.  You have more fluid or blood coming from your incision.  Your incision feels warm to the touch.  You have pus or a bad smell coming from your incision.  You have a fever. Get help right away if:  You have chest pain.  You feel more than one shock.  You feel more short of breath than you have felt before.  You feel more light-headed than you have felt before.  Your incision starts to open up. This information is not intended to replace advice given to you by your health care provider. Make sure you discuss any questions you have with your health care provider. Document Released: 04/23/2005 Document Revised: 12/29/2017 Document Reviewed: 03/10/2016 Elsevier Patient Education  2020 [employer].

## 2019-07-25 NOTE — H&P (Signed)
ICD Criteria  Current LVEF:20/25%. Within 12 months prior to implant: Yes   Heart failure history: Yes, Class II  Cardiomyopathy history: Yes, Non-Ischemic Cardiomyopathy.  Atrial Fibrillation/Atrial Flutter: No.  Ventricular tachycardia history: No.  Cardiac arrest history: No.  History of syndromes with risk of sudden death: No.  Previous ICD: No.  Current ICD indication: Primary  PPM indication: No.  Class I or II Bradycardia indication present: No  Beta Blocker therapy for 3 or more months: Yes, prescribed.   Ace Inhibitor/ARB therapy for 3 or more months: Yes, prescribed.    I have seen Jennifer [name] is a 63 y.o. femalepre-procedural and has been referred by Bensimhon for consideration of ICD implant for primary prevention of sudden death.  The patient's chart has been reviewed and they meet criteria for ICD implant.  I have had a thorough discussion with the patient reviewing options.  The patient and their family (if available) have had opportunities to ask questions and have them answered. The patient and I have decided together through the [place] Support Tool to implant ICD at this time.  Risks, benefits, alternatives to ICD implantation were discussed in detail with the patient today. The patient  understands that the risks include but are not limited to bleeding, infection, pneumothorax, perforation, tamponade, vascular damage, renal failure, MI, stroke, death, inappropriate shocks, and lead dislodgement and  wishes to proceed.

## 2019-07-26 ENCOUNTER — Encounter (HOSPITAL_COMMUNITY): Payer: Self-pay | Admitting: Cardiology

## 2019-07-26 MED FILL — Heparin Sod (Porcine)-NaCl IV Soln 1000 Unit/500ML-0.9%: INTRAVENOUS | Qty: 500 | Status: AC

## 2019-07-26 MED FILL — Lidocaine HCl Local Inj 1%: INTRAMUSCULAR | Qty: 60 | Status: AC

## 2019-07-27 NOTE — Telephone Encounter (Signed)
Received notification stating patient was denied, re-sent over OOP expense report via email so they could see the date more clearly. Informed patient of status as well.  Will follow up.  Charlann Boxer, CPhT

## 2019-07-30 ENCOUNTER — Telehealth: Payer: Self-pay

## 2019-07-30 NOTE — Telephone Encounter (Signed)
Called clinic with concern about intermittent "tingling" feeling at ICD  Site and under her left arm. No pain, no Sob. Reports that she has no swelling, drainage or pain at wound site. She called because she thought the sensation may be related to her ICD delivering a shock. Remote transmission sent and reviewed. No alerts, no episodes requiring ATP or shock. ICD function WNL. F/U for wound check 08/07/19. Patient to contact clinic if frequency of sensation increases or she develops swelling or pain .

## 2019-07-30 NOTE — Telephone Encounter (Signed)
The pt states she had some vibrations since she had her ICD put in. The pt states her incision site had some itchiness and some tingle from the incision site to under her armpits.

## 2019-08-01 NOTE — Telephone Encounter (Signed)
Advanced Heart Failure Patient Advocate Encounter   Patient was approved to receive Xarelto assistance from [employer]  Patient ID: [id number] Effective dates: 07/31/2019 through 10/18/2019  Called and gave patient the approval. She was very happy as am I to finally be able to get her assitance with receiving her Xarelto at a lower cost. Gave her co-pay card information to give to her pharmacy. Patient is agreeable to calling her pharmacy and providing them with this information. She is aware that we will have to reapply for assistance next year as well.  Co-pay card information  Bin: [id number]  PCN: None  Group: [id number]  ID: [id number]  Will assist next year with reapplication process.  Charlann Boxer, CPhT

## 2019-08-07 ENCOUNTER — Other Ambulatory Visit: Payer: Self-pay

## 2019-08-07 ENCOUNTER — Ambulatory Visit (INDEPENDENT_AMBULATORY_CARE_PROVIDER_SITE_OTHER): Payer: Medicare PPO | Admitting: Student

## 2019-08-07 DIAGNOSIS — I5022 Chronic systolic (congestive) heart failure: Secondary | ICD-10-CM | POA: Diagnosis not present

## 2019-08-07 LAB — CUP PACEART INCLINIC DEVICE CHECK
Battery Remaining Longevity: 100 mo
Brady Statistic RA Percent Paced: 2.8 %
Brady Statistic RV Percent Paced: 0.01 %
Date Time Interrogation Session: 20201020130109
HighPow Impedance: 61.875
Implantable Lead Implant Date: 20201007
Implantable Lead Implant Date: 20201007
Implantable Lead Location: 753859
Implantable Lead Location: 753860
Implantable Lead Model: 7122
Implantable Pulse Generator Implant Date: 20201007
Lead Channel Impedance Value: 475 Ohm
Lead Channel Impedance Value: 600 Ohm
Lead Channel Pacing Threshold Amplitude: 0.5 V
Lead Channel Pacing Threshold Amplitude: 0.5 V
Lead Channel Pacing Threshold Amplitude: 0.75 V
Lead Channel Pacing Threshold Amplitude: 0.75 V
Lead Channel Pacing Threshold Pulse Width: 0.5 ms
Lead Channel Pacing Threshold Pulse Width: 0.5 ms
Lead Channel Pacing Threshold Pulse Width: 0.5 ms
Lead Channel Pacing Threshold Pulse Width: 0.5 ms
Lead Channel Sensing Intrinsic Amplitude: 12 mV
Lead Channel Sensing Intrinsic Amplitude: 4.2 mV
Lead Channel Setting Pacing Amplitude: 3.5 V
Lead Channel Setting Pacing Amplitude: 3.5 V
Lead Channel Setting Pacing Pulse Width: 0.5 ms
Lead Channel Setting Sensing Sensitivity: 0.5 mV
Pulse Gen Serial Number: 9901215

## 2019-08-07 NOTE — Progress Notes (Signed)
Wound check appointment. Steri-strips removed. Wound without redness or edema. Incision edges approximated, wound well healed. Normal device function. Thresholds, sensing, and impedances consistent with implant measurements. Device programmed at 3.5V for extra safety margin until 3 month visit. Histogram distribution appropriate for patient and level of activity. No mode switches or ventricular arrhythmias noted. Patient educated about wound care, arm mobility, lifting restrictions, shock plan. ROV in 3 months with Dr Camnitz 

## 2019-08-20 ENCOUNTER — Telehealth (HOSPITAL_COMMUNITY): Payer: Self-pay | Admitting: Licensed Clinical Social Worker

## 2019-08-20 NOTE — Telephone Encounter (Signed)
CSW received notification that patient is now able to reapply for [employer] assistance with Entresto.  CSW called pt to confirm they had no other form of assistance at this time- pt states they have no other assistance and is agreeable to applying for [employer] foundation grant.  CSW confirmed all of patients information was still correct and submitted application for review.  Will receive an email within 4 days with determination  CSW will continue to follow and assist as needed  Leena Tiede H. Keyra Virella, LCSW Clinical Social Worker Advanced Heart Failure Clinic Desk#: [phone] Cell#: [phone]  

## 2019-08-22 ENCOUNTER — Telehealth (HOSPITAL_COMMUNITY): Payer: Self-pay | Admitting: Licensed Clinical Social Worker

## 2019-08-22 ENCOUNTER — Other Ambulatory Visit (HOSPITAL_COMMUNITY): Payer: Self-pay

## 2019-08-22 MED ORDER — SACUBITRIL-VALSARTAN 97-103 MG PO TABS: 1.0000 | ORAL_TABLET | Freq: Two times a day (BID) | ORAL | 3 refills | Status: DC

## 2019-08-22 NOTE — Telephone Encounter (Signed)
CSW received notification that pt was approved for [facility] assistance through [employer] foundation:  Approval Details . Patient ID Number: [id number] . Assistance Program: Heart Failure . Group Number: [id number] . As of August 22, 2019, your total balance is: 1,000.00 . Your assistance started on: 09/16/2018 and continues until 12/15/2019 . Expenses can be submitted until: Wednesday, February 13, 2020 . Covered Services: Corlanor (ivabradine hcl) and Entresto (sacubitril/valsartan) . Application Submitted by: Tammy Sours   CSW will continue to follow and assist as needed  Jorge Ny, [facility] Desk#: [phone] Cell#: [phone]

## 2019-08-28 ENCOUNTER — Ambulatory Visit (HOSPITAL_COMMUNITY)
Admission: RE | Admit: 2019-08-28 | Discharge: 2019-08-28 | Disposition: A | Discharge status: Home / Self Care | Payer: Medicare PPO | Source: Ambulatory Visit | Attending: Internal Medicine | Admitting: Internal Medicine

## 2019-08-28 ENCOUNTER — Other Ambulatory Visit: Payer: Self-pay

## 2019-08-28 ENCOUNTER — Encounter (HOSPITAL_COMMUNITY): Payer: Self-pay | Admitting: Internal Medicine

## 2019-08-28 VITALS — BP 120/80 | HR 63 | Wt 196.0 lb

## 2019-08-28 DIAGNOSIS — Z7982 Long term (current) use of aspirin: Secondary | ICD-10-CM | POA: Diagnosis not present

## 2019-08-28 DIAGNOSIS — E7800 Pure hypercholesterolemia, unspecified: Secondary | ICD-10-CM | POA: Insufficient documentation

## 2019-08-28 DIAGNOSIS — I2510 Atherosclerotic heart disease of native coronary artery without angina pectoris: Secondary | ICD-10-CM | POA: Diagnosis not present

## 2019-08-28 DIAGNOSIS — R0683 Snoring: Secondary | ICD-10-CM | POA: Diagnosis not present

## 2019-08-28 DIAGNOSIS — Z86711 Personal history of pulmonary embolism: Secondary | ICD-10-CM | POA: Diagnosis not present

## 2019-08-28 DIAGNOSIS — I471 Supraventricular tachycardia: Secondary | ICD-10-CM | POA: Diagnosis not present

## 2019-08-28 DIAGNOSIS — I493 Ventricular premature depolarization: Secondary | ICD-10-CM | POA: Diagnosis not present

## 2019-08-28 DIAGNOSIS — Z951 Presence of aortocoronary bypass graft: Secondary | ICD-10-CM | POA: Diagnosis not present

## 2019-08-28 DIAGNOSIS — Z7901 Long term (current) use of anticoagulants: Secondary | ICD-10-CM | POA: Insufficient documentation

## 2019-08-28 DIAGNOSIS — I5022 Chronic systolic (congestive) heart failure: Secondary | ICD-10-CM | POA: Diagnosis not present

## 2019-08-28 DIAGNOSIS — I252 Old myocardial infarction: Secondary | ICD-10-CM | POA: Diagnosis not present

## 2019-08-28 DIAGNOSIS — I110 Hypertensive heart disease with heart failure: Secondary | ICD-10-CM | POA: Insufficient documentation

## 2019-08-28 DIAGNOSIS — M1990 Unspecified osteoarthritis, unspecified site: Secondary | ICD-10-CM | POA: Insufficient documentation

## 2019-08-28 DIAGNOSIS — I255 Ischemic cardiomyopathy: Secondary | ICD-10-CM | POA: Diagnosis not present

## 2019-08-28 DIAGNOSIS — Z79899 Other long term (current) drug therapy: Secondary | ICD-10-CM | POA: Diagnosis not present

## 2019-08-28 DIAGNOSIS — Z7984 Long term (current) use of oral hypoglycemic drugs: Secondary | ICD-10-CM | POA: Diagnosis not present

## 2019-08-28 DIAGNOSIS — K589 Irritable bowel syndrome without diarrhea: Secondary | ICD-10-CM | POA: Insufficient documentation

## 2019-08-28 DIAGNOSIS — M797 Fibromyalgia: Secondary | ICD-10-CM | POA: Insufficient documentation

## 2019-08-28 DIAGNOSIS — Z8249 Family history of ischemic heart disease and other diseases of the circulatory system: Secondary | ICD-10-CM | POA: Insufficient documentation

## 2019-08-28 DIAGNOSIS — Z86718 Personal history of other venous thrombosis and embolism: Secondary | ICD-10-CM | POA: Diagnosis not present

## 2019-08-28 LAB — BASIC METABOLIC PANEL
Anion gap: 7 (ref 5–15)
BUN: 17 mg/dL (ref 8–23)
CO2: 24 mmol/L (ref 22–32)
Calcium: 9 mg/dL (ref 8.9–10.3)
Chloride: 107 mmol/L (ref 98–111)
Creatinine, Ser: 0.84 mg/dL (ref 0.44–1.00)
GFR calc Af Amer: >60 mL/min (ref >60)
GFR calc non Af Amer: >60 mL/min (ref >60)
Glucose, Bld: 86 mg/dL (ref 70–99)
Potassium: 4.9 mmol/L (ref 3.5–5.1)
Sodium: 138 mmol/L (ref 135–145)

## 2019-08-28 LAB — BRAIN NATRIURETIC PEPTIDE: B Natriuretic Peptide: 148.5 pg/mL — ABNORMAL HIGH (ref 0.0–100.0)

## 2019-08-28 NOTE — Progress Notes (Signed)
Advanced Heart Failure Clinic Note  Date:  08/28/2019   ID:  Jennifer [name], DOB March 21, 1956, MRN [id number]  Location: Home  Provider location: [place] Advanced Heart Failure Type of Visit: Established patient   PCP:  Monico Blitz, MD  Cardiologist:  Kate Sable, MD Primary HF: Dr Haroldine Laws  EP: Dr Curt Bears  Chief Complaint: Heart Failure    History of Present Illness:  Jennifer [name] is a 63 y.o. female with a history of systolic HF due to mixed ICM/NICM, DVT, PE, fibromyalgia.   Long h/o reported mild NICM EF 40-45%. Echo 11/19 EF 15-20%  Catheterization done12/05/19revealed 90% proximal LAD and a 50% circumflex stenosis. She underwent bypass grafting x2 with an L IMA to the LAD and an SVG to the OM on 09/22/18 with Dr Roxan Hockey.  Post CABG EF remained depressed.   -cMRI 2/20 LVEF 21% Moderately reduced RV, moderateTR. No LGE - ECHO 02/2019 EF 20-25% RV severerly reduced   Zio Patch 5/20 7.3 % PVCs 1. Predominant underlying rhythm was Sinus Rhythm. Minimum HR of 56 bpm, max HR of 176 bpm, and avg HR of 77 bpm. 2. Three runs of NSVT - longest 10 beats 3. Seven runs of SVT - longest lasting 9 beats   Started carvedilol 03/13/2019 .  Underwent S[place] ICD 07/25/19   Here for routine f/u. Feels good. 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 No CP, SOB, orthopnea or PND. Taking all meds without problem. No dizziness.  Echo 9/20 EF 20-25%. RV ok. Personally reviewed  CPX 07/09/19 FVC 2.09 (73%)    FEV1 1.47 (65%)     FEV1/FVC 70 (88%)     MVV 70 (81%)     Resting HR: 52 Standing HR:48 Peak HR: 102  (65% age predicted max HR)  BP rest: 84/60 Standing BP:90/60 BP peak: 124/60  Peak VO2: 16.0 (89% predicted peak VO2)  VE/VCO2 slope: 29  OUES: 1.81  Peak RER: 1.05  VE/MVV: 67%   Cardiac studies:  Echo  02/19/2019 EF 20-25% RV severely reduced  11/28/2018 EF 10-15% with moderate RV dysfunction.   cMRI 12/05/18 1.  Moderately dilated LV with EF 21%, diffuse hypokinesis. 2. Normal RV size with moderately decreased systolic function, EF 123456. 3. Moderate TR, at least moderate central MR (likely functional). 4. No myocardial LGE, so no evidence for prior infarct, infiltrative disease, or myocarditis.  RHC 12/05/18 On milrinone 0.125 RA = 8 RV = 37/10 PA = 39/15 (25) PCW = 19 (v = 35) Fick cardiac output/index = 5.0/2.8 PVR = 1.1 WU Ao sat = 94% PA sat = 66%, 66% PaPi = 3.0 RA/PCW = 0.42  Past Medical History:  Diagnosis Date  . Arthritis    "probably; maybe in my hands" (09/21/2018)  . Congestive heart failure (CHF) ([place])   . Dyspnea   . Fibromyalgia   . GERD (gastroesophageal reflux disease)   . Hypercholesteremia   . Hypertension   . IBS (irritable bowel syndrome)   . Mild sleep apnea    no CPAP (09/21/2018)  . Myocardial infarction [facility])    "was told I have had one; never knew about it" (09/21/2018)  . Nonischemic cardiomyopathy (HCC)    ECHO EF 40-45%, Septal Thickness,and mild global left ventricular dysfunction; stress test 03/2004 +VE, refersible defect; ECHO 03/2005 @[facility]  EF 55%  . Pulmonary embolus [facility])    Past Surgical History:  Procedure Laterality Date  . ANKLE FRACTURE SURGERY Left 2000  .  BUNIONECTOMY Right 1990s?   "& removed part of my great toe due to fungus under nail"  . CARDIAC CATHETERIZATION  03/2005  . CORONARY ARTERY BYPASS GRAFT N/A 09/22/2018   Procedure: CORONARY ARTERY BYPASS GRAFTING (CABG), ON PUMP, TIMES TWO, USING LEFT INTERNAL MAMMARY ARTERY AND ENDOSCOPICALLY HARVESTED RIGHT GREATER SAPHENOUS VEIN;  Surgeon: Melrose Nakayama, MD;  Location: [place];  Service: Open Heart Surgery;  Laterality: N/A;  . DILATION AND CURETTAGE OF UTERUS    . FRACTURE SURGERY    . ICD IMPLANT N/A 07/25/2019   Procedure: ICD IMPLANT;  Surgeon: Constance Haw, MD;  Location: [place] CV LAB;  Service: Cardiovascular;  Laterality: N/A;  . RIGHT HEART CATH N/A  12/05/2018   Procedure: RIGHT HEART CATH;  Surgeon: Jolaine Artist, MD;  Location: [place] CV LAB;  Service: Cardiovascular;  Laterality: N/A;  . RIGHT/LEFT HEART CATH AND CORONARY ANGIOGRAPHY N/A 09/21/2018   Procedure: RIGHT/LEFT HEART CATH AND CORONARY ANGIOGRAPHY;  Surgeon: [place] Sine, MD;  Location: [place] CV LAB;  Service: Cardiovascular;  Laterality: N/A;  . TEE WITHOUT CARDIOVERSION N/A 09/22/2018   Procedure: TRANSESOPHAGEAL ECHOCARDIOGRAM (TEE);  Surgeon: Melrose Nakayama, MD;  Location: [place];  Service: Open Heart Surgery;  Laterality: N/A;  . TUBAL LIGATION       Current Outpatient Medications  Medication Sig Dispense Refill  . Ascorbic Acid (VITAMIN C) 1000 MG tablet Take 1,000 mg by mouth daily.    Marland Kitchen aspirin EC 81 MG EC tablet Take 1 tablet (81 mg total) by mouth every evening. 30 tablet 0  . atorvastatin (LIPITOR) 80 MG tablet Take 1 tablet (80 mg total) by mouth daily at 6 PM. 30 tablet 1  . calamine lotion Apply 1 application topically as needed for itching.    . carvedilol (COREG) 6.25 MG tablet Take 1.5 tablets (9.375 mg total) by mouth 2 (two) times daily. 90 tablet 11  . cholecalciferol (VITAMIN D3) 25 MCG (1000 UT) tablet Take 1,000 Units by mouth daily.    . dapagliflozin propanediol (FARXIGA) 10 MG TABS tablet Take 10 mg by mouth daily before breakfast. 30 tablet 6  . digoxin (LANOXIN) 0.125 MG tablet Take 1 tablet (0.125 mg total) by mouth daily. 90 tablet 3  . DULoxetine (CYMBALTA) 60 MG capsule Take 60 mg by mouth 2 (two) times daily.    . furosemide (LASIX) 40 MG tablet Take 1 tablet (40 mg total) by mouth every other day. May take 1 additional tab as needed for weight gain. (Patient taking differently: Take 40 mg by mouth See admin instructions. Take 40 mg by mouth every other day. May take an additional 40 mg dose as needed for weight gain.) 120 tablet 3  . gabapentin (NEURONTIN) 300 MG capsule Take 600 mg by mouth 3 (three) times daily.    .  ivabradine (CORLANOR) 7.5 MG TABS tablet TAKE 1 TABLET (7.5 MG TOTAL) BY MOUTH 2 (TWO) TIMES DAILY WITH A MEAL. (Patient taking differently: Take 7.5 mg by mouth 2 (two) times daily. ) 60 tablet 6  . Phenylephrine-APAP-guaiFENesin (MUCINEX SINUS-MAX PO) Take 2 tablets by mouth daily as needed (sinus headaches).    . pramipexole (MIRAPEX) 0.5 MG tablet Take 0.5 mg by mouth at bedtime.     . rivaroxaban (XARELTO) 20 MG TABS tablet Take 1 tablet (20 mg total) by mouth daily with breakfast. 30 tablet 6  . sacubitril-valsartan (ENTRESTO) 97-103 MG Take 1 tablet by mouth 2 (two) times daily. 180 tablet  3  . spironolactone (ALDACTONE) 25 MG tablet Take 1 tablet (25 mg total) by mouth daily. 90 tablet 3  . vitamin B-12 (CYANOCOBALAMIN) 500 MCG tablet Take 1 tablet (500 mcg total) by mouth daily. 30 tablet 0   No current facility-administered medications for this encounter.     Allergies:   Poison oak extract   Social History:  The patient  reports that she quit smoking about 28 years ago. Her smoking use included cigarettes. She has a 30.00 pack-year smoking history. She has never used smokeless tobacco. She reports previous alcohol use. She reports that she does not use drugs.   Family History:  The patient's family history includes Alcohol abuse in her father; Colon cancer in her father; Congestive Heart Failure (age of onset: 37) in her brother; Depression in her father; Diabetes Mellitus II in her mother; Heart disease (age of onset: 55) in her mother.   ROS:  Please see the history of present illness.   All other systems are personally reviewed and negative.  Vitals:   08/28/19 1351  BP: 120/80  Pulse: 63  SpO2: 97%    Exam:   General:  Well appearing. No resp difficulty HEENT: normal Neck: supple. no JVD. Carotids 2+ bilat; no bruits. No lymphadenopathy or thryomegaly appreciated. Cor: PMI nondisplaced. Regular rate & rhythm. 2/6 MR Lungs: clear Abdomen: obese soft, nontender,  nondistended. No hepatosplenomegaly. No bruits or masses. Good bowel sounds. Extremities: no cyanosis, clubbing, rash, edema Neuro: alert & orientedx3, cranial nerves grossly intact. moves all 4 extremities w/o difficulty. Affect pleasant   Recent Labs: 11/27/2018: ALT 71 12/07/2018: TSH 2.343 02/26/2019: B Natriuretic Peptide 351.3 04/11/2019: Magnesium 2.2 07/17/2019: BUN 21; Creatinine, Ser 0.89; Hemoglobin 13.6; Platelets 263; Potassium 4.7; Sodium 142  Personally reviewed   Wt Readings from Last 3 Encounters:  08/28/19 88.9 kg (196 lb)  07/25/19 86.2 kg (190 lb)  07/17/19 86.5 kg (190 lb 9.6 oz)      ASSESSMENT AND PLAN:  1. Biventricular Chronic Systolic HF -  Suspect mixed ICM/NICM (?PVCs)  - LHC 2019 90% LAD and 50% circumflex --> S/P CABG x2 LIMA to LAD and SVG to OM   - ECHO 11/2018 EF 10-15% moderate RV dysfunction.   - cMRI 11/2018  LVEF 21% Moderately reduced RV, moderateTR moderate to severe MR. No LGE - ECHO 02/2019 EF 20-25% RV severerly reduced - Echo 9/20 EF 20-25% severe MR - Zio Patch 5/20 with 7.3% PVCs.  - s/p ST ICD - Doing well. NYHA I-II. Volume status ok  - Continue coreg 9.375 mg twice a day.   - Continue digoxin 0.125 mg daily.  -On goal dose entresto 97/103 twice a day  -On spiro 25 daily  -Farxiga 10mg  daily - CPX test with preserved functional capacity but low HR. Will stop ivabradine  - ICD interrogated in clinic. No VT. Activity level 2-3 hours per day,.   2. H/O bilateral PE - Continue xarelto - No bleeding. Can consider reduction in dosing for chronic prevention from [facility]  3. CAD S/P CABG 09/2018 - LIMA to the LAD and an SVG to the OM on 12/6/19with Dr Roxan Hockey - No s/s of ischemia - Continue statin and xarelto.  4. Fibromyalgia - stable  5. PVCs - Zio Patch 5/20 with 7.3% PVCs.  - Improved  6. Snoring - Home sleep study 7/20. AHI 7.2 with desat down to 85%  7. Moderate to severe MR - likely due to LV  dysfunction/dilation. - if not improving  with treatment of HF can consider MitraClip down the road    Glori Bickers, MD  08/28/2019 2:01 PM  [place] [address] Heart and [place] [address] [phone] (office) [phone] (fax)

## 2019-08-28 NOTE — Patient Instructions (Signed)
Stop Ivabradine (Corlanor)  Labs done today, we will contact you for any abnormal readings  Your physician recommends that you schedule a follow-up appointment in: 4 months with echocardiogram  If you have any questions or concerns before your next appointment please send [place] a message through [place] or call our office at [phone].  At the [facility], you and your health needs are our priority. As part of our continuing mission to provide you with exceptional heart care, we have created designated Provider Care Teams. These Care Teams include your primary Cardiologist (physician) and Advanced Practice Providers (APPs- Physician Assistants and Nurse Practitioners) who all work together to provide you with the care you need, when you need it.   You may see any of the following providers on your designated Care Team at your next follow up: Marland Kitchen Dr Glori Bickers . Dr Loralie Champagne . Darrick Grinder, NP . Lyda Jester, PA   Please be sure to bring in all your medications bottles to every appointment.

## 2019-09-14 DIAGNOSIS — Z7189 Other specified counseling: Secondary | ICD-10-CM | POA: Diagnosis not present

## 2019-09-14 DIAGNOSIS — Z6833 Body mass index (BMI) 33.0-33.9, adult: Secondary | ICD-10-CM | POA: Diagnosis not present

## 2019-09-14 DIAGNOSIS — Z1331 Encounter for screening for depression: Secondary | ICD-10-CM | POA: Diagnosis not present

## 2019-09-14 DIAGNOSIS — E7800 Pure hypercholesterolemia, unspecified: Secondary | ICD-10-CM | POA: Diagnosis not present

## 2019-09-14 DIAGNOSIS — Z2821 Immunization not carried out because of patient refusal: Secondary | ICD-10-CM | POA: Diagnosis not present

## 2019-09-14 DIAGNOSIS — Z299 Encounter for prophylactic measures, unspecified: Secondary | ICD-10-CM | POA: Diagnosis not present

## 2019-09-14 DIAGNOSIS — R5383 Other fatigue: Secondary | ICD-10-CM | POA: Diagnosis not present

## 2019-09-14 DIAGNOSIS — Z0000 Encounter for general adult medical examination without abnormal findings: Secondary | ICD-10-CM | POA: Diagnosis not present

## 2019-09-14 DIAGNOSIS — Z1211 Encounter for screening for malignant neoplasm of colon: Secondary | ICD-10-CM | POA: Diagnosis not present

## 2019-09-14 DIAGNOSIS — Z1339 Encounter for screening examination for other mental health and behavioral disorders: Secondary | ICD-10-CM | POA: Diagnosis not present

## 2019-09-14 DIAGNOSIS — Z79899 Other long term (current) drug therapy: Secondary | ICD-10-CM | POA: Diagnosis not present

## 2019-09-14 DIAGNOSIS — E559 Vitamin D deficiency, unspecified: Secondary | ICD-10-CM | POA: Diagnosis not present

## 2019-10-18 DIAGNOSIS — I10 Essential (primary) hypertension: Secondary | ICD-10-CM | POA: Diagnosis not present

## 2019-10-18 DIAGNOSIS — Z299 Encounter for prophylactic measures, unspecified: Secondary | ICD-10-CM | POA: Diagnosis not present

## 2019-10-18 DIAGNOSIS — Z713 Dietary counseling and surveillance: Secondary | ICD-10-CM | POA: Diagnosis not present

## 2019-10-18 DIAGNOSIS — K529 Noninfective gastroenteritis and colitis, unspecified: Secondary | ICD-10-CM | POA: Diagnosis not present

## 2019-10-18 DIAGNOSIS — Z6836 Body mass index (BMI) 36.0-36.9, adult: Secondary | ICD-10-CM | POA: Diagnosis not present

## 2019-11-02 ENCOUNTER — Ambulatory Visit: Payer: Medicare PPO | Admitting: Cardiology

## 2019-11-02 ENCOUNTER — Encounter: Payer: Self-pay | Admitting: Cardiology

## 2019-11-02 ENCOUNTER — Other Ambulatory Visit: Payer: Self-pay

## 2019-11-02 VITALS — BP 132/66 | HR 88 | Ht 62.0 in | Wt 201.0 lb

## 2019-11-02 DIAGNOSIS — I255 Ischemic cardiomyopathy: Secondary | ICD-10-CM | POA: Diagnosis not present

## 2019-11-02 MED ORDER — CARVEDILOL 12.5 MG PO TABS: 12.5000 mg | ORAL_TABLET | Freq: Two times a day (BID) | ORAL | 3 refills | Status: DC

## 2019-11-02 NOTE — Patient Instructions (Addendum)
Medication Instructions:  Your physician has recommended you make the following change in your medication:  1. INCREASE Carvedilol to 12.5 mg twice a day  *If you need a refill on your cardiac medications before your next appointment, please call your pharmacy*  Labwork: None ordered If you have labs (blood work) drawn today and your tests are completely normal, you will receive your results only by:  [place] (if you have MyChart) OR  A paper copy in the mail If you have any lab test that is abnormal or we need to change your treatment, we will call you to review the results.  Testing/Procedures: None ordered  Follow-Up: Remote monitoring is used to monitor your Pacemaker or ICD from home. This monitoring reduces the number of office visits required to check your device to one time per year. It allows [place] to keep an eye on the functioning of your device to ensure it is working properly. You are scheduled for a device check from home on 01/30/2020. You may send your transmission at any time that day. If you have a wireless device, the transmission will be sent automatically. After your physician reviews your transmission, you will receive a postcard with your next transmission date.  At [facility], you and your health needs are our priority.  As part of our continuing mission to provide you with exceptional heart care, we have created designated Provider Care Teams.  These Care Teams include your primary Cardiologist (physician) and Advanced Practice Providers (APPs -  Physician Assistants and Nurse Practitioners) who all work together to provide you with the care you need, when you need it.  You will need a follow up appointment in 9 months.  Please call our office 2 months in advance to schedule this appointment.  You may see Dr Curt Bears or one of the following Advanced Practice Providers on your designated Care Team:    Chanetta Marshall, NP  Tommye Standard, PA-C  Oda Kilts,  [place]  Thank you for choosing [facility]!!    Trinidad Curet, RN [phone]  Any Other Special Instructions Will Be Listed Below (If Applicable).

## 2019-11-02 NOTE — Progress Notes (Signed)
Electrophysiology Office Note   Date:  11/02/2019   ID:  Jennifer [name], DOB 27-Mar-1956, MRN [id number]  PCP:  Monico Blitz, MD  Cardiologist: [place] Primary Electrophysiologist:  Mahamed Zalewski Meredith Leeds, MD    Chief Complaint: CHF   History of Present Illness: Jennifer [name] is a 64 y.o. female who is being seen today for the evaluation of CHF at the request of Monico Blitz, MD. Presenting today for electrophysiology evaluation.  She has a history of DVT/PE, fibromyalgia, and a chronic systolic heart failure.  Left heart catheterization 09/21/2018 showed a 90% proximal LAD and a 50% circumflex stenosis.  She underwent CABG x2 09/22/2018.  Today, denies symptoms of palpitations, chest pain, shortness of breath, orthopnea, PND, lower extremity edema, claudication, dizziness, presyncope, syncope, bleeding, or neurologic sequela. The patient is tolerating medications without difficulties.  Overall she is doing well.  She is able to do all of her daily activities without restriction.  She has not had any issues since her ICD was implanted.  Past Medical History:  Diagnosis Date  . Arthritis    "probably; maybe in my hands" (09/21/2018)  . Congestive heart failure (CHF) ([place])   . Dyspnea   . Fibromyalgia   . GERD (gastroesophageal reflux disease)   . Hypercholesteremia   . Hypertension   . IBS (irritable bowel syndrome)   . Mild sleep apnea    no CPAP (09/21/2018)  . Myocardial infarction [facility])    "was told I have had one; never knew about it" (09/21/2018)  . Nonischemic cardiomyopathy (HCC)    ECHO EF 40-45%, Septal Thickness,and mild global left ventricular dysfunction; stress test 03/2004 +VE, refersible defect; ECHO 03/2005 @[facility]  EF 55%  . Pulmonary embolus [facility])    Past Surgical History:  Procedure Laterality Date  . ANKLE FRACTURE SURGERY Left 2000  . BUNIONECTOMY Right 1990s?   "& removed part of my great toe due to fungus under nail"  . CARDIAC CATHETERIZATION  03/2005  .  CORONARY ARTERY BYPASS GRAFT N/A 09/22/2018   Procedure: CORONARY ARTERY BYPASS GRAFTING (CABG), ON PUMP, TIMES TWO, USING LEFT INTERNAL MAMMARY ARTERY AND ENDOSCOPICALLY HARVESTED RIGHT GREATER SAPHENOUS VEIN;  Surgeon: Melrose Nakayama, MD;  Location: [place];  Service: Open Heart Surgery;  Laterality: N/A;  . DILATION AND CURETTAGE OF UTERUS    . FRACTURE SURGERY    . ICD IMPLANT N/A 07/25/2019   Procedure: ICD IMPLANT;  Surgeon: Constance Haw, MD;  Location: [place] CV LAB;  Service: Cardiovascular;  Laterality: N/A;  . RIGHT HEART CATH N/A 12/05/2018   Procedure: RIGHT HEART CATH;  Surgeon: Jolaine Artist, MD;  Location: [place] CV LAB;  Service: Cardiovascular;  Laterality: N/A;  . RIGHT/LEFT HEART CATH AND CORONARY ANGIOGRAPHY N/A 09/21/2018   Procedure: RIGHT/LEFT HEART CATH AND CORONARY ANGIOGRAPHY;  Surgeon: [place] Sine, MD;  Location: [place] CV LAB;  Service: Cardiovascular;  Laterality: N/A;  . TEE WITHOUT CARDIOVERSION N/A 09/22/2018   Procedure: TRANSESOPHAGEAL ECHOCARDIOGRAM (TEE);  Surgeon: Melrose Nakayama, MD;  Location: [place];  Service: Open Heart Surgery;  Laterality: N/A;  . TUBAL LIGATION       Current Outpatient Medications  Medication Sig Dispense Refill  . Ascorbic Acid (VITAMIN C) 1000 MG tablet Take 1,000 mg by mouth daily.    Marland Kitchen aspirin EC 81 MG EC tablet Take 1 tablet (81 mg total) by mouth every evening. 30 tablet 0  . atorvastatin (LIPITOR) 80 MG tablet Take 1 tablet (80 mg  total) by mouth daily at 6 PM. 30 tablet 1  . calamine lotion Apply 1 application topically as needed for itching.    . carvedilol (COREG) 6.25 MG tablet Take 1.5 tablets (9.375 mg total) by mouth 2 (two) times daily. 90 tablet 11  . cholecalciferol (VITAMIN D3) 25 MCG (1000 UT) tablet Take 1,000 Units by mouth daily.    . dapagliflozin propanediol (FARXIGA) 10 MG TABS tablet Take 10 mg by mouth daily before breakfast. 30 tablet 6  . digoxin (LANOXIN) 0.125 MG  tablet Take 1 tablet (0.125 mg total) by mouth daily. 90 tablet 3  . DULoxetine (CYMBALTA) 60 MG capsule Take 60 mg by mouth 2 (two) times daily.    . furosemide (LASIX) 40 MG tablet Take 1 tablet (40 mg total) by mouth every other day. May take 1 additional tab as needed for weight gain. (Patient taking differently: Take 40 mg by mouth See admin instructions. Take 40 mg by mouth every other day. May take an additional 40 mg dose as needed for weight gain.) 120 tablet 3  . gabapentin (NEURONTIN) 300 MG capsule Take 600 mg by mouth 3 (three) times daily.    Marland Kitchen Phenylephrine-APAP-guaiFENesin (MUCINEX SINUS-MAX PO) Take 2 tablets by mouth daily as needed (sinus headaches).    . pramipexole (MIRAPEX) 0.5 MG tablet Take 0.5 mg by mouth at bedtime.     . rivaroxaban (XARELTO) 20 MG TABS tablet Take 1 tablet (20 mg total) by mouth daily with breakfast. 30 tablet 6  . sacubitril-valsartan (ENTRESTO) 97-103 MG Take 1 tablet by mouth 2 (two) times daily. 180 tablet 3  . spironolactone (ALDACTONE) 25 MG tablet Take 1 tablet (25 mg total) by mouth daily. 90 tablet 3  . vitamin B-12 (CYANOCOBALAMIN) 500 MCG tablet Take 1 tablet (500 mcg total) by mouth daily. 30 tablet 0   No current facility-administered medications for this visit.    Allergies:   Poison oak extract   Social History:  The patient  reports that she quit smoking about 29 years ago. Her smoking use included cigarettes. She has a 30.00 pack-year smoking history. She has never used smokeless tobacco. She reports previous alcohol use. She reports that she does not use drugs.   Family History:  The patient's family history includes Alcohol abuse in her father; Colon cancer in her father; Congestive Heart Failure (age of onset: 2) in her brother; Depression in her father; Diabetes Mellitus II in her mother; Heart disease (age of onset: 35) in her mother.    ROS:  Please see the history of present illness.   Otherwise, review of systems is positive  for none.   All other systems are reviewed and negative.   PHYSICAL EXAM: VS:  BP 132/66   Pulse 88   Ht 5\' 2"  (1.575 m)   Wt 201 lb (91.2 kg)   BMI 36.76 kg/m  , BMI Body mass index is 36.76 kg/m. GEN: Well nourished, well developed, in no acute distress  HEENT: normal  Neck: no JVD, carotid bruits, or masses Cardiac: RRR; no murmurs, rubs, or gallops,no edema  Respiratory:  clear to auscultation bilaterally, normal work of breathing GI: soft, nontender, nondistended, + BS MS: no deformity or atrophy  Skin: warm and dry, device site well healed Neuro:  Strength and sensation are intact Psych: euthymic mood, full affect  EKG:  EKG is ordered today. Personal review of the ekg ordered shows SR, rate 88, PVC  Personal review of the device interrogation today.  Results in [place]: 11/27/2018: ALT 71 12/07/2018: TSH 2.343 04/11/2019: Magnesium 2.2 07/17/2019: Hemoglobin 13.6; Platelets 263 08/28/2019: B Natriuretic Peptide 148.5; BUN 17; Creatinine, Ser 0.84; Potassium 4.9; Sodium 138    Lipid Panel  No results found for: CHOL, TRIG, HDL, CHOLHDL, VLDL, LDLCALC, LDLDIRECT   Wt Readings from Last 3 Encounters:  11/02/19 201 lb (91.2 kg)  08/28/19 196 lb (88.9 kg)  07/25/19 190 lb (86.2 kg)      Other studies Reviewed: Additional studies/ records that were reviewed today include: TTE 06/26/19  Review of the above records today demonstrates:   1. The left ventricle has severely reduced systolic function, with an ejection fraction of 20-25%. The cavity size was moderately dilated. Left ventricular diastolic Doppler parameters are consistent with restrictive filling. Left ventrical global  hypokinesis without regional wall motion abnormalities.  2. The right ventricle has normal systolic function. The cavity was normal. There is no increase in right ventricular wall thickness.  3. Left atrial size was severely dilated.  4. Mild thickening of the mitral valve leaflet.  Mitral valve regurgitation is severe by color flow Doppler.  5. The aortic valve is tricuspid. Mild calcification of the aortic valve. No stenosis of the aortic valve.  6. The aorta is normal unless otherwise noted.  Cardiac monitor personally reviewed  Predominant rhythm sinus rhythm Average heart rate 65 22 VT runs Longest and fastest interval 12 beats with a heart rate of 179 bpm 2.8% PVCs, rare PACs  ASSESSMENT AND PLAN:  1.  Chronic systolic heart failure due to ischemic and nonischemic cardiomyopathy: Currently on optimal medical therapy with Entresto, Lasix, carvedilol, Aldactone.  Status post [place] ICD implanted 07/25/2019.  Device functioning appropriately.  It is elevated today.  Urania Pearlman increase carvedilol to 12.5 mg.  2.  Coronary artery disease status post CABG: No current chest pain.  Continue statin and Xarelto.  3.  PVCs: Doing well on carvedilol 2.8% on monitor.  No changes.  4.  Obstructive sleep apnea: CPAP compliance encouraged  Current medicines are reviewed at length with the patient today.   The patient does not have concerns regarding her medicines.  The following changes were made today: Increase carvedilol  Labs/ tests ordered today include:  Orders Placed This Encounter  Procedures  . HEART 12-Lead    Disposition:   FU with Rayford Williamsen 9 months  Signed, Calhoun Reichardt Meredith Leeds, MD  11/02/2019 2:41 PM     [place] [address] [place] [address] [phone] (office) [phone] (fax)

## 2019-11-09 DIAGNOSIS — R1030 Lower abdominal pain, unspecified: Secondary | ICD-10-CM | POA: Diagnosis not present

## 2019-11-09 DIAGNOSIS — Z6836 Body mass index (BMI) 36.0-36.9, adult: Secondary | ICD-10-CM | POA: Diagnosis not present

## 2019-11-09 DIAGNOSIS — I700 Atherosclerosis of aorta: Secondary | ICD-10-CM | POA: Diagnosis not present

## 2019-11-09 DIAGNOSIS — I5022 Chronic systolic (congestive) heart failure: Secondary | ICD-10-CM | POA: Diagnosis not present

## 2019-11-09 DIAGNOSIS — K5730 Diverticulosis of large intestine without perforation or abscess without bleeding: Secondary | ICD-10-CM | POA: Diagnosis not present

## 2019-11-09 DIAGNOSIS — R109 Unspecified abdominal pain: Secondary | ICD-10-CM | POA: Diagnosis not present

## 2019-11-09 DIAGNOSIS — Z299 Encounter for prophylactic measures, unspecified: Secondary | ICD-10-CM | POA: Diagnosis not present

## 2019-11-09 DIAGNOSIS — I517 Cardiomegaly: Secondary | ICD-10-CM | POA: Diagnosis not present

## 2019-11-09 DIAGNOSIS — R1031 Right lower quadrant pain: Secondary | ICD-10-CM | POA: Diagnosis not present

## 2019-11-09 DIAGNOSIS — D6869 Other thrombophilia: Secondary | ICD-10-CM | POA: Diagnosis not present

## 2019-11-09 DIAGNOSIS — I10 Essential (primary) hypertension: Secondary | ICD-10-CM | POA: Diagnosis not present

## 2019-11-09 DIAGNOSIS — Z950 Presence of cardiac pacemaker: Secondary | ICD-10-CM | POA: Diagnosis not present

## 2019-11-26 DIAGNOSIS — Z1231 Encounter for screening mammogram for malignant neoplasm of breast: Secondary | ICD-10-CM | POA: Diagnosis not present

## 2019-12-01 IMAGING — MR MR CARD MORPHOLOGY WO/W CM
15 of 17 series · 38 of 40 positions shown · IV contrast (Gadavist)
Comparison: none

CLINICAL DATA: Cardiomyopathy of uncertain etiology

EXAM:
CARDIAC MRI
TECHNIQUE: The patient was scanned on a 1.5 Tesla GE magnet. A dedicated
cardiac coil was used. Functional imaging was done using Fiesta
sequences. [DATE], and 4 chamber views were done to assess for RWMA's.
Modified Perrie rule using a short axis stack was used to
calculate an ejection fraction on a dedicated work station using
Circle software. The patient received 8 cc of Multihance. After 10
minutes inversion recovery sequences were used to assess for
infiltration and scar tissue.

[Series 6: bSSFP · oblique · 8.0mm · 1.61mm/px · 24 of 375 slices shown (1 of 4)]
[im 1/375]
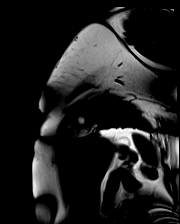
[im 17/375]
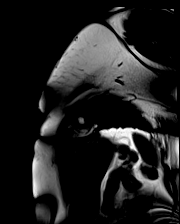
[im 33/375]
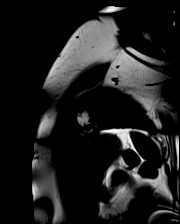
[im 49/375]
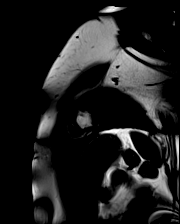
[im 66/375]
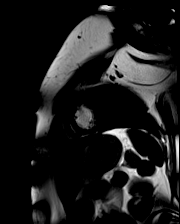
[im 82/375]
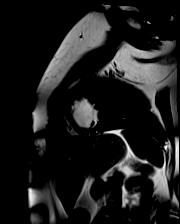
[im 98/375]
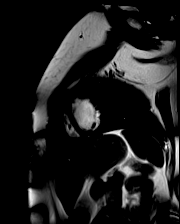
[im 114/375]
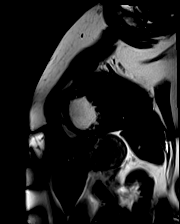
[im 131/375]
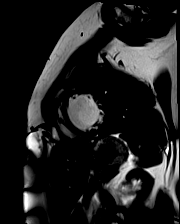
[im 147/375]
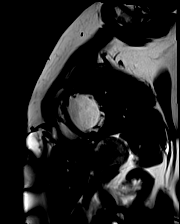
[im 163/375]
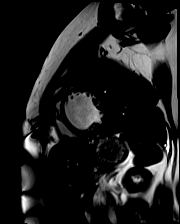
[im 179/375]
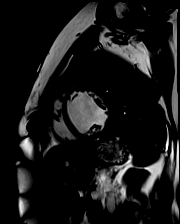
[im 196/375]
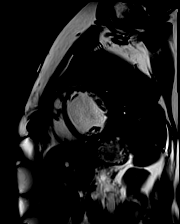
[im 212/375]
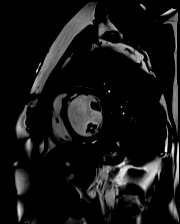
[im 228/375]
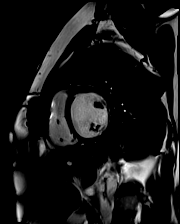
[im 244/375]
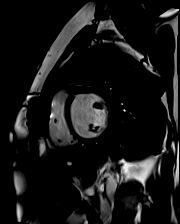
[im 261/375]
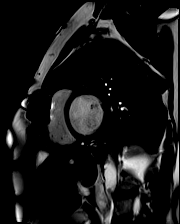
[im 277/375]
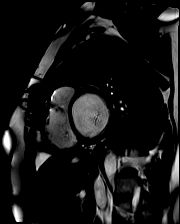
[im 293/375]
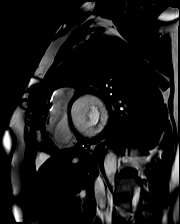
[im 309/375]
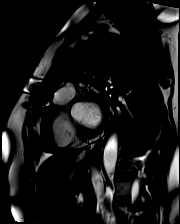
[im 326/375]
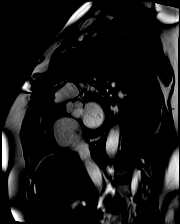
[im 342/375]
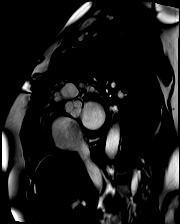
[im 358/375]
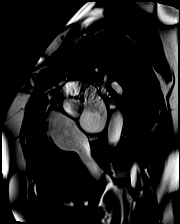
[im 375/375]
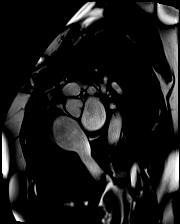

[Series 7: t2_stir_db_sax · oblique · 8.0mm · 1.73mm/px · 1 of 13 slices shown]
[im 1/13]
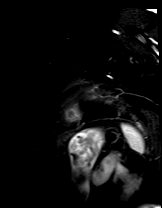

[Series 8: bSSFP · axial · 6.5mm · 1.41mm/px · 1 of 25 slices shown (2 of 4)]
[im 1/25]
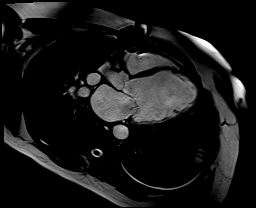

[Series 9: bSSFP · oblique · 6.5mm · 1.41mm/px · 1 of 25 slices shown (3 of 4)]
[im 1/25]
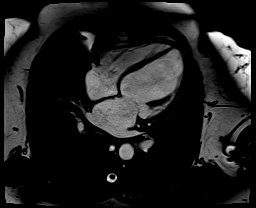

[Series 10: bSSFP · coronal · 6.5mm · 1.41mm/px · 1 of 25 slices shown (4 of 4)]
[im 1/25]
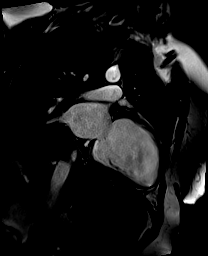

[Series 12: lge_single shot sa · oblique · 8.0mm · 1.77mm/px · 1 of 14 slices shown (1 of 4)]
[im 1/14]
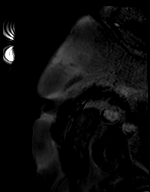

[Series 13: lge_single shot sa · oblique · 8.0mm · 1.77mm/px · 1 of 14 slices shown (2 of 4)]
[im 1/14]
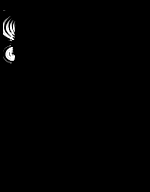

[Series 14: lge_single shot radial_mag · axial · 6.0mm · 1.98mm/px · 1 of 1 slices shown]
[im 1/1]
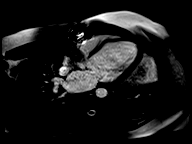

[Series 15: lge_single shot radial_psir · axial · 6.0mm · 1.98mm/px · 1 of 1 slices shown]
[im 1/1]
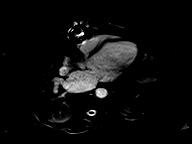

[Series 21: lge short axis_mag · oblique · 8.0mm · 1.52mm/px · 1 of 13 slices shown]
[im 1/13]
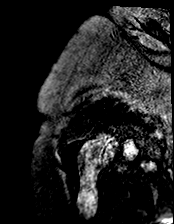

[Series 22: lge short axis_psir · oblique · 8.0mm · 1.52mm/px · 1 of 13 slices shown]
[im 1/13]
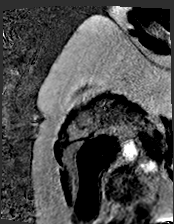

[Series 23: lge radial ((date)ch)_mag · axial · 6.0mm · 1.61mm/px · 1 of 1 slices shown]
[im 1/1]
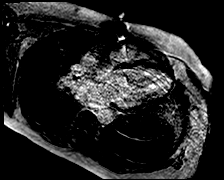

[Series 24: lge radial ((date)ch)_psir · axial · 6.0mm · 1.61mm/px · 1 of 1 slices shown]
[im 1/1]
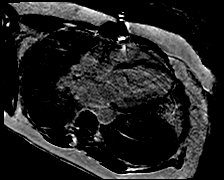

[Series 29: lge_single shot sa · oblique · 8.0mm · 1.77mm/px · 1 of 14 slices shown (3 of 4)]
[im 1/14]
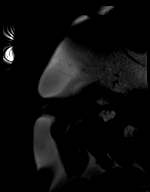

[Series 30: lge_single shot sa · oblique · 8.0mm · 1.77mm/px · 1 of 14 slices shown (4 of 4)]
[im 1/14]
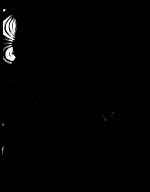

[38 of 40 positions shown; findings below may reference images not displayed]

FINDINGS: The patient is status post sternotomy. Limited images of the lung
fields showed no gross abnormalities.

Moderate left ventricular dilation with normal wall thickness.
Diffuse hypokinesis, EF 21%. No LV thrombus noted. Normal right
ventricular size with moderately decreased systolic function, EF
31%. Mild left atrial enlargement. Normal right atrial size.
Trileaflet aortic valve, no significant stenosis or regurgitation.
Moderate tricuspid regurgitation. At least moderate central mitral
regurgitation, suspect functional.

On delayed enhancement imaging, there was no myocardial late
gadolinium enhancement (LGE).

Measurements:

LVEDV 295 mL

LVSV 61 mL

LVEF 21%

RVEDV 168 mL

RVSV 53 mL

RVEF 31%
IMPRESSION: 1.  Moderately dilated LV with EF 21%, diffuse hypokinesis.

2. Normal RV size with moderately decreased systolic function, EF
31%.

3.  Moderate TR, at least moderate central MR (likely functional).

4. No myocardial LGE, so no evidence for prior infarct, infiltrative
disease, or myocarditis.

Mishka Quincy

## 2019-12-13 ENCOUNTER — Inpatient Hospital Stay: Payer: PRIVATE HEALTH INSURANCE

## 2019-12-13 DIAGNOSIS — Z0000 Encounter for general adult medical examination without abnormal findings: Secondary | ICD-10-CM

## 2019-12-25 ENCOUNTER — Telehealth (HOSPITAL_COMMUNITY): Payer: Self-pay

## 2019-12-25 NOTE — Telephone Encounter (Signed)

## 2019-12-26 ENCOUNTER — Ambulatory Visit (HOSPITAL_BASED_OUTPATIENT_CLINIC_OR_DEPARTMENT_OTHER)
Admission: RE | Admit: 2019-12-26 | Discharge: 2019-12-26 | Disposition: A | Discharge status: Home / Self Care | Payer: Medicare PPO | Source: Ambulatory Visit | Attending: Internal Medicine | Admitting: Internal Medicine

## 2019-12-26 ENCOUNTER — Encounter (HOSPITAL_COMMUNITY): Payer: Self-pay | Admitting: Internal Medicine

## 2019-12-26 ENCOUNTER — Ambulatory Visit (INDEPENDENT_AMBULATORY_CARE_PROVIDER_SITE_OTHER): Payer: Medicare PPO | Admitting: *Deleted

## 2019-12-26 ENCOUNTER — Other Ambulatory Visit: Payer: Self-pay

## 2019-12-26 ENCOUNTER — Ambulatory Visit (HOSPITAL_COMMUNITY)
Admission: RE | Admit: 2019-12-26 | Discharge: 2019-12-26 | Disposition: A | Discharge status: Home / Self Care | Payer: Medicare PPO | Source: Ambulatory Visit | Attending: Internal Medicine | Admitting: Internal Medicine

## 2019-12-26 VITALS — BP 110/70 | HR 76 | Wt 207.8 lb

## 2019-12-26 DIAGNOSIS — Z7901 Long term (current) use of anticoagulants: Secondary | ICD-10-CM | POA: Diagnosis not present

## 2019-12-26 DIAGNOSIS — Z951 Presence of aortocoronary bypass graft: Secondary | ICD-10-CM | POA: Insufficient documentation

## 2019-12-26 DIAGNOSIS — N179 Acute kidney failure, unspecified: Secondary | ICD-10-CM | POA: Diagnosis not present

## 2019-12-26 DIAGNOSIS — I493 Ventricular premature depolarization: Secondary | ICD-10-CM

## 2019-12-26 DIAGNOSIS — I2510 Atherosclerotic heart disease of native coronary artery without angina pectoris: Secondary | ICD-10-CM | POA: Insufficient documentation

## 2019-12-26 DIAGNOSIS — I252 Old myocardial infarction: Secondary | ICD-10-CM | POA: Insufficient documentation

## 2019-12-26 DIAGNOSIS — M797 Fibromyalgia: Secondary | ICD-10-CM | POA: Diagnosis not present

## 2019-12-26 DIAGNOSIS — Z87891 Personal history of nicotine dependence: Secondary | ICD-10-CM | POA: Insufficient documentation

## 2019-12-26 DIAGNOSIS — Z79899 Other long term (current) drug therapy: Secondary | ICD-10-CM | POA: Insufficient documentation

## 2019-12-26 DIAGNOSIS — G4733 Obstructive sleep apnea (adult) (pediatric): Secondary | ICD-10-CM

## 2019-12-26 DIAGNOSIS — I255 Ischemic cardiomyopathy: Secondary | ICD-10-CM | POA: Diagnosis not present

## 2019-12-26 DIAGNOSIS — E785 Hyperlipidemia, unspecified: Secondary | ICD-10-CM | POA: Insufficient documentation

## 2019-12-26 DIAGNOSIS — Z86711 Personal history of pulmonary embolism: Secondary | ICD-10-CM | POA: Insufficient documentation

## 2019-12-26 DIAGNOSIS — I5042 Chronic combined systolic (congestive) and diastolic (congestive) heart failure: Secondary | ICD-10-CM

## 2019-12-26 DIAGNOSIS — I2699 Other pulmonary embolism without acute cor pulmonale: Secondary | ICD-10-CM

## 2019-12-26 DIAGNOSIS — R0602 Shortness of breath: Secondary | ICD-10-CM | POA: Diagnosis not present

## 2019-12-26 DIAGNOSIS — Z8249 Family history of ischemic heart disease and other diseases of the circulatory system: Secondary | ICD-10-CM | POA: Insufficient documentation

## 2019-12-26 DIAGNOSIS — Z86718 Personal history of other venous thrombosis and embolism: Secondary | ICD-10-CM | POA: Diagnosis not present

## 2019-12-26 DIAGNOSIS — I5022 Chronic systolic (congestive) heart failure: Secondary | ICD-10-CM | POA: Insufficient documentation

## 2019-12-26 DIAGNOSIS — I110 Hypertensive heart disease with heart failure: Secondary | ICD-10-CM | POA: Insufficient documentation

## 2019-12-26 DIAGNOSIS — Z7982 Long term (current) use of aspirin: Secondary | ICD-10-CM | POA: Insufficient documentation

## 2019-12-26 DIAGNOSIS — M1990 Unspecified osteoarthritis, unspecified site: Secondary | ICD-10-CM | POA: Diagnosis not present

## 2019-12-26 DIAGNOSIS — Z7984 Long term (current) use of oral hypoglycemic drugs: Secondary | ICD-10-CM | POA: Diagnosis not present

## 2019-12-26 DIAGNOSIS — F329 Major depressive disorder, single episode, unspecified: Secondary | ICD-10-CM | POA: Insufficient documentation

## 2019-12-26 DIAGNOSIS — E7800 Pure hypercholesterolemia, unspecified: Secondary | ICD-10-CM | POA: Diagnosis not present

## 2019-12-26 LAB — CBC
HCT: 43.8 % (ref 36.0–46.0)
Hemoglobin: 14.4 g/dL (ref 12.0–15.0)
MCH: 33 pg (ref 26.0–34.0)
MCHC: 32.9 g/dL (ref 30.0–36.0)
MCV: 100.2 fL — ABNORMAL HIGH (ref 80.0–100.0)
Platelets: 244 10*3/uL (ref 150–400)
RBC: 4.37 MIL/uL (ref 3.87–5.11)
RDW: 11.9 % (ref 11.5–15.5)
WBC: 6.6 10*3/uL (ref 4.0–10.5)
nRBC: 0 % (ref 0.0–0.2)

## 2019-12-26 LAB — CUP PACEART REMOTE DEVICE CHECK
Battery Remaining Longevity: 92 mo
Battery Remaining Percentage: 94 %
Battery Voltage: 3.16 V
Brady Statistic AP VP Percent: 1 %
Brady Statistic AP VS Percent: 1 %
Brady Statistic AS VP Percent: 1 %
Brady Statistic AS VS Percent: 99 %
Brady Statistic RA Percent Paced: 1 %
Brady Statistic RV Percent Paced: 1 %
Date Time Interrogation Session: 20210310135343
HighPow Impedance: 91 Ohm
HighPow Impedance: 91 Ohm
Implantable Lead Implant Date: 20201007
Implantable Lead Implant Date: 20201007
Implantable Lead Location: 753859
Implantable Lead Location: 753860
Implantable Lead Model: 7122
Implantable Pulse Generator Implant Date: 20201007
Lead Channel Impedance Value: 440 Ohm
Lead Channel Impedance Value: 710 Ohm
Lead Channel Pacing Threshold Amplitude: 0.75 V
Lead Channel Pacing Threshold Amplitude: 0.75 V
Lead Channel Pacing Threshold Pulse Width: 0.5 ms
Lead Channel Pacing Threshold Pulse Width: 0.5 ms
Lead Channel Sensing Intrinsic Amplitude: 12 mV
Lead Channel Sensing Intrinsic Amplitude: 4 mV
Lead Channel Setting Pacing Amplitude: 2 V
Lead Channel Setting Pacing Amplitude: 2.5 V
Lead Channel Setting Pacing Pulse Width: 0.5 ms
Lead Channel Setting Sensing Sensitivity: 0.5 mV
Pulse Gen Serial Number: 9901215

## 2019-12-26 LAB — BASIC METABOLIC PANEL
Anion gap: 11 (ref 5–15)
BUN: 15 mg/dL (ref 8–23)
CO2: 25 mmol/L (ref 22–32)
Calcium: 9.1 mg/dL (ref 8.9–10.3)
Chloride: 105 mmol/L (ref 98–111)
Creatinine, Ser: 0.84 mg/dL (ref 0.44–1.00)
GFR calc Af Amer: >60 mL/min (ref >60)
GFR calc non Af Amer: >60 mL/min (ref >60)
Glucose, Bld: 105 mg/dL — ABNORMAL HIGH (ref 70–99)
Potassium: 4.4 mmol/L (ref 3.5–5.1)
Sodium: 141 mmol/L (ref 135–145)

## 2019-12-26 LAB — BRAIN NATRIURETIC PEPTIDE: B Natriuretic Peptide: 194.8 pg/mL — ABNORMAL HIGH (ref 0.0–100.0)

## 2019-12-26 LAB — DIGOXIN LEVEL: Digoxin Level: 0.3 ng/mL — ABNORMAL LOW (ref 0.8–2.0)

## 2019-12-26 MED ORDER — RIVAROXABAN 10 MG PO TABS: 10.0000 mg | ORAL_TABLET | Freq: Every day | ORAL | 3 refills | Status: DC

## 2019-12-26 NOTE — Patient Instructions (Signed)
Decrease Xarelto to 10 mg daily  Labs done today, your results will be available in MyChart, we will contact you for abnormal readings.  Your physician recommends that you schedule a follow-up appointment in: 3-4 months  If you have any questions or concerns before your next appointment please send [place] a message through [place] or call our office at [phone].  At the [facility], you and your health needs are our priority. As part of our continuing mission to provide you with exceptional heart care, we have created designated Provider Care Teams. These Care Teams include your primary Cardiologist (physician) and Advanced Practice Providers (APPs- Physician Assistants and Nurse Practitioners) who all work together to provide you with the care you need, when you need it.   You may see any of the following providers on your designated Care Team at your next follow up: Marland Kitchen Dr Glori Bickers . Dr Loralie Champagne . Darrick Grinder, NP . Lyda Jester, PA . Audry Riles, PharmD   Please be sure to bring in all your medications bottles to every appointment.

## 2019-12-26 NOTE — Addendum Note (Signed)
Encounter addended by: Scarlette Calico, RN on: 12/26/2019 3:41 PM  Actions taken: Order list changed

## 2019-12-26 NOTE — Progress Notes (Signed)
  Echocardiogram 2D Echocardiogram has been performed.  Jennifer [name] A [name] [name] 12/26/2019, 1:45 PM

## 2019-12-26 NOTE — Progress Notes (Signed)
Advanced Heart Failure Clinic Note  Date:  12/26/2019   ID:  Jennifer [name], DOB 06/16/1956, MRN [id number]  Location: Home  Provider location: [place] [facility] Type of Visit: Established patient   PCP:  Monico Blitz, MD  Cardiologist:  Kate Sable, MD Primary HF: Dr Haroldine Laws  EP: Dr Curt Bears  Chief Complaint: Heart Failure    History of Present Illness:  Jennifer [name] is a 64 y.o. female with a history of systolic HF due to mixed ICM/NICM, DVT, PE, fibromyalgia.   Long h/o reported mild NICM EF 40-45%. Echo 11/19 EF 15-20%  Catheterization done12/05/19revealed 90% proximal LAD and a 50% circumflex stenosis. She underwent bypass grafting x2 with an L IMA to the LAD and an SVG to the OM on 09/22/18 with Dr Roxan Hockey.  Post CABG EF remained depressed.   -cMRI 2/20 LVEF 21% Moderately reduced RV, moderateTR. No LGE - ECHO 02/2019 EF 20-25% RV severerly reduced   Zio Patch 5/20 7.3 % PVCs 1. Predominant underlying rhythm was Sinus Rhythm. Minimum HR of 56 bpm, max HR of 176 bpm, and avg HR of 77 bpm. 2. Three runs of NSVT - longest 10 beats 3. Seven runs of SVT - longest lasting 9 beats   Started carvedilol 03/13/2019    Underwent SJ ICD 07/25/19  Echo 9/20 EF 20-25%. RV ok. Personally reviewed  Here for routine f/u. Feels really good. Still working in the barn.Gets SOB at times with stairs. No LE edema, orthopnea or PND. Taking lasix 40 mg qod. No low BP. No dizziness.   Echo today 12/26/19 EF 20-25% RV normal. Personally reviewed   CPX 07/09/19 FVC 2.09 (73%)    FEV1 1.47 (65%)     FEV1/FVC 70 (88%)     MVV 70 (81%)     Resting HR: 52 Standing HR:48 Peak HR: 102  (65% age predicted max HR)  BP rest: 84/60 Standing BP:90/60 BP peak: 124/60  Peak VO2: 16.0 (89% predicted peak VO2)  VE/VCO2 slope: 29  OUES: 1.81  Peak RER: 1.05  VE/MVV: 67%   Cardiac studies:  Echo  02/19/2019 EF 20-25% RV severely reduced  11/28/2018 EF  10-15% with moderate RV dysfunction.   cMRI 12/05/18 1. Moderately dilated LV with EF 21%, diffuse hypokinesis. 2. Normal RV size with moderately decreased systolic function, EF 123456. 3. Moderate TR, at least moderate central MR (likely functional). 4. No myocardial LGE, so no evidence for prior infarct, infiltrative disease, or myocarditis.  RHC 12/05/18 On milrinone 0.125 RA = 8 RV = 37/10 PA = 39/15 (25) PCW = 19 (v = 35) Fick cardiac output/index = 5.0/2.8 PVR = 1.1 WU Ao sat = 94% PA sat = 66%, 66% PaPi = 3.0 RA/PCW = 0.42  Past Medical History:  Diagnosis Date  . Arthritis    "probably; maybe in my hands" (09/21/2018)  . Congestive heart failure (CHF) ([facility])   . Dyspnea   . Fibromyalgia   . GERD (gastroesophageal reflux disease)   . Hypercholesteremia   . Hypertension   . IBS (irritable bowel syndrome)   . Mild sleep apnea    no CPAP (09/21/2018)  . Myocardial infarction [facility])    "was told I have had one; never knew about it" (09/21/2018)  . Nonischemic cardiomyopathy (HCC)    ECHO EF 40-45%, Septal Thickness,and mild global left ventricular dysfunction; stress test 03/2004 +VE, refersible defect; ECHO 03/2005 @[facility]  EF 55%  . Pulmonary embolus (H[facility])    Past  Surgical History:  Procedure Laterality Date  . ANKLE FRACTURE SURGERY Left 2000  . BUNIONECTOMY Right 1990s?   "& removed part of my great toe due to fungus under nail"  . CARDIAC CATHETERIZATION  03/2005  . CORONARY ARTERY BYPASS GRAFT N/A 09/22/2018   Procedure: CORONARY ARTERY BYPASS GRAFTING (CABG), ON PUMP, TIMES TWO, USING LEFT INTERNAL MAMMARY ARTERY AND ENDOSCOPICALLY HARVESTED RIGHT GREATER SAPHENOUS VEIN;  Surgeon: Melrose Nakayama, MD;  Location: [facility];  Service: Open Heart Surgery;  Laterality: N/A;  . DILATION AND CURETTAGE OF UTERUS    . FRACTURE SURGERY    . ICD IMPLANT N/A 07/25/2019   Procedure: ICD IMPLANT;  Surgeon: Constance Haw, MD;  Location: [facility];  Service:  Cardiovascular;  Laterality: N/A;  . RIGHT HEART CATH N/A 12/05/2018   Procedure: RIGHT HEART CATH;  Surgeon: Jolaine Artist, MD;  Location: [facility];  Service: Cardiovascular;  Laterality: N/A;  . RIGHT/LEFT HEART CATH AND CORONARY ANGIOGRAPHY N/A 09/21/2018   Procedure: RIGHT/LEFT HEART CATH AND CORONARY ANGIOGRAPHY;  Surgeon: Troy Sine, MD;  Location: [facility];  Service: Cardiovascular;  Laterality: N/A;  . TEE WITHOUT CARDIOVERSION N/A 09/22/2018   Procedure: TRANSESOPHAGEAL ECHOCARDIOGRAM (TEE);  Surgeon: Melrose Nakayama, MD;  Location: [facility];  Service: Open Heart Surgery;  Laterality: N/A;  . TUBAL LIGATION       Current Outpatient Medications  Medication Sig Dispense Refill  . Ascorbic Acid (VITAMIN C) 1000 MG tablet Take 1,000 mg by mouth daily.    Marland Kitchen aspirin EC 81 MG EC tablet Take 1 tablet (81 mg total) by mouth every evening. 30 tablet 0  . atorvastatin (LIPITOR) 80 MG tablet Take 1 tablet (80 mg total) by mouth daily at 6 PM. 30 tablet 1  . calamine lotion Apply 1 application topically as needed for itching.    . carvedilol (COREG) 12.5 MG tablet Take 1 tablet (12.5 mg total) by mouth 2 (two) times daily. 180 tablet 3  . cholecalciferol (VITAMIN D3) 25 MCG (1000 UT) tablet Take 1,000 Units by mouth daily.    . dapagliflozin propanediol (FARXIGA) 10 MG TABS tablet Take 10 mg by mouth daily before breakfast. 30 tablet 6  . digoxin (LANOXIN) 0.125 MG tablet Take 1 tablet (0.125 mg total) by mouth daily. 90 tablet 3  . DULoxetine (CYMBALTA) 60 MG capsule Take 60 mg by mouth 2 (two) times daily.    . furosemide (LASIX) 40 MG tablet Take 40 mg by mouth every other day. May take an additional tablet as needed for weight gain    . gabapentin (NEURONTIN) 300 MG capsule Take 600 mg by mouth 3 (three) times daily.    Marland Kitchen Phenylephrine-APAP-guaiFENesin (MUCINEX SINUS-MAX PO) Take 2 tablets by mouth daily as needed (sinus headaches).    . pramipexole (MIRAPEX)  0.5 MG tablet Take 0.5 mg by mouth at bedtime.     . rivaroxaban (XARELTO) 20 MG TABS tablet Take 1 tablet (20 mg total) by mouth daily with breakfast. 30 tablet 6  . sacubitril-valsartan (ENTRESTO) 97-103 MG Take 1 tablet by mouth 2 (two) times daily. 180 tablet 3  . spironolactone (ALDACTONE) 25 MG tablet Take 1 tablet (25 mg total) by mouth daily. 90 tablet 3  . vitamin B-12 (CYANOCOBALAMIN) 500 MCG tablet Take 1 tablet (500 mcg total) by mouth daily. 30 tablet 0   No current facility-administered medications for this encounter.    Allergies:   Poison oak extract  Social History:  The patient  reports that she quit smoking about 29 years ago. Her smoking use included cigarettes. She has a 30.00 pack-year smoking history. She has never used smokeless tobacco. She reports previous alcohol use. She reports that she does not use drugs.   Family History:  The patient's family history includes Alcohol abuse in her father; Colon cancer in her father; Congestive Heart Failure (age of onset: 65) in her brother; Depression in her father; Diabetes Mellitus II in her mother; Heart disease (age of onset: 21) in her mother.   ROS:  Please see the history of present illness.   All other systems are personally reviewed and negative.  Vitals:   12/26/19 1348  BP: 110/70  Pulse: 76  SpO2: 97%    Exam:   General:  Well appearing. No resp difficulty HEENT: normal Neck: supple. no JVD. Carotids 2+ bilat; no bruits. No lymphadenopathy or thryomegaly appreciated. Cor: PMI nondisplaced. Regular rate & rhythm. No rubs, gallops or murmurs. Lungs: clear Abdomen: soft, nontender, nondistended. No hepatosplenomegaly. No bruits or masses. Good bowel sounds. Extremities: no cyanosis, clubbing, rash, edema Neuro: alert & orientedx3, cranial nerves grossly intact. moves all 4 extremities w/o difficulty. Affect pleasant   Recent Labs: 04/11/2019: Magnesium 2.2 07/17/2019: Hemoglobin 13.6; Platelets  263 08/28/2019: B Natriuretic Peptide 148.5; BUN 17; Creatinine, Ser 0.84; Potassium 4.9; Sodium 138  Personally reviewed   Wt Readings from Last 3 Encounters:  12/26/19 94.3 kg (207 lb 12.8 oz)  11/02/19 91.2 kg (201 lb)  08/28/19 88.9 kg (196 lb)      ASSESSMENT AND PLAN:  1. Biventricular Chronic Systolic HF  Suspect mixed ICM/NICM (?PVCs) - LHC 2019 90% LAD and 50% circumflex --> S/P CABG x2 LIMA to LAD and SVG to OM  - ECHO 11/2018 EF 10-15% moderate RV dysfunction.  - cMRI 11/2018  LVEF 21% Moderately reduced RV, moderateTR moderate to severe MR. No LGE - ECHO 02/2019 EF 20-25% RV severerly reduced - Echo 9/20 EF 20-25% severe MR - Zio Patch 5/20 with 7.3% PVCs.  - s/p ST Jude  ICD - Echo today EF 20-25% RV ok mild MR Personally reviewed - Doing well. NYHA II. Volume status looks good on exam and ICD - Continue coreg 12.5 mg twice a day.   - Continue digoxin 0.125 mg daily.  - Continue entresto 97/103 twice a day  - Continue spiro 25 daily  - Continue Farxiga 10mg  daily - CPX test with preserved functional capacity but low HR. Off ivabradine  - ICD interrogated in clinic. No VT or AF. Volume looks good. PVC burden < 1%  2. H/O bilateral PE 1/20 - Continue xarelto. Will reduce dose to 10 daily based on data from chronic prevention from [facility]  3. CAD S/P CABG 09/2018 - LIMA to the LAD and an SVG to the OM on 12/6/19with Dr Roxan Hockey - No s/s of ischemia - Continue statin and xarelto.  4. Fibromyalgia - stable  5. PVCs - Zio Patch 5/20 with 7.3% PVCs.  - Improved. Will requantify with ICD -> < 1% now  6. Snoring - Home sleep study 7/20. AHI 7.2 with desat down to 85%  7. Moderate to severe MR - likely due to LV dysfunction/dilation. - only mild on echo today     Glori Bickers, MD  12/26/2019 2:13 PM  [facility] [address] [facility] [place] [address] [phone] (office) [phone]  (fax)

## 2019-12-27 NOTE — Progress Notes (Signed)
ICD Remote  

## 2020-01-16 DIAGNOSIS — M545 Low back pain: Secondary | ICD-10-CM | POA: Diagnosis not present

## 2020-01-16 DIAGNOSIS — I255 Ischemic cardiomyopathy: Secondary | ICD-10-CM | POA: Diagnosis not present

## 2020-01-22 ENCOUNTER — Ambulatory Visit: Payer: PRIVATE HEALTH INSURANCE

## 2020-01-22 DIAGNOSIS — E782 Mixed hyperlipidemia: Secondary | ICD-10-CM

## 2020-01-22 DIAGNOSIS — F5101 Primary insomnia: Secondary | ICD-10-CM

## 2020-01-22 NOTE — Progress Notes
PATIENT:  Kerry Baxter   MRN:  [id number]  DOB:  [birth year]  DATE OF SERVICE:  01/22/2020    PRIMARY CARE PROVIDER: Gregary Cromer., MD    CC:   Chief Complaint   Patient presents with   ? Annual Exam        Subjective:     HPI: Kerry Baxter is a 64 y.o. female presenting today for     [facility] MEDICAL WELLNESS VISIT/ REVIEW OF ALL CHRONIC MEDICAL CONDITIONS      No additional contributory past medical, surgical, family or social history except as documented in care connect.   ROS: 14-Review of Systems - negative     Allergies   Allergen Reactions   ? Garlic    ? Sulfa Antibiotics      Rash         Medications:  Outpatient Medications Prior to Visit   Medication Sig   ? fluticasone (FLONASE ALLERGY RELIEF) 50 mcg/act nasal spray      No facility-administered medications prior to visit.          Past Medical History:  Past Medical History:   Diagnosis Date   ? Hyperlipidemia 11/28/2012   ? Insomnia      Patient Active Problem List   Diagnosis   ? Hyperlipidemia   ? Insomnia   ? Routine general medical examination at a health care facility   ? Preventative health care   ? SK (seborrheic keratosis)   ? End of life care         Past Surgical History:  Past Surgical History:   Procedure Laterality Date   ? COLONOSCOPY  01/07/2012    Procedure: COLONOSCOPY;  Surgeon: Joslyn Devon, MD;  Location: [facility];  Service: Gastroenterology;  Laterality: N/A;  scope C#11   ? UPPER GASTROINTESTINAL ENDOSCOPY  01/07/2012    Procedure: UPPER GI ENDOSCOPY;  Surgeon: Joslyn Devon, MD;  Location: [facility];  Service: Gastroenterology;  Laterality: N/A;  Scope E# 6       Social History:  Social History     Socioeconomic History   ? Marital status: Married     Spouse name: Keith Rake   ? Number of children: 1   ? Years of education: masters   ? Highest education level: Not on file   Occupational History   ? Occupation: Scientist, physiological   Social Needs   ? Financial resource strain: Not hard at all   ? Food insecurity     Worry: Not on file     Inability: Not on file   ? Transportation needs     Medical: Not on file     Non-medical: Not on file   Tobacco Use   ? Smoking status: Never Smoker   ? Smokeless tobacco: Never Used   Substance and Sexual Activity   ? Alcohol use: Yes     Alcohol/week: 4.8 - 6.0 oz     Types: 8 - 10 Glasses of Wine (5 oz) per week     Comment: mild   ? Drug use: No   ? Sexual activity: Yes     Partners: Male     Birth control/protection: None   Lifestyle   ? Physical activity     Days per week: 5 days     Minutes per session: 70 min   ? Stress: To some extent   Relationships   ? Social connections     Talks on phone: Not on file  Gets together: Not on file     Attends religious service: Not on file     Active member of club or organization: Not on file     Attends meetings of clubs or organizations: Not on file     Relationship status: Not on file   Other Topics Concern   ? Do you exercise at least a day, 3 or more days a week? Yes   ? Types of Exercise? (List in Comments) Yes     Comment: gym   ? Do you follow a special diet? No   ? Vegan? Not Asked   ? Vegetarian? Not Asked   ? Pescatarian? Not Asked   ? Lactose Free? Not Asked   ? Gluten Free? Not Asked   ? Omnivore? Not Asked   Social History Narrative    Diet; Balanced       Family History:   Family History   Problem Relation Age of Onset   ? Skin cancer Mother    ? Heart disease Mother         VALVE   ? Arthritis Sister    ? Diabetes Other         Family Hx   ? Mental illness Other         Family Hx   ? Asthma Other         Family Hx   ? Allergies Other         Family Hx       Health Maintenance:   Vaccines:   Immunization History   Administered Date(s) Administered   ? COVID-19, mRNA, LNP-S, PF, 100 mcg/0.5 mL Dose (Moderna) 01/10/2020   ? Influenza Whole 06/18/2018   ? Td 10/18/2006   ? Tdap 10/15/2013   ? influenza vaccine IM quadrivalent (Fluarix Quad) (PF) SYR (10 months of age and older) 06/20/2019   ? influenza vaccine IM quadrivalent (Fluzone Quad) (PF) SYR/SDV (56 months of age and older) 08/13/2014, 07/03/2015   ? influenza vaccine IM quadrivalent (Fluzone Quad) MDV (68 months of age and older) 07/06/2016   ? influenza, unspecified formulation 07/07/2011, 07/19/2013, 06/18/2017, 07/13/2017, 06/18/2018   ? pneumococcal conjugate vaccine 13-valent (Prevnar) 10/31/2014, 12/15/2016   ? pneumococcal polysaccharide vaccine 23-valent (Pneumovax) 01/01/2019   ? zoster vac recomb adjuvanted (Shingrix) 01/01/2019, 06/20/2019     Tdap: +  Pneumovax: + +  Zostervax:++  Hepatitis: +  Colon Cancer Screen: +  Prostate Cancer Screen: -  Abdominal US: -  PAP: +  Mammogram: +  Bone Density: +  Eye Exam: +  Dental Exam: +  Flu Shot:+      Physical Exam:     Objective:     Vitals:    Last Recorded Vital Signs:    01/22/20 0949   BP: 109/73   Pulse: 62   Temp: 36.4 ?C (97.6 ?F)   SpO2: 100%     Body mass index is 22.6 kg/m?Marland Kitchen  Vitals:    01/22/20 0949   Weight: 140 lb (63.5 kg)   Height: 5' 6'' (1.676 m)      BP 109/73  ~ Pulse 62  ~ Temp 36.4 ?C (97.6 ?F) (Tympanic)  ~ Ht 5' 6'' (1.676 m)  ~ Wt 140 lb (63.5 kg)  ~ SpO2 100%  ~ BMI 22.60 kg/m?   General appearance: alert, appears stated age and cooperative  Head: Normocephalic, without obvious abnormality, atraumatic  Eyes: conjunctivae/corneas clear. PERRL, EOM's intact. Fundi benign.  Ears: normal TM's and  external ear canals both ears  Nose: Nares normal. Septum midline. Mucosa normal. No drainage or sinus tenderness.  Neck: no adenopathy, no carotid bruit, no JVD, supple, symmetrical, trachea midline and thyroid not enlarged, symmetric, no tenderness/mass/nodules  Back: symmetric, no curvature. ROM normal. No CVA tenderness.  Lungs: clear to auscultation bilaterally  Breasts: normal appearance, no masses or tenderness  Heart: regular rate and rhythm, S1, S2 normal, no murmur, click, rub or gallop  Abdomen: soft, non-tender; bowel sounds normal; no masses,  no organomegaly  Pelvic: deferred  Extremities: extremities normal, atraumatic, no cyanosis or edema Pulses: 2+ and symmetric  Skin: Skin color, texture, turgor normal.  Multiple SEBORRHEIC KERATOSIS    Lymph nodes: cervical, supraclavicular, and axillary nodes normal.  Neurologic: Grossly normal          Lab/Studies:  DRAWN   MA UNABLE TO DRAW BLOOD, NECESSITATING PHYSICIAN DRAW            Assessment & Plan:   Diagnoses and all orders for this visit:    :    Routine general medical examination at a health care facility  -    -     Lipid Panel; Future  -     TSH with reflex FT4, FT3; Future  -       -     Comprehensive Metabolic Panel; Future  -     CBC & Auto Differential; Future  -     Hgb A1c; Future  -     Fecal Occult Blood Immunoassay; Future  -     POCT urinalysis dipstick  -     ECG 12 lead    SK (seborrheic keratosis)   Cryo >20     Mixed hyperlipidemia  -     Lipid Panel; Future  -     Comprehensive Metabolic Panel; Future    Primary insomnia    End of life care   ADVANCE CARE DIRECTIVE REVIEWED/ FORMS GIVEN WILL REVIEW AT NEXT VISIT                Laboratory will be reviewed and communicated to patient.      The below was during visit.     EKG     I have independently reviewed and interpreted the above.   The records are uploaded or scanned into care connect in the patients personal file.      30 min was spent with @NAMEBYAGE @ including over 50% FTF for non preventative related care, counseling, and problem management.       The above plan of care, diagnosis, orders, and follow-up were discussed with the patient.  Questions related to this recommended plan of care were answered.      Author:  Simon Rhein. Lajean Silvius, MD 01/22/2020 10:39 AM

## 2020-01-23 LAB — Comprehensive Metabolic Panel
GFR ESTIMATE FOR AFRICAN AMERICAN: >89 mL/min/{1.73_m2} (ref 0.60–1.30)
GFR ESTIMATE FOR AFRICAN AMERICAN: >89 mL/min/{1.73_m2} (ref 8–64)

## 2020-01-23 LAB — TSH with reflex FT4, FT3: TSH: 1.5 u[IU]/mL (ref 0.3–4.7)

## 2020-01-23 LAB — Lipid Panel: CHOLESTEROL,LDL,CALCULATED: 131 mg/dL — ABNORMAL HIGH (ref <100)

## 2020-01-23 LAB — CBC: MCH CONCENTRATION: 31.3 g/dL — ABNORMAL LOW (ref 31.5–35.5)

## 2020-01-23 LAB — Hgb A1c: HGB A1C - HPLC: 5.7 — ABNORMAL HIGH (ref <5.7)

## 2020-01-23 LAB — Differential Automated: EOSINOPHIL PERCENT, AUTO: 2.5 (ref 1.80–6.90)

## 2020-01-24 ENCOUNTER — Ambulatory Visit: Payer: PRIVATE HEALTH INSURANCE

## 2020-01-29 ENCOUNTER — Ambulatory Visit: Payer: PRIVATE HEALTH INSURANCE

## 2020-01-30 MED ORDER — ATORVASTATIN CALCIUM 10 MG PO TABS
10 mg | ORAL_TABLET | Freq: Every day | ORAL | 3 refills | Status: AC
Start: 2020-01-30 — End: ?

## 2020-01-31 ENCOUNTER — Other Ambulatory Visit: Payer: Self-pay

## 2020-02-04 ENCOUNTER — Other Ambulatory Visit (HOSPITAL_COMMUNITY): Payer: Self-pay | Admitting: Internal Medicine

## 2020-02-26 ENCOUNTER — Encounter: Payer: Self-pay | Admitting: Sports Medicine

## 2020-02-26 ENCOUNTER — Ambulatory Visit: Payer: Medicare PPO | Admitting: Sports Medicine

## 2020-02-26 ENCOUNTER — Other Ambulatory Visit: Payer: Self-pay

## 2020-02-26 ENCOUNTER — Ambulatory Visit (INDEPENDENT_AMBULATORY_CARE_PROVIDER_SITE_OTHER): Payer: Medicare PPO

## 2020-02-26 VITALS — BP 115/65 | HR 67 | Temp 96.6°F

## 2020-02-26 DIAGNOSIS — M21612 Bunion of left foot: Secondary | ICD-10-CM | POA: Diagnosis not present

## 2020-02-26 DIAGNOSIS — L602 Onychogryphosis: Secondary | ICD-10-CM | POA: Diagnosis not present

## 2020-02-26 DIAGNOSIS — M79675 Pain in left toe(s): Secondary | ICD-10-CM

## 2020-02-26 MED ORDER — NEOMYCIN-POLYMYXIN-HC 3.5-10000-1 OP SUSP: OPHTHALMIC | 1 refills | Status: DC

## 2020-02-26 NOTE — Patient Instructions (Signed)

## 2020-02-26 NOTE — Progress Notes (Signed)
Subjective: Jennifer [name] is a 64 y.o. female patient presents to office today complaining of a moderately painful thickened left 1st toenail that is very painful and has been getting worse over time as well as increased bunion pain with shoes of the last few years. Tried Lamisil 6 months ago and OTC tylenol for the bunion. Patient denies fever/chills/nausea/vomitting/any other related constitutional symptoms at this time.  Review of Systems  All other systems reviewed and are negative.    Patient Active Problem List   Diagnosis Date Noted  . Acute on chronic combined systolic and diastolic CHF (congestive heart failure) ([place])   . AKI (acute kidney injury) ([place]) 11/28/2018  . Shortness of breath 11/28/2018  . Bilateral pulmonary embolism ([place]) 11/27/2018  . Ischemic cardiomyopathy 10/09/2018  . HTN (hypertension) 10/09/2018  . Dyslipidemia, goal LDL below 70 10/09/2018  . Fibromyalgia 10/09/2018  . S/P CABG (coronary artery bypass graft) 09/22/2018    Current Outpatient Medications on File Prior to Visit  Medication Sig Dispense Refill  . Ascorbic Acid (VITAMIN C) 1000 MG tablet Take 1,000 mg by mouth daily.    Marland Kitchen aspirin EC 81 MG EC tablet Take 1 tablet (81 mg total) by mouth every evening. 30 tablet 0  . atorvastatin (LIPITOR) 80 MG tablet Take 1 tablet (80 mg total) by mouth daily at 6 PM. 30 tablet 1  . calamine lotion Apply 1 application topically as needed for itching.    . carvedilol (COREG) 12.5 MG tablet Take 1 tablet (12.5 mg total) by mouth 2 (two) times daily. 180 tablet 3  . cholecalciferol (VITAMIN D3) 25 MCG (1000 UT) tablet Take 1,000 Units by mouth daily.    . dapagliflozin propanediol (FARXIGA) 10 MG TABS tablet Take 10 mg by mouth daily before breakfast. 30 tablet 6  . digoxin (LANOXIN) 0.125 MG tablet TAKE 1 TABLET BY MOUTH DAILY 90 tablet 3  . DULoxetine (CYMBALTA) 60 MG capsule Take 60 mg by mouth 2 (two) times daily.    . furosemide (LASIX) 40 MG tablet Take 40  mg by mouth every other day. May take an additional tablet as needed for weight gain    . gabapentin (NEURONTIN) 300 MG capsule Take 600 mg by mouth 3 (three) times daily.    Marland Kitchen Phenylephrine-APAP-guaiFENesin (MUCINEX SINUS-MAX PO) Take 2 tablets by mouth daily as needed (sinus headaches).    . pramipexole (MIRAPEX) 0.5 MG tablet Take 0.5 mg by mouth at bedtime.     . sacubitril-valsartan (ENTRESTO) 97-103 MG Take 1 tablet by mouth 2 (two) times daily. 180 tablet 3  . spironolactone (ALDACTONE) 25 MG tablet Take 1 tablet (25 mg total) by mouth daily. 90 tablet 3  . vitamin B-12 (CYANOCOBALAMIN) 500 MCG tablet Take 1 tablet (500 mcg total) by mouth daily. 30 tablet 0  . XARELTO 20 MG TABS tablet      No current facility-administered medications on file prior to visit.    Allergies  Allergen Reactions  . Poison Oak Extract Rash    Objective:  There were no vitals filed for this visit.  General: Well developed, nourished, in no acute distress, alert and oriented x3   Dermatology: Skin is warm, dry and supple bilateral. Left hallux nail appears to be severely thickened and distal lifting (-) Erythema. (+) Edema. (-) serosanguous drainage present. The remaining nails appear unremarkable at this time. There is no nail to right great toe, hisitory of previous removal with partial distal phalanx amputation. Old scars at left ankle,  previous ankle fracture surgery. There are no open sores, lesions or other signs of infection  present.  Vascular: Dorsalis Pedis artery and Posterior Tibial artery pedal pulses are 1/4 bilateral with immedate capillary fill time. Pedal hair growth present. No lower extremity edema.   Neruologic: Grossly intact via light touch bilateral.  Musculoskeletal: Tenderness to palpation of the Left hallux nail. There is mild pain to bunion L>R. Muscular strength within normal limits in all groups bilateral.   Xrays consistent with bunion deformity   Assesement and  Plan: Problem List Items Addressed This Visit    None    Visit Diagnoses    Bunion, left foot    -  Primary   Relevant Orders   DG Foot Complete Left (Completed)   Onychogryposis of toenail       Toe pain, left          -Discussed treatment alternatives and plan of care; Explained permanent/temporary nail avulsion and post procedure course to patient. Patient elects for temp hallux nail removal on left - After a verbal and written consent, injected 3 ml of a 50:50 mixture of 2% plain lidocaine and 0.5% plain marcaine in a normal hallux block fashion. Next, a betadine prep was performed. Anesthesia was tested and found to be appropriate.  The offending Left hallux nail completely was then incised from the hyponychium to the epinychium. The offending nail  was removed and cleared from the field. The area was curretted for any remaining nail or spicules and the area was then flushed with alcohol and dressed with antibiotic cream and a dry sterile dressing. -Patient was instructed to leave the dressing intact for today and begin soaking in a weak solution of betadine or Epsom salt and water tomorrow. Patient was instructed to  soak for 15-20 minutes each day and apply neosporin/corticosporin and a gauze or bandaid dressing each day. -Patient was instructed to monitor the toe for signs of infection and return to office if toe becomes red, hot or swollen. -Advised ice, elevation, and tylenol or motrin if needed for pain.  -Advised patient that we will further discuss her bunion at a follow up visit -Patient is to return in 2 weeks for follow up care/nail check or sooner if problems arise.  Landis Martins, DPM

## 2020-02-27 ENCOUNTER — Other Ambulatory Visit: Payer: Self-pay | Admitting: Sports Medicine

## 2020-02-28 MED ORDER — NEOMYCIN-POLYMYXIN-HC 1 % OT SOLN: OTIC | 1 refills | Status: DC

## 2020-03-11 ENCOUNTER — Other Ambulatory Visit: Payer: Self-pay

## 2020-03-11 ENCOUNTER — Ambulatory Visit: Payer: Medicare PPO | Admitting: Sports Medicine

## 2020-03-11 ENCOUNTER — Encounter: Payer: Self-pay | Admitting: Sports Medicine

## 2020-03-11 VITALS — Temp 96.0°F

## 2020-03-11 DIAGNOSIS — Z9889 Other specified postprocedural states: Secondary | ICD-10-CM

## 2020-03-11 DIAGNOSIS — M79675 Pain in left toe(s): Secondary | ICD-10-CM

## 2020-03-11 NOTE — Progress Notes (Signed)
Subjective: Jennifer [name] is a 64 y.o. female patient returns to office today for follow up evaluation after having left hallux total temporary nail avulsion performed on 02/26/2020. Patient has been soaking using epsom salt and applying topical antibiotic covered with bandaid daily. Patient admits to some soreness but slowly getting better, deniesfever/chills/nausea/vomitting/any other related constitutional symptoms at this time.  On Xarelto without any problems or issues.  Patient Active Problem List   Diagnosis Date Noted  . Acute on chronic combined systolic and diastolic CHF (congestive heart failure) ([place])   . AKI (acute kidney injury) ([place]) 11/28/2018  . Shortness of breath 11/28/2018  . Bilateral pulmonary embolism ([place]) 11/27/2018  . Ischemic cardiomyopathy 10/09/2018  . HTN (hypertension) 10/09/2018  . Dyslipidemia, goal LDL below 70 10/09/2018  . Fibromyalgia 10/09/2018  . S/P CABG (coronary artery bypass graft) 09/22/2018    Current Outpatient Medications on File Prior to Visit  Medication Sig Dispense Refill  . Ascorbic Acid (VITAMIN C) 1000 MG tablet Take 1,000 mg by mouth daily.    Marland Kitchen aspirin EC 81 MG EC tablet Take 1 tablet (81 mg total) by mouth every evening. 30 tablet 0  . atorvastatin (LIPITOR) 80 MG tablet Take 1 tablet (80 mg total) by mouth daily at 6 PM. 30 tablet 1  . calamine lotion Apply 1 application topically as needed for itching.    . carvedilol (COREG) 12.5 MG tablet Take 1 tablet (12.5 mg total) by mouth 2 (two) times daily. 180 tablet 3  . cholecalciferol (VITAMIN D3) 25 MCG (1000 UT) tablet Take 1,000 Units by mouth daily.    . dapagliflozin propanediol (FARXIGA) 10 MG TABS tablet Take 10 mg by mouth daily before breakfast. 30 tablet 6  . digoxin (LANOXIN) 0.125 MG tablet TAKE 1 TABLET BY MOUTH DAILY 90 tablet 3  . DULoxetine (CYMBALTA) 60 MG capsule Take 60 mg by mouth 2 (two) times daily.    . furosemide (LASIX) 40 MG tablet Take 40 mg by mouth every  other day. May take an additional tablet as needed for weight gain    . gabapentin (NEURONTIN) 300 MG capsule Take 600 mg by mouth 3 (three) times daily.    . NEOMYCIN-POLYMYXIN-HYDROCORTISONE (CORTISPORIN) 1 % SOLN OTIC solution Apply 1-2 drops to affected toe(s). 10 mL 1  . Phenylephrine-APAP-guaiFENesin (MUCINEX SINUS-MAX PO) Take 2 tablets by mouth daily as needed (sinus headaches).    . pramipexole (MIRAPEX) 0.5 MG tablet Take 0.5 mg by mouth at bedtime.     . sacubitril-valsartan (ENTRESTO) 97-103 MG Take 1 tablet by mouth 2 (two) times daily. 180 tablet 3  . spironolactone (ALDACTONE) 25 MG tablet Take 1 tablet (25 mg total) by mouth daily. 90 tablet 3  . vitamin B-12 (CYANOCOBALAMIN) 500 MCG tablet Take 1 tablet (500 mcg total) by mouth daily. 30 tablet 0  . XARELTO 20 MG TABS tablet      No current facility-administered medications on file prior to visit.    Allergies  Allergen Reactions  . Poison Oak Extract Rash    Objective:  General: Well developed, nourished, in no acute distress, alert and oriented x3   Dermatology: Skin is warm, dry and supple bilateral.  Left hallux nail bed appears to be clean, dry, with mild granular tissue and surrounding eschar/scab.  Mild blanchable erythema. (-) Edema. (-) serosanguous drainage present.There is no nail to right great toe, hisitory of previous removal with partial distal phalanx amputation. Old scars at left ankle, previous ankle fracture surgery. There  are no open sores, lesions or other signs of infection present.  Neurovascular status: Intact. No lower extremity swelling; No pain with calf compression bilateral.  Musculoskeletal: Decreased tenderness to palpation of the left hallux nail fold.  Mild bunion left greater than right.  Right hallux toe deformity from previous surgery.  Muscular strength within normal limits bilateral.   Assesement and Plan: Problem List Items Addressed This Visit    None    Visit Diagnoses    S/P  nail surgery    -  Primary   Toe pain, left          -Examined patient  -Cleansed left hallux nail bed and gently scrubbed with peroxide and q-tip/curetted away eschar at site and applied antibiotic cream covered with bandaid.  -Discussed plan of care with patient. -Patient may discontinue soaking with Epson salt.  May leave open to air at night. -Educated patient on long term care after nail surgery. -Patient was instructed to monitor the toe for reoccurrence and signs of infection; Patient advised to return to office or go to ER if toe becomes red, hot or swollen. -Patient is to return as needed or sooner if problems arise.  Landis Martins, DPM

## 2020-03-14 ENCOUNTER — Telehealth (HOSPITAL_COMMUNITY): Payer: Self-pay | Admitting: Licensed Clinical Social Worker

## 2020-03-14 NOTE — Telephone Encounter (Signed)
Pt called CSW and informed that she had applications for [employer] and Wynetta Emery and [place] patient assistance that needed to be resubmitted for renewal to help with Entresto and Xarelto.  Pt brought in application and CSW assisted in completing- completed applications faxed in for review- fax confirmation received for both applications  CSW will continue to follow and assist as needed  Jorge Ny, [place] Worker [facility] Desk#: [phone] Cell#: [phone]

## 2020-03-21 ENCOUNTER — Telehealth (HOSPITAL_COMMUNITY): Payer: Self-pay | Admitting: Licensed Clinical Social Worker

## 2020-03-21 NOTE — Telephone Encounter (Signed)
Received notification that pt has been approved for [employer] pt assistance to receive Entresto  Expires 10/17/20 Pt ID: [id number]  CSW called pt and informed- she was enrolled previously and knows how to order medication through [employer]- ensured she still had the correct number  Jorge Ny, [facility] Desk#: [phone] Cell#: [phone]

## 2020-03-27 ENCOUNTER — Telehealth: Payer: Self-pay

## 2020-03-27 NOTE — Telephone Encounter (Signed)
Spoke with patient to remind of missed remote transmission. Patient is out of town until 04/05/20. Will transmit when returns.

## 2020-04-04 ENCOUNTER — Ambulatory Visit (INDEPENDENT_AMBULATORY_CARE_PROVIDER_SITE_OTHER): Payer: Medicare PPO | Admitting: *Deleted

## 2020-04-04 DIAGNOSIS — I255 Ischemic cardiomyopathy: Secondary | ICD-10-CM | POA: Diagnosis not present

## 2020-04-05 LAB — CUP PACEART REMOTE DEVICE CHECK
Battery Remaining Longevity: 90 mo
Battery Remaining Percentage: 91 %
Battery Voltage: 3.13 V
Brady Statistic AP VP Percent: 1 %
Brady Statistic AP VS Percent: 1 %
Brady Statistic AS VP Percent: 1 %
Brady Statistic AS VS Percent: 99 %
Brady Statistic RA Percent Paced: 1 %
Brady Statistic RV Percent Paced: 1 %
Date Time Interrogation Session: 20210618154151
HighPow Impedance: 79 Ohm
HighPow Impedance: 79 Ohm
Implantable Lead Implant Date: 20201007
Implantable Lead Implant Date: 20201007
Implantable Lead Location: 753859
Implantable Lead Location: 753860
Implantable Lead Model: 7122
Implantable Pulse Generator Implant Date: 20201007
Lead Channel Impedance Value: 460 Ohm
Lead Channel Impedance Value: 780 Ohm
Lead Channel Pacing Threshold Amplitude: 0.75 V
Lead Channel Pacing Threshold Amplitude: 0.75 V
Lead Channel Pacing Threshold Pulse Width: 0.5 ms
Lead Channel Pacing Threshold Pulse Width: 0.5 ms
Lead Channel Sensing Intrinsic Amplitude: 12 mV
Lead Channel Sensing Intrinsic Amplitude: 4.5 mV
Lead Channel Setting Pacing Amplitude: 2 V
Lead Channel Setting Pacing Amplitude: 2.5 V
Lead Channel Setting Pacing Pulse Width: 0.5 ms
Lead Channel Setting Sensing Sensitivity: 0.5 mV
Pulse Gen Serial Number: 9901215

## 2020-04-07 NOTE — Progress Notes (Signed)
Remote ICD transmission.   

## 2020-04-09 ENCOUNTER — Inpatient Hospital Stay (HOSPITAL_COMMUNITY)
Admission: RE | Admit: 2020-04-09 | Discharge: 2020-04-09 | Disposition: A | Discharge status: Home / Self Care | Payer: Medicare PPO | Source: Ambulatory Visit | Attending: Internal Medicine | Admitting: Internal Medicine

## 2020-04-09 NOTE — Progress Notes (Signed)
error 

## 2020-04-11 ENCOUNTER — Telehealth (HOSPITAL_COMMUNITY): Payer: Self-pay | Admitting: Pharmacy Technician

## 2020-04-11 NOTE — Telephone Encounter (Signed)
Patient was denied to receive assistance for Xarelto from [employer] at this time.  She would need to spend $1040 out of pocket before she would qualify.  Will call and see if she can get an out of pocket expense report from her pharmacy to see how much has been spent this year.  Will follow up.

## 2020-04-14 NOTE — Telephone Encounter (Signed)
Spoke with patient, she is going to try to get the OOP expense faxed from [facility] to the office. If [employer] is unable to send it to me, she will get the report once she gets back from her sons wedding in [place].  Will follow up.

## 2020-04-16 DIAGNOSIS — M545 Low back pain: Secondary | ICD-10-CM | POA: Diagnosis not present

## 2020-04-16 DIAGNOSIS — I10 Essential (primary) hypertension: Secondary | ICD-10-CM | POA: Diagnosis not present

## 2020-05-19 NOTE — Telephone Encounter (Signed)
Spoke with patient. [employer] has yet to send it the expense report. Patient is going to call and check to make sure it was mailed out to her. I advised her that the insurance company may also have a way for her to print it off line and that may be quicker for her.  She is going to get back in touch with me once she speaks with [facility]. We are going to provide 3 weeks of Xarelto samples for her. They will be at the front check in desk.  Will follow up.

## 2020-06-09 ENCOUNTER — Telehealth (HOSPITAL_COMMUNITY): Payer: Self-pay | Admitting: *Deleted

## 2020-06-09 NOTE — Telephone Encounter (Signed)
Pt left VM stating she tested positive for Covid and wants to know if there is anything she can do to protect her heart.    Routed to [place] for advice

## 2020-06-10 ENCOUNTER — Encounter (HOSPITAL_COMMUNITY): Payer: Self-pay | Admitting: *Deleted

## 2020-06-10 NOTE — Telephone Encounter (Signed)
Nothing in particular. Just rest and follow closely

## 2020-06-10 NOTE — Telephone Encounter (Signed)
Advice sent via mychart °

## 2020-06-12 DIAGNOSIS — U071 COVID-19: Secondary | ICD-10-CM | POA: Diagnosis not present

## 2020-06-12 DIAGNOSIS — I10 Essential (primary) hypertension: Secondary | ICD-10-CM | POA: Diagnosis not present

## 2020-06-12 DIAGNOSIS — Z299 Encounter for prophylactic measures, unspecified: Secondary | ICD-10-CM | POA: Diagnosis not present

## 2020-06-12 DIAGNOSIS — M797 Fibromyalgia: Secondary | ICD-10-CM | POA: Diagnosis not present

## 2020-06-12 MED ORDER — ATORVASTATIN CALCIUM 10 MG PO TABS
ORAL_TABLET | 0 refills | Status: AC
Start: 2020-06-12 — End: ?

## 2020-06-24 ENCOUNTER — Telehealth (HOSPITAL_COMMUNITY): Payer: Self-pay | Admitting: Cardiology

## 2020-06-24 NOTE — Telephone Encounter (Signed)
Called and left a message for the patient. I have not received the picture of her OOP that she stated she sent me. Will be here to assist in the future as needed.  Charlann Boxer, CPhT

## 2020-06-24 NOTE — Telephone Encounter (Signed)
Patients daughter called to report her mother tested positive for COVID ~ 2 weeks ago -b/p has been running lower 92/70,94/72,and 89/65 are the lowest readings  -increase in fatigue and SOB while ambulating through home  -weight has decreased as her appetitie has decreased  Advised would forward to provider to see if medication changes are needed while she is recovering

## 2020-06-25 NOTE — Telephone Encounter (Signed)
I called her back to follow up.   Diagnosed with COVID the end of August. Says she is feeling a little better but it has taken a few weeks get any strength back. Still sleeping 12 hours.   Remains SOB with exertion and fatigue. [place].   Weight at home 195-198 pounds.   I have asked her to hold lasix until she returns for HF appointment.  Liora Myles NP-C  1:03 PM

## 2020-07-02 ENCOUNTER — Encounter (HOSPITAL_COMMUNITY): Payer: Self-pay

## 2020-07-02 ENCOUNTER — Other Ambulatory Visit: Payer: Self-pay

## 2020-07-02 ENCOUNTER — Ambulatory Visit (HOSPITAL_COMMUNITY)
Admission: RE | Admit: 2020-07-02 | Discharge: 2020-07-02 | Disposition: A | Discharge status: Home / Self Care | Payer: Medicare PPO | Source: Ambulatory Visit | Attending: Cardiology | Admitting: Cardiology

## 2020-07-02 VITALS — BP 112/66 | HR 71 | Ht 62.0 in | Wt 200.2 lb

## 2020-07-02 DIAGNOSIS — G4730 Sleep apnea, unspecified: Secondary | ICD-10-CM | POA: Diagnosis not present

## 2020-07-02 DIAGNOSIS — I5042 Chronic combined systolic (congestive) and diastolic (congestive) heart failure: Secondary | ICD-10-CM

## 2020-07-02 DIAGNOSIS — Z951 Presence of aortocoronary bypass graft: Secondary | ICD-10-CM | POA: Diagnosis not present

## 2020-07-02 DIAGNOSIS — E7800 Pure hypercholesterolemia, unspecified: Secondary | ICD-10-CM | POA: Diagnosis not present

## 2020-07-02 DIAGNOSIS — Z7984 Long term (current) use of oral hypoglycemic drugs: Secondary | ICD-10-CM | POA: Insufficient documentation

## 2020-07-02 DIAGNOSIS — Z8616 Personal history of COVID-19: Secondary | ICD-10-CM | POA: Diagnosis not present

## 2020-07-02 DIAGNOSIS — Z7901 Long term (current) use of anticoagulants: Secondary | ICD-10-CM | POA: Diagnosis not present

## 2020-07-02 DIAGNOSIS — I25810 Atherosclerosis of coronary artery bypass graft(s) without angina pectoris: Secondary | ICD-10-CM | POA: Insufficient documentation

## 2020-07-02 DIAGNOSIS — I110 Hypertensive heart disease with heart failure: Secondary | ICD-10-CM | POA: Diagnosis not present

## 2020-07-02 DIAGNOSIS — Z86718 Personal history of other venous thrombosis and embolism: Secondary | ICD-10-CM | POA: Diagnosis not present

## 2020-07-02 DIAGNOSIS — I5022 Chronic systolic (congestive) heart failure: Secondary | ICD-10-CM | POA: Insufficient documentation

## 2020-07-02 DIAGNOSIS — F329 Major depressive disorder, single episode, unspecified: Secondary | ICD-10-CM | POA: Diagnosis not present

## 2020-07-02 DIAGNOSIS — M797 Fibromyalgia: Secondary | ICD-10-CM | POA: Insufficient documentation

## 2020-07-02 DIAGNOSIS — Z8249 Family history of ischemic heart disease and other diseases of the circulatory system: Secondary | ICD-10-CM | POA: Insufficient documentation

## 2020-07-02 DIAGNOSIS — Z86711 Personal history of pulmonary embolism: Secondary | ICD-10-CM | POA: Diagnosis not present

## 2020-07-02 DIAGNOSIS — Z87891 Personal history of nicotine dependence: Secondary | ICD-10-CM | POA: Insufficient documentation

## 2020-07-02 DIAGNOSIS — I340 Nonrheumatic mitral (valve) insufficiency: Secondary | ICD-10-CM | POA: Diagnosis not present

## 2020-07-02 DIAGNOSIS — Z79899 Other long term (current) drug therapy: Secondary | ICD-10-CM | POA: Diagnosis not present

## 2020-07-02 DIAGNOSIS — I493 Ventricular premature depolarization: Secondary | ICD-10-CM | POA: Insufficient documentation

## 2020-07-02 DIAGNOSIS — R0683 Snoring: Secondary | ICD-10-CM | POA: Insufficient documentation

## 2020-07-02 DIAGNOSIS — Z7982 Long term (current) use of aspirin: Secondary | ICD-10-CM | POA: Diagnosis not present

## 2020-07-02 DIAGNOSIS — I50814 Right heart failure due to left heart failure: Secondary | ICD-10-CM | POA: Diagnosis not present

## 2020-07-02 LAB — CBC
HCT: 42.9 % (ref 36.0–46.0)
Hemoglobin: 13.7 g/dL (ref 12.0–15.0)
MCH: 32.5 pg (ref 26.0–34.0)
MCHC: 31.9 g/dL (ref 30.0–36.0)
MCV: 101.7 fL — ABNORMAL HIGH (ref 80.0–100.0)
Platelets: 270 10*3/uL (ref 150–400)
RBC: 4.22 MIL/uL (ref 3.87–5.11)
RDW: 12.5 % (ref 11.5–15.5)
WBC: 6.5 10*3/uL (ref 4.0–10.5)
nRBC: 0 % (ref 0.0–0.2)

## 2020-07-02 LAB — BASIC METABOLIC PANEL
Anion gap: 9 (ref 5–15)
BUN: 16 mg/dL (ref 8–23)
CO2: 25 mmol/L (ref 22–32)
Calcium: 9.3 mg/dL (ref 8.9–10.3)
Chloride: 105 mmol/L (ref 98–111)
Creatinine, Ser: 0.73 mg/dL (ref 0.44–1.00)
GFR calc Af Amer: >60 mL/min (ref >60)
GFR calc non Af Amer: >60 mL/min (ref >60)
Glucose, Bld: 93 mg/dL (ref 70–99)
Potassium: 4.3 mmol/L (ref 3.5–5.1)
Sodium: 139 mmol/L (ref 135–145)

## 2020-07-02 LAB — BRAIN NATRIURETIC PEPTIDE: B Natriuretic Peptide: 97.2 pg/mL (ref 0.0–100.0)

## 2020-07-02 NOTE — Patient Instructions (Signed)
Take Lasix 40 mg daily for two days, then resume normal dose of as needed for weight gain or swelling  Labs today We will only contact you if something comes back abnormal or we need to make some changes. Otherwise no news is good news!  Your physician has requested that you have an echocardiogram. Echocardiography is a painless test that uses sound waves to create images of your heart. It provides your doctor with information about the size and shape of your heart and how well your heart's chambers and valves are working. This procedure takes approximately one hour. There are no restrictions for this procedure.   Keep followup as scheduled with Dr Haroldine Laws next month  If you have any questions or concerns before your next appointment please send [place] a message through [place] or call our office at [phone].    TO LEAVE A MESSAGE FOR THE NURSE SELECT OPTION 2, PLEASE LEAVE A MESSAGE INCLUDING: . YOUR NAME . DATE OF BIRTH . CALL BACK NUMBER . REASON FOR CALL**this is important as we prioritize the call backs  YOU WILL RECEIVE A CALL BACK THE SAME DAY AS LONG AS YOU CALL BEFORE 4:00 PM

## 2020-07-02 NOTE — Addendum Note (Signed)
Encounter addended by: Harvie Junior, CMA on: 07/02/2020 2:23 PM  Actions taken: Charge Capture section accepted

## 2020-07-02 NOTE — Progress Notes (Signed)
Advanced Heart Failure Clinic Note  Date:  07/02/2020   ID:  DAZIAH HESLER, DOB March 19, 1956, MRN [id number]  Location: Home  Provider location: [facility] Advanced Heart Failure Type of Visit: Established patient   PCP:  Monico Blitz, MD  Cardiologist:  Kate Sable, MD (Inactive) Primary HF: Dr Haroldine Laws  EP: Dr Curt Bears  Chief Complaint: F/u for Chronic Systolic Heart Failure, S/p COVID   History of Present Illness:  Jennifer [name] is a 64 y.o. female with a history of systolic HF due to mixed ICM/NICM, DVT, PE, fibromyalgia.   Long h/o reported mild NICM EF 40-45%. Echo 11/19 EF 15-20%  Catheterization done12/05/19revealed 90% proximal LAD and a 50% circumflex stenosis. She underwent bypass grafting x2 with an L IMA to the LAD and an SVG to the OM on 09/22/18 with Dr Roxan Hockey.  Post CABG EF remained depressed.   -cMRI 2/20 LVEF 21% Moderately reduced RV, moderateTR. No LGE - ECHO 02/2019 EF 20-25% RV severerly reduced   Zio Patch 5/20 7.3 % PVCs 1. Predominant underlying rhythm was Sinus Rhythm. Minimum HR of 56 bpm, max HR of 176 bpm, and avg HR of 77 bpm. 2. Three runs of NSVT - longest 10 beats 3. Seven runs of SVT - longest lasting 9 beats   Started carvedilol 03/13/2019   Underwent SJ ICD 07/25/19   Echo 9/20 EF 20-25%. RV ok.   Echo 12/26/19 EF 20-25% RV normal.   She presents to clinic today as a work in. She is here w/ her daughter. She recently contracted COVID in late August (not vaccinated).She did not require hospitalization. She had cough, fatigue and increased dyspnea. Also lost of taste w/ poor appetite and reduced PO intake. Was sleeping a lot and had missed several doses of her HF meds including diuretics. Also eating mostly soup. She called the office last week due to increased fatigue and low BP. SBPs in the 80s-90s. Felt to be likely dehydrated from poor PO intake. She was instructed to stop her Lasix. She has been taking all of her other HF  meds more regularly now.   Over the last several days she has noticed significant improvement. Energy is improving, she is less SOB and less fatigue. [place] Yesterday, she was able to walk around the pastures w/o much difficulty and no significant exertional dyspnea. BP today is 118/60. Her wt however is up 5 lb (dry wt is 195 lb, 200 lb today). She is fluid overloaded by CorVue. Impedence down since late August. No AF. No VT.    CPX 07/09/19  FVC 2.09 (73%)    FEV1 1.47 (65%)     FEV1/FVC 70 (88%)     MVV 70 (81%)     Resting HR: 52 Standing HR:48 Peak HR: 102  (65% age predicted max HR)  BP rest: 84/60 Standing BP:90/60 BP peak: 124/60  Peak VO2: 16.0 (89% predicted peak VO2)  VE/VCO2 slope: 29  OUES: 1.81  Peak RER: 1.05  VE/MVV: 67%   Cardiac studies:  Echo  02/19/2019 EF 20-25% RV severely reduced 11/28/2018 EF 10-15% with moderate RV dysfunction.  12/26/19 EF 20-25% RV normal.   cMRI 12/05/18 1. Moderately dilated LV with EF 21%, diffuse hypokinesis. 2. Normal RV size with moderately decreased systolic function, EF 02%. 3. Moderate TR, at least moderate central MR (likely functional). 4. No myocardial LGE, so no evidence for prior infarct, infiltrative disease, or myocarditis.  [place] 12/05/18 On milrinone  0.125 RA = 8 RV = 37/10 PA = 39/15 (25) PCW = 19 (v = 35) Fick cardiac output/index = 5.0/2.8 PVR = 1.1 WU Ao sat = 94% PA sat = 66%, 66% PaPi = 3.0 RA/PCW = 0.42  Past Medical History:  Diagnosis Date  . Arthritis    "probably; maybe in my hands" (09/21/2018)  . Congestive heart failure (CHF) ([place])   . Dyspnea   . Fibromyalgia   . GERD (gastroesophageal reflux disease)   . Hypercholesteremia   . Hypertension   . IBS (irritable bowel syndrome)   . Mild sleep apnea    no CPAP (09/21/2018)  . Myocardial infarction [facility])    "was told I have had one; never knew about it" (09/21/2018)  . Nonischemic cardiomyopathy (HCC)    ECHO EF  40-45%, Septal Thickness,and mild global left ventricular dysfunction; stress test 03/2004 +VE, refersible defect; ECHO 03/2005 @[facility]  EF 55%  . Pulmonary embolus [facility])    Past Surgical History:  Procedure Laterality Date  . ANKLE FRACTURE SURGERY Left 2000  . BUNIONECTOMY Right 1990s?   "& removed part of my great toe due to fungus under nail"  . CARDIAC CATHETERIZATION  03/2005  . CORONARY ARTERY BYPASS GRAFT N/A 09/22/2018   Procedure: CORONARY ARTERY BYPASS GRAFTING (CABG), ON PUMP, TIMES TWO, USING LEFT INTERNAL MAMMARY ARTERY AND ENDOSCOPICALLY HARVESTED RIGHT GREATER SAPHENOUS VEIN;  Surgeon: Melrose Nakayama, MD;  Location: [place];  Service: Open Heart Surgery;  Laterality: N/A;  . DILATION AND CURETTAGE OF UTERUS    . FRACTURE SURGERY    . ICD IMPLANT N/A 07/25/2019   Procedure: ICD IMPLANT;  Surgeon: Constance Haw, MD;  Location: [place] CV LAB;  Service: Cardiovascular;  Laterality: N/A;  . RIGHT HEART CATH N/A 12/05/2018   Procedure: RIGHT HEART CATH;  Surgeon: Jolaine Artist, MD;  Location: [place] CV LAB;  Service: Cardiovascular;  Laterality: N/A;  . RIGHT/LEFT HEART CATH AND CORONARY ANGIOGRAPHY N/A 09/21/2018   Procedure: RIGHT/LEFT HEART CATH AND CORONARY ANGIOGRAPHY;  Surgeon: Troy Sine, MD;  Location: [place] CV LAB;  Service: Cardiovascular;  Laterality: N/A;  . TEE WITHOUT CARDIOVERSION N/A 09/22/2018   Procedure: TRANSESOPHAGEAL ECHOCARDIOGRAM (TEE);  Surgeon: Melrose Nakayama, MD;  Location: [place];  Service: Open Heart Surgery;  Laterality: N/A;  . TUBAL LIGATION       Current Outpatient Medications  Medication Sig Dispense Refill  . albuterol (VENTOLIN HFA) 108 (90 Base) MCG/ACT inhaler     . Ascorbic Acid (VITAMIN C) 1000 MG tablet Take 1,000 mg by mouth daily.    Marland Kitchen aspirin EC 81 MG EC tablet Take 1 tablet (81 mg total) by mouth every evening. 30 tablet 0  . atorvastatin (LIPITOR) 80 MG tablet Take 1 tablet (80 mg total) by mouth  daily at 6 PM. 30 tablet 1  . carvedilol (COREG) 12.5 MG tablet Take 1 tablet (12.5 mg total) by mouth 2 (two) times daily. 180 tablet 3  . cholecalciferol (VITAMIN D3) 25 MCG (1000 UT) tablet Take 1,000 Units by mouth daily.    . dapagliflozin propanediol (FARXIGA) 10 MG TABS tablet Take 10 mg by mouth daily before breakfast. 30 tablet 6  . digoxin (LANOXIN) 0.125 MG tablet TAKE 1 TABLET BY MOUTH DAILY 90 tablet 3  . DULoxetine (CYMBALTA) 60 MG capsule Take 60 mg by mouth 2 (two) times daily.    Marland Kitchen gabapentin (NEURONTIN) 300 MG capsule Take 600 mg by mouth 3 (three) times daily.    Marland Kitchen  NEOMYCIN-POLYMYXIN-HYDROCORTISONE (CORTISPORIN) 1 % SOLN OTIC solution Apply 1-2 drops to affected toe(s). 10 mL 1  . Phenylephrine-APAP-guaiFENesin (MUCINEX SINUS-MAX PO) Take 2 tablets by mouth daily as needed (sinus headaches).    . pramipexole (MIRAPEX) 0.5 MG tablet Take 0.5 mg by mouth at bedtime.     . sacubitril-valsartan (ENTRESTO) 97-103 MG Take 1 tablet by mouth 2 (two) times daily. 180 tablet 3  . spironolactone (ALDACTONE) 25 MG tablet Take 1 tablet (25 mg total) by mouth daily. 90 tablet 3  . vitamin B-12 (CYANOCOBALAMIN) 500 MCG tablet Take 1 tablet (500 mcg total) by mouth daily. 30 tablet 0  . XARELTO 20 MG TABS tablet     . calamine lotion Apply 1 application topically as needed for itching. (Patient not taking: Reported on 07/02/2020)    . furosemide (LASIX) 40 MG tablet Take 40 mg by mouth every other day. May take an additional tablet as needed for weight gain (Patient not taking: Reported on 07/02/2020)     No current facility-administered medications for this encounter.    Allergies:   Poison oak extract   Social History:  The patient  reports that she quit smoking about 29 years ago. Her smoking use included cigarettes. She has a 30.00 pack-year smoking history. She has never used smokeless tobacco. She reports previous alcohol use. She reports that she does not use drugs.   Family History:   The patient's family history includes Alcohol abuse in her father; Colon cancer in her father; Congestive Heart Failure (age of onset: 21) in her brother; Depression in her father; Diabetes Mellitus II in her mother; Heart disease (age of onset: 32) in her mother.   ROS:  Please see the history of present illness.   All other systems are personally reviewed and negative.  Vitals:   07/02/20 1110  BP: 112/66  Pulse: 71  SpO2: 95%    PHYSICAL EXAM: General:  Well appearing, moderately obese. No respiratory difficulty HEENT: normal Neck: supple. no JVD. Carotids 2+ bilat; no bruits. No lymphadenopathy or thyromegaly appreciated. Cor: PMI nondisplaced. Regular rate & rhythm. No rubs, gallops or murmurs. Lungs: clear. No wheezing, crackles nor rhonchi  Abdomen: soft, nontender, nondistended. No hepatosplenomegaly. No bruits or masses. Good bowel sounds. Extremities: no cyanosis, clubbing, rash, edema Neuro: alert & oriented x 3, cranial nerves grossly intact. moves all 4 extremities w/o difficulty. Affect pleasant.    Recent Labs: 12/26/2019: B Natriuretic Peptide 194.8; BUN 15; Creatinine, Ser 0.84; Hemoglobin 14.4; Platelets 244; Potassium 4.4; Sodium 141  Personally reviewed   Wt Readings from Last 3 Encounters:  07/02/20 90.8 kg (200 lb 3.2 oz)  12/26/19 94.3 kg (207 lb 12.8 oz)  11/02/19 91.2 kg (201 lb)      ASSESSMENT AND PLAN:  1. Biventricular Chronic Systolic HF  Suspect mixed ICM/NICM (?PVCs) - LHC 2019 90% LAD and 50% circumflex --> S/P CABG x2 LIMA to LAD and SVG to OM  - ECHO 11/2018 EF 10-15% moderate RV dysfunction.  - cMRI 11/2018  LVEF 21% Moderately reduced RV, moderateTR moderate to severe MR. No LGE - ECHO 02/2019 EF 20-25% RV severerly reduced - Echo 9/20 EF 20-25% severe MR - Echo 3/20 EF 20-25%, RV normal, mild MR   - Zio Patch 5/20 with 7.3% PVCs.  - s/p ST Jude  ICD - CPX test 9/20 with preserved functional capacity but low HR. Off ivabradine  - now s/p  COVID 8/21. W/ increased fatigue and dyspnea (gradually improving), NYHA II-III. Will  check repeat echo to ensure no further drop in EF - She is mildly fluid overloaded based on CorVue. Wt up + 5lb. Impedence appears to be trending back up. Advised to take Lasix 40 mg daily x 2 days. If wt back to baseline, she will then switch to PRN thereafter - Continue entresto 97/103 twice a day  - Continue spiro 25 daily  - Continue Farxiga 10mg  daily - Continue coreg 12.5 mg twice a day.   - Continue digoxin 0.125 mg daily. Check Dig level  - ICD interrogated in clinic. No VT or AF.   2. H/O bilateral PE 1/20 - Continue xarelto. On reduced dose,10 daily based on data from chronic prevention from [facility]  3. CAD S/P CABG 09/2018 - LIMA to the LAD and an SVG to the OM on 12/6/19with Dr Roxan Hockey - stable w/o CP  - Continue statin and  blocker - no ASA w/  xarelto use.   4. Fibromyalgia - stable  5. PVCs - Zio Patch 5/20 with 7.3% PVCs.  - Improved. PVC burden 1% on device interrogation   6. Snoring - Home sleep study 7/20. AHI 7.2 with desat down to 85%  7. Mitral Regurgitation  - likely due to LV dysfunction/dilation. - only mild on most recent echo 3/21   Plan to keep scheduled f/u w/ Dr. Haroldine Laws on 10/7. Will get repeat echo same day.     Nelida Gores  07/02/2020 12:18 PM   [address] [facility] [place] [address] [phone] (office) [phone] (fax)

## 2020-07-04 ENCOUNTER — Ambulatory Visit (INDEPENDENT_AMBULATORY_CARE_PROVIDER_SITE_OTHER): Payer: Medicare PPO | Admitting: *Deleted

## 2020-07-04 DIAGNOSIS — I255 Ischemic cardiomyopathy: Secondary | ICD-10-CM

## 2020-07-04 LAB — CUP PACEART REMOTE DEVICE CHECK
Battery Remaining Longevity: 88 mo
Battery Remaining Percentage: 90 %
Battery Voltage: 3.08 V
Brady Statistic AP VP Percent: 1 %
Brady Statistic AP VS Percent: 1 %
Brady Statistic AS VP Percent: 1 %
Brady Statistic AS VS Percent: 98 %
Brady Statistic RA Percent Paced: 1 %
Brady Statistic RV Percent Paced: 1 %
Date Time Interrogation Session: 20210917020021
HighPow Impedance: 79 Ohm
HighPow Impedance: 79 Ohm
Implantable Lead Implant Date: 20201007
Implantable Lead Implant Date: 20201007
Implantable Lead Location: 753859
Implantable Lead Location: 753860
Implantable Lead Model: 7122
Implantable Pulse Generator Implant Date: 20201007
Lead Channel Impedance Value: 450 Ohm
Lead Channel Impedance Value: 740 Ohm
Lead Channel Pacing Threshold Amplitude: 0.75 V
Lead Channel Pacing Threshold Amplitude: 0.75 V
Lead Channel Pacing Threshold Pulse Width: 0.5 ms
Lead Channel Pacing Threshold Pulse Width: 0.5 ms
Lead Channel Sensing Intrinsic Amplitude: 12 mV
Lead Channel Sensing Intrinsic Amplitude: 4.2 mV
Lead Channel Setting Pacing Amplitude: 2 V
Lead Channel Setting Pacing Amplitude: 2.5 V
Lead Channel Setting Pacing Pulse Width: 0.5 ms
Lead Channel Setting Sensing Sensitivity: 0.5 mV
Pulse Gen Serial Number: 9901215

## 2020-07-07 NOTE — Progress Notes (Signed)
Remote ICD transmission.   

## 2020-07-17 DIAGNOSIS — I10 Essential (primary) hypertension: Secondary | ICD-10-CM | POA: Diagnosis not present

## 2020-07-17 DIAGNOSIS — M545 Low back pain: Secondary | ICD-10-CM | POA: Diagnosis not present

## 2020-07-23 NOTE — Progress Notes (Signed)
Advanced Heart Failure Clinic Note  Date:  07/24/2020   ID:  ROSABEL SERMENO, DOB 1956-01-14, MRN [id number]  Location: Home  Provider location: [place] Advanced Heart Failure Type of Visit: Established patient   PCP:  Monico Blitz, MD  Cardiologist:  Kate Sable, MD (Inactive) Primary HF: Dr Haroldine Laws  EP: Dr Curt Bears  Chief Complaint: F/u for Chronic Systolic Heart Failure, S/p COVID   History of Present Illness:  SPARKLES MCNEELY is a 64 y.o. female with a history of systolic HF due to mixed ICM/NICM, DVT, PE, fibromyalgia.   Long h/o reported mild NICM EF 40-45%. Echo 11/19 EF 15-20%  Catheterization done12/05/19revealed 90% proximal LAD and a 50% circumflex stenosis. She underwent bypass grafting x2 with an LIMA to the LAD and an SVG to the OM on 09/22/18 with Dr Roxan Hockey.  Post CABG EF remained depressed.   -cMRI 2/20 LVEF 21% Moderately reduced RV, moderateTR. No LGE -ECHO 02/2019 EF 20-25% RV severerly reduced  - Echo 12/26/19 EF 20-25% RV normal.   Zio Patch 5/20 7.3 % PVCs 1. Predominant underlying rhythm was Sinus Rhythm. Minimum HR of 56 bpm, max HR of 176 bpm, and avg HR of 77 bpm. 2. Three runs of NSVT - longest 10 beats 3. Seven runs of SVT - longest lasting 9 beats   Started carvedilol 03/13/2019   Underwent SJ ICD 07/25/19   Here for routine f/u. Had [place] in 8/21 (un vaccinated) - did not get hospitalized. Feeling much better. Breathing better. Very active. Mowing the grass. Swimming in the pool. No CP or SOB. No orthopnea or PND. Mild fatigue at times.   ICD interrogated in clinic. No VT/AF. Volume looks ok. Personally reviewed   Cardiac studies:  CPX 07/09/19  FVC 2.09 (73%)    FEV1 1.47 (65%)     FEV1/FVC 70 (88%)     MVV 70 (81%)     Resting HR: 52 Standing HR:48 Peak HR: 102  (65% age predicted max HR)  BP rest: 84/60 Standing BP:90/60 BP peak: 124/60  Peak VO2: 16.0 (89% predicted peak VO2)  VE/VCO2 slope: 29  OUES:  1.81  Peak RER: 1.05  VE/MVV: 67%  Echo  02/19/2019 EF 20-25% RV severely reduced 11/28/2018 EF 10-15% with moderate RV dysfunction.  12/26/19 EF 20-25% RV normal.   cMRI 12/05/18 1. Moderately dilated LV with EF 21%, diffuse hypokinesis. 2. Normal RV size with moderately decreased systolic function, EF 41%. 3. Moderate TR, at least moderate central MR (likely functional). 4. No myocardial LGE, so no evidence for prior infarct, infiltrative disease, or myocarditis.  [facility] 12/05/18 On milrinone 0.125 RA = 8 RV = 37/10 PA = 39/15 (25) PCW = 19 (v = 35) Fick cardiac output/index = 5.0/2.8 PVR = 1.1 WU Ao sat = 94% PA sat = 66%, 66% PaPi = 3.0 RA/PCW = 0.42  Past Medical History:  Diagnosis Date  . Arthritis    "probably; maybe in my hands" (09/21/2018)  . Congestive heart failure (CHF) ([place])   . Dyspnea   . Fibromyalgia   . GERD (gastroesophageal reflux disease)   . Hypercholesteremia   . Hypertension   . IBS (irritable bowel syndrome)   . Mild sleep apnea    no CPAP (09/21/2018)  . Myocardial infarction [facility])    "was told I have had one; never knew about it" (09/21/2018)  . Nonischemic cardiomyopathy (HCC)    ECHO EF 40-45%, Septal Thickness,and mild global left ventricular dysfunction; stress test  03/2004 +VE, refersible defect; ECHO 03/2005 @[facility]  EF 55%  . Pulmonary embolus [facility])    Past Surgical History:  Procedure Laterality Date  . ANKLE FRACTURE SURGERY Left 2000  . BUNIONECTOMY Right 1990s?   "& removed part of my great toe due to fungus under nail"  . CARDIAC CATHETERIZATION  03/2005  . CORONARY ARTERY BYPASS GRAFT N/A 09/22/2018   Procedure: CORONARY ARTERY BYPASS GRAFTING (CABG), ON PUMP, TIMES TWO, USING LEFT INTERNAL MAMMARY ARTERY AND ENDOSCOPICALLY HARVESTED RIGHT GREATER SAPHENOUS VEIN;  Surgeon: Melrose Nakayama, MD;  Location: [place];  Service: Open Heart Surgery;  Laterality: N/A;  . DILATION AND CURETTAGE OF UTERUS    . FRACTURE SURGERY    . ICD  IMPLANT N/A 07/25/2019   Procedure: ICD IMPLANT;  Surgeon: Constance Haw, MD;  Location: [place] CV LAB;  Service: Cardiovascular;  Laterality: N/A;  . RIGHT HEART CATH N/A 12/05/2018   Procedure: RIGHT HEART CATH;  Surgeon: Jolaine Artist, MD;  Location: [place] CV LAB;  Service: Cardiovascular;  Laterality: N/A;  . RIGHT/LEFT HEART CATH AND CORONARY ANGIOGRAPHY N/A 09/21/2018   Procedure: RIGHT/LEFT HEART CATH AND CORONARY ANGIOGRAPHY;  Surgeon: Troy Sine, MD;  Location: [place] CV LAB;  Service: Cardiovascular;  Laterality: N/A;  . TEE WITHOUT CARDIOVERSION N/A 09/22/2018   Procedure: TRANSESOPHAGEAL ECHOCARDIOGRAM (TEE);  Surgeon: Melrose Nakayama, MD;  Location: [place];  Service: Open Heart Surgery;  Laterality: N/A;  . TUBAL LIGATION       Current Outpatient Medications  Medication Sig Dispense Refill  . albuterol (VENTOLIN HFA) 108 (90 Base) MCG/ACT inhaler     . Ascorbic Acid (VITAMIN C) 1000 MG tablet Take 1,000 mg by mouth daily.    Marland Kitchen aspirin EC 81 MG EC tablet Take 1 tablet (81 mg total) by mouth every evening. 30 tablet 0  . atorvastatin (LIPITOR) 80 MG tablet Take 1 tablet (80 mg total) by mouth daily at 6 PM. 30 tablet 1  . calamine lotion Apply 1 application topically as needed for itching.     . carvedilol (COREG) 12.5 MG tablet Take 1 tablet (12.5 mg total) by mouth 2 (two) times daily. 180 tablet 3  . cholecalciferol (VITAMIN D3) 25 MCG (1000 UT) tablet Take 1,000 Units by mouth daily.    . dapagliflozin propanediol (FARXIGA) 10 MG TABS tablet Take 10 mg by mouth daily before breakfast. 30 tablet 6  . digoxin (LANOXIN) 0.125 MG tablet TAKE 1 TABLET BY MOUTH DAILY 90 tablet 3  . DULoxetine (CYMBALTA) 60 MG capsule Take 60 mg by mouth 2 (two) times daily.    . furosemide (LASIX) 40 MG tablet Take 40 mg by mouth every other day. May take an additional tablet as needed for weight gain     . gabapentin (NEURONTIN) 300 MG capsule Take 600 mg by mouth  3 (three) times daily.    Marland Kitchen Phenylephrine-APAP-guaiFENesin (MUCINEX SINUS-MAX PO) Take 2 tablets by mouth daily as needed (sinus headaches).    . pramipexole (MIRAPEX) 0.5 MG tablet Take 0.5 mg by mouth at bedtime.     . sacubitril-valsartan (ENTRESTO) 97-103 MG Take 1 tablet by mouth 2 (two) times daily. 180 tablet 3  . spironolactone (ALDACTONE) 25 MG tablet Take 1 tablet (25 mg total) by mouth daily. 90 tablet 3  . vitamin B-12 (CYANOCOBALAMIN) 500 MCG tablet Take 1 tablet (500 mcg total) by mouth daily. 30 tablet 0  . XARELTO 20 MG TABS tablet  No current facility-administered medications for this encounter.    Allergies:   Poison oak extract   Social History:  The patient  reports that she quit smoking about 29 years ago. Her smoking use included cigarettes. She has a 30.00 pack-year smoking history. She has never used smokeless tobacco. She reports previous alcohol use. She reports that she does not use drugs.   Family History:  The patient's family history includes Alcohol abuse in her father; Colon cancer in her father; Congestive Heart Failure (age of onset: 7) in her brother; Depression in her father; Diabetes Mellitus II in her mother; Heart disease (age of onset: 64) in her mother.   ROS:  Please see the history of present illness.   All other systems are personally reviewed and negative.  Vitals:   07/24/20 1008  BP: 120/78  Pulse: 68  SpO2: 94%    PHYSICAL EXAM: General:  Well appearing. No resp difficulty HEENT: normal Neck: supple. no JVD. Carotids 2+ bilat; no bruits. No lymphadenopathy or thryomegaly appreciated. Cor: PMI nondisplaced. Regular rate & rhythm. No rubs, gallops or murmurs. Lungs: clear Abdomen: soft, nontender, nondistended. No hepatosplenomegaly. No bruits or masses. Good bowel sounds. Extremities: no cyanosis, clubbing, rash, edema Neuro: alert & orientedx3, cranial nerves grossly intact. moves all 4 extremities w/o difficulty. Affect  pleasant   Recent Labs: 07/02/2020: B Natriuretic Peptide 97.2; BUN 16; Creatinine, Ser 0.73; Hemoglobin 13.7; Platelets 270; Potassium 4.3; Sodium 139  Personally reviewed   Wt Readings from Last 3 Encounters:  07/24/20 92.1 kg (203 lb)  07/02/20 90.8 kg (200 lb 3.2 oz)  12/26/19 94.3 kg (207 lb 12.8 oz)     ASSESSMENT AND PLAN:  1. Biventricular Chronic Systolic HF  Suspect mixed ICM/NICM (?PVCs) - LHC 2019 90% LAD and 50% circumflex --> S/P CABG x2 LIMA to LAD and SVG to OM  - ECHO 11/2018 EF 10-15% moderate RV dysfunction.  - cMRI 11/2018  LVEF 21% Moderately reduced RV, moderateTR moderate to severe MR. No LGE - ECHO 02/2019 EF 20-25% RV severerly reduced - Echo 9/20 EF 20-25% severe MR - Echo 3/20 EF 20-25%, RV normal, mild MR   - Echo today 07/24/20 EF 25-30% RV normal mild MR.  - Zio Patch 5/20 with 7.3% PVCs.  - s/p ST Jude  ICD - CPX test 9/20 with preserved functional capacity but low HR. Off ivabradine  - NYHA II. Volume status looks great.  - On full GDMT - Continue entresto 97/103 twice a day  - Continue spiro 25 daily  - Continue Farxiga 10mg  daily - Continue coreg 12.5 mg twice a day.   - Stop digoxin - ICD interrogated in clinic as above. No VT/AF. Volume ok  - labs today  2. H/O bilateral PE 1/20 - Continue Xarelto. On reduced dose,10 daily based on data from chronic prevention from [place] - No bleeding  3. CAD S/P CABG 09/2018 - LIMA to the LAD and an SVG to the OM on 12/6/19with Dr Roxan Hockey - stable w/o CP - Continue statin and  blocker - no ASA with Xarelto use   4. PVCs - Zio Patch 5/20 with 7.3% PVCs.  - Improved. PVC burden 1% on device interrogation   5. Snoring - Home sleep study 7/20. AHI 7.2 with desat down to 85%  6. Mitral Regurgitation  - likely due to LV dysfunction/dilation. - on mild on echo today   Glori Bickers, MD  07/24/2020 10:51 AM  [place] [address]  [place] [address] [phone] (office) [phone] (fax)

## 2020-07-24 ENCOUNTER — Other Ambulatory Visit: Payer: Self-pay

## 2020-07-24 ENCOUNTER — Ambulatory Visit (HOSPITAL_BASED_OUTPATIENT_CLINIC_OR_DEPARTMENT_OTHER)
Admission: RE | Admit: 2020-07-24 | Discharge: 2020-07-24 | Disposition: A | Discharge status: Home / Self Care | Payer: Medicare PPO | Source: Ambulatory Visit

## 2020-07-24 ENCOUNTER — Ambulatory Visit (HOSPITAL_COMMUNITY)
Admission: RE | Admit: 2020-07-24 | Discharge: 2020-07-24 | Disposition: A | Discharge status: Home / Self Care | Payer: Medicare PPO | Source: Ambulatory Visit | Attending: Internal Medicine | Admitting: Internal Medicine

## 2020-07-24 VITALS — BP 120/78 | HR 68 | Wt 203.0 lb

## 2020-07-24 DIAGNOSIS — Z7984 Long term (current) use of oral hypoglycemic drugs: Secondary | ICD-10-CM | POA: Diagnosis not present

## 2020-07-24 DIAGNOSIS — I471 Supraventricular tachycardia: Secondary | ICD-10-CM | POA: Diagnosis not present

## 2020-07-24 DIAGNOSIS — I252 Old myocardial infarction: Secondary | ICD-10-CM | POA: Insufficient documentation

## 2020-07-24 DIAGNOSIS — I5042 Chronic combined systolic (congestive) and diastolic (congestive) heart failure: Secondary | ICD-10-CM | POA: Diagnosis not present

## 2020-07-24 DIAGNOSIS — I2699 Other pulmonary embolism without acute cor pulmonale: Secondary | ICD-10-CM | POA: Diagnosis not present

## 2020-07-24 DIAGNOSIS — Z79899 Other long term (current) drug therapy: Secondary | ICD-10-CM | POA: Insufficient documentation

## 2020-07-24 DIAGNOSIS — Z951 Presence of aortocoronary bypass graft: Secondary | ICD-10-CM | POA: Diagnosis not present

## 2020-07-24 DIAGNOSIS — I5043 Acute on chronic combined systolic (congestive) and diastolic (congestive) heart failure: Secondary | ICD-10-CM | POA: Diagnosis not present

## 2020-07-24 DIAGNOSIS — I110 Hypertensive heart disease with heart failure: Secondary | ICD-10-CM | POA: Insufficient documentation

## 2020-07-24 DIAGNOSIS — F329 Major depressive disorder, single episode, unspecified: Secondary | ICD-10-CM | POA: Diagnosis not present

## 2020-07-24 DIAGNOSIS — R0683 Snoring: Secondary | ICD-10-CM | POA: Diagnosis not present

## 2020-07-24 DIAGNOSIS — Z7901 Long term (current) use of anticoagulants: Secondary | ICD-10-CM | POA: Diagnosis not present

## 2020-07-24 DIAGNOSIS — I2510 Atherosclerotic heart disease of native coronary artery without angina pectoris: Secondary | ICD-10-CM | POA: Insufficient documentation

## 2020-07-24 DIAGNOSIS — I080 Rheumatic disorders of both mitral and aortic valves: Secondary | ICD-10-CM | POA: Diagnosis not present

## 2020-07-24 DIAGNOSIS — Z86718 Personal history of other venous thrombosis and embolism: Secondary | ICD-10-CM | POA: Insufficient documentation

## 2020-07-24 DIAGNOSIS — E7800 Pure hypercholesterolemia, unspecified: Secondary | ICD-10-CM | POA: Insufficient documentation

## 2020-07-24 DIAGNOSIS — Z86711 Personal history of pulmonary embolism: Secondary | ICD-10-CM | POA: Diagnosis not present

## 2020-07-24 DIAGNOSIS — I5022 Chronic systolic (congestive) heart failure: Secondary | ICD-10-CM

## 2020-07-24 DIAGNOSIS — I493 Ventricular premature depolarization: Secondary | ICD-10-CM | POA: Insufficient documentation

## 2020-07-24 DIAGNOSIS — Z8249 Family history of ischemic heart disease and other diseases of the circulatory system: Secondary | ICD-10-CM | POA: Diagnosis not present

## 2020-07-24 DIAGNOSIS — Z87891 Personal history of nicotine dependence: Secondary | ICD-10-CM | POA: Diagnosis not present

## 2020-07-24 DIAGNOSIS — Z7982 Long term (current) use of aspirin: Secondary | ICD-10-CM | POA: Diagnosis not present

## 2020-07-24 DIAGNOSIS — M797 Fibromyalgia: Secondary | ICD-10-CM | POA: Insufficient documentation

## 2020-07-24 LAB — BASIC METABOLIC PANEL
Anion gap: 7 (ref 5–15)
BUN: 12 mg/dL (ref 8–23)
CO2: 25 mmol/L (ref 22–32)
Calcium: 8.8 mg/dL — ABNORMAL LOW (ref 8.9–10.3)
Chloride: 109 mmol/L (ref 98–111)
Creatinine, Ser: 0.72 mg/dL (ref 0.44–1.00)
GFR calc non Af Amer: >60 mL/min (ref >60)
Glucose, Bld: 99 mg/dL (ref 70–99)
Potassium: 4.4 mmol/L (ref 3.5–5.1)
Sodium: 141 mmol/L (ref 135–145)

## 2020-07-24 LAB — ECHOCARDIOGRAM COMPLETE
Area-P 1/2: 2.95 cm2
S' Lateral: 4.6 cm

## 2020-07-24 LAB — BRAIN NATRIURETIC PEPTIDE: B Natriuretic Peptide: 128.2 pg/mL — ABNORMAL HIGH (ref 0.0–100.0)

## 2020-07-24 NOTE — Addendum Note (Signed)
Encounter addended by: Malena Edman, RN on: 07/24/2020 11:13 AM  Actions taken: Medication long-term status modified, Order list changed, Diagnosis association updated, Charge Capture section accepted, Clinical Note Signed

## 2020-07-24 NOTE — Progress Notes (Signed)
Echocardiogram 2D Echocardiogram has been performed.  [place] Deputy Markus Casten 07/24/2020, 9:53 AM

## 2020-07-24 NOTE — Patient Instructions (Signed)
STOP Digoxin  Labs done today, your results will be available in MyChart, we will contact you for abnormal readings.  Your physician recommends that you return for a follow up in 4 months  If you have any questions or concerns before your next appointment please send [place] a message through [place] or call our office at [phone].    TO LEAVE A MESSAGE FOR THE NURSE SELECT OPTION 2, PLEASE LEAVE A MESSAGE INCLUDING: . YOUR NAME . DATE OF BIRTH . CALL BACK NUMBER . REASON FOR CALL**this is important as we prioritize the call backs  YOU WILL RECEIVE A CALL BACK THE SAME DAY AS LONG AS YOU CALL BEFORE 4:00 PM

## 2020-08-15 DIAGNOSIS — I10 Essential (primary) hypertension: Secondary | ICD-10-CM | POA: Diagnosis not present

## 2020-08-15 DIAGNOSIS — M545 Low back pain, unspecified: Secondary | ICD-10-CM | POA: Diagnosis not present

## 2020-09-09 MED ORDER — ATORVASTATIN CALCIUM 10 MG PO TABS
ORAL_TABLET | 0 refills | Status: AC
Start: 2020-09-09 — End: ?

## 2020-09-30 ENCOUNTER — Other Ambulatory Visit (HOSPITAL_COMMUNITY): Payer: Self-pay | Admitting: Internal Medicine

## 2020-10-03 ENCOUNTER — Ambulatory Visit (INDEPENDENT_AMBULATORY_CARE_PROVIDER_SITE_OTHER): Payer: Medicare PPO

## 2020-10-03 DIAGNOSIS — I255 Ischemic cardiomyopathy: Secondary | ICD-10-CM

## 2020-10-06 LAB — CUP PACEART REMOTE DEVICE CHECK
Battery Remaining Longevity: 86 mo
Battery Remaining Percentage: 87 %
Battery Voltage: 3.05 V
Brady Statistic AP VP Percent: 1 %
Brady Statistic AP VS Percent: 1 %
Brady Statistic AS VP Percent: 1 %
Brady Statistic AS VS Percent: 98 %
Brady Statistic RA Percent Paced: 1 %
Brady Statistic RV Percent Paced: 1 %
Date Time Interrogation Session: 20211217034648
HighPow Impedance: 78 Ohm
HighPow Impedance: 78 Ohm
Implantable Lead Implant Date: 20201007
Implantable Lead Implant Date: 20201007
Implantable Lead Location: 753859
Implantable Lead Location: 753860
Implantable Lead Model: 7122
Implantable Pulse Generator Implant Date: 20201007
Lead Channel Impedance Value: 460 Ohm
Lead Channel Impedance Value: 730 Ohm
Lead Channel Pacing Threshold Amplitude: 0.75 V
Lead Channel Pacing Threshold Amplitude: 0.75 V
Lead Channel Pacing Threshold Pulse Width: 0.5 ms
Lead Channel Pacing Threshold Pulse Width: 0.5 ms
Lead Channel Sensing Intrinsic Amplitude: 12 mV
Lead Channel Sensing Intrinsic Amplitude: 3.9 mV
Lead Channel Setting Pacing Amplitude: 2 V
Lead Channel Setting Pacing Amplitude: 2.5 V
Lead Channel Setting Pacing Pulse Width: 0.5 ms
Lead Channel Setting Sensing Sensitivity: 0.5 mV
Pulse Gen Serial Number: 9901215

## 2020-10-08 DIAGNOSIS — Z7189 Other specified counseling: Secondary | ICD-10-CM | POA: Diagnosis not present

## 2020-10-08 DIAGNOSIS — M79641 Pain in right hand: Secondary | ICD-10-CM | POA: Diagnosis not present

## 2020-10-08 DIAGNOSIS — I10 Essential (primary) hypertension: Secondary | ICD-10-CM | POA: Diagnosis not present

## 2020-10-08 DIAGNOSIS — Z1339 Encounter for screening examination for other mental health and behavioral disorders: Secondary | ICD-10-CM | POA: Diagnosis not present

## 2020-10-08 DIAGNOSIS — Z1331 Encounter for screening for depression: Secondary | ICD-10-CM | POA: Diagnosis not present

## 2020-10-08 DIAGNOSIS — Z6836 Body mass index (BMI) 36.0-36.9, adult: Secondary | ICD-10-CM | POA: Diagnosis not present

## 2020-10-08 DIAGNOSIS — Z0000 Encounter for general adult medical examination without abnormal findings: Secondary | ICD-10-CM | POA: Diagnosis not present

## 2020-10-08 DIAGNOSIS — Z299 Encounter for prophylactic measures, unspecified: Secondary | ICD-10-CM | POA: Diagnosis not present

## 2020-10-16 NOTE — Progress Notes (Signed)
Remote ICD transmission.   

## 2020-10-17 DIAGNOSIS — I10 Essential (primary) hypertension: Secondary | ICD-10-CM | POA: Diagnosis not present

## 2020-10-17 DIAGNOSIS — M545 Low back pain, unspecified: Secondary | ICD-10-CM | POA: Diagnosis not present

## 2020-11-04 DIAGNOSIS — E2839 Other primary ovarian failure: Secondary | ICD-10-CM | POA: Diagnosis not present

## 2020-11-04 DIAGNOSIS — M818 Other osteoporosis without current pathological fracture: Secondary | ICD-10-CM | POA: Diagnosis not present

## 2020-11-11 DIAGNOSIS — Z299 Encounter for prophylactic measures, unspecified: Secondary | ICD-10-CM | POA: Diagnosis not present

## 2020-11-11 DIAGNOSIS — Z789 Other specified health status: Secondary | ICD-10-CM | POA: Diagnosis not present

## 2020-11-11 DIAGNOSIS — I10 Essential (primary) hypertension: Secondary | ICD-10-CM | POA: Diagnosis not present

## 2020-11-11 DIAGNOSIS — Z6836 Body mass index (BMI) 36.0-36.9, adult: Secondary | ICD-10-CM | POA: Diagnosis not present

## 2020-11-11 DIAGNOSIS — I255 Ischemic cardiomyopathy: Secondary | ICD-10-CM | POA: Diagnosis not present

## 2020-11-11 DIAGNOSIS — I2699 Other pulmonary embolism without acute cor pulmonale: Secondary | ICD-10-CM | POA: Diagnosis not present

## 2020-11-11 DIAGNOSIS — I509 Heart failure, unspecified: Secondary | ICD-10-CM | POA: Diagnosis not present

## 2020-11-15 ENCOUNTER — Other Ambulatory Visit: Payer: Self-pay | Admitting: Internal Medicine

## 2020-11-17 DIAGNOSIS — M545 Low back pain, unspecified: Secondary | ICD-10-CM | POA: Diagnosis not present

## 2020-11-17 DIAGNOSIS — I10 Essential (primary) hypertension: Secondary | ICD-10-CM | POA: Diagnosis not present

## 2020-11-24 ENCOUNTER — Encounter (HOSPITAL_COMMUNITY): Payer: Self-pay | Admitting: Internal Medicine

## 2020-11-24 ENCOUNTER — Ambulatory Visit (HOSPITAL_COMMUNITY)
Admission: RE | Admit: 2020-11-24 | Discharge: 2020-11-24 | Disposition: A | Discharge status: Home / Self Care | Payer: Medicare PPO | Source: Ambulatory Visit | Attending: Internal Medicine | Admitting: Internal Medicine

## 2020-11-24 ENCOUNTER — Other Ambulatory Visit: Payer: Self-pay

## 2020-11-24 VITALS — BP 110/76 | HR 79 | Wt 219.4 lb

## 2020-11-24 DIAGNOSIS — I5022 Chronic systolic (congestive) heart failure: Secondary | ICD-10-CM

## 2020-11-24 DIAGNOSIS — Z7901 Long term (current) use of anticoagulants: Secondary | ICD-10-CM | POA: Diagnosis not present

## 2020-11-24 DIAGNOSIS — Z79899 Other long term (current) drug therapy: Secondary | ICD-10-CM | POA: Diagnosis not present

## 2020-11-24 DIAGNOSIS — I2510 Atherosclerotic heart disease of native coronary artery without angina pectoris: Secondary | ICD-10-CM

## 2020-11-24 DIAGNOSIS — Z86718 Personal history of other venous thrombosis and embolism: Secondary | ICD-10-CM | POA: Diagnosis not present

## 2020-11-24 DIAGNOSIS — Z87891 Personal history of nicotine dependence: Secondary | ICD-10-CM | POA: Diagnosis not present

## 2020-11-24 DIAGNOSIS — I428 Other cardiomyopathies: Secondary | ICD-10-CM | POA: Diagnosis not present

## 2020-11-24 DIAGNOSIS — Z7984 Long term (current) use of oral hypoglycemic drugs: Secondary | ICD-10-CM | POA: Insufficient documentation

## 2020-11-24 DIAGNOSIS — I110 Hypertensive heart disease with heart failure: Secondary | ICD-10-CM | POA: Insufficient documentation

## 2020-11-24 DIAGNOSIS — E7800 Pure hypercholesterolemia, unspecified: Secondary | ICD-10-CM | POA: Insufficient documentation

## 2020-11-24 DIAGNOSIS — R0683 Snoring: Secondary | ICD-10-CM | POA: Diagnosis not present

## 2020-11-24 DIAGNOSIS — I255 Ischemic cardiomyopathy: Secondary | ICD-10-CM | POA: Insufficient documentation

## 2020-11-24 DIAGNOSIS — Z86711 Personal history of pulmonary embolism: Secondary | ICD-10-CM | POA: Diagnosis not present

## 2020-11-24 DIAGNOSIS — I252 Old myocardial infarction: Secondary | ICD-10-CM | POA: Diagnosis not present

## 2020-11-24 DIAGNOSIS — Z95810 Presence of automatic (implantable) cardiac defibrillator: Secondary | ICD-10-CM | POA: Insufficient documentation

## 2020-11-24 DIAGNOSIS — Z7982 Long term (current) use of aspirin: Secondary | ICD-10-CM | POA: Insufficient documentation

## 2020-11-24 DIAGNOSIS — Z8249 Family history of ischemic heart disease and other diseases of the circulatory system: Secondary | ICD-10-CM | POA: Insufficient documentation

## 2020-11-24 DIAGNOSIS — Z951 Presence of aortocoronary bypass graft: Secondary | ICD-10-CM | POA: Diagnosis not present

## 2020-11-24 DIAGNOSIS — I340 Nonrheumatic mitral (valve) insufficiency: Secondary | ICD-10-CM | POA: Insufficient documentation

## 2020-11-24 DIAGNOSIS — I2699 Other pulmonary embolism without acute cor pulmonale: Secondary | ICD-10-CM | POA: Diagnosis not present

## 2020-11-24 DIAGNOSIS — I493 Ventricular premature depolarization: Secondary | ICD-10-CM | POA: Diagnosis not present

## 2020-11-24 LAB — BASIC METABOLIC PANEL
Anion gap: 11 (ref 5–15)
BUN: 20 mg/dL (ref 8–23)
CO2: 26 mmol/L (ref 22–32)
Calcium: 9.2 mg/dL (ref 8.9–10.3)
Chloride: 104 mmol/L (ref 98–111)
Creatinine, Ser: 1.01 mg/dL — ABNORMAL HIGH (ref 0.44–1.00)
GFR, Estimated: >60 mL/min (ref >60)
Glucose, Bld: 104 mg/dL — ABNORMAL HIGH (ref 70–99)
Potassium: 5 mmol/L (ref 3.5–5.1)
Sodium: 141 mmol/L (ref 135–145)

## 2020-11-24 LAB — CBC
HCT: 46.9 % — ABNORMAL HIGH (ref 36.0–46.0)
Hemoglobin: 15.9 g/dL — ABNORMAL HIGH (ref 12.0–15.0)
MCH: 33.4 pg (ref 26.0–34.0)
MCHC: 33.9 g/dL (ref 30.0–36.0)
MCV: 98.5 fL (ref 80.0–100.0)
Platelets: 259 10*3/uL (ref 150–400)
RBC: 4.76 MIL/uL (ref 3.87–5.11)
RDW: 11.6 % (ref 11.5–15.5)
WBC: 7.1 10*3/uL (ref 4.0–10.5)
nRBC: 0 % (ref 0.0–0.2)

## 2020-11-24 LAB — BRAIN NATRIURETIC PEPTIDE: B Natriuretic Peptide: 108.5 pg/mL — ABNORMAL HIGH (ref 0.0–100.0)

## 2020-11-24 MED ORDER — CARVEDILOL 12.5 MG PO TABS
18.7500 mg | ORAL_TABLET | Freq: Two times a day (BID) | ORAL | 3 refills | Status: DC
Start: 2020-11-24 — End: 2021-04-09

## 2020-11-24 NOTE — Progress Notes (Signed)
Advanced Heart Failure Clinic Note  Date:  11/24/2020   ID:  Jennifer [name], DOB 1956/04/07, MRN [id number]  Location: Home  Provider location: [facility] Type of Visit: Established patient   PCP:  Monico Blitz, MD  Cardiologist:  Kate Sable, MD (Inactive) Primary HF: Dr Haroldine Laws  EP: Dr Curt Bears  Chief Complaint: F/u for Chronic Systolic Heart Failure, S/p COVID   History of Present Illness:  Jennifer [name] is a 65 y.o. female with a history of systolic HF due to mixed ICM/NICM, DVT, PE, fibromyalgia.   Long h/o reported mild NICM EF 40-45%. Echo 11/19 EF 15-20%  Catheterization done12/05/19revealed 90% proximal LAD and a 50% circumflex stenosis. She underwent bypass grafting x2 with an LIMA to the LAD and an SVG to the OM on 09/22/18 with Dr Roxan Hockey.  Post CABG EF remained depressed.   -cMRI 2/20 LVEF 21% Moderately reduced RV, moderateTR. No LGE -ECHO 02/2019 EF 20-25% RV severerly reduced  - Echo 12/26/19 EF 20-25% RV normal.   Zio Patch 5/20 7.3 % PVCs 1. Predominant underlying rhythm was Sinus Rhythm. Minimum HR of 56 bpm, max HR of 176 bpm, and avg HR of 77 bpm. 2. Three runs of NSVT - longest 10 beats 3. Seven runs of SVT - longest lasting 9 beats   Started carvedilol 03/13/2019   Underwent SJ ICD 07/25/19   Here for routine f/u. Had [place] in 8/21 (un vaccinated) - did not get hospitalized. Feels really good. Still working with her horses on the farm. Denies SOB, orthopnea or PND. No edema. No problems with meds.   ICD interrogated in clinic. No VT. Occasional brief mode switches. Volume ok. Activity 4hr/day Personally reviewed    Cardiac studies:  CPX 07/09/19  FVC 2.09 (73%)    FEV1 1.47 (65%)     FEV1/FVC 70 (88%)     MVV 70 (81%)     Resting HR: 52 Standing HR:48 Peak HR: 102  (65% age predicted max HR)  BP rest: 84/60 Standing BP:90/60 BP peak: 124/60  Peak VO2: 16.0 (89% predicted peak VO2)  VE/VCO2  slope: 29  OUES: 1.81  Peak RER: 1.05  VE/MVV: 67%  Echo  02/19/2019 EF 20-25% RV severely reduced 11/28/2018 EF 10-15% with moderate RV dysfunction.  12/26/19 EF 20-25% RV normal.   cMRI 12/05/18 1. Moderately dilated LV with EF 21%, diffuse hypokinesis. 2. Normal RV size with moderately decreased systolic function, EF 123456. 3. Moderate TR, at least moderate central MR (likely functional). 4. No myocardial LGE, so no evidence for prior infarct, infiltrative disease, or myocarditis.  RHC 12/05/18 On milrinone 0.125 RA = 8 RV = 37/10 PA = 39/15 (25) PCW = 19 (v = 35) Fick cardiac output/index = 5.0/2.8 PVR = 1.1 WU Ao sat = 94% PA sat = 66%, 66% PaPi = 3.0 RA/PCW = 0.42  Past Medical History:  Diagnosis Date  . Arthritis    "probably; maybe in my hands" (09/21/2018)  . Congestive heart failure (CHF) ([facility])   . Dyspnea   . Fibromyalgia   . GERD (gastroesophageal reflux disease)   . Hypercholesteremia   . Hypertension   . IBS (irritable bowel syndrome)   . Mild sleep apnea    no CPAP (09/21/2018)  . Myocardial infarction [facility])    "was told I have had one; never knew about it" (09/21/2018)  . Nonischemic cardiomyopathy (HCC)    ECHO EF 40-45%, Septal Thickness,and mild global left ventricular dysfunction;  stress test 03/2004 +VE, refersible defect; ECHO 03/2005 @[facility]  EF 55%  . Pulmonary embolus [facility])    Past Surgical History:  Procedure Laterality Date  . ANKLE FRACTURE SURGERY Left 2000  . BUNIONECTOMY Right 1990s?   "& removed part of my great toe due to fungus under nail"  . CARDIAC CATHETERIZATION  03/2005  . CORONARY ARTERY BYPASS GRAFT N/A 09/22/2018   Procedure: CORONARY ARTERY BYPASS GRAFTING (CABG), ON PUMP, TIMES TWO, USING LEFT INTERNAL MAMMARY ARTERY AND ENDOSCOPICALLY HARVESTED RIGHT GREATER SAPHENOUS VEIN;  Surgeon: Melrose Nakayama, MD;  Location: [facility];  Service: Open Heart Surgery;  Laterality: N/A;  . DILATION AND CURETTAGE OF UTERUS    . FRACTURE  SURGERY    . ICD IMPLANT N/A 07/25/2019   Procedure: ICD IMPLANT;  Surgeon: Constance Haw, MD;  Location: [place] CV LAB;  Service: Cardiovascular;  Laterality: N/A;  . RIGHT HEART CATH N/A 12/05/2018   Procedure: RIGHT HEART CATH;  Surgeon: Jolaine Artist, MD;  Location: [place] CV LAB;  Service: Cardiovascular;  Laterality: N/A;  . RIGHT/LEFT HEART CATH AND CORONARY ANGIOGRAPHY N/A 09/21/2018   Procedure: RIGHT/LEFT HEART CATH AND CORONARY ANGIOGRAPHY;  Surgeon: Troy Sine, MD;  Location: [facility] CV LAB;  Service: Cardiovascular;  Laterality: N/A;  . TEE WITHOUT CARDIOVERSION N/A 09/22/2018   Procedure: TRANSESOPHAGEAL ECHOCARDIOGRAM (TEE);  Surgeon: Melrose Nakayama, MD;  Location: [facility];  Service: Open Heart Surgery;  Laterality: N/A;  . TUBAL LIGATION       Current Outpatient Medications  Medication Sig Dispense Refill  . albuterol (VENTOLIN HFA) 108 (90 Base) MCG/ACT inhaler     . Ascorbic Acid (VITAMIN C) 1000 MG tablet Take 1,000 mg by mouth daily.    Marland Kitchen aspirin EC 81 MG EC tablet Take 1 tablet (81 mg total) by mouth every evening. 30 tablet 0  . atorvastatin (LIPITOR) 80 MG tablet Take 1 tablet (80 mg total) by mouth daily at 6 PM. 30 tablet 1  . calamine lotion Apply 1 application topically as needed for itching.     . carvedilol (COREG) 12.5 MG tablet Take 1 tablet (12.5 mg total) by mouth 2 (two) times daily. 180 tablet 3  . cholecalciferol (VITAMIN D3) 25 MCG (1000 UT) tablet Take 1,000 Units by mouth daily.    . dapagliflozin propanediol (FARXIGA) 10 MG TABS tablet Take 10 mg by mouth daily before breakfast. 30 tablet 6  . DULoxetine (CYMBALTA) 60 MG capsule Take 60 mg by mouth 2 (two) times daily.    . furosemide (LASIX) 40 MG tablet Take 40 mg by mouth every other day. May take an additional tablet as needed for weight gain     . gabapentin (NEURONTIN) 300 MG capsule Take 600 mg by mouth 3 (three) times daily.    Marland Kitchen Phenylephrine-APAP-guaiFENesin  (MUCINEX SINUS-MAX PO) Take 2 tablets by mouth daily as needed (sinus headaches).    . pramipexole (MIRAPEX) 0.5 MG tablet Take 0.5 mg by mouth at bedtime.     . sacubitril-valsartan (ENTRESTO) 97-103 MG Take 1 tablet by mouth 2 (two) times daily. 180 tablet 3  . spironolactone (ALDACTONE) 25 MG tablet TAKE 1 TABLET (25 MG TOTAL) BY MOUTH DAILY. 90 tablet 3  . vitamin B-12 (CYANOCOBALAMIN) 500 MCG tablet Take 1 tablet (500 mcg total) by mouth daily. 30 tablet 0  . XARELTO 20 MG TABS tablet      No current facility-administered medications for this encounter.    Allergies:  Poison oak extract   Social History:  The patient  reports that she quit smoking about 30 years ago. Her smoking use included cigarettes. She has a 30.00 pack-year smoking history. She has never used smokeless tobacco. She reports previous alcohol use. She reports that she does not use drugs.   Family History:  The patient's family history includes Alcohol abuse in her father; Colon cancer in her father; Congestive Heart Failure (age of onset: 64) in her brother; Depression in her father; Diabetes Mellitus II in her mother; Heart disease (age of onset: 14) in her mother.   ROS:  Please see the history of present illness.   All other systems are personally reviewed and negative.  Vitals:   11/24/20 1409  BP: 110/76  Pulse: 79  SpO2: 94%    PHYSICAL EXAM: General:  Well appearing. No resp difficulty HEENT: normal Neck: supple. no JVD. Carotids 2+ bilat; no bruits. No lymphadenopathy or thryomegaly appreciated. Cor: PMI nondisplaced. Regular rate & rhythm. No rubs, gallops or murmurs. Lungs: clear Abdomen: obese soft, nontender, nondistended. No hepatosplenomegaly. No bruits or masses. Good bowel sounds. Extremities: no cyanosis, clubbing, rash, edema Neuro: alert & orientedx3, cranial nerves grossly intact. moves all 4 extremities w/o difficulty. Affect pleasant   Recent Labs: 07/02/2020: Hemoglobin 13.7;  Platelets 270 07/24/2020: B Natriuretic Peptide 128.2; BUN 12; Creatinine, Ser 0.72; Potassium 4.4; Sodium 141  Personally reviewed   Wt Readings from Last 3 Encounters:  11/24/20 99.5 kg (219 lb 6.4 oz)  07/24/20 92.1 kg (203 lb)  07/02/20 90.8 kg (200 lb 3.2 oz)     ASSESSMENT AND PLAN:  1. Biventricular Chronic Systolic HF  Suspect mixed ICM/NICM (?PVCs) - LHC 2019 90% LAD and 50% circumflex --> S/P CABG x2 LIMA to LAD and SVG to OM  - ECHO 11/2018 EF 10-15% moderate RV dysfunction.  - cMRI 11/2018  LVEF 21% Moderately reduced RV, moderateTR moderate to severe MR. No LGE - ECHO 02/2019 EF 20-25% RV severerly reduced - Echo 9/20 EF 20-25% severe MR - Echo 3/20 EF 20-25%, RV normal, mild MR   - Echo  07/24/20 EF 25-30% RV normal mild MR.  - Zio Patch 5/20 with 7.3% PVCs.  - s/p ST Jude  ICD - CPX test 9/20 with preserved functional capacity but low HR. Off ivabradine  - Stable NYHA II. Volume status looks good.   - On full GDMT - Continue entresto 97/103 twice a day  - Continue spiro 25 daily  - Continue Farxiga 10mg  daily - Increase coreg to 18.75 mg twice a day.   - ICD interrogated personally as above  - Labs today  2. H/O bilateral PE 1/20 - Continue Xarelto. On reduced dose,10 daily based on data from chronic prevention from [place] - No bleeding   3. CAD S/P CABG 09/2018 - LIMA to the LAD and an SVG to the OM on 12/6/19with Dr Roxan Hockey - No s/s angina - Continue statin and  blocker - no ASA with Xarelto use   4. PVCs - Zio Patch 5/20 with 7.3% PVCs.  - Improved. PVC burden 1% on device interrogation   5. Snoring - Home sleep study 7/20. AHI 7.2 with desat down to 85%  6. Mitral Regurgitation  - likely due to LV dysfunction/dilation. - mild on recent echo   Glori Bickers, MD  11/24/2020 2:27 PM  [facility] [address] [facility] and [place] [address] [phone] (office) [phone] (fax)

## 2020-11-24 NOTE — Addendum Note (Signed)
Encounter addended by: Jolaine Artist, MD on: 11/24/2020 2:59 PM  Actions taken: Charge Capture section accepted

## 2020-11-24 NOTE — Patient Instructions (Signed)
Increase Coreg to 18.75 mg (1 1/2 tablets) Twice daily   Labs done today, your results will be available in MyChart, we will contact you for abnormal readings.  Please call our office in July 2022 to schedule an appointment  If you have any questions or concerns before your next appointment please send [place] a message through [place] or call our office at [phone].    TO LEAVE A MESSAGE FOR THE NURSE SELECT OPTION 2, PLEASE LEAVE A MESSAGE INCLUDING: . YOUR NAME . DATE OF BIRTH . CALL BACK NUMBER . REASON FOR CALL**this is important as we prioritize the call backs  [place] AS LONG AS YOU CALL BEFORE 4:00 PM  At the [facility], you and your health needs are our priority. As part of our continuing mission to provide you with exceptional heart care, we have created designated Provider Care Teams. These Care Teams include your primary Cardiologist (physician) and Advanced Practice Providers (APPs- Physician Assistants and Nurse Practitioners) who all work together to provide you with the care you need, when you need it.   You may see any of the following providers on your designated Care Team at your next follow up: Marland Kitchen Dr Glori Bickers . Dr Loralie Champagne . Darrick Grinder, NP . Lyda Jester, [place] . Audry Riles, PharmD   Please be sure to bring in all your medications bottles to every appointment.

## 2020-11-24 NOTE — Addendum Note (Signed)
Encounter addended by: Stanford Scotland, RN on: 11/24/2020 2:44 PM  Actions taken: Pharmacy for encounter modified, Order list changed, Diagnosis association updated, Clinical Note Signed

## 2020-11-28 ENCOUNTER — Telehealth (HOSPITAL_COMMUNITY): Payer: Self-pay | Admitting: Pharmacy Technician

## 2020-11-28 NOTE — Telephone Encounter (Signed)
Received a [employer] and AZ&Me application for the patient.  Both applications sent via fax.

## 2020-12-02 MED ORDER — ATORVASTATIN CALCIUM 10 MG PO TABS
ORAL_TABLET | 0 refills
Start: 2020-12-02 — End: ?

## 2020-12-03 MED ORDER — ATORVASTATIN CALCIUM 10 MG PO TABS
ORAL_TABLET | 0 refills | Status: AC
Start: 2020-12-03 — End: ?

## 2020-12-09 NOTE — Telephone Encounter (Addendum)
Advanced Heart Failure Patient Advocate Encounter   Patient was approved to receive Entresto from [employer]  Patient ID: [id number] Effective dates: 12/04/20 through 10/17/21  Called and left the patient a message regarding approval.  Charlann Boxer, CPhT

## 2021-01-01 MED ORDER — ATORVASTATIN CALCIUM 10 MG PO TABS
ORAL_TABLET | 0 refills | Status: AC
Start: 2021-01-01 — End: ?

## 2021-01-02 ENCOUNTER — Ambulatory Visit (INDEPENDENT_AMBULATORY_CARE_PROVIDER_SITE_OTHER): Payer: Medicare PPO

## 2021-01-02 DIAGNOSIS — I255 Ischemic cardiomyopathy: Secondary | ICD-10-CM | POA: Diagnosis not present

## 2021-01-02 LAB — CUP PACEART REMOTE DEVICE CHECK
Battery Remaining Longevity: 83 mo
Battery Remaining Percentage: 85 %
Battery Voltage: 3.02 V
Brady Statistic AP VP Percent: 1 %
Brady Statistic AP VS Percent: 1 %
Brady Statistic AS VP Percent: 1 %
Brady Statistic AS VS Percent: 98 %
Brady Statistic RA Percent Paced: 1 %
Brady Statistic RV Percent Paced: 1 %
Date Time Interrogation Session: 20220318052446
HighPow Impedance: 80 Ohm
HighPow Impedance: 80 Ohm
Implantable Lead Implant Date: 20201007
Implantable Lead Implant Date: 20201007
Implantable Lead Location: 753859
Implantable Lead Location: 753860
Implantable Lead Model: 7122
Implantable Pulse Generator Implant Date: 20201007
Lead Channel Impedance Value: 460 Ohm
Lead Channel Impedance Value: 710 Ohm
Lead Channel Pacing Threshold Amplitude: 0.75 V
Lead Channel Pacing Threshold Amplitude: 0.75 V
Lead Channel Pacing Threshold Pulse Width: 0.5 ms
Lead Channel Pacing Threshold Pulse Width: 0.5 ms
Lead Channel Sensing Intrinsic Amplitude: 12 mV
Lead Channel Sensing Intrinsic Amplitude: 3.1 mV
Lead Channel Setting Pacing Amplitude: 2 V
Lead Channel Setting Pacing Amplitude: 2.5 V
Lead Channel Setting Pacing Pulse Width: 0.5 ms
Lead Channel Setting Sensing Sensitivity: 0.5 mV
Pulse Gen Serial Number: 9901215

## 2021-01-09 NOTE — Progress Notes (Signed)
Remote ICD transmission.   

## 2021-01-14 DIAGNOSIS — E559 Vitamin D deficiency, unspecified: Secondary | ICD-10-CM | POA: Diagnosis not present

## 2021-01-14 DIAGNOSIS — I10 Essential (primary) hypertension: Secondary | ICD-10-CM | POA: Diagnosis not present

## 2021-01-21 ENCOUNTER — Ambulatory Visit: Payer: PRIVATE HEALTH INSURANCE

## 2021-01-21 DIAGNOSIS — F5101 Primary insomnia: Secondary | ICD-10-CM

## 2021-01-21 NOTE — Progress Notes
PATIENT:  Kerry Baxter   MRN:  [id number]  DOB:  [birth year]  DATE OF SERVICE:  01/21/2021  INITIAL MEDICARE EXAM      PRIMARY CARE PROVIDER: Gregary Cromer., MD    CC:   Chief Complaint   Patient presents with   ??? Annual Exam        Subjective:     HPI: Kerry Baxter is a 65 y.o. female presenting today for     ANNUAL MEDICARE WELLNESS VISIT INCLUDED REVIEW OF ALL CHRONIC MEDICAL CONDITIONS, ACUTE MEDICAL ISSUES, ADL, IADL, FALL RISK, HOME SAFETY, MENTAL HEALTH REVIEW, NUTRITIONAL REVIEW.  SEE MY DOCUMENTATION ABOVE AND NURSING NOTES  FOR EXTRAPOLATED DETAILS.   No additional contributory past medical, surgical, family or social history except as documented in care connect.   ROS: 14-Review of Systems - negative    Allergies   Allergen Reactions   ??? Sulfa Antibiotics      Rash         Medications:  Outpatient Medications Prior to Visit   Medication Sig   ??? ATORVASTATIN 10 mg tablet TAKE 1 TABLET BY MOUTH EVERY DAY   ??? fluticasone (FLONASE ALLERGY RELIEF) 50 mcg/act nasal spray      No facility-administered medications prior to visit.         Past Medical History:  Past Medical History:   Diagnosis Date   ??? End of life care 01/01/2019    ADVANCE CARE DIRECTIVE REVIEWED/ FORMS GIVEN WILL REVIEW AT NEXT VISIT    ??? Hyperlipidemia 11/28/2012   ??? Insomnia      Patient Active Problem List   Diagnosis   ??? Hyperlipidemia   ??? Routine general medical examination at a health care facility   ??? Preventative health care   ??? SK (seborrheic keratosis)   ??? End of life care         Past Surgical History:  Past Surgical History:   Procedure Laterality Date   ??? COLONOSCOPY  01/07/2012    Procedure: COLONOSCOPY;  Surgeon: Joslyn Devon, MD;  Location: [facility];  Service: Gastroenterology;  Laterality: N/A;  scope C#11   ??? UPPER GASTROINTESTINAL ENDOSCOPY  01/07/2012    Procedure: UPPER GI ENDOSCOPY;  Surgeon: Joslyn Devon, MD;  Location: [facility];  Service: Gastroenterology;  Laterality: N/A;  Scope E# 6       Social History:  Social History Socioeconomic History   ??? Marital status: Married     Spouse name: Keith Rake   ??? Number of children: 1   ??? Years of education: masters   ??? Highest education level: Not on file   Occupational History   ??? Occupation: Scientist, physiological   Tobacco Use   ??? Smoking status: Never Smoker   ??? Smokeless tobacco: Never Used   Substance and Sexual Activity   ??? Alcohol use: Yes     Alcohol/week: 4.8 - 6.0 oz     Types: 8 - 10 Glasses of Wine (5 oz) per week     Comment: mild   ??? Drug use: No   ??? Sexual activity: Yes     Partners: Male     Birth control/protection: None   Other Topics Concern   ??? Do you exercise at least a day, 3 or more days a week? Yes   ??? Types of Exercise? (List in Comments) Yes     Comment: gym   ??? Do you follow a special diet? No   ??? Vegan? Not Asked   ???  Vegetarian? Not Asked   ??? Pescatarian? Not Asked   ??? Lactose Free? Not Asked   ??? Gluten Free? Not Asked   ??? Omnivore? Not Asked   Social History Narrative    Diet; Balanced     Social Determinants of Health     Financial Resource Strain: Low Risk    ??? Difficulty of Paying Living Expenses: Not hard at all   Physical Activity: Sufficiently Active   ??? Days of Exercise per Week: 6 days   ??? Minutes of Exercise per Session: 40 min   Stress: Stress Concern Present   ??? Feeling of Stress : Very much       Family History:   Family History   Problem Relation Age of Onset   ??? Skin cancer Mother    ??? Heart disease Mother         VALVE   ??? Arthritis Sister    ??? Diabetes Other         Family Hx   ??? Mental illness Other         Family Hx   ??? Asthma Other         Family Hx   ??? Allergies Other         Family Hx       Health Maintenance:   Vaccines:   Immunization History   Administered Date(s) Administered   ??? COVID-19, mRNA, (Moderna) 100 mcg/0.5 mL 01/10/2020, 02/07/2020, 09/04/2020, 01/15/2021   ??? Influenza Whole 06/18/2018   ??? Td 10/18/2006   ??? Tdap 10/15/2013   ??? influenza vaccine IM quadrivalent (Fluarix Quad) (PF) SYR (73 months of age and older) 06/20/2019   ??? influenza vaccine IM quadrivalent (Fluzone Quad) (PF) SYR/SDV (51 months of age and older) 08/13/2014, 07/03/2015   ??? influenza vaccine IM quadrivalent (Fluzone Quad) MDV (69 months of age and older) 07/06/2016   ??? influenza, unspecified formulation 07/07/2011, 07/19/2013, 06/18/2017, 07/13/2017, 06/18/2018, 07/24/2020   ??? pneumococcal conjugate vaccine 13-valent (Prevnar) 10/31/2014, 12/15/2016   ??? pneumococcal polysaccharide vaccine 23-valent (Pneumovax) 01/01/2019   ??? zoster vac recomb adjuvanted (Shingrix) 01/01/2019, 06/20/2019     Tdap: +  Pneumovax: ++ pneumo23 #1  Zostervax: ++  Hepatitis: +  Colon Cancer Screen: +  Prostate Cancer Screen: -  Abdominal US: -  PAP: +  Mammogram: +  Bone Density: +  Eye Exam: +  Dental Exam: +  Flu Shot:+      Physical Exam:     Objective:     Vitals:    Last Recorded Vital Signs:    01/21/21 1321   BP: 126/75   Pulse: 78   Temp: 36.5 ???C (97.7 ???F)   SpO2: 99%     Body mass index is 22.27 kg/m???.  Vitals:    01/21/21 1321   Weight: 138 lb (62.6 kg)   Height: 5' 6'' (1.676 m)      BP 126/75  ~ Pulse 78  ~ Temp 36.5 ???C (97.7 ???F) (Tympanic)  ~ Ht 5' 6'' (1.676 m)  ~ Wt 138 lb (62.6 kg)  ~ SpO2 99%  ~ BMI 22.27 kg/m???   General appearance: alert, appears stated age and cooperative  Head: Normocephalic, without obvious abnormality, atraumatic  Eyes: conjunctivae/corneas clear. PERRL, EOM's intact. Fundi benign.  Ears: normal TM's and external ear canals both ears  Nose: Nares normal. Septum midline. Mucosa normal. No drainage or sinus tenderness.  Neck: no adenopathy, no carotid bruit, no JVD, supple, symmetrical, trachea midline and thyroid not  enlarged, symmetric, no tenderness/mass/nodules  Back: symmetric, no curvature. ROM normal. No CVA tenderness.  Lungs: clear to auscultation bilaterally  Breasts: normal appearance, no masses or tenderness  Heart: regular rate and rhythm, S1, S2 normal, no murmur, click, rub or gallop  Abdomen: soft, non-tender; bowel sounds normal; no masses,  no organomegaly  Pelvic: deferred  Extremities: extremities normal, atraumatic, no cyanosis or edema  Pulses: 2+ and symmetric  Skin: Skin color, texture, turgor normal.  Multiple SEBORRHEIC KERATOSIS    Lymph nodes: cervical, supraclavicular, and axillary nodes normal.  Neurologic: Grossly normal          Lab/Studies:  DRAWN   MA UNABLE TO DRAW BLOOD, NECESSITATING PHYSICIAN DRAW            Assessment & Plan:   Diagnoses and all orders for this visit:    Diagnoses and all orders for this visit:    Routine general medical examination at a health care facility  -     pneumococcal polysaccharide vaccine 23-valent (Pneumovax) subcutaneous/IM  -   -     Lipid Panel; Future  -     TSH with reflex FT4, FT3; Future  -     Vitamin D,25-Hydroxy; Future  -     Comprehensive Metabolic Panel; Future  -     CBC & Auto Differential; Future  -     Hgb A1c; Future  -     Fecal Occult Blood Immunoassay; Future  -     POCT urinalysis dipstick  -     ECG 12 lead    SK (seborrheic keratosis)   Cryo >20     Mixed hyperlipidemia  -     Lipid Panel; Future  -     Comprehensive Metabolic Panel; Future    Primary insomnia   Continue all meds      End of life care   ADVANCE CARE DIRECTIVE REVIEWED/ FORMS GIVEN WILL REVIEW AT NEXT VISIT                Laboratory will be reviewed and communicated to patient.      The below was during visit.     EKG     I have independently reviewed and interpreted the above.   The records are uploaded or scanned into care connect in the patients personal file.      30 min was spent with @NAMEBYAGE @ including over 50% FTF for non preventative related care, counseling, and problem management.       The above plan of care, diagnosis, orders, and follow-up were discussed with the patient.  Questions related to this recommended plan of care were answered.      Author:  Simon Rhein. Lajean Silvius, MD 01/21/2021 2:50 PM

## 2021-01-22 LAB — CBC: NUCLEATED RBC%, AUTOMATED: 0 (ref 3.96–5.09)

## 2021-01-22 LAB — Comprehensive Metabolic Panel
ALANINE AMINOTRANSFERASE: 15 U/L (ref 8–70)
BILIRUBIN,TOTAL: 0.6 mg/dL (ref 0.1–1.2)

## 2021-01-22 LAB — HBs Ag: HEPATITIS B SURFACE ANTIGEN: NONREACTIVE

## 2021-01-22 LAB — HBs Ab Quant: HEP B SURFACE AB QUANT: <10 IU/L (ref <10)

## 2021-01-22 LAB — Lipid Panel: TRIGLYCERIDES: 62 mg/dL (ref <150)

## 2021-01-22 LAB — Differential Automated: ABSOLUTE NEUT COUNT: 2.7 10*3/uL (ref 1.80–6.90)

## 2021-01-22 LAB — Hgb A1c: HGB A1C - HPLC: 5.6 (ref <5.7)

## 2021-01-22 LAB — TSH with reflex FT4, FT3: TSH: 1.6 u[IU]/mL (ref 0.3–4.7)

## 2021-01-29 MED ORDER — ATORVASTATIN CALCIUM 10 MG PO TABS
ORAL_TABLET | 0 refills
Start: 2021-01-29 — End: ?

## 2021-02-10 DIAGNOSIS — I82409 Acute embolism and thrombosis of unspecified deep veins of unspecified lower extremity: Secondary | ICD-10-CM | POA: Diagnosis not present

## 2021-02-10 DIAGNOSIS — I509 Heart failure, unspecified: Secondary | ICD-10-CM | POA: Diagnosis not present

## 2021-02-10 DIAGNOSIS — Z299 Encounter for prophylactic measures, unspecified: Secondary | ICD-10-CM | POA: Diagnosis not present

## 2021-02-10 DIAGNOSIS — Z6841 Body Mass Index (BMI) 40.0 and over, adult: Secondary | ICD-10-CM | POA: Diagnosis not present

## 2021-02-10 DIAGNOSIS — I10 Essential (primary) hypertension: Secondary | ICD-10-CM | POA: Diagnosis not present

## 2021-02-18 DIAGNOSIS — H2513 Age-related nuclear cataract, bilateral: Secondary | ICD-10-CM | POA: Diagnosis not present

## 2021-02-18 DIAGNOSIS — H524 Presbyopia: Secondary | ICD-10-CM | POA: Diagnosis not present

## 2021-02-18 DIAGNOSIS — H5213 Myopia, bilateral: Secondary | ICD-10-CM | POA: Diagnosis not present

## 2021-02-18 DIAGNOSIS — H40013 Open angle with borderline findings, low risk, bilateral: Secondary | ICD-10-CM | POA: Diagnosis not present

## 2021-02-18 DIAGNOSIS — H52223 Regular astigmatism, bilateral: Secondary | ICD-10-CM | POA: Diagnosis not present

## 2021-02-27 DIAGNOSIS — Z01818 Encounter for other preprocedural examination: Secondary | ICD-10-CM | POA: Diagnosis not present

## 2021-02-27 DIAGNOSIS — H25012 Cortical age-related cataract, left eye: Secondary | ICD-10-CM | POA: Diagnosis not present

## 2021-02-27 DIAGNOSIS — H2512 Age-related nuclear cataract, left eye: Secondary | ICD-10-CM | POA: Diagnosis not present

## 2021-03-04 ENCOUNTER — Ambulatory Visit: Payer: PRIVATE HEALTH INSURANCE

## 2021-03-06 ENCOUNTER — Ambulatory Visit: Payer: PRIVATE HEALTH INSURANCE

## 2021-03-09 ENCOUNTER — Telehealth (HOSPITAL_COMMUNITY): Payer: Self-pay | Admitting: *Deleted

## 2021-03-09 NOTE — Telephone Encounter (Signed)
Received fax from [facility]. Pt needs clearance for cataract extraction with IV sedation and clearance to hold Xarelto 2-3 days before surgery  Per Dr Haroldine Laws pt is cleared medically for procedure and ok to hold Xarelto 2-3 days prior  Note faxed back to them at [phone]

## 2021-03-23 DIAGNOSIS — H401121 Primary open-angle glaucoma, left eye, mild stage: Secondary | ICD-10-CM | POA: Diagnosis not present

## 2021-03-23 DIAGNOSIS — H2512 Age-related nuclear cataract, left eye: Secondary | ICD-10-CM | POA: Diagnosis not present

## 2021-03-23 DIAGNOSIS — H409 Unspecified glaucoma: Secondary | ICD-10-CM | POA: Diagnosis not present

## 2021-03-23 DIAGNOSIS — H401131 Primary open-angle glaucoma, bilateral, mild stage: Secondary | ICD-10-CM | POA: Diagnosis not present

## 2021-03-30 DIAGNOSIS — H2511 Age-related nuclear cataract, right eye: Secondary | ICD-10-CM | POA: Diagnosis not present

## 2021-03-30 DIAGNOSIS — H401131 Primary open-angle glaucoma, bilateral, mild stage: Secondary | ICD-10-CM | POA: Diagnosis not present

## 2021-03-30 DIAGNOSIS — H409 Unspecified glaucoma: Secondary | ICD-10-CM | POA: Diagnosis not present

## 2021-04-03 ENCOUNTER — Ambulatory Visit (INDEPENDENT_AMBULATORY_CARE_PROVIDER_SITE_OTHER): Payer: Medicare PPO

## 2021-04-03 DIAGNOSIS — I5022 Chronic systolic (congestive) heart failure: Secondary | ICD-10-CM | POA: Diagnosis not present

## 2021-04-03 DIAGNOSIS — I255 Ischemic cardiomyopathy: Secondary | ICD-10-CM

## 2021-04-03 LAB — CUP PACEART REMOTE DEVICE CHECK
Battery Remaining Longevity: 81 mo
Battery Remaining Percentage: 83 %
Battery Voltage: 3.01 V
Brady Statistic AP VP Percent: 1 %
Brady Statistic AP VS Percent: 1 %
Brady Statistic AS VP Percent: 1 %
Brady Statistic AS VS Percent: 98 %
Brady Statistic RA Percent Paced: 1 %
Brady Statistic RV Percent Paced: 1 %
Date Time Interrogation Session: 20220617020017
HighPow Impedance: 81 Ohm
HighPow Impedance: 81 Ohm
Implantable Lead Implant Date: 20201007
Implantable Lead Implant Date: 20201007
Implantable Lead Location: 753859
Implantable Lead Location: 753860
Implantable Lead Model: 7122
Implantable Pulse Generator Implant Date: 20201007
Lead Channel Impedance Value: 450 Ohm
Lead Channel Impedance Value: 710 Ohm
Lead Channel Pacing Threshold Amplitude: 0.75 V
Lead Channel Pacing Threshold Amplitude: 0.75 V
Lead Channel Pacing Threshold Pulse Width: 0.5 ms
Lead Channel Pacing Threshold Pulse Width: 0.5 ms
Lead Channel Sensing Intrinsic Amplitude: 12 mV
Lead Channel Sensing Intrinsic Amplitude: 3.9 mV
Lead Channel Setting Pacing Amplitude: 2 V
Lead Channel Setting Pacing Amplitude: 2.5 V
Lead Channel Setting Pacing Pulse Width: 0.5 ms
Lead Channel Setting Sensing Sensitivity: 0.5 mV
Pulse Gen Serial Number: 9901215

## 2021-04-09 ENCOUNTER — Telehealth: Payer: Self-pay | Admitting: *Deleted

## 2021-04-09 MED ORDER — CARVEDILOL 25 MG PO TABS: 25.0000 mg | ORAL_TABLET | Freq: Two times a day (BID) | ORAL | 3 refills | Status: DC

## 2021-04-09 MED ORDER — ATORVASTATIN CALCIUM 10 MG PO TABS
ORAL_TABLET | 0 refills
Start: 2021-04-09 — End: ?

## 2021-04-09 NOTE — Telephone Encounter (Signed)
Left message to call back  

## 2021-04-09 NOTE — Telephone Encounter (Signed)
-----   Message from Will Meredith Leeds, MD sent at 04/03/2021  2:53 PM EDT ----- Abnormal device interrogation reviewed.  Lead parameters and battery status stable.  NSVT noted. Increase coreg to 25 mg

## 2021-04-09 NOTE — Telephone Encounter (Signed)
Informed patient of results and verbal understanding expressed. States she just got refill of Coreg 12.5 (takes 18.75 BID), she is going to take 2 tablets twice daily and see how she responds. Will update epic to new dosing of Coreg and pt will let us know when to send in refills.

## 2021-04-11 MED ORDER — ATORVASTATIN CALCIUM 10 MG PO TABS
ORAL_TABLET | 1 refills | Status: AC
Start: 2021-04-11 — End: ?

## 2021-04-16 DIAGNOSIS — E1165 Type 2 diabetes mellitus with hyperglycemia: Secondary | ICD-10-CM | POA: Diagnosis not present

## 2021-04-16 DIAGNOSIS — I10 Essential (primary) hypertension: Secondary | ICD-10-CM | POA: Diagnosis not present

## 2021-04-22 NOTE — Progress Notes (Signed)
Remote ICD transmission.   

## 2021-05-04 ENCOUNTER — Telehealth: Payer: Self-pay | Admitting: Cardiology

## 2021-05-04 MED ORDER — CARVEDILOL 25 MG PO TABS: 25.0000 mg | ORAL_TABLET | Freq: Two times a day (BID) | ORAL | 0 refills | Status: DC

## 2021-05-04 NOTE — Telephone Encounter (Signed)
*  STAT* If patient is at the pharmacy, call can be transferred to refill team.   1. Which medications need to be refilled? (please list name of each medication and dose if known)  carvedilol (COREG) 25 MG tablet  2. Which pharmacy/location (including street and city if local pharmacy) is medication to be sent to? [facility] - [place], [place]  3. Do they need a 30 day or 90 day supply? 90 with refills   Patient needs refills to go to her mail order pharmacy not the local pharmacy. She has about 2 weeks worth of pills in the smaller dose and wants to make sure the new dosage arrives on time

## 2021-05-04 NOTE — Telephone Encounter (Signed)
Left message on patients voicemail informing her medication has been sent to her pharmacy and for her to contact the office with any questions or concerns.

## 2021-05-08 ENCOUNTER — Telehealth: Payer: Self-pay

## 2021-05-08 NOTE — Telephone Encounter (Signed)
   Merlin alerts for noise reversion atrial and ventricular.  Noise present 7/21 between 12:13 - 13:42. There are three AMS episodes 4sec to 27mn, 2 NSVT. Presenting today regular AS/VS  Spoke with pt.  At time of Noise reversion she was at a friends swimming pool.  She states she has used this pool in the past not frequently however.  She also reports that construction as going on at the home.  She will make note if she uses the same pool again in the future.

## 2021-06-17 DIAGNOSIS — I5022 Chronic systolic (congestive) heart failure: Secondary | ICD-10-CM | POA: Diagnosis not present

## 2021-06-17 DIAGNOSIS — D72829 Elevated white blood cell count, unspecified: Secondary | ICD-10-CM | POA: Diagnosis not present

## 2021-06-17 DIAGNOSIS — E861 Hypovolemia: Secondary | ICD-10-CM | POA: Diagnosis not present

## 2021-06-17 DIAGNOSIS — I110 Hypertensive heart disease with heart failure: Secondary | ICD-10-CM | POA: Diagnosis not present

## 2021-06-17 DIAGNOSIS — I9589 Other hypotension: Secondary | ICD-10-CM | POA: Diagnosis not present

## 2021-06-17 DIAGNOSIS — K259 Gastric ulcer, unspecified as acute or chronic, without hemorrhage or perforation: Secondary | ICD-10-CM | POA: Diagnosis not present

## 2021-06-17 DIAGNOSIS — D62 Acute posthemorrhagic anemia: Secondary | ICD-10-CM | POA: Diagnosis not present

## 2021-06-17 DIAGNOSIS — I428 Other cardiomyopathies: Secondary | ICD-10-CM | POA: Diagnosis not present

## 2021-06-17 DIAGNOSIS — K264 Chronic or unspecified duodenal ulcer with hemorrhage: Secondary | ICD-10-CM | POA: Diagnosis not present

## 2021-06-17 DIAGNOSIS — N179 Acute kidney failure, unspecified: Secondary | ICD-10-CM | POA: Diagnosis not present

## 2021-06-17 DIAGNOSIS — D500 Iron deficiency anemia secondary to blood loss (chronic): Secondary | ICD-10-CM | POA: Diagnosis not present

## 2021-06-17 DIAGNOSIS — K260 Acute duodenal ulcer with hemorrhage: Secondary | ICD-10-CM | POA: Diagnosis not present

## 2021-06-17 DIAGNOSIS — Z20822 Contact with and (suspected) exposure to covid-19: Secondary | ICD-10-CM | POA: Diagnosis not present

## 2021-06-17 DIAGNOSIS — K922 Gastrointestinal hemorrhage, unspecified: Secondary | ICD-10-CM | POA: Diagnosis not present

## 2021-06-18 ENCOUNTER — Encounter: Payer: Medicare PPO | Admitting: Cardiology

## 2021-06-21 ENCOUNTER — Telehealth: Payer: Self-pay | Admitting: Home Health

## 2021-06-21 NOTE — Telephone Encounter (Signed)
Patient 's daughter Jennifer [name] called having some questions about Ms. Jennifer [name].  Called back at [phone], spoke directly to patient's daughter Jennifer [name], patient is nearby, Jennifer [name] reports that patient recently visited [place] and was hospitalized for GI bleed due to gastric ulcer that required cauterization and 2 units PRBC transfusion.  Patient was taking aspirin while on Xarelto for headache.  Patient was hypotensive which told to stop carvedilol, Aldactone, and Entresto by providers at the hospital.  Patient has recovered from GI bleed, was released to home yesterday.  She has been doing well without any further signs of bleeding, she was told by GI that she is okay to resume Xarelto today.  Patient and daughter questioning if carvedilol/Aldactone/Entresto can be resumed.  Patient denies any signs of dizziness, syncope, or further GI bleed.  Daughter states hospital did not mention patient had any kidney issues.  Advised daughter to check patient's blood pressure which is 124/81 currently, near her baseline.  Advised patient to resume carvedilol/Aldactone/Entresto today, continue monitor blood pressure and keep a blood pressure log for review.  Patient to avoid taking aspirin or NSAIDs while taking Xarelto.  If no further acute issue, patient can continue follow-up with Dr. Haroldine Laws as scheduled in October.  All questions answered to satisfaction.

## 2021-06-25 DIAGNOSIS — K2211 Ulcer of esophagus with bleeding: Secondary | ICD-10-CM | POA: Diagnosis not present

## 2021-06-25 DIAGNOSIS — I5022 Chronic systolic (congestive) heart failure: Secondary | ICD-10-CM | POA: Diagnosis not present

## 2021-06-25 DIAGNOSIS — Z6841 Body Mass Index (BMI) 40.0 and over, adult: Secondary | ICD-10-CM | POA: Diagnosis not present

## 2021-06-25 DIAGNOSIS — I10 Essential (primary) hypertension: Secondary | ICD-10-CM | POA: Diagnosis not present

## 2021-06-25 DIAGNOSIS — Z299 Encounter for prophylactic measures, unspecified: Secondary | ICD-10-CM | POA: Diagnosis not present

## 2021-06-29 ENCOUNTER — Encounter: Payer: Self-pay | Admitting: Internal Medicine

## 2021-07-03 ENCOUNTER — Ambulatory Visit (INDEPENDENT_AMBULATORY_CARE_PROVIDER_SITE_OTHER): Payer: Medicare PPO

## 2021-07-03 DIAGNOSIS — I5022 Chronic systolic (congestive) heart failure: Secondary | ICD-10-CM | POA: Diagnosis not present

## 2021-07-05 LAB — CUP PACEART REMOTE DEVICE CHECK
Battery Remaining Longevity: 79 mo
Battery Remaining Percentage: 81 %
Battery Voltage: 2.99 V
Brady Statistic AP VP Percent: 1 %
Brady Statistic AP VS Percent: 1 %
Brady Statistic AS VP Percent: 1 %
Brady Statistic AS VS Percent: 98 %
Brady Statistic RA Percent Paced: 1 %
Brady Statistic RV Percent Paced: 1 %
Date Time Interrogation Session: 20220916042940
HighPow Impedance: 79 Ohm
HighPow Impedance: 79 Ohm
Implantable Lead Implant Date: 20201007
Implantable Lead Implant Date: 20201007
Implantable Lead Location: 753859
Implantable Lead Location: 753860
Implantable Lead Model: 7122
Implantable Pulse Generator Implant Date: 20201007
Lead Channel Impedance Value: 410 Ohm
Lead Channel Impedance Value: 630 Ohm
Lead Channel Pacing Threshold Amplitude: 0.75 V
Lead Channel Pacing Threshold Amplitude: 0.75 V
Lead Channel Pacing Threshold Pulse Width: 0.5 ms
Lead Channel Pacing Threshold Pulse Width: 0.5 ms
Lead Channel Sensing Intrinsic Amplitude: 12 mV
Lead Channel Sensing Intrinsic Amplitude: 2.7 mV
Lead Channel Setting Pacing Amplitude: 2 V
Lead Channel Setting Pacing Amplitude: 2.5 V
Lead Channel Setting Pacing Pulse Width: 0.5 ms
Lead Channel Setting Sensing Sensitivity: 0.5 mV
Pulse Gen Serial Number: 9901215

## 2021-07-07 ENCOUNTER — Encounter: Payer: Self-pay | Admitting: Gastroenterology

## 2021-07-07 ENCOUNTER — Telehealth: Payer: Self-pay | Admitting: Gastroenterology

## 2021-07-07 ENCOUNTER — Other Ambulatory Visit: Payer: Self-pay

## 2021-07-07 ENCOUNTER — Ambulatory Visit (INDEPENDENT_AMBULATORY_CARE_PROVIDER_SITE_OTHER): Payer: Medicare PPO | Admitting: Gastroenterology

## 2021-07-07 VITALS — BP 93/61 | HR 68 | Temp 96.9°F | Ht 62.0 in | Wt 220.2 lb

## 2021-07-07 DIAGNOSIS — K2100 Gastro-esophageal reflux disease with esophagitis, without bleeding: Secondary | ICD-10-CM | POA: Diagnosis not present

## 2021-07-07 DIAGNOSIS — K264 Chronic or unspecified duodenal ulcer with hemorrhage: Secondary | ICD-10-CM | POA: Diagnosis not present

## 2021-07-07 DIAGNOSIS — K259 Gastric ulcer, unspecified as acute or chronic, without hemorrhage or perforation: Secondary | ICD-10-CM

## 2021-07-07 NOTE — Telephone Encounter (Addendum)
Pt has appt with Dr. Haroldine Laws 08/17/21. Will add pre op clearance needed to appt notes. If surgeon has any further questions they will need to contact Dr. Clayborne Dana office at with [facility]. I will forward notes to MD for appt. Will send FYI to requesting office.

## 2021-07-07 NOTE — Telephone Encounter (Addendum)
   Name: Jennifer [name]  DOB: 08-04-1956  MRN: [id number]  Primary Cardiologist: Dr. Haroldine Laws with CHF clinic  Chart reviewed as part of pre-operative protocol coverage. Because of Jovee S Daubenspeck's past medical history and time since last visit, she will require a follow-up visit in order to better assess preoperative cardiovascular risk.  See 06/21/21 phone note from Margie Billet - patient recently hospitalized for GI bleed at outside facility with multiple medications held at that time; Tonny Bollman provided instructions for restarting. GI note today indicates the EGD is planned in 8 weeks, so would like for her to be seen by the CHF clinic to review her recent hospitalization and medication adjustment very soon. In the meantime I will send a msg to pharmacy team to address the anticoagulation so that's available at time of CHF clinic review.  Pre-op covering staff: - Please schedule appointment and call patient to inform them. - Please contact requesting surgeon's office via preferred method (i.e, phone, fax) to inform them of need for appointment prior to surgery.   Charlie Pitter, PA-C  07/07/2021, 5:39 PM

## 2021-07-07 NOTE — Progress Notes (Signed)
Primary Care Physician:  Monico Blitz, MD  Primary Gastroenterologist:  Garfield Cornea, MD   Chief Complaint  Patient presents with   esophagus ulcer    HPI:  Jennifer [name] is a 65 y.o. female here at the request of Dr. Manuella Ghazi for history of ulcers.  Patient states she recently traveled to [place].  While there she developed black loose stools for 2 days, ultimately presented to the ED and admitted.  Received 2 units of packed red blood cells.  EGD with LA grade C esophagitis, chronic gastritis, 4 nonbleeding gastric ulcers, oozing duodenal ulcer with visible vessel disease treated with epinephrine injection and clips.  Patient was advised to have a follow-up EGD around 8 weeks.  States she never had regular heartburn.  About a week before this happened she had some mild epigastric pain.  No nausea or vomiting.  No dysphagia.  Appetite has been normal.  Denies NSAIDs.  Had been on aspirin 81 mg daily along with Xarelto.  No PPI.  Typically has low normal blood pressure.  At this time she still feels somewhat fatigued.  At baseline she has shortness of breath with some exertion, predominantly hills.  Currently somewhat worse than baseline but overall improved over the past several weeks.  No further black stools.  No rectal bleeding.  Denies abdominal pain, constipation or diarrhea.  Remote colonoscopy by Dr. Claudie Leach in [place].  She believes she had 1 polyp.  Father had colon cancer at age 67.  Patient is on Xarelto, developed bilateral pulmonary emboli after bypass surgery.    EGD 06/18/2021: [facility] LA Grade C reflux esophagitis with no bleeding.                         - Small hiatal hernia.                         - Chronic gastritis.                         - Non-bleeding gastric ulcers with no stigmata of                         bleeding.                         - Oozing duodenal ulcer with a visible vessel.                         Injected. Clips (MR  conditional) were placed.                         - No specimens collected.  -Protonix 40 mg twice daily recommended. -Repeat upper endoscopy in 8 weeks.  Current Outpatient Medications  Medication Sig Dispense Refill   Ascorbic Acid (VITAMIN C) 1000 MG tablet Take 1,000 mg by mouth daily.     atorvastatin (LIPITOR) 80 MG tablet Take 1 tablet (80 mg total) by mouth daily at 6 PM. 30 tablet 1   calamine lotion Apply 1 application topically as needed for itching.      carvedilol (COREG) 25 MG tablet Take 1 tablet (25 mg total) by mouth 2 (two) times daily. 180 tablet 0   cholecalciferol (VITAMIN D3) 25 MCG (1000 UT) tablet Take 1,000 Units  by mouth daily.     dapagliflozin propanediol (FARXIGA) 10 MG TABS tablet Take 10 mg by mouth daily before breakfast. 30 tablet 6   DULoxetine (CYMBALTA) 60 MG capsule Take 60 mg by mouth 2 (two) times daily.     furosemide (LASIX) 40 MG tablet Take 40 mg by mouth every other day. May take an additional tablet as needed for weight gain      gabapentin (NEURONTIN) 300 MG capsule Take 600 mg by mouth 3 (three) times daily.     pantoprazole (PROTONIX) 40 MG tablet Take 40 mg by mouth 2 (two) times daily.     Phenylephrine-APAP-guaiFENesin (MUCINEX SINUS-MAX PO) Take 2 tablets by mouth daily as needed (sinus headaches).     pramipexole (MIRAPEX) 0.5 MG tablet Take 0.5 mg by mouth at bedtime.      sacubitril-valsartan (ENTRESTO) 97-103 MG Take 1 tablet by mouth 2 (two) times daily. 180 tablet 3   spironolactone (ALDACTONE) 25 MG tablet TAKE 1 TABLET (25 MG TOTAL) BY MOUTH DAILY. 90 tablet 3   vitamin B-12 (CYANOCOBALAMIN) 500 MCG tablet Take 1 tablet (500 mcg total) by mouth daily. 30 tablet 0   XARELTO 20 MG TABS tablet Take 20 mg by mouth daily with supper.     No current facility-administered medications for this visit.    Allergies as of 07/07/2021 - Review Complete 07/07/2021  Allergen Reaction Noted   Poison oak extract Rash 08/24/2018    Past  Medical History:  Diagnosis Date   Arthritis    "probably; maybe in my hands" (09/21/2018)   Congestive heart failure (CHF) (HCC)    Dyspnea    Fibromyalgia    GERD (gastroesophageal reflux disease)    Hypercholesteremia    Hypertension    IBS (irritable bowel syndrome)    Mild sleep apnea    no CPAP (09/21/2018)   Myocardial infarction [facility])    "was told I have had one; never knew about it" (09/21/2018)   Nonischemic cardiomyopathy ([place])    ECHO EF 40-45%, Septal Thickness,and mild global left ventricular dysfunction; stress test 03/2004 +VE, refersible defect; ECHO 03/2005 @[facility]  EF 55%   Pulmonary embolus ([place])     Past Surgical History:  Procedure Laterality Date   ANKLE FRACTURE SURGERY Left 2000   BUNIONECTOMY Right 1990s?   "& removed part of my great toe due to fungus under nail"   CARDIAC CATHETERIZATION  03/2005   CORONARY ARTERY BYPASS GRAFT N/A 09/22/2018   Procedure: CORONARY ARTERY BYPASS GRAFTING (CABG), ON PUMP, TIMES TWO, USING LEFT INTERNAL MAMMARY ARTERY AND ENDOSCOPICALLY HARVESTED RIGHT GREATER SAPHENOUS VEIN;  Surgeon: Melrose Nakayama, MD;  Location: [facility];  Service: Open Heart Surgery;  Laterality: N/A;   DILATION AND CURETTAGE OF UTERUS     FRACTURE SURGERY     ICD IMPLANT N/A 07/25/2019   Procedure: ICD IMPLANT;  Surgeon: Constance Haw, MD;  Location: [facility];  Service: Cardiovascular;  Laterality: N/A;   RIGHT HEART CATH N/A 12/05/2018   Procedure: RIGHT HEART CATH;  Surgeon: Jolaine Artist, MD;  Location: [facility];  Service: Cardiovascular;  Laterality: N/A;   RIGHT/LEFT HEART CATH AND CORONARY ANGIOGRAPHY N/A 09/21/2018   Procedure: RIGHT/LEFT HEART CATH AND CORONARY ANGIOGRAPHY;  Surgeon: Troy Sine, MD;  Location: [facility];  Service: Cardiovascular;  Laterality: N/A;   TEE WITHOUT CARDIOVERSION N/A 09/22/2018   Procedure: TRANSESOPHAGEAL ECHOCARDIOGRAM (TEE);  Surgeon: Melrose Nakayama, MD;  Location:  [facility];  Service: Open Heart Surgery;  Laterality: N/A;   TUBAL LIGATION      Family History  Problem Relation Age of Onset   Diabetes Mellitus II Mother    Heart disease Mother 7   Colon cancer Father        28   Alcohol abuse Father    Depression Father    Congestive Heart Failure Brother 48       probably dilated cardiomyopathy after PNA    Social History   Socioeconomic History   Marital status: Widowed    Spouse name: Not on file   Number of children: Not on file   Years of education: Not on file   Highest education level: Not on file  Occupational History   Not on file  Tobacco Use   Smoking status: Former    Packs/day: 1.00    Years: 30.00    Pack years: 30.00    Types: Cigarettes    Quit date: 59    Years since quitting: 30.7   Smokeless tobacco: Never  Vaping Use   Vaping Use: Never used  Substance and Sexual Activity   Alcohol use: Not Currently   Drug use: Never   Sexual activity: Not Currently  Other Topics Concern   Not on file  Social History Narrative   Not on file   Social Determinants of Health   Financial Resource Strain: Not on file  Food Insecurity: Not on file  Transportation Needs: Not on file  Physical Activity: Not on file  Stress: Not on file  Social Connections: Not on file  Intimate Partner Violence: Not on file      ROS:  General: Negative for anorexia, weight loss, fever, chills, positive fatigue, weakness. Eyes: Negative for vision changes.  ENT: Negative for hoarseness, difficulty swallowing , nasal congestion. CV: Negative for chest pain, angina, palpitations, positive dyspnea on exertion, peripheral edema.  Respiratory: Negative for dyspnea at rest, positive dyspnea on exertion, cough, sputum, wheezing.  GI: See history of present illness. GU:  Negative for dysuria, hematuria, urinary incontinence, urinary frequency, nocturnal urination.  MS: Negative for joint pain, low back pain.  Derm: Negative for rash or  itching.  Neuro: Negative for weakness, abnormal sensation, seizure, frequent headaches, memory loss, confusion.  Psych: Negative for anxiety, depression, suicidal ideation, hallucinations.  Endo: Negative for unusual weight change.  Heme: Negative for bruising or bleeding. Allergy: Negative for rash or hives.    Physical Examination:  BP 93/61   Pulse 68   Temp (!) 96.9 F (36.1 C) (Temporal)   Ht 5\' 2"  (1.575 m)   Wt 220 lb 3.2 oz (99.9 kg)   BMI 40.28 kg/m    General: Well-nourished, well-developed in no acute distress.  Head: Normocephalic, atraumatic.   Eyes: Conjunctiva pink, no icterus. Mouth:masked. Neck: Supple without thyromegaly, masses, or lymphadenopathy.  Lungs: Clear to auscultation bilaterally.  Heart: Regular rate and rhythm, no murmurs rubs or gallops.  Abdomen: Bowel sounds are normal, nontender, nondistended, no hepatosplenomegaly or masses, no abdominal bruits or    hernia , no rebound or guarding.   Rectal: not performed Extremities: No lower extremity edema. No clubbing or deformities.  Neuro: Alert and oriented x 4 , grossly normal neurologically.  Skin: Warm and dry, no rash or jaundice.   Psych: Alert and cooperative, normal mood and affect.  Labs: Labs from 06/19/2021: Hemoglobin 8, hematocrit 24.9, white blood cell count 6400, platelets 170,000.  Hemoglobin on presentation June 17, 2021 for upper GI  bleed was 6.4.  Imaging Studies: CUP PACEART REMOTE DEVICE CHECK  Result Date: 07/05/2021 Scheduled remote reviewed. Normal device function.  2-NS episodes, longest 6 sec/11 beats 170's bpm. 5 AMS episodes, longest 7 min. EGMs show 1:1 probable AT and AVNRT. Burden <1%. 3 noise reversions 05/16/21 between 8:34-8:45 PM. This is not new. Next remote 91 days.    Assessment:  Pleasant 65 year old female with history of congestive heart failure, pulmonary emboli, internal cardiac defibrillator presenting for follow-up of upper GI bleed due to duodenal  ulcer with visible vessel.  Also noted to have 4 nonbleeding gastric ulcers and reflux esophagitis.  Patient presented to the ED 3 weeks ago at an outside facility in the [place] with melena, hemoglobin of 6.4.  Received 2 units of blood.  Hemoglobin 8 at time of discharge on September 2.  Clinically she is doing better from a GI standpoint.  No further bleeding noted.  Gradually her stamina is improving.  At baseline with history of dyspnea on exertion especially walking up hills.  Admits that she is not quite back at baseline.  She is scheduled to have blood work done with PCP to follow-up on her hemoglobin.  She will need follow-up endoscopy to verify ulcer healing.  H. pylori status is unknown.  Plan on EGD in approximately 8 weeks with Dr. Gala Romney with propofol.  ASA III.  Plan to hold Xarelto 48 hours prior to procedure.    Plan: Continue pantoprazole 40 mg twice daily.   Upper endoscopy in approximately 8 weeks to document ulcer healing.  Plan for propofol with Dr. Gala Romney.   ASA III.  I have discussed the risks, alternatives, benefits with regards to but not limited to the risk of reaction to medication, bleeding, infection, perforation and the patient is agreeable to proceed. Written consent to be obtained. We will request copy of last colonoscopy.   Call if any recurrent GI bleeding.

## 2021-07-07 NOTE — Patient Instructions (Signed)
Continue pantoprazole 40mg  twice daily. Plan on upper endoscopy in 8 weeks to document ulcer healing.  We will request copy of last colonoscopy report. Plan on colonoscopy at later date per your request. Call if you have any recurrent bleeding.

## 2021-07-07 NOTE — Telephone Encounter (Signed)
Attention: Preop   We would like obtain cardiac clearance for this patient please.  Procedure: EGD     Date: TBD  Medication to hold: XARELTO X 38 HRS  Surgeon: DR. Gala Romney  Phone: [phone]  Fax:  [phone]  Type of Anesthesia: PROPOFOL

## 2021-07-08 NOTE — Progress Notes (Signed)
Remote ICD transmission.   

## 2021-07-08 NOTE — Telephone Encounter (Signed)
Contacted pt appt scheduled for 9/27 at 11:00 am

## 2021-07-08 NOTE — Telephone Encounter (Signed)
Patient with diagnosis of PE on Xarelto for anticoagulation.    Procedure: RGD Date of procedure: TBD  Patient suffered PE in 10/2018 after CABG. Now on prophylaxis dose of 10mg  daily.   CrCl 76 ml/min  Per office protocol, patient can hold Xarelto for 2 days prior to procedure.

## 2021-07-08 NOTE — Telephone Encounter (Signed)
Routing to [employer] PA, she seen pt at [facility].

## 2021-07-08 NOTE — Telephone Encounter (Signed)
Chart came back to preop box. As below, would recommend getting appt with CHF team sooner given recent hospitalization at outside facility with hypotension requiring medication adjustment to finalize clearance and establish medication plan going forward. Will route back to callback team to help facilitate sooner appt; will also cc to Dr. Gala Romney and Orland Jarred in recent message below to make them aware appt is pending.

## 2021-07-09 DIAGNOSIS — I10 Essential (primary) hypertension: Secondary | ICD-10-CM | POA: Diagnosis not present

## 2021-07-09 NOTE — Telephone Encounter (Signed)
Got pt a sooner appointment with the Heart Failure team, Dr. Missy Sabins, 07/14/2021.  Clearance will be addressed at that time.    Will route back to the requesting surgeon's office to make them aware.

## 2021-07-13 ENCOUNTER — Telehealth (HOSPITAL_COMMUNITY): Payer: Self-pay | Admitting: Internal Medicine

## 2021-07-13 NOTE — Telephone Encounter (Signed)
Pt called to r/s appt, she is a work in, pt stated she can due Thursday or Friday this week, please advise

## 2021-07-14 ENCOUNTER — Encounter (HOSPITAL_COMMUNITY): Payer: Medicare PPO | Admitting: Internal Medicine

## 2021-07-14 ENCOUNTER — Telehealth (HOSPITAL_COMMUNITY): Payer: Self-pay | Admitting: Vascular Surgery

## 2021-07-14 NOTE — Telephone Encounter (Signed)
Left pt message that 9/27 appt can be moved to 10/12 W/ DB @ 11AM

## 2021-07-16 NOTE — Telephone Encounter (Signed)
Following up on clearance for this patient.

## 2021-07-16 NOTE — Telephone Encounter (Signed)
See notes. I will forward to Dr. Haroldine Laws and his nurse Esau Grew RN.

## 2021-07-17 ENCOUNTER — Telehealth: Payer: Self-pay | Admitting: Gastroenterology

## 2021-07-17 DIAGNOSIS — I10 Essential (primary) hypertension: Secondary | ICD-10-CM | POA: Diagnosis not present

## 2021-07-17 DIAGNOSIS — E7800 Pure hypercholesterolemia, unspecified: Secondary | ICD-10-CM | POA: Diagnosis not present

## 2021-07-17 NOTE — Telephone Encounter (Signed)
Pt cancelled her work in appt for clearance on 9/27 with Dr Haroldine Laws, she is sch to see him on 10/12, will address clearance at that time

## 2021-07-17 NOTE — Telephone Encounter (Signed)
Please request last CBC from PCP.

## 2021-07-20 NOTE — Telephone Encounter (Signed)
Noted. Will follow up again after 10/12.

## 2021-07-22 ENCOUNTER — Other Ambulatory Visit: Payer: Self-pay | Admitting: Gastroenterology

## 2021-07-22 DIAGNOSIS — K259 Gastric ulcer, unspecified as acute or chronic, without hemorrhage or perforation: Secondary | ICD-10-CM

## 2021-07-22 NOTE — Telephone Encounter (Signed)
Spoke with Pt and she did have labs done the end of September but wasn't sure which labs were done. Contacted patient's PCP and the only lab that was done the end of September was a metabolic panel. Informed pt and pt will have cbc drawn at [facility] today.

## 2021-07-22 NOTE — Telephone Encounter (Signed)
Ok thanks 

## 2021-07-22 NOTE — Telephone Encounter (Signed)
Received copy of labs done by PCP 10/2020. Patient had told me she was going end of 06/2021.   If she has not had follow up CBC since I saw her 07/07/21, please arrange.

## 2021-07-23 DIAGNOSIS — K259 Gastric ulcer, unspecified as acute or chronic, without hemorrhage or perforation: Secondary | ICD-10-CM | POA: Diagnosis not present

## 2021-07-24 LAB — CBC WITH DIFFERENTIAL/PLATELET
Basophils Absolute: 0.1 10*3/uL (ref 0.0–0.2)
Basos: 1 %
EOS (ABSOLUTE): 0.2 10*3/uL (ref 0.0–0.4)
Eos: 3 %
Hematocrit: 42.3 % (ref 34.0–46.6)
Hemoglobin: 13.6 g/dL (ref 11.1–15.9)
Immature Grans (Abs): 0 10*3/uL (ref 0.0–0.1)
Immature Granulocytes: 0 %
Lymphocytes Absolute: 1.6 10*3/uL (ref 0.7–3.1)
Lymphs: 30 %
MCH: 31.6 pg (ref 26.6–33.0)
MCHC: 32.2 g/dL (ref 31.5–35.7)
MCV: 98 fL — ABNORMAL HIGH (ref 79–97)
Monocytes Absolute: 0.4 10*3/uL (ref 0.1–0.9)
Monocytes: 8 %
Neutrophils Absolute: 2.9 10*3/uL (ref 1.4–7.0)
Neutrophils: 58 %
Platelets: 227 10*3/uL (ref 150–450)
RBC: 4.31 x10E6/uL (ref 3.77–5.28)
RDW: 13.3 % (ref 11.7–15.4)
WBC: 5.1 10*3/uL (ref 3.4–10.8)

## 2021-07-27 ENCOUNTER — Telehealth: Payer: Self-pay | Admitting: Internal Medicine

## 2021-07-27 NOTE — Telephone Encounter (Signed)
Pt returning call. [phone]

## 2021-07-27 NOTE — Telephone Encounter (Signed)
See result note.  

## 2021-07-28 ENCOUNTER — Encounter: Payer: Self-pay | Admitting: Gastroenterology

## 2021-07-29 ENCOUNTER — Encounter (HOSPITAL_COMMUNITY): Payer: Self-pay | Admitting: Internal Medicine

## 2021-07-29 ENCOUNTER — Ambulatory Visit (HOSPITAL_COMMUNITY)
Admission: RE | Admit: 2021-07-29 | Discharge: 2021-07-29 | Disposition: A | Discharge status: Home / Self Care | Payer: Medicare PPO | Source: Ambulatory Visit | Attending: Internal Medicine | Admitting: Internal Medicine

## 2021-07-29 ENCOUNTER — Other Ambulatory Visit: Payer: Self-pay

## 2021-07-29 VITALS — BP 90/60 | HR 64 | Wt 219.0 lb

## 2021-07-29 DIAGNOSIS — I472 Ventricular tachycardia, unspecified: Secondary | ICD-10-CM | POA: Insufficient documentation

## 2021-07-29 DIAGNOSIS — I2699 Other pulmonary embolism without acute cor pulmonale: Secondary | ICD-10-CM | POA: Diagnosis not present

## 2021-07-29 DIAGNOSIS — Z951 Presence of aortocoronary bypass graft: Secondary | ICD-10-CM | POA: Diagnosis not present

## 2021-07-29 DIAGNOSIS — Z79899 Other long term (current) drug therapy: Secondary | ICD-10-CM | POA: Insufficient documentation

## 2021-07-29 DIAGNOSIS — G4733 Obstructive sleep apnea (adult) (pediatric): Secondary | ICD-10-CM | POA: Diagnosis not present

## 2021-07-29 DIAGNOSIS — Z87891 Personal history of nicotine dependence: Secondary | ICD-10-CM | POA: Insufficient documentation

## 2021-07-29 DIAGNOSIS — Z7901 Long term (current) use of anticoagulants: Secondary | ICD-10-CM | POA: Diagnosis not present

## 2021-07-29 DIAGNOSIS — M797 Fibromyalgia: Secondary | ICD-10-CM | POA: Diagnosis not present

## 2021-07-29 DIAGNOSIS — Z8249 Family history of ischemic heart disease and other diseases of the circulatory system: Secondary | ICD-10-CM | POA: Insufficient documentation

## 2021-07-29 DIAGNOSIS — I493 Ventricular premature depolarization: Secondary | ICD-10-CM | POA: Insufficient documentation

## 2021-07-29 DIAGNOSIS — Z8616 Personal history of COVID-19: Secondary | ICD-10-CM | POA: Insufficient documentation

## 2021-07-29 DIAGNOSIS — I340 Nonrheumatic mitral (valve) insufficiency: Secondary | ICD-10-CM | POA: Insufficient documentation

## 2021-07-29 DIAGNOSIS — I2510 Atherosclerotic heart disease of native coronary artery without angina pectoris: Secondary | ICD-10-CM | POA: Insufficient documentation

## 2021-07-29 DIAGNOSIS — I5022 Chronic systolic (congestive) heart failure: Secondary | ICD-10-CM | POA: Insufficient documentation

## 2021-07-29 DIAGNOSIS — Z86718 Personal history of other venous thrombosis and embolism: Secondary | ICD-10-CM | POA: Diagnosis not present

## 2021-07-29 DIAGNOSIS — E785 Hyperlipidemia, unspecified: Secondary | ICD-10-CM

## 2021-07-29 DIAGNOSIS — I428 Other cardiomyopathies: Secondary | ICD-10-CM | POA: Diagnosis not present

## 2021-07-29 DIAGNOSIS — I110 Hypertensive heart disease with heart failure: Secondary | ICD-10-CM | POA: Diagnosis not present

## 2021-07-29 DIAGNOSIS — Z86711 Personal history of pulmonary embolism: Secondary | ICD-10-CM | POA: Diagnosis not present

## 2021-07-29 LAB — LIPID PANEL
Cholesterol: 126 mg/dL (ref 0–200)
HDL: 49 mg/dL (ref >40)
LDL Cholesterol: 56 mg/dL (ref 0–99)
Total CHOL/HDL Ratio: 2.6 RATIO
Triglycerides: 105 mg/dL (ref <150)
VLDL: 21 mg/dL (ref 0–40)

## 2021-07-29 LAB — COMPREHENSIVE METABOLIC PANEL
ALT: 18 U/L (ref 0–44)
AST: 21 U/L (ref 15–41)
Albumin: 3.7 g/dL (ref 3.5–5.0)
Alkaline Phosphatase: 67 U/L (ref 38–126)
Anion gap: 8 (ref 5–15)
BUN: 14 mg/dL (ref 8–23)
CO2: 24 mmol/L (ref 22–32)
Calcium: 9.2 mg/dL (ref 8.9–10.3)
Chloride: 109 mmol/L (ref 98–111)
Creatinine, Ser: 0.8 mg/dL (ref 0.44–1.00)
GFR, Estimated: >60 mL/min (ref >60)
Glucose, Bld: 110 mg/dL — ABNORMAL HIGH (ref 70–99)
Potassium: 4.1 mmol/L (ref 3.5–5.1)
Sodium: 141 mmol/L (ref 135–145)
Total Bilirubin: 0.6 mg/dL (ref 0.3–1.2)
Total Protein: 6.6 g/dL (ref 6.5–8.1)

## 2021-07-29 LAB — BRAIN NATRIURETIC PEPTIDE: B Natriuretic Peptide: 143.9 pg/mL — ABNORMAL HIGH (ref 0.0–100.0)

## 2021-07-29 MED ORDER — APIXABAN 2.5 MG PO TABS: 2.5000 mg | ORAL_TABLET | Freq: Two times a day (BID) | ORAL | 11 refills | Status: AC

## 2021-07-29 MED ORDER — PANTOPRAZOLE SODIUM 40 MG PO TBEC: 40.0000 mg | DELAYED_RELEASE_TABLET | Freq: Every day | ORAL | 11 refills | Status: DC

## 2021-07-29 NOTE — Progress Notes (Addendum)
Advanced [facility] Failure Clinic Note  Date:  07/29/2021   ID:  Jennifer [name], DOB 01-04-1956, MRN [id number]  Location: Home  Provider location: [place] [facility] Type of Visit: Established patient   PCP:  Monico Blitz, MD  Cardiologist:  Kate Sable, MD (Inactive) Primary HF: Dr Haroldine Laws  EP: Dr Curt Bears  Chief Complaint: F/u for Chronic Systolic Heart Failure, S/p COVID   History of Present Illness:  Jennifer [name] is a 65 y.o. female with a history of systolic HF due to mixed ICM/NICM, DVT, PE, fibromyalgia.   Long h/o reported mild NICM EF 40-45%. Echo 11/19 EF 15-20%  Catheterization done 09/21/18 revealed 90% proximal LAD and a 50% circumflex stenosis.  She underwent bypass grafting x2 with an LIMA to the LAD and an SVG to the OM on 09/22/18 with Dr Roxan Hockey.  Post CABG EF remained depressed.   -cMRI 2/20 LVEF 21% Moderately reduced RV, moderateTR. No LGE -ECHO 02/2019 EF 20-25% RV severerly reduced  - Echo 12/26/19 EF 20-25% RV normal.   Zio Patch 5/20 7.3 % PVCs 1. Predominant underlying rhythm was Sinus Rhythm. Minimum HR of 56 bpm, max HR of 176 bpm, and avg HR of 77 bpm. 2. Three runs of NSVT - longest 10 beats 3. Seven runs of SVT - longest lasting 9 beats   Started carvedilol 03/13/2019    Underwent SJ ICD 07/25/19   Here for routine f/u. Had GI bleed in in 8/22 in [place]. Got 2u RBCs. . EGD showed 1 oozing cratered duodenal ulcer with visible vessel found in the duodenal bulb and in the first part portion of the duodenum. Lesion was 9 mm in largest dimension. 3 hemostatic clips were successfully placed to stop the bleeding. Additionally 4 nonbleeding cratered gastric ulcers with no stigmata of bleeding were found in the gastric antrum. A small hiatal hernia was also appreciated. No further bleeding. Back on Xarelto. Says she gets does a lot of walking but will give out after 1/4 mile. No CP, orthopnea or PND. Still working at the barn.   ICD  interrogated in clinic. No VT. Occasional brief mode switches. Volume ok. Activity 4hr/day Personally reviewed    Cardiac studies:  CPX 07/09/19  FVC 2.09 (73%)      FEV1 1.47 (65%)        FEV1/FVC 70 (88%)        MVV 70 (81%)       Resting HR: 52 Standing HR:48 Peak HR: 102   (65% age predicted max HR)  BP rest: 84/60 Standing BP:90/60 BP peak: 124/60  Peak VO2: 16.0 (89% predicted peak VO2)  VE/VCO2 slope:  29  OUES: 1.81  Peak RER: 1.05  VE/MVV:  67%  Echo  02/19/2019 EF 20-25% RV severely reduced 11/28/2018 EF 10-15% with moderate RV dysfunction.  12/26/19 EF 20-25% RV normal.   cMRI 12/05/18 1. Moderately dilated LV with EF 21%, diffuse hypokinesis.  2. Normal RV size with moderately decreased systolic function, EF 25%. 3. Moderate TR, at least moderate central MR (likely functional). 4. No myocardial LGE, so no evidence for prior infarct, infiltrative disease, or myocarditis.  RHC 12/05/18 On milrinone 0.125 RA = 8 RV = 37/10 PA = 39/15 (25) PCW = 19 (v = 35) Fick cardiac output/index = 5.0/2.8 PVR = 1.1 WU Ao sat = 94% PA sat = 66%, 66% PaPi = 3.0 RA/PCW = 0.42  Past Medical History:  Diagnosis Date   Arthritis    "  probably; maybe in my hands" (09/21/2018)   Congestive heart failure (CHF) (HCC)    Dyspnea    Fibromyalgia    GERD (gastroesophageal reflux disease)    Hypercholesteremia    Hypertension    IBS (irritable bowel syndrome)    Mild sleep apnea    no CPAP (09/21/2018)   Myocardial infarction [facility])    "was told I have had one; never knew about it" (09/21/2018)   Nonischemic cardiomyopathy ([place])    ECHO EF 40-45%, Septal Thickness,and mild global left ventricular dysfunction; stress test 03/2004 +VE, refersible defect; ECHO 03/2005 @[facility]  EF 55%   Pulmonary embolus (HCC)    Past Surgical History:  Procedure Laterality Date   ANKLE FRACTURE SURGERY Left 2000   BUNIONECTOMY Right 1990s?   "& removed part of my great toe due to fungus under nail"    CARDIAC CATHETERIZATION  03/2005   COLONOSCOPY  07/2011   Dr. Britta Mccreedy at [id number] polyp found in the distal transverse colon, approximately 1 cm in size, removed.  Mild diverticulosis.  Internal hemorrhoids.  Path report available.   CORONARY ARTERY BYPASS GRAFT N/A 09/22/2018   Procedure: CORONARY ARTERY BYPASS GRAFTING (CABG), ON PUMP, TIMES TWO, USING LEFT INTERNAL MAMMARY ARTERY AND ENDOSCOPICALLY HARVESTED RIGHT GREATER SAPHENOUS VEIN;  Surgeon: Melrose Nakayama, MD;  Location: [place];  Service: Open Heart Surgery;  Laterality: N/A;   DILATION AND CURETTAGE OF UTERUS     FRACTURE SURGERY     ICD IMPLANT N/A 07/25/2019   Procedure: ICD IMPLANT;  Surgeon: Constance Haw, MD;  Location: [place] [facility];  Service: Cardiovascular;  Laterality: N/A;   RIGHT HEART CATH N/A 12/05/2018   Procedure: RIGHT HEART CATH;  Surgeon: Jolaine Artist, MD;  Location: [place] [facility];  Service: Cardiovascular;  Laterality: N/A;   RIGHT/LEFT HEART CATH AND CORONARY ANGIOGRAPHY N/A 09/21/2018   Procedure: RIGHT/LEFT HEART CATH AND CORONARY ANGIOGRAPHY;  Surgeon: Troy Sine, MD;  Location: [place] [facility];  Service: Cardiovascular;  Laterality: N/A;   TEE WITHOUT CARDIOVERSION N/A 09/22/2018   Procedure: TRANSESOPHAGEAL ECHOCARDIOGRAM (TEE);  Surgeon: Melrose Nakayama, MD;  Location: [place];  Service: Open Heart Surgery;  Laterality: N/A;   TUBAL LIGATION       Current Outpatient Medications  Medication Sig Dispense Refill   Ascorbic Acid (VITAMIN C) 1000 MG tablet Take 1,000 mg by mouth daily.     atorvastatin (LIPITOR) 80 MG tablet Take 1 tablet (80 mg total) by mouth daily at 6 PM. 30 tablet 1   calamine lotion Apply 1 application topically as needed for itching.      carvedilol (COREG) 25 MG tablet Take 1 tablet (25 mg total) by mouth 2 (two) times daily. 180 tablet 0   cholecalciferol (VITAMIN D3) 25 MCG (1000 UT) tablet Take 1,000 Units by mouth daily.      dapagliflozin propanediol (FARXIGA) 10 MG TABS tablet Take 10 mg by mouth daily before breakfast. 30 tablet 6   DULoxetine (CYMBALTA) 60 MG capsule Take 60 mg by mouth 2 (two) times daily.     furosemide (LASIX) 40 MG tablet Take 40 mg by mouth as needed.     gabapentin (NEURONTIN) 300 MG capsule Take 600 mg by mouth 3 (three) times daily.     Phenylephrine-APAP-guaiFENesin (MUCINEX SINUS-MAX PO) Take 2 tablets by mouth daily as needed (sinus headaches).     pramipexole (MIRAPEX) 0.5 MG tablet Take 0.5 mg by mouth at bedtime.  sacubitril-valsartan (ENTRESTO) 97-103 MG Take 1 tablet by mouth 2 (two) times daily. 180 tablet 3   spironolactone (ALDACTONE) 25 MG tablet TAKE 1 TABLET (25 MG TOTAL) BY MOUTH DAILY. 90 tablet 3   vitamin B-12 (CYANOCOBALAMIN) 500 MCG tablet Take 1 tablet (500 mcg total) by mouth daily. 30 tablet 0   XARELTO 20 MG TABS tablet Take 20 mg by mouth daily with supper.     No current facility-administered medications for this encounter.    Allergies:   Poison oak extract   Social History:  The patient  reports that she quit smoking about 30 years ago. Her smoking use included cigarettes. She has a 30.00 pack-year smoking history. She has never used smokeless tobacco. She reports that she does not currently use alcohol. She reports that she does not use drugs.   Family History:  The patient's family history includes Alcohol abuse in her father; Colon cancer in her father; Congestive Heart Failure (age of onset: 49) in her brother; Depression in her father; Diabetes Mellitus II in her mother; Heart disease (age of onset: 83) in her mother.   ROS:  Please see the history of present illness.   All other systems are personally reviewed and negative.  Vitals:   07/29/21 1055  BP: 90/60  Pulse: 64  SpO2: 97%    PHYSICAL EXAM: General:  Well appearing. No resp difficulty HEENT: normal Neck: supple. no JVD. Carotids 2+ bilat; no bruits. No lymphadenopathy or thryomegaly  appreciated. Cor: PMI nondisplaced. Regular rate & rhythm. No rubs, gallops or murmurs. Lungs: clear Abdomen: obese soft, nontender, nondistended. No hepatosplenomegaly. No bruits or masses. Good bowel sounds. Extremities: no cyanosis, clubbing, rash, edema Neuro: alert & orientedx3, cranial nerves grossly intact. moves all 4 extremities w/o difficulty. Affect pleasant    Recent Labs: 11/24/2020: B Natriuretic Peptide 108.5; BUN 20; Creatinine, Ser 1.01; Potassium 5.0; Sodium 141 07/23/2021: Hemoglobin 13.6; Platelets 227  Personally reviewed   Wt Readings from Last 3 Encounters:  07/29/21 99.3 kg (219 lb)  07/07/21 99.9 kg (220 lb 3.2 oz)  11/24/20 99.5 kg (219 lb 6.4 oz)     ASSESSMENT AND PLAN:  1. Biventricular Chronic Systolic HF  Suspect mixed ICM/NICM (?PVCs) - LHC 2019 90% LAD and 50% circumflex --> S/P CABG x2 LIMA to LAD and SVG to OM  - ECHO 11/2018 EF 10-15% moderate RV dysfunction.  - cMRI 11/2018  LVEF 21% Moderately reduced RV, moderateTR moderate to severe MR. No LGE - ECHO 02/2019 EF 20-25% RV severerly reduced - Echo 9/20 EF 20-25% severe MR - Echo 3/20 EF 20-25%, RV normal, mild MR   - Echo  07/24/20 EF 25-30% RV normal mild MR.  - Zio Patch 5/20 with 7.3% PVCs.  - s/p ST Jude  ICD - CPX test 9/20 with preserved functional capacity but low HR. Off ivabradine  - Stable NYHA II-III - On full GDMT - Continue entresto 97/103 twice a day  - Continue spiro 25 daily  - Continue Farxiga 10mg  daily - Continue carvedilol 25 tid - ICD interrogated personally. Volume ok. No VT/AF.  - Labs today - Due for repeat echo  2. H/O bilateral PE 1/20 - Continue Xarelto. Previously on reduced dose,10 daily based on data from chronic prevention from [place] but now back on 20 - With recent GIB I would prefer to have her on apixaban 2.5 bid based on Amplify EXT trial   3. CAD S/P CABG 09/2018 - LIMA to the LAD and an  SVG to the OM on 09/22/18 with Dr Roxan Hockey - No s/s  angina - Continue statin and ? blocker - no ASA with DOAC use  4. PVCs - Zio Patch 5/20 with 7.3% PVCs.  - Improved. PVC burden 1% on device interrogation   5. Snoring - Home sleep study 7/20. AHI 7.2 with desat down to 85% - She decided against CPAP  6. Mitral Regurgitation  - likely due to LV dysfunction/dilation. - mild on recent echo  7. PUD  - with acute GI bleed in 8/22 - followed by GI - Restart Protonix - Keep off ASA - she is at acceptable cardiac risk to proceed with repeat endoscopy including EGD +/- colonoscopy. Ok to stop DOAC for 3 days prior.   Glori Bickers, MD  07/29/2021 11:18 AM  [facility] [address] [facility] [place] [address] [phone] (office) [phone] (fax)

## 2021-07-29 NOTE — Telephone Encounter (Signed)
Following up on clearance for this patient. Patient was seen by Dr. Haroldine Laws today to address clearance and did not see it mentioned in office note.

## 2021-07-29 NOTE — Addendum Note (Signed)
Encounter addended by: Jolaine Artist, MD on: 07/29/2021 3:46 PM  Actions taken: Clinical Note Signed

## 2021-07-29 NOTE — Addendum Note (Signed)
Encounter addended by: Scarlette Calico, RN on: 07/29/2021 11:41 AM  Actions taken: Medication long-term status modified, Visit diagnoses modified, Diagnosis association updated, Order list changed, Pharmacy for encounter modified, Clinical Note Signed, Charge Capture section accepted

## 2021-07-29 NOTE — Patient Instructions (Signed)
Stop Xarelto  Start Eliquis 2.5 mg Twice daily   Start Protonix 40 mg Daily  Labs done today, your results will be available in MyChart, we will contact you for abnormal readings.  Your physician recommends that you schedule a follow-up appointment in: 6 months (April 2023), **PLEASE CALL OUR OFFICE IN February TO SCHEDULE THIS APPOINTMENT  If you have any questions or concerns before your next appointment please send [place] a message through [place] or call our office at [phone].    TO LEAVE A MESSAGE FOR THE NURSE SELECT OPTION 2, PLEASE LEAVE A MESSAGE INCLUDING: YOUR NAME DATE OF BIRTH CALL BACK NUMBER REASON FOR CALL**this is important as we prioritize the call backs  YOU WILL RECEIVE A CALL BACK THE SAME DAY AS LONG AS YOU CALL BEFORE 4:00 PM  At the [facility], you and your health needs are our priority. As part of our continuing mission to provide you with exceptional heart care, we have created designated Provider Care Teams. These Care Teams include your primary Cardiologist (physician) and Advanced Practice Providers (APPs- Physician Assistants and Nurse Practitioners) who all work together to provide you with the care you need, when you need it.   You may see any of the following providers on your designated Care Team at your next follow up: Dr Glori Bickers Dr Loralie Champagne Dr Patrice Paradise, NP Lyda Jester, [place] Ginnie Smart Audry Riles, PharmD   Please be sure to bring in all your medications bottles to every appointment.

## 2021-07-31 ENCOUNTER — Other Ambulatory Visit (HOSPITAL_COMMUNITY): Payer: Self-pay

## 2021-07-31 ENCOUNTER — Telehealth (HOSPITAL_COMMUNITY): Payer: Self-pay | Admitting: Pharmacy Technician

## 2021-07-31 NOTE — Telephone Encounter (Signed)
I will fax over Dr. Clayborne Dana ov note. See message below: I did copy & paste in Dr. Clayborne Dana clearance and recommendations.     7. PUD  - with acute GI bleed in 8/22 - followed by GI - Restart Protonix - Keep off ASA - she is at acceptable cardiac risk to proceed with repeat endoscopy including EGD +/- colonoscopy. Ok to stop DOAC for 3 days prior.    Glori Bickers, MD  07/29/2021 11:18 AM

## 2021-07-31 NOTE — Telephone Encounter (Signed)
Advanced Heart Failure Patient Advocate Encounter  Patient called in requesting assistance with Eliquis. We went over the requirements for BMS assistance, which she does not think she has met. Will provide 2 months of samples while she is in the donut hole.  LOT [id number] EXP 06/2022  Charlann Boxer, CPhT

## 2021-08-03 NOTE — Telephone Encounter (Signed)
Will call pt once we receive Dr. Gala Romney next schedule

## 2021-08-03 NOTE — Telephone Encounter (Signed)
Noted. Forwarded to clinical pool for scheduling.

## 2021-08-13 NOTE — Telephone Encounter (Signed)
noted 

## 2021-08-16 NOTE — Progress Notes (Signed)
Error

## 2021-08-17 ENCOUNTER — Inpatient Hospital Stay (HOSPITAL_BASED_OUTPATIENT_CLINIC_OR_DEPARTMENT_OTHER)
Admission: RE | Admit: 2021-08-17 | Discharge: 2021-08-17 | Disposition: A | Discharge status: Home / Self Care | Payer: Medicare PPO | Source: Ambulatory Visit | Attending: Internal Medicine | Admitting: Internal Medicine

## 2021-08-17 ENCOUNTER — Encounter (HOSPITAL_COMMUNITY): Payer: Self-pay

## 2021-08-17 DIAGNOSIS — I5022 Chronic systolic (congestive) heart failure: Secondary | ICD-10-CM

## 2021-08-21 ENCOUNTER — Telehealth: Payer: Self-pay | Admitting: *Deleted

## 2021-08-21 ENCOUNTER — Encounter: Payer: Self-pay | Admitting: *Deleted

## 2021-08-21 NOTE — Telephone Encounter (Signed)
Called pt. She has been scheduled for EGD with propofol, asa 3 with Dr. Gala Romney on 12/1 at 8:30am. Aware will mail prep instructions and will let her know when her pre-op appt will be. She will be told arrival time at pre-op appt.   Patient also states she is no longer on xarelto. She has been changed over to Eliquis 07/29/2021. Magda Paganini please advise if okay to hold or do we need clearance again? Original clearance from 9/20 phone note

## 2021-08-21 NOTE — Telephone Encounter (Signed)
No need for repeat cardiac clearance. She will need to hold Eliquis 48 hours before procedure.

## 2021-08-21 NOTE — Telephone Encounter (Signed)
Noted. Instructions mailed to pt with pre-op appt  PA submitted via [employer]. Pending review. Tracking#  [id number]

## 2021-08-24 NOTE — Telephone Encounter (Signed)
PA approved via [employer]. Auth# [id number] DOS 09/17/2021-10/17/2021

## 2021-08-26 ENCOUNTER — Other Ambulatory Visit (HOSPITAL_COMMUNITY): Payer: Self-pay | Admitting: Internal Medicine

## 2021-09-01 ENCOUNTER — Telehealth (HOSPITAL_COMMUNITY): Payer: Self-pay

## 2021-09-01 ENCOUNTER — Other Ambulatory Visit: Payer: Self-pay

## 2021-09-01 MED ORDER — CARVEDILOL 25 MG PO TABS: 25.0000 mg | ORAL_TABLET | Freq: Two times a day (BID) | ORAL | 0 refills | Status: DC

## 2021-09-01 NOTE — Telephone Encounter (Signed)
Patient wants to know if it is ok if she increases the pantoprazole back to bid instead of qd that she was prescribed at her last ov?

## 2021-09-01 NOTE — Telephone Encounter (Signed)
Pt's medication was sent to pt's pharmacy as requested. Confirmation received.  °

## 2021-09-08 MED ORDER — PANTOPRAZOLE SODIUM 40 MG PO TBEC: 40.0000 mg | DELAYED_RELEASE_TABLET | Freq: Two times a day (BID) | ORAL | 2 refills | Status: DC

## 2021-09-08 NOTE — Telephone Encounter (Signed)
Patient advised and verbalized understanding. Refill sent

## 2021-09-08 NOTE — Addendum Note (Signed)
Addended by: Shela Nevin R on: 16/96/7893 11:40 AM   Modules accepted: Orders

## 2021-09-14 NOTE — Patient Instructions (Addendum)
Jennifer [name]  09/14/2021     @PREFPERIOPPHARMACY @   Your procedure is scheduled on  09/17/2021.   Report to [facility] at  0700 A.M.   Call this number if you have problems the morning of surgery:  [phone]   Remember:  Follow the diet instructions given to you by the office.    Your last dose of eliquis should be 09/14/2021.    Take these medicines the morning of surgery with A SIP OF WATER       carvedilol, cymbalta, gabapentin, protonix, entresto.     Do not wear jewelry, make-up or nail polish.  Do not wear lotions, powders, or perfumes, or deodorant.  Do not shave 48 hours prior to surgery.  Men may shave face and neck.  Do not bring valuables to the hospital.  [facility] is not responsible for any belongings or valuables.  Contacts, dentures or bridgework may not be worn into surgery.  Leave your suitcase in the car.  After surgery it may be brought to your room.  For patients admitted to the hospital, discharge time will be determined by your treatment team.  Patients discharged the day of surgery will not be allowed to drive home and must have someone with them for 24 hours.    Special instructions:   DO NOT smoke tobacco or vape for 24 hours before your procedure.  Please read over the following fact sheets that you were given. Anesthesia Post-op Instructions and Care and Recovery After Surgery      Upper Endoscopy, Adult, Care After This sheet gives you information about how to care for yourself after your procedure. Your health care provider may also give you more specific instructions. If you have problems or questions, contact your health care provider. What can I expect after the procedure? After the procedure, it is common to have: A sore throat. Mild stomach pain or discomfort. Bloating. Nausea. Follow these instructions at home:  Follow instructions from your health care provider about what to eat or drink after your  procedure. Return to your normal activities as told by your health care provider. Ask your health care provider what activities are safe for you. Take over-the-counter and prescription medicines only as told by your health care provider. If you were given a sedative during the procedure, it can affect you for several hours. Do not drive or operate machinery until your health care provider says that it is safe. Keep all follow-up visits as told by your health care provider. This is important. Contact a health care provider if you have: A sore throat that lasts longer than one day. Trouble swallowing. Get help right away if: You vomit blood or your vomit looks like coffee grounds. You have: A fever. Bloody, black, or tarry stools. A severe sore throat or you cannot swallow. Difficulty breathing. Severe pain in your chest or abdomen. Summary After the procedure, it is common to have a sore throat, mild stomach discomfort, bloating, and nausea. If you were given a sedative during the procedure, it can affect you for several hours. Do not drive or operate machinery until your health care provider says that it is safe. Follow instructions from your health care provider about what to eat or drink after your procedure. Return to your normal activities as told by your health care provider. This information is not intended to replace advice given to you by your health care provider. Make sure you discuss any  questions you have with your health care provider. Document Revised: 08/10/2019 Document Reviewed: 03/06/2018 Elsevier Patient Education  2022 [place] After This sheet gives you information about how to care for yourself after your procedure. Your health care provider may also give you more specific instructions. If you have problems or questions, contact your health care provider. What can I expect after the procedure? After the procedure, it is common to  have: Tiredness. Forgetfulness about what happened after the procedure. Impaired judgment for important decisions. Nausea or vomiting. Some difficulty with balance. Follow these instructions at home: For the time period you were told by your health care provider:   Rest as needed. Do not participate in activities where you could fall or become injured. Do not drive or use machinery. Do not drink alcohol. Do not take sleeping pills or medicines that cause drowsiness. Do not make important decisions or sign legal documents. Do not take care of children on your own. Eating and drinking Follow the diet that is recommended by your health care provider. Drink enough fluid to keep your urine pale yellow. If you vomit: Drink water, juice, or soup when you can drink without vomiting. Make sure you have little or no nausea before eating solid foods. General instructions Have a responsible adult stay with you for the time you are told. It is important to have someone help care for you until you are awake and alert. Take over-the-counter and prescription medicines only as told by your health care provider. If you have sleep apnea, surgery and certain medicines can increase your risk for breathing problems. Follow instructions from your health care provider about wearing your sleep device: Anytime you are sleeping, including during daytime naps. While taking prescription pain medicines, sleeping medicines, or medicines that make you drowsy. Avoid smoking. Keep all follow-up visits as told by your health care provider. This is important. Contact a health care provider if: You keep feeling nauseous or you keep vomiting. You feel light-headed. You are still sleepy or having trouble with balance after 24 hours. You develop a rash. You have a fever. You have redness or swelling around the IV site. Get help right away if: You have trouble breathing. You have new-onset confusion at  home. Summary For several hours after your procedure, you may feel tired. You may also be forgetful and have poor judgment. Have a responsible adult stay with you for the time you are told. It is important to have someone help care for you until you are awake and alert. Rest as told. Do not drive or operate machinery. Do not drink alcohol or take sleeping pills. Get help right away if you have trouble breathing, or if you suddenly become confused. This information is not intended to replace advice given to you by your health care provider. Make sure you discuss any questions you have with your health care provider. Document Revised: 06/19/2020 Document Reviewed: 09/06/2019 Elsevier Patient Education  2022 [employer].

## 2021-09-15 ENCOUNTER — Encounter (HOSPITAL_COMMUNITY): Payer: Self-pay

## 2021-09-15 ENCOUNTER — Other Ambulatory Visit: Payer: Self-pay

## 2021-09-15 ENCOUNTER — Encounter (HOSPITAL_COMMUNITY)
Admission: RE | Admit: 2021-09-15 | Discharge: 2021-09-15 | Disposition: A | Discharge status: Home / Self Care | Payer: Medicare PPO | Source: Ambulatory Visit | Attending: Internal Medicine | Admitting: Internal Medicine

## 2021-09-17 ENCOUNTER — Encounter (HOSPITAL_COMMUNITY)
Admission: RE | Disposition: A | Discharge status: Home / Self Care | Payer: Self-pay | Source: Home / Self Care | Attending: Internal Medicine

## 2021-09-17 ENCOUNTER — Ambulatory Visit (HOSPITAL_COMMUNITY): Payer: Medicare PPO | Admitting: Anesthesiology

## 2021-09-17 ENCOUNTER — Encounter (HOSPITAL_COMMUNITY): Payer: Self-pay | Admitting: Internal Medicine

## 2021-09-17 ENCOUNTER — Ambulatory Visit (HOSPITAL_COMMUNITY)
Admission: RE | Admit: 2021-09-17 | Discharge: 2021-09-17 | Disposition: A | Discharge status: Home / Self Care | Payer: Medicare PPO | Attending: Internal Medicine | Admitting: Internal Medicine

## 2021-09-17 DIAGNOSIS — K219 Gastro-esophageal reflux disease without esophagitis: Secondary | ICD-10-CM | POA: Insufficient documentation

## 2021-09-17 DIAGNOSIS — I509 Heart failure, unspecified: Secondary | ICD-10-CM | POA: Diagnosis not present

## 2021-09-17 DIAGNOSIS — I252 Old myocardial infarction: Secondary | ICD-10-CM | POA: Insufficient documentation

## 2021-09-17 DIAGNOSIS — G4730 Sleep apnea, unspecified: Secondary | ICD-10-CM | POA: Insufficient documentation

## 2021-09-17 DIAGNOSIS — Z79899 Other long term (current) drug therapy: Secondary | ICD-10-CM | POA: Diagnosis not present

## 2021-09-17 DIAGNOSIS — K279 Peptic ulcer, site unspecified, unspecified as acute or chronic, without hemorrhage or perforation: Secondary | ICD-10-CM | POA: Diagnosis not present

## 2021-09-17 DIAGNOSIS — K259 Gastric ulcer, unspecified as acute or chronic, without hemorrhage or perforation: Secondary | ICD-10-CM | POA: Diagnosis not present

## 2021-09-17 DIAGNOSIS — I110 Hypertensive heart disease with heart failure: Secondary | ICD-10-CM | POA: Insufficient documentation

## 2021-09-17 DIAGNOSIS — K922 Gastrointestinal hemorrhage, unspecified: Secondary | ICD-10-CM | POA: Diagnosis not present

## 2021-09-17 DIAGNOSIS — K449 Diaphragmatic hernia without obstruction or gangrene: Secondary | ICD-10-CM | POA: Diagnosis not present

## 2021-09-17 DIAGNOSIS — Z7901 Long term (current) use of anticoagulants: Secondary | ICD-10-CM | POA: Diagnosis not present

## 2021-09-17 DIAGNOSIS — Z951 Presence of aortocoronary bypass graft: Secondary | ICD-10-CM | POA: Diagnosis not present

## 2021-09-17 DIAGNOSIS — K269 Duodenal ulcer, unspecified as acute or chronic, without hemorrhage or perforation: Secondary | ICD-10-CM | POA: Diagnosis not present

## 2021-09-17 DIAGNOSIS — Z09 Encounter for follow-up examination after completed treatment for conditions other than malignant neoplasm: Secondary | ICD-10-CM | POA: Diagnosis not present

## 2021-09-17 DIAGNOSIS — I2510 Atherosclerotic heart disease of native coronary artery without angina pectoris: Secondary | ICD-10-CM | POA: Insufficient documentation

## 2021-09-17 DIAGNOSIS — Z8711 Personal history of peptic ulcer disease: Secondary | ICD-10-CM | POA: Insufficient documentation

## 2021-09-17 HISTORY — PX: ESOPHAGOGASTRODUODENOSCOPY (EGD) WITH PROPOFOL: SHX5813

## 2021-09-17 HISTORY — PX: BIOPSY: SHX5522

## 2021-09-17 SURGERY — ESOPHAGOGASTRODUODENOSCOPY (EGD) WITH PROPOFOL
Anesthesia: General

## 2021-09-17 MED ORDER — PHENYLEPHRINE 40 MCG/ML (10ML) SYRINGE FOR IV PUSH (FOR BLOOD PRESSURE SUPPORT)
PREFILLED_SYRINGE | INTRAVENOUS | Status: AC
  Filled 2021-09-17: qty 10

## 2021-09-17 MED ORDER — LIDOCAINE HCL (CARDIAC) PF 100 MG/5ML IV SOSY
PREFILLED_SYRINGE | INTRAVENOUS | Status: DC | PRN
  Administered 2021-09-17: 50 mg via INTRAVENOUS

## 2021-09-17 MED ORDER — PROPOFOL 500 MG/50ML IV EMUL
INTRAVENOUS | Status: DC | PRN
  Administered 2021-09-17: 50 ug/kg/min via INTRAVENOUS

## 2021-09-17 MED ORDER — KETAMINE HCL 50 MG/5ML IJ SOSY
PREFILLED_SYRINGE | INTRAMUSCULAR | Status: AC
  Filled 2021-09-17: qty 5

## 2021-09-17 MED ORDER — KETAMINE HCL 10 MG/ML IJ SOLN
INTRAMUSCULAR | Status: DC | PRN
  Administered 2021-09-17: 25 mg via INTRAVENOUS

## 2021-09-17 MED ORDER — LACTATED RINGERS IV SOLN
INTRAVENOUS | Status: DC
  Administered 2021-09-17: 1000 mL via INTRAVENOUS

## 2021-09-17 MED ORDER — PROPOFOL 10 MG/ML IV BOLUS
INTRAVENOUS | Status: DC | PRN
  Administered 2021-09-17: 80 mg via INTRAVENOUS

## 2021-09-17 MED ORDER — PHENYLEPHRINE 40 MCG/ML (10ML) SYRINGE FOR IV PUSH (FOR BLOOD PRESSURE SUPPORT)
PREFILLED_SYRINGE | INTRAVENOUS | Status: DC | PRN
  Administered 2021-09-17: 120 ug via INTRAVENOUS

## 2021-09-17 NOTE — H&P (Signed)
@LOGO @   Primary Care Physician:  Monico Blitz, MD Primary Gastroenterologist:  Dr. Gala Romney  Pre-Procedure History & Physical: HPI:  Jennifer [name] is a 65 y.o. female here for surveillance EGD.  History of upper GI bleed for which she was evaluated in [place] 2 months ago.  Found to have reflux esophagitis and gastric erosions/ulcer in the setting of aspirin and Xarelto use.  Clinically improved.  Here for surveillance EGD.  Eliquis held 2 days ago.  Patient denies dysphagia.  Denies any NSAID use at this time.  Past Medical History:  Diagnosis Date   Arthritis    "probably; maybe in my hands" (09/21/2018)   Congestive heart failure (CHF) (HCC)    Dyspnea    Fibromyalgia    GERD (gastroesophageal reflux disease)    Hypercholesteremia    Hypertension    IBS (irritable bowel syndrome)    Mild sleep apnea    no CPAP (09/21/2018)   Myocardial infarction [facility])    "was told I have had one; never knew about it" (09/21/2018)   Nonischemic cardiomyopathy ([facility])    ECHO EF 40-45%, Septal Thickness,and mild global left ventricular dysfunction; stress test 03/2004 +VE, refersible defect; ECHO 03/2005 @[facility]  EF 55%   Pulmonary embolus (HCC)     Past Surgical History:  Procedure Laterality Date   ANKLE FRACTURE SURGERY Left 2000   BUNIONECTOMY Right 1990s?   "& removed part of my great toe due to fungus under nail"   CARDIAC CATHETERIZATION  03/2005   CATARACT EXTRACTION, BILATERAL Bilateral    COLONOSCOPY  07/2011   Dr. Britta Mccreedy at [facility] polyp found in the distal transverse colon, approximately 1 cm in size, removed.  Mild diverticulosis.  Internal hemorrhoids.  Path report available.   CORONARY ARTERY BYPASS GRAFT N/A 09/22/2018   Procedure: CORONARY ARTERY BYPASS GRAFTING (CABG), ON PUMP, TIMES TWO, USING LEFT INTERNAL MAMMARY ARTERY AND ENDOSCOPICALLY HARVESTED RIGHT GREATER SAPHENOUS VEIN;  Surgeon: Melrose Nakayama, MD;  Location: [facility];  Service: Open Heart Surgery;  Laterality: N/A;    DILATION AND CURETTAGE OF UTERUS     FRACTURE SURGERY     ICD IMPLANT N/A 07/25/2019   Procedure: ICD IMPLANT;  Surgeon: Constance Haw, MD;  Location: [facility];  Service: Cardiovascular;  Laterality: N/A;   RIGHT HEART CATH N/A 12/05/2018   Procedure: RIGHT HEART CATH;  Surgeon: Jolaine Artist, MD;  Location: [facility];  Service: Cardiovascular;  Laterality: N/A;   RIGHT/LEFT HEART CATH AND CORONARY ANGIOGRAPHY N/A 09/21/2018   Procedure: RIGHT/LEFT HEART CATH AND CORONARY ANGIOGRAPHY;  Surgeon: Troy Sine, MD;  Location: [facility];  Service: Cardiovascular;  Laterality: N/A;   TEE WITHOUT CARDIOVERSION N/A 09/22/2018   Procedure: TRANSESOPHAGEAL ECHOCARDIOGRAM (TEE);  Surgeon: Melrose Nakayama, MD;  Location: [facility];  Service: Open Heart Surgery;  Laterality: N/A;   TUBAL LIGATION      Prior to Admission medications   Medication Sig Start Date End Date Taking? Authorizing Provider  apixaban (ELIQUIS) 2.5 MG TABS tablet Take 1 tablet (2.5 mg total) by mouth 2 (two) times daily. 07/29/21  Yes Bensimhon, Shaune Pascal, MD  Ascorbic Acid (VITAMIN C) 1000 MG tablet Take 1,000 mg by mouth daily.   Yes [provider]  atorvastatin (LIPITOR) 80 MG tablet Take 1 tablet (80 mg total) by mouth daily at 6 PM. 09/28/18  Yes Harriet Pho, Tessa N, PA-C  calamine lotion Apply 1 application topically as needed for itching.  Yes [provider]  carvedilol (COREG) 25 MG tablet Take 1 tablet (25 mg total) by mouth 2 (two) times daily. 09/01/21 11/30/21 Yes Camnitz, Will Hassell Done, MD  cholecalciferol (VITAMIN D3) 25 MCG (1000 UT) tablet Take 1,000 Units by mouth daily.   Yes [provider]  dapagliflozin propanediol (FARXIGA) 10 MG TABS tablet Take 10 mg by mouth daily before breakfast. 07/24/19  Yes Bensimhon, Shaune Pascal, MD  DULoxetine (CYMBALTA) 60 MG capsule Take 60 mg by mouth 2 (two) times daily.   Yes [provider]  furosemide  (LASIX) 40 MG tablet Take 40 mg by mouth daily as needed for edema.   Yes [provider]  gabapentin (NEURONTIN) 300 MG capsule Take 600 mg by mouth 3 (three) times daily.   Yes [provider]  pantoprazole (PROTONIX) 40 MG tablet Take 1 tablet (40 mg total) by mouth 2 (two) times daily. 09/08/21  Yes Bensimhon, Shaune Pascal, MD  Phenylephrine-APAP-guaiFENesin (MUCINEX SINUS-MAX PO) Take 2 tablets by mouth daily as needed (sinus headaches).   Yes [provider]  polyvinyl alcohol (LIQUIFILM TEARS) 1.4 % ophthalmic solution Place 1 drop into both eyes as needed for dry eyes.   Yes [provider]  pramipexole (MIRAPEX) 0.5 MG tablet Take 0.5 mg by mouth at bedtime.    Yes [provider]  sacubitril-valsartan (ENTRESTO) 97-103 MG Take 1 tablet by mouth 2 (two) times daily. 08/22/19  Yes Bensimhon, Shaune Pascal, MD  spironolactone (ALDACTONE) 25 MG tablet TAKE 1 TABLET EVERY DAY 08/27/21  Yes Bensimhon, Shaune Pascal, MD  vitamin B-12 (CYANOCOBALAMIN) 500 MCG tablet Take 1 tablet (500 mcg total) by mouth daily. 12/07/18  Yes Florencia Reasons, MD    Allergies as of 08/21/2021 - Review Complete 07/29/2021  Allergen Reaction Noted   Poison oak extract Rash 08/24/2018    Family History  Problem Relation Age of Onset   Diabetes Mellitus II Mother    Heart disease Mother 23   Colon cancer Father        34   Alcohol abuse Father    Depression Father    Congestive Heart Failure Brother 43       probably dilated cardiomyopathy after PNA    Social History   Socioeconomic History   Marital status: Widowed    Spouse name: Not on file   Number of children: Not on file   Years of education: Not on file   Highest education level: Not on file  Occupational History   Not on file  Tobacco Use   Smoking status: Former    Packs/day: 1.00    Years: 30.00    Pack years: 30.00    Types: Cigarettes    Quit date: 32    Years since quitting: 30.9   Smokeless tobacco:  Never  Vaping Use   Vaping Use: Never used  Substance and Sexual Activity   Alcohol use: Not Currently   Drug use: Never   Sexual activity: Not Currently  Other Topics Concern   Not on file  Social History Narrative   Not on file   Social Determinants of Health   Financial Resource Strain: Not on file  Food Insecurity: Not on file  Transportation Needs: Not on file  Physical Activity: Not on file  Stress: Not on file  Social Connections: Not on file  Intimate Partner Violence: Not on file    Review of Systems: See HPI, otherwise negative ROS  Physical Exam: BP 119/81   Temp 98  F (36.7 C) (Oral)   Resp 18   SpO2 96%  General:   Alert,  Well-developed, well-nourished, pleasant and cooperative in NAD SNeck:  Supple; no masses or thyromegaly. No significant cervical adenopathy. Lungs:  Clear throughout to auscultation.   No wheezes, crackles, or rhonchi. No acute distress. Heart:  Regular rate and rhythm; no murmurs, clicks, rubs,  or gallops. Abdomen: Non-distended, normal bowel sounds.  Soft and nontender without appreciable mass or hepatosplenomegaly.  Pulses:  Normal pulses noted. Extremities:  Without clubbing or edema.  Impression/Plan: 65 year old lady with recent upper GI bleed found to have reflux esophagitis and gastric ulcers.  No biopsies done.  H. pylori status unknown.  She is refrain from taking any aspirin/NSAID products since her acute illness.  Denies dysphagia.  I will offer the patient a surveillance EGD with probable biopsies to at least assess for H. pylori as feasible/appropriate per plan. The risks, benefits, limitations, alternatives and imponderables have been reviewed with the patient. Potential for esophageal dilation, biopsy, etc. have also been reviewed.  Questions have been answered. All parties agreeable.      Notice: This dictation was prepared with Dragon dictation along with smaller phrase technology. Any transcriptional errors that  result from this process are unintentional and may not be corrected upon review.

## 2021-09-17 NOTE — Transfer of Care (Signed)
Immediate Anesthesia Transfer of Care Note  Patient: Jennifer [name]  Procedure(s) Performed: ESOPHAGOGASTRODUODENOSCOPY (EGD) WITH PROPOFOL BIOPSY  Patient Location: Short Stay  Anesthesia Type:General  Level of Consciousness: awake and oriented  Airway & Oxygen Therapy: Patient Spontanous Breathing  Post-op Assessment: Report given to RN and Post -op Vital signs reviewed and stable  Post vital signs: Reviewed and stable  Last Vitals:  Vitals Value Taken Time  BP 92/68 09/17/21 0956  Temp 36.7 C 09/17/21 0956  Pulse 75 09/17/21 0956  Resp 16 09/17/21 0956  SpO2 98 % 09/17/21 0956    Last Pain:  Vitals:   09/17/21 0956  TempSrc: Oral  PainSc: 0-No pain      Patients Stated Pain Goal: 6 (96/75/91 6384)  Complications: No notable events documented.

## 2021-09-17 NOTE — Anesthesia Postprocedure Evaluation (Signed)
Anesthesia Post Note  Patient: Jennifer [name]  Procedure(s) Performed: ESOPHAGOGASTRODUODENOSCOPY (EGD) WITH PROPOFOL BIOPSY  Patient location during evaluation: Phase II Anesthesia Type: General Level of consciousness: awake Pain management: pain level controlled Vital Signs Assessment: post-procedure vital signs reviewed and stable Respiratory status: spontaneous breathing and respiratory function stable Cardiovascular status: blood pressure returned to baseline and stable Postop Assessment: no headache and no apparent nausea or vomiting Anesthetic complications: no Comments: Late entry   No notable events documented.   Last Vitals:  Vitals:   09/17/21 0720 09/17/21 0956  BP: 119/81 92/68  Pulse:  75  Resp: 18 16  Temp: 36.7 C 36.7 C  SpO2: 96% 98%    Last Pain:  Vitals:   09/17/21 0956  TempSrc: Oral  PainSc: 0-No pain                 [name]

## 2021-09-17 NOTE — Discharge Instructions (Addendum)
EGD Discharge instructions Please read the instructions outlined below and refer to this sheet in the next few weeks. These discharge instructions provide you with general information on caring for yourself after you leave the hospital. Your doctor may also give you specific instructions. While your treatment has been planned according to the most current medical practices available, unavoidable complications occasionally occur. If you have any problems or questions after discharge, please call your doctor. ACTIVITY You may resume your regular activity but move at a slower pace for the next 24 hours.  Take frequent rest periods for the next 24 hours.  Walking will help expel (get rid of) the air and reduce the bloated feeling in your abdomen.  No driving for 24 hours (because of the anesthesia (medicine) used during the test).  You may shower.  Do not sign any important legal documents or operate any machinery for 24 hours (because of the anesthesia used during the test).  NUTRITION Drink plenty of fluids.  You may resume your normal diet.  Begin with a light meal and progress to your normal diet.  Avoid alcoholic beverages for 24 hours or as instructed by your caregiver.  MEDICATIONS You may resume your normal medications unless your caregiver tells you otherwise.  WHAT YOU CAN EXPECT TODAY You may experience abdominal discomfort such as a feeling of fullness or "gas" pains.  FOLLOW-UP Your doctor will discuss the results of your test with you.  SEEK IMMEDIATE MEDICAL ATTENTION IF ANY OF THE FOLLOWING OCCUR: Excessive nausea (feeling sick to your stomach) and/or vomiting.  Severe abdominal pain and distention (swelling).  Trouble swallowing.  Temperature over 101 F (37.8 C).  Rectal bleeding or vomiting of blood.    Esophagus appeared normal today.  Stomach ulcers have healed.  Stomach still looks a little irritated.  Samples taken per plan  May decrease Protonix to 40 mg once  daily in the morning before breakfast  Avoid all aspirin/NSAID medications  Further recommendations to follow pending review of pathology report  Office visit with Neil Crouch in 3 months  At patient request, I called Titus Dubin at [phone] -reviewed findings and recommendations.  May resume Eliquis today.

## 2021-09-17 NOTE — Anesthesia Preprocedure Evaluation (Signed)
Anesthesia Evaluation  Patient identified by MRN, date of birth, ID band Patient awake    Reviewed: Allergy & Precautions, H&P , NPO status , Patient's Chart, lab work & pertinent test results, reviewed documented beta blocker date and time   Airway Mallampati: II  TM Distance: >3 FB Neck ROM: full    Dental no notable dental hx.    Pulmonary sleep apnea , former smoker,    Pulmonary exam normal breath sounds clear to auscultation       Cardiovascular Exercise Tolerance: Good hypertension, + CAD, + Past MI, + CABG and +CHF   Rhythm:regular Rate:Normal     Neuro/Psych  Neuromuscular disease negative psych ROS   GI/Hepatic Neg liver ROS, PUD, GERD  Medicated,  Endo/Other  negative endocrine ROS  Renal/GU   negative genitourinary   Musculoskeletal   Abdominal   Peds  Hematology negative hematology ROS (+)   Anesthesia Other Findings 1. Left ventricular ejection fraction, by estimation, is 25 to 30%. The  left ventricle has severely decreased function. The left ventricular  internal cavity size was moderately dilated. Left ventricular diastolic  parameters are consistent with Grade II  diastolic dysfunction (pseudonormalization).  2. Right ventricular systolic function is mildly reduced. The right  ventricular size is mildly enlarged. There is normal pulmonary artery  systolic pressure.  3. Left atrial size was moderately dilated.  4. The mitral valve is normal in structure. Moderate mitral valve  regurgitation.  5. The aortic valve is tricuspid. There is mild calcification of the  aortic valve. There is mild thickening of the aortic valve. Aortic valve  regurgitation is trivial.  6. The inferior vena cava is normal in size with greater than 50%  respiratory variability, suggesting right atrial pressure of 3 mmHg.   Reproductive/Obstetrics negative OB ROS                              Anesthesia Physical Anesthesia Plan  ASA: 3  Anesthesia Plan: General   Post-op Pain Management:    Induction:   PONV Risk Score and Plan: Propofol infusion and TIVA  Airway Management Planned:   Additional Equipment:   Intra-op Plan:   Post-operative Plan:   Informed Consent: I have reviewed the patients History and Physical, chart, labs and discussed the procedure including the risks, benefits and alternatives for the proposed anesthesia with the patient or authorized representative who has indicated his/her understanding and acceptance.     Dental Advisory Given  Plan Discussed with: CRNA  Anesthesia Plan Comments:         Anesthesia Quick Evaluation

## 2021-09-17 NOTE — Anesthesia Procedure Notes (Signed)
Date/Time: 09/17/2021 9:49 AM Performed by: Orlie Dakin, CRNA Pre-anesthesia Checklist: Patient identified, Emergency Drugs available, Suction available and Patient being monitored Patient Re-evaluated:Patient Re-evaluated prior to induction Oxygen Delivery Method: Nasal cannula Induction Type: IV induction Placement Confirmation: positive ETCO2

## 2021-09-17 NOTE — Anesthesia Procedure Notes (Signed)
Date/Time: 09/17/2021 9:45 AM Performed by: Orlie Dakin, CRNA Pre-anesthesia Checklist: Patient identified, Emergency Drugs available, Suction available and Patient being monitored Patient Re-evaluated:Patient Re-evaluated prior to induction Oxygen Delivery Method: Nasal cannula Induction Type: IV induction Placement Confirmation: positive ETCO2

## 2021-09-17 NOTE — Op Note (Signed)
[facility] Patient Name: Jennifer [name] Procedure Date: 09/17/2021 9:11 AM MRN: [id number] Date of Birth: [birth year] Attending MD: Norvel Richards , MD CSN: [id number] Age: 65 Admit Type: Outpatient Procedure:                Upper GI endoscopy Indications:              Surveillance procedure, Peptic ulcer Providers:                Norvel Richards, MD, Janeece Riggers, RN, Dereck Leep, Technician Referring MD:              Medicines:                Propofol per Anesthesia Complications:            No immediate complications. Estimated Blood Loss:     Estimated blood loss was minimal. Procedure:                Pre-Anesthesia Assessment:                           - Prior to the procedure, a History and Physical                            was performed, and patient medications and                            allergies were reviewed. The patient's tolerance of                            previous anesthesia was also reviewed. The risks                            and benefits of the procedure and the sedation                            options and risks were discussed with the patient.                            All questions were answered, and informed consent                            was obtained. Prior Anticoagulants: The patient                            last took Eliquis (apixaban) 2 days prior to the                            procedure. ASA Grade Assessment: III - A patient                            with severe systemic disease. After reviewing the  risks and benefits, the patient was deemed in                            satisfactory condition to undergo the procedure.                           After obtaining informed consent, the endoscope was                            passed under direct vision. Throughout the                            procedure, the patient's blood pressure, pulse, and                             oxygen saturations were monitored continuously. The                            [id number]-[id number] ([id number]) scope was introduced through the                            mouth, and advanced to the second part of duodenum.                            The patient tolerated the procedure well. Scope In: 9:46:30 AM Scope Out: 9:51:44 AM Total Procedure Duration: 0 hours 5 minutes 14 seconds  Findings:      The examined esophagus was normal.      A small hiatal hernia was present.      Mild mucosal changes characterized by erythema and friability (with       contact bleeding) were found in the entire examined stomach.      The duodenal bulb and second portion were friable but no ulcer found.       There was a hemostasis clip still barely adherent to the mucosa at the       junction of the bulb and second portion. But barely dangling of the       duodenum were normal. Surrounding mucosa otherwise appeared normal. No       ulcer seen. Biopsies of the antrum body and fundus taken for H. pylori       testing Impression:               - Normal esophagus.                           - Small hiatal hernia.                           - Erythematous and friable (with contact bleeding)                            mucosa in the stomach.                           - Normal duodenal bulb and second portion of the  duodenum except for friability. Persisting                            hemostasis clip.                           - No specimens collected. Moderate Sedation:      Moderate (conscious) sedation was personally administered by an       anesthesia professional. The following parameters were monitored: oxygen       saturation, heart rate, blood pressure, respiratory rate, EKG, adequacy       of pulmonary ventilation, and response to care. Recommendation:           - Patient has a contact number available for                            emergencies. The signs and symptoms of  potential                            delayed complications were discussed with the                            patient. Return to normal activities tomorrow.                            Written discharge instructions were provided to the                            patient.                           - Advance diet as tolerated. Decrease Protonix to                            40 mg once daily. Avoid all NSAIDs/aspirin products                            moving forward. Follow-up on pathology. Resume                            Eliquis today. Office visit with [place] in 3 months Procedure Code(s):        --- Professional ---                           [phone], Esophagogastroduodenoscopy, flexible,                            transoral; diagnostic, including collection of                            specimen(s) by brushing or washing, when performed                            (separate procedure) Diagnosis Code(s):        --- Professional ---  K44.9, Diaphragmatic hernia without obstruction or                            gangrene                           K31.89, Other diseases of stomach and duodenum                           K92.2, Gastrointestinal hemorrhage, unspecified                           K27.9, Peptic ulcer, site unspecified, unspecified                            as acute or chronic, without hemorrhage or                            perforation CPT copyright 2019 American Medical Association. All rights reserved. The codes documented in this report are preliminary and upon coder review may  be revised to meet current compliance requirements. Cristopher Estimable. Ronin Crager, MD Norvel Richards, MD 09/17/2021 10:06:10 AM This report has been signed electronically. Number of Addenda: 0

## 2021-09-21 LAB — SURGICAL PATHOLOGY

## 2021-09-22 ENCOUNTER — Encounter: Payer: Self-pay | Admitting: Internal Medicine

## 2021-09-23 ENCOUNTER — Encounter (HOSPITAL_COMMUNITY): Payer: Self-pay | Admitting: Internal Medicine

## 2021-10-02 ENCOUNTER — Ambulatory Visit (INDEPENDENT_AMBULATORY_CARE_PROVIDER_SITE_OTHER): Payer: Medicare PPO

## 2021-10-02 DIAGNOSIS — I255 Ischemic cardiomyopathy: Secondary | ICD-10-CM

## 2021-10-02 LAB — CUP PACEART REMOTE DEVICE CHECK
Battery Remaining Longevity: 77 mo
Battery Remaining Percentage: 79 %
Battery Voltage: 2.99 V
Brady Statistic AP VP Percent: 1 %
Brady Statistic AP VS Percent: 1 %
Brady Statistic AS VP Percent: 1 %
Brady Statistic AS VS Percent: 98 %
Brady Statistic RA Percent Paced: 1 %
Brady Statistic RV Percent Paced: 1 %
Date Time Interrogation Session: 20221216020026
HighPow Impedance: 89 Ohm
HighPow Impedance: 89 Ohm
Implantable Lead Implant Date: 20201007
Implantable Lead Implant Date: 20201007
Implantable Lead Location: 753859
Implantable Lead Location: 753860
Implantable Lead Model: 7122
Implantable Pulse Generator Implant Date: 20201007
Lead Channel Impedance Value: 460 Ohm
Lead Channel Impedance Value: 680 Ohm
Lead Channel Pacing Threshold Amplitude: 0.75 V
Lead Channel Pacing Threshold Amplitude: 0.75 V
Lead Channel Pacing Threshold Pulse Width: 0.5 ms
Lead Channel Pacing Threshold Pulse Width: 0.5 ms
Lead Channel Sensing Intrinsic Amplitude: 12 mV
Lead Channel Sensing Intrinsic Amplitude: 3.2 mV
Lead Channel Setting Pacing Amplitude: 2 V
Lead Channel Setting Pacing Amplitude: 2.5 V
Lead Channel Setting Pacing Pulse Width: 0.5 ms
Lead Channel Setting Sensing Sensitivity: 0.5 mV
Pulse Gen Serial Number: 9901215

## 2021-10-05 ENCOUNTER — Other Ambulatory Visit (HOSPITAL_COMMUNITY): Payer: Self-pay

## 2021-10-06 ENCOUNTER — Other Ambulatory Visit (HOSPITAL_COMMUNITY): Payer: Self-pay

## 2021-10-06 MED ORDER — SACUBITRIL-VALSARTAN 97-103 MG PO TABS: 1.0000 | ORAL_TABLET | Freq: Two times a day (BID) | ORAL | 3 refills | Status: DC

## 2021-10-07 MED ORDER — ATORVASTATIN CALCIUM 10 MG PO TABS
ORAL_TABLET | 1 refills
Start: 2021-10-07 — End: ?

## 2021-10-08 ENCOUNTER — Telehealth (HOSPITAL_COMMUNITY): Payer: Self-pay | Admitting: Pharmacy Technician

## 2021-10-08 NOTE — Telephone Encounter (Signed)
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 Of note, patient did not provide POI. Will initially be denied.

## 2021-10-09 MED ORDER — ATORVASTATIN CALCIUM 10 MG PO TABS
ORAL_TABLET | 3 refills | Status: AC
Start: 2021-10-09 — End: ?

## 2021-10-14 NOTE — Progress Notes (Signed)
Remote ICD transmission.   

## 2021-10-15 ENCOUNTER — Other Ambulatory Visit (HOSPITAL_COMMUNITY): Payer: Self-pay | Admitting: Internal Medicine

## 2021-10-15 ENCOUNTER — Encounter: Payer: Self-pay | Admitting: Cardiology

## 2021-10-15 ENCOUNTER — Other Ambulatory Visit: Payer: Self-pay | Admitting: Cardiology

## 2021-10-15 ENCOUNTER — Telehealth (HOSPITAL_COMMUNITY): Payer: Self-pay | Admitting: Licensed Clinical Social Worker

## 2021-10-15 DIAGNOSIS — Z1331 Encounter for screening for depression: Secondary | ICD-10-CM | POA: Diagnosis not present

## 2021-10-15 DIAGNOSIS — Z2821 Immunization not carried out because of patient refusal: Secondary | ICD-10-CM | POA: Diagnosis not present

## 2021-10-15 DIAGNOSIS — I509 Heart failure, unspecified: Secondary | ICD-10-CM | POA: Diagnosis not present

## 2021-10-15 DIAGNOSIS — Z0000 Encounter for general adult medical examination without abnormal findings: Secondary | ICD-10-CM | POA: Diagnosis not present

## 2021-10-15 DIAGNOSIS — Z1339 Encounter for screening examination for other mental health and behavioral disorders: Secondary | ICD-10-CM | POA: Diagnosis not present

## 2021-10-15 DIAGNOSIS — Z7189 Other specified counseling: Secondary | ICD-10-CM | POA: Diagnosis not present

## 2021-10-15 DIAGNOSIS — I10 Essential (primary) hypertension: Secondary | ICD-10-CM | POA: Diagnosis not present

## 2021-10-15 DIAGNOSIS — Z6841 Body Mass Index (BMI) 40.0 and over, adult: Secondary | ICD-10-CM | POA: Diagnosis not present

## 2021-10-15 DIAGNOSIS — Z299 Encounter for prophylactic measures, unspecified: Secondary | ICD-10-CM | POA: Diagnosis not present

## 2021-10-15 NOTE — Telephone Encounter (Signed)
Pt called and stating she had a message from our office regarding assistance program for Entresto.  CSW stated last note from Patient Advocate indicated pt needed to provide [place] with proof of income- Pt has statement from [facility] and will plan to bring to [place].  Pt reports she only has 1.5 weeks left of Entresto- CSW reminded pt she is enrolled with [employer] till 12/31 so should call today to request a refill so she will have another 3 months of medications while they process this new application- pt expressed understanding  Will continue to follow and assist as needed  Jorge Ny, [facility] Desk#: [phone] Cell#: [phone]

## 2021-10-21 ENCOUNTER — Encounter: Payer: Medicare PPO | Admitting: Cardiology

## 2021-10-27 ENCOUNTER — Other Ambulatory Visit (HOSPITAL_COMMUNITY): Payer: Self-pay | Admitting: *Deleted

## 2021-10-27 MED ORDER — DAPAGLIFLOZIN PROPANEDIOL 10 MG PO TABS: 10.0000 mg | ORAL_TABLET | Freq: Every day | ORAL | 3 refills | Status: DC

## 2021-10-28 ENCOUNTER — Encounter: Payer: Self-pay | Admitting: Gastroenterology

## 2021-10-30 ENCOUNTER — Telehealth (HOSPITAL_COMMUNITY): Payer: Self-pay | Admitting: Pharmacist

## 2021-10-30 NOTE — Telephone Encounter (Signed)
Advanced Heart Failure Patient Advocate Encounter   Patient was approved to receive Entresto from [employer].   Patient ID: [id number] Effective dates: 10/18/21 through 10/17/22  Audry Riles, PharmD, BCPS, BCCP, CPP Heart Failure Clinic Pharmacist [phone]

## 2021-10-30 NOTE — Progress Notes (Deleted)
Electrophysiology Office Note Date: 10/30/2021  ID:  Jennifer [name], DOB 1956-08-14, MRN [id number]  PCP: Monico Blitz, MD Primary Cardiologist: Kate Sable, MD (Inactive) Electrophysiologist: Will Meredith Leeds, MD ***  CC: Routine ICD follow-up  Jennifer [name] is a 66 y.o. female seen today for Will Meredith Leeds, MD for {Blank single:19197::"cardiac clearance","post hospital follow up","acute visit due to ***","routine electrophysiology followup"}.  Since {Blank single:19197::"last being seen in our clinic","discharge from hospital"} the patient reports doing ***.  she denies chest pain, palpitations, dyspnea, PND, orthopnea, nausea, vomiting, dizziness, syncope, edema, weight gain, or early satiety. {He/she (caps):30048} has not had ICD shocks.   Device History: {Blank single:19197::"Medtronic","St. Jude","[employer]","Biotronik"} {Blank single:19197::"Dual Chamber","Single Chamber","BiV","S-ICD"} ICD implanted *** for *** History of appropriate therapy: {yes/no:20286} History of AAD therapy: {Blank single:19197::"No","Yes; currently on ***","Yes; previously tolerated ***"}   Past Medical History:  Diagnosis Date   Arthritis    "probably; maybe in my hands" (09/21/2018)   Congestive heart failure (CHF) (HCC)    Dyspnea    Fibromyalgia    GERD (gastroesophageal reflux disease)    Hypercholesteremia    Hypertension    IBS (irritable bowel syndrome)    Mild sleep apnea    no CPAP (09/21/2018)   Myocardial infarction [facility])    "was told I have had one; never knew about it" (09/21/2018)   Nonischemic cardiomyopathy ([place])    ECHO EF 40-45%, Septal Thickness,and mild global left ventricular dysfunction; stress test 03/2004 +VE, refersible defect; ECHO 03/2005 @[facility]  EF 55%   Pulmonary embolus [facility])    Past Surgical History:  Procedure Laterality Date   ANKLE FRACTURE SURGERY Left 2000   BIOPSY  09/17/2021   Procedure: BIOPSY;  Surgeon: Daneil Dolin, MD;  [facility];  Service: Endoscopy;;   BUNIONECTOMY Right 1990s?   "& removed part of my great toe due to fungus under nail"   CARDIAC CATHETERIZATION  03/2005   CATARACT EXTRACTION, BILATERAL Bilateral    COLONOSCOPY  07/2011   Dr. Britta Mccreedy at [id number] polyp found in the distal transverse colon, approximately 1 cm in size, removed.  Mild diverticulosis.  Internal hemorrhoids.  Path report available.   CORONARY ARTERY BYPASS GRAFT N/A 09/22/2018   Procedure: CORONARY ARTERY BYPASS GRAFTING (CABG), ON PUMP, TIMES TWO, USING LEFT INTERNAL MAMMARY ARTERY AND ENDOSCOPICALLY HARVESTED RIGHT GREATER SAPHENOUS VEIN;  Surgeon: Melrose Nakayama, MD;  Location: [place];  Service: Open Heart Surgery;  Laterality: N/A;   DILATION AND CURETTAGE OF UTERUS     ESOPHAGOGASTRODUODENOSCOPY (EGD) WITH PROPOFOL N/A 09/17/2021   Procedure: ESOPHAGOGASTRODUODENOSCOPY (EGD) WITH PROPOFOL;  Surgeon: Daneil Dolin, MD;  [facility];  Service: Endoscopy;  Laterality: N/A;  8:30am   FRACTURE SURGERY     ICD IMPLANT N/A 07/25/2019   Procedure: ICD IMPLANT;  Surgeon: Constance Haw, MD;  Location: [place] [facility];  Service: Cardiovascular;  Laterality: N/A;   RIGHT HEART CATH N/A 12/05/2018   Procedure: RIGHT HEART CATH;  Surgeon: Jolaine Artist, MD;  Location: [place] [facility];  Service: Cardiovascular;  Laterality: N/A;   RIGHT/LEFT HEART CATH AND CORONARY ANGIOGRAPHY N/A 09/21/2018   Procedure: RIGHT/LEFT HEART CATH AND CORONARY ANGIOGRAPHY;  Surgeon: Troy Sine, MD;  Location: [place] [facility];  Service: Cardiovascular;  Laterality: N/A;   TEE WITHOUT CARDIOVERSION N/A 09/22/2018   Procedure: TRANSESOPHAGEAL ECHOCARDIOGRAM (TEE);  Surgeon: Melrose Nakayama, MD;  Location: [place];  Service: Open Heart Surgery;  Laterality: N/A;  TUBAL LIGATION      Current Outpatient Medications  Medication Sig Dispense Refill   apixaban (ELIQUIS) 2.5 MG TABS tablet Take 1 tablet (2.5 mg  total) by mouth 2 (two) times daily. 60 tablet 11   Ascorbic Acid (VITAMIN C) 1000 MG tablet Take 1,000 mg by mouth daily.     atorvastatin (LIPITOR) 80 MG tablet Take 1 tablet (80 mg total) by mouth daily at 6 PM. 30 tablet 1   calamine lotion Apply 1 application topically as needed for itching.      carvedilol (COREG) 25 MG tablet TAKE 1 TABLET (25 MG TOTAL) BY MOUTH 2 (TWO) TIMES DAILY. KEEP UPCOMING APPT. 60 tablet 0   cholecalciferol (VITAMIN D3) 25 MCG (1000 UT) tablet Take 1,000 Units by mouth daily.     dapagliflozin propanediol (FARXIGA) 10 MG TABS tablet Take 1 tablet (10 mg total) by mouth daily before breakfast. 90 tablet 3   DULoxetine (CYMBALTA) 60 MG capsule Take 60 mg by mouth 2 (two) times daily.     furosemide (LASIX) 40 MG tablet Take 40 mg by mouth daily as needed for edema.     gabapentin (NEURONTIN) 300 MG capsule Take 600 mg by mouth 3 (three) times daily.     pantoprazole (PROTONIX) 40 MG tablet Take 1 tablet (40 mg total) by mouth 2 (two) times daily. 60 tablet 2   Phenylephrine-APAP-guaiFENesin (MUCINEX SINUS-MAX PO) Take 2 tablets by mouth daily as needed (sinus headaches).     polyvinyl alcohol (LIQUIFILM TEARS) 1.4 % ophthalmic solution Place 1 drop into both eyes as needed for dry eyes.     pramipexole (MIRAPEX) 0.5 MG tablet Take 0.5 mg by mouth at bedtime.      sacubitril-valsartan (ENTRESTO) 97-103 MG Take 1 tablet by mouth 2 (two) times daily. 180 tablet 3   spironolactone (ALDACTONE) 25 MG tablet TAKE 1 TABLET EVERY DAY 90 tablet 3   vitamin B-12 (CYANOCOBALAMIN) 500 MCG tablet Take 1 tablet (500 mcg total) by mouth daily. 30 tablet 0   No current facility-administered medications for this visit.    Allergies:   Poison oak extract   Social History: Social History   Socioeconomic History   Marital status: Widowed    Spouse name: Not on file   Number of children: Not on file   Years of education: Not on file   Highest education level: Not on file   Occupational History   Not on file  Tobacco Use   Smoking status: Former    Packs/day: 1.00    Years: 30.00    Pack years: 30.00    Types: Cigarettes    Quit date: 72    Years since quitting: 31.0   Smokeless tobacco: Never  Vaping Use   Vaping Use: Never used  Substance and Sexual Activity   Alcohol use: Not Currently   Drug use: Never   Sexual activity: Not Currently  Other Topics Concern   Not on file  Social History Narrative   Not on file   Social Determinants of Health   Financial Resource Strain: Not on file  Food Insecurity: Not on file  Transportation Needs: Not on file  Physical Activity: Not on file  Stress: Not on file  Social Connections: Not on file  Intimate Partner Violence: Not on file    Family History: Family History  Problem Relation Age of Onset   Diabetes Mellitus II Mother    Heart disease Mother 33   Colon cancer Father  79   Alcohol abuse Father    Depression Father    Congestive Heart Failure Brother 59       probably dilated cardiomyopathy after PNA    Review of Systems: All other systems reviewed and are otherwise negative except as noted above.   Physical Exam: There were no vitals filed for this visit.   GEN- The patient is well appearing, alert and oriented x 3 today.   HEENT: normocephalic, atraumatic; sclera clear, conjunctiva pink; hearing intact; oropharynx clear; neck supple, no JVP Lymph- no cervical lymphadenopathy Lungs- Clear to ausculation bilaterally, normal work of breathing.  No wheezes, rales, rhonchi Heart- Regular rate and rhythm, no murmurs, rubs or gallops, PMI not laterally displaced GI- soft, non-tender, non-distended, bowel sounds present, no hepatosplenomegaly Extremities- no clubbing or cyanosis. No edema; DP/PT/radial pulses 2+ bilaterally MS- no significant deformity or atrophy Skin- warm and dry, no rash or lesion; ICD pocket well healed Psych- euthymic mood, full affect Neuro- strength  and sensation are intact  ICD interrogation- reviewed in detail today,  See PACEART report  EKG:  EKG {ACTION; IS/IS ZOX:[id number]} ordered today. Personal review of EKG ordered {Blank single:19197::"today","***"} shows ***  Recent Labs: 07/23/2021: Hemoglobin 13.6; Platelets 227 07/29/2021: ALT 18; B Natriuretic Peptide 143.9; BUN 14; Creatinine, Ser 0.80; Potassium 4.1; Sodium 141   Wt Readings from Last 3 Encounters:  09/15/21 220 lb (99.8 kg)  07/29/21 219 lb (99.3 kg)  07/07/21 220 lb 3.2 oz (99.9 kg)     Other studies Reviewed: Additional studies/ records that were reviewed today include: Previous EP office notes.   Assessment and Plan:  1.  Chronic systolic dysfunction s/p St. Jude dual chamber ICD  euvolemic today Stable on an appropriate medical regimen Normal ICD function See Pace Art report No changes today ? Barostim ***  2. CAD Denies s/s ischemia  Current medicines are reviewed at length with the patient today.   =  Labs/ tests ordered today include: *** No orders of the defined types were placed in this encounter.    Disposition:   Follow up with {Blank single:19197::"Dr. Allred","Dr. Arlan Organ. Klein","Dr. Camnitz","Dr. Lambert","EP APP"} in {Blank single:19197::"2 weeks","4 weeks","3 months","6 months","12 months","as usual post gen change"}    Signed, Annamaria Helling  10/30/2021 10:21 AM  [facility] [address] [place]  [place] [address] [phone] (office) [phone] (fax)

## 2021-11-02 ENCOUNTER — Other Ambulatory Visit: Payer: Self-pay

## 2021-11-02 ENCOUNTER — Ambulatory Visit: Payer: Medicare Other | Admitting: Student

## 2021-11-02 ENCOUNTER — Encounter: Payer: Medicare PPO | Admitting: Student

## 2021-11-02 ENCOUNTER — Encounter: Payer: Self-pay | Admitting: Student

## 2021-11-02 VITALS — BP 110/80 | HR 65 | Ht 62.0 in | Wt 230.0 lb

## 2021-11-02 DIAGNOSIS — I2510 Atherosclerotic heart disease of native coronary artery without angina pectoris: Secondary | ICD-10-CM

## 2021-11-02 DIAGNOSIS — I5022 Chronic systolic (congestive) heart failure: Secondary | ICD-10-CM

## 2021-11-02 DIAGNOSIS — I255 Ischemic cardiomyopathy: Secondary | ICD-10-CM

## 2021-11-02 LAB — CUP PACEART INCLINIC DEVICE CHECK
Battery Remaining Longevity: 80 mo
Brady Statistic RA Percent Paced: 0.04 %
Brady Statistic RV Percent Paced: 0.1 %
Date Time Interrogation Session: 20230116123425
HighPow Impedance: 81 Ohm
Implantable Lead Implant Date: 20201007
Implantable Lead Implant Date: 20201007
Implantable Lead Location: 753859
Implantable Lead Location: 753860
Implantable Lead Model: 7122
Implantable Pulse Generator Implant Date: 20201007
Lead Channel Impedance Value: 462.5 Ohm
Lead Channel Impedance Value: 662.5 Ohm
Lead Channel Pacing Threshold Amplitude: 0.5 V
Lead Channel Pacing Threshold Amplitude: 0.5 V
Lead Channel Pacing Threshold Amplitude: 0.75 V
Lead Channel Pacing Threshold Amplitude: 0.75 V
Lead Channel Pacing Threshold Pulse Width: 0.5 ms
Lead Channel Pacing Threshold Pulse Width: 0.5 ms
Lead Channel Pacing Threshold Pulse Width: 0.5 ms
Lead Channel Pacing Threshold Pulse Width: 0.5 ms
Lead Channel Sensing Intrinsic Amplitude: 12 mV
Lead Channel Sensing Intrinsic Amplitude: 3.7 mV
Lead Channel Setting Pacing Amplitude: 2 V
Lead Channel Setting Pacing Amplitude: 2.5 V
Lead Channel Setting Pacing Pulse Width: 0.5 ms
Lead Channel Setting Sensing Sensitivity: 0.5 mV
Pulse Gen Serial Number: 9901215

## 2021-11-02 NOTE — Patient Instructions (Signed)
Medication Instructions:  Your physician recommends that you continue on your current medications as directed. Please refer to the Current Medication list given to you today.  *If you need a refill on your cardiac medications before your next appointment, please call your pharmacy*   Lab Work: None If you have labs (blood work) drawn today and your tests are completely normal, you will receive your results only by: MyChart Message (if you have MyChart) OR A paper copy in the mail If you have any lab test that is abnormal or we need to change your treatment, we will call you to review the results.   Follow-Up: At [facility], you and your health needs are our priority.  As part of our continuing mission to provide you with exceptional heart care, we have created designated Provider Care Teams.  These Care Teams include your primary Cardiologist (physician) and Advanced Practice Providers (APPs -  Physician Assistants and Nurse Practitioners) who all work together to provide you with the care you need, when you need it.   Your next appointment:   6 month(s)  The format for your next appointment:   In Person  Provider:   You may see Will Martin Camnitz, MD or one of the following Advanced Practice Providers on your designated Care Team:   Renee Ursuy, PA-C Michael "Andy" Tillery, PA-C  

## 2021-11-02 NOTE — Progress Notes (Signed)
Electrophysiology Office Note Date: 11/02/2021  ID:  [name], DOB 1956/03/07, MRN [id number]  PCP: Monico Blitz, MD Primary Cardiologist: Kate Sable, MD (Inactive) Electrophysiologist: Will Meredith Leeds, MD   CC: Routine ICD follow-up  Jennifer [name] is a 66 y.o. female seen today for Will Meredith Leeds, MD for routine electrophysiology followup.  Since last being seen in our clinic the patient reports doing very well.  she denies chest pain, palpitations, dyspnea, PND, orthopnea, nausea, vomiting, dizziness, syncope, edema, weight gain, or early satiety. She has not had ICD shocks.   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   Past Medical History:  Diagnosis Date   Arthritis    "probably; maybe in my hands" (09/21/2018)   Congestive heart failure (CHF) (HCC)    Dyspnea    Fibromyalgia    GERD (gastroesophageal reflux disease)    Hypercholesteremia    Hypertension    IBS (irritable bowel syndrome)    Mild sleep apnea    no CPAP (09/21/2018)   Myocardial infarction [facility])    "was told I have had one; never knew about it" (09/21/2018)   Nonischemic cardiomyopathy ([place])    ECHO EF 40-45%, Septal Thickness,and mild global left ventricular dysfunction; stress test 03/2004 +VE, refersible defect; ECHO 03/2005 @[facility]  EF 55%   Pulmonary embolus [facility])    Past Surgical History:  Procedure Laterality Date   ANKLE FRACTURE SURGERY Left 2000   BIOPSY  09/17/2021   Procedure: BIOPSY;  Surgeon: Daneil Dolin, MD;  [facility];  Service: Endoscopy;;   BUNIONECTOMY Right 1990s?   "& removed part of my great toe due to fungus under nail"   CARDIAC CATHETERIZATION  03/2005   CATARACT EXTRACTION, BILATERAL Bilateral    COLONOSCOPY  07/2011   Dr. Britta Mccreedy at [id number] polyp found in the distal transverse colon, approximately 1 cm in size, removed.  Mild diverticulosis.  Internal hemorrhoids.  Path report available.   CORONARY  ARTERY BYPASS GRAFT N/A 09/22/2018   Procedure: CORONARY ARTERY BYPASS GRAFTING (CABG), ON PUMP, TIMES TWO, USING LEFT INTERNAL MAMMARY ARTERY AND ENDOSCOPICALLY HARVESTED RIGHT GREATER SAPHENOUS VEIN;  Surgeon: Melrose Nakayama, MD;  Location: [place];  Service: Open Heart Surgery;  Laterality: N/A;   DILATION AND CURETTAGE OF UTERUS     ESOPHAGOGASTRODUODENOSCOPY (EGD) WITH PROPOFOL N/A 09/17/2021   Procedure: ESOPHAGOGASTRODUODENOSCOPY (EGD) WITH PROPOFOL;  Surgeon: Daneil Dolin, MD;  [facility];  Service: Endoscopy;  Laterality: N/A;  8:30am   FRACTURE SURGERY     ICD IMPLANT N/A 07/25/2019   Procedure: ICD IMPLANT;  Surgeon: Constance Haw, MD;  Location: [place] [facility];  Service: Cardiovascular;  Laterality: N/A;   RIGHT HEART CATH N/A 12/05/2018   Procedure: RIGHT HEART CATH;  Surgeon: Jolaine Artist, MD;  Location: [place] [facility];  Service: Cardiovascular;  Laterality: N/A;   RIGHT/LEFT HEART CATH AND CORONARY ANGIOGRAPHY N/A 09/21/2018   Procedure: RIGHT/LEFT HEART CATH AND CORONARY ANGIOGRAPHY;  Surgeon: Troy Sine, MD;  Location: [place] [facility];  Service: Cardiovascular;  Laterality: N/A;   TEE WITHOUT CARDIOVERSION N/A 09/22/2018   Procedure: TRANSESOPHAGEAL ECHOCARDIOGRAM (TEE);  Surgeon: Melrose Nakayama, MD;  Location: [place];  Service: Open Heart Surgery;  Laterality: N/A;   TUBAL LIGATION      Current Outpatient Medications  Medication Sig Dispense Refill   apixaban (ELIQUIS) 2.5 MG TABS tablet Take 1 tablet (2.5 mg total) by mouth  2 (two) times daily. 60 tablet 11   Ascorbic Acid (VITAMIN C) 1000 MG tablet Take 1,000 mg by mouth daily.     atorvastatin (LIPITOR) 80 MG tablet Take 1 tablet (80 mg total) by mouth daily at 6 PM. 30 tablet 1   calamine lotion Apply 1 application topically as needed for itching.      carvedilol (COREG) 25 MG tablet TAKE 1 TABLET (25 MG TOTAL) BY MOUTH 2 (TWO) TIMES DAILY. KEEP UPCOMING APPT. 60  tablet 0   cholecalciferol (VITAMIN D3) 25 MCG (1000 UT) tablet Take 1,000 Units by mouth daily.     dapagliflozin propanediol (FARXIGA) 10 MG TABS tablet Take 1 tablet (10 mg total) by mouth daily before breakfast. 90 tablet 3   DULoxetine (CYMBALTA) 60 MG capsule Take 60 mg by mouth 2 (two) times daily.     furosemide (LASIX) 40 MG tablet Take 40 mg by mouth daily as needed for edema.     gabapentin (NEURONTIN) 300 MG capsule Take 600 mg by mouth 3 (three) times daily.     pantoprazole (PROTONIX) 40 MG tablet Take 1 tablet (40 mg total) by mouth 2 (two) times daily. 60 tablet 2   Phenylephrine-APAP-guaiFENesin (MUCINEX SINUS-MAX PO) Take 2 tablets by mouth daily as needed (sinus headaches).     polyvinyl alcohol (LIQUIFILM TEARS) 1.4 % ophthalmic solution Place 1 drop into both eyes as needed for dry eyes.     pramipexole (MIRAPEX) 0.5 MG tablet Take 0.5 mg by mouth at bedtime.      sacubitril-valsartan (ENTRESTO) 97-103 MG Take 1 tablet by mouth 2 (two) times daily. 180 tablet 3   spironolactone (ALDACTONE) 25 MG tablet TAKE 1 TABLET EVERY DAY 90 tablet 3   vitamin B-12 (CYANOCOBALAMIN) 500 MCG tablet Take 1 tablet (500 mcg total) by mouth daily. 30 tablet 0   No current facility-administered medications for this visit.    Allergies:   Poison oak extract   Social History: Social History   Socioeconomic History   Marital status: Widowed    Spouse name: Not on file   Number of children: Not on file   Years of education: Not on file   Highest education level: Not on file  Occupational History   Not on file  Tobacco Use   Smoking status: Former    Packs/day: 1.00    Years: 30.00    Pack years: 30.00    Types: Cigarettes    Quit date: 64    Years since quitting: 31.0   Smokeless tobacco: Never  Vaping Use   Vaping Use: Never used  Substance and Sexual Activity   Alcohol use: Not Currently   Drug use: Never   Sexual activity: Not Currently  Other Topics Concern   Not on  file  Social History Narrative   Not on file   Social Determinants of Health   Financial Resource Strain: Not on file  Food Insecurity: Not on file  Transportation Needs: Not on file  Physical Activity: Not on file  Stress: Not on file  Social Connections: Not on file  Intimate Partner Violence: Not on file    Family History: Family History  Problem Relation Age of Onset   Diabetes Mellitus II Mother    Heart disease Mother 63   Colon cancer Father        52   Alcohol abuse Father    Depression Father    Congestive Heart Failure Brother 23       probably  dilated cardiomyopathy after PNA    Review of Systems: All other systems reviewed and are otherwise negative except as noted above.   Physical Exam: There were no vitals filed for this visit.   GEN- The patient is well appearing, alert and oriented x 3 today.   HEENT: normocephalic, atraumatic; sclera clear, conjunctiva pink; hearing intact; oropharynx clear; neck supple, no JVP Lymph- no cervical lymphadenopathy Lungs- Clear to ausculation bilaterally, normal work of breathing.  No wheezes, rales, rhonchi Heart- Regular rate and rhythm, no murmurs, rubs or gallops, PMI not laterally displaced GI- soft, non-tender, non-distended, bowel sounds present, no hepatosplenomegaly Extremities- no clubbing or cyanosis. No edema; DP/PT/radial pulses 2+ bilaterally MS- no significant deformity or atrophy Skin- warm and dry, no rash or lesion; ICD pocket well healed Psych- euthymic mood, full affect Neuro- strength and sensation are intact  ICD interrogation- reviewed in detail today,  See PACEART report  EKG:  EKG is ordered today. Personal review of EKG ordered today shows NSR 65  Recent Labs: 07/23/2021: Hemoglobin 13.6; Platelets 227 07/29/2021: ALT 18; B Natriuretic Peptide 143.9; BUN 14; Creatinine, Ser 0.80; Potassium 4.1; Sodium 141   Wt Readings from Last 3 Encounters:  09/15/21 220 lb (99.8 kg)  07/29/21 219 lb  (99.3 kg)  07/07/21 220 lb 3.2 oz (99.9 kg)     Other studies Reviewed: Additional studies/ records that were reviewed today include: Previous EP office notes.   Assessment and Plan:  1.  Chronic systolic dysfunction s/p St. Jude dual chamber ICD  euvolemic today Stable on an appropriate medical regimen Normal ICD function See Pace Art report No changes today Stable NYHA II-III symptoms currently without undue SOB or fatigue. Consider Barostim if symptoms worsen.   2. CAD Denies s/s ischemia  Current medicines are reviewed at length with the patient today.    Disposition:   Follow up with Dr. Curt Bears in 6 months    Signed, Shirley Friar, PA-C  11/02/2021 9:47 AM  [facility] [address] [place] [address] [phone] (office) [phone] (fax)

## 2021-11-12 ENCOUNTER — Ambulatory Visit: Payer: Medicare PPO | Admitting: Gastroenterology

## 2022-01-01 ENCOUNTER — Ambulatory Visit (INDEPENDENT_AMBULATORY_CARE_PROVIDER_SITE_OTHER): Payer: Medicare Other

## 2022-01-01 DIAGNOSIS — I255 Ischemic cardiomyopathy: Secondary | ICD-10-CM

## 2022-01-01 DIAGNOSIS — I5022 Chronic systolic (congestive) heart failure: Secondary | ICD-10-CM

## 2022-01-01 LAB — CUP PACEART REMOTE DEVICE CHECK
Battery Remaining Longevity: 76 mo
Battery Remaining Percentage: 77 %
Battery Voltage: 2.99 V
Brady Statistic AP VP Percent: 1 %
Brady Statistic AP VS Percent: 1 %
Brady Statistic AS VP Percent: 1 %
Brady Statistic AS VS Percent: 98 %
Brady Statistic RA Percent Paced: 1 %
Brady Statistic RV Percent Paced: 1 %
Date Time Interrogation Session: 20230317020015
HighPow Impedance: 88 Ohm
HighPow Impedance: 88 Ohm
Implantable Lead Implant Date: 20201007
Implantable Lead Implant Date: 20201007
Implantable Lead Location: 753859
Implantable Lead Location: 753860
Implantable Lead Model: 7122
Implantable Pulse Generator Implant Date: 20201007
Lead Channel Impedance Value: 450 Ohm
Lead Channel Impedance Value: 690 Ohm
Lead Channel Pacing Threshold Amplitude: 0.5 V
Lead Channel Pacing Threshold Amplitude: 0.75 V
Lead Channel Pacing Threshold Pulse Width: 0.5 ms
Lead Channel Pacing Threshold Pulse Width: 0.5 ms
Lead Channel Sensing Intrinsic Amplitude: 12 mV
Lead Channel Sensing Intrinsic Amplitude: 2.5 mV
Lead Channel Setting Pacing Amplitude: 2 V
Lead Channel Setting Pacing Amplitude: 2.5 V
Lead Channel Setting Pacing Pulse Width: 0.5 ms
Lead Channel Setting Sensing Sensitivity: 0.5 mV
Pulse Gen Serial Number: 9901215

## 2022-01-08 NOTE — Progress Notes (Signed)
Remote ICD transmission.   

## 2022-01-22 ENCOUNTER — Ambulatory Visit: Payer: Medicare PPO | Admitting: Gastroenterology

## 2022-02-02 ENCOUNTER — Encounter: Payer: Self-pay | Admitting: Internal Medicine

## 2022-02-13 ENCOUNTER — Telehealth: Payer: Self-pay | Admitting: Medical

## 2022-02-13 MED ORDER — SACUBITRIL-VALSARTAN 97-103 MG PO TABS: 1.0000 | ORAL_TABLET | Freq: Two times a day (BID) | ORAL | 3 refills | Status: DC

## 2022-02-13 NOTE — Telephone Encounter (Signed)
? ?  Patient called the after hours line to requesting a refill of her entresto. Refill sent to preferred pharmacy. ? ?Abigail Butts, PA-C ?02/13/22; 12:54 PM ? ?

## 2022-02-15 ENCOUNTER — Ambulatory Visit: Payer: Self-pay | Admitting: Gastroenterology

## 2022-03-16 ENCOUNTER — Encounter: Payer: Self-pay | Admitting: Gastroenterology

## 2022-03-16 ENCOUNTER — Ambulatory Visit: Payer: Self-pay | Admitting: Gastroenterology

## 2022-03-16 ENCOUNTER — Ambulatory Visit: Payer: Medicare Other | Admitting: Gastroenterology

## 2022-03-16 VITALS — BP 118/64 | HR 74 | Temp 97.3°F | Ht 62.0 in | Wt 232.6 lb

## 2022-03-16 DIAGNOSIS — Z800 Family history of malignant neoplasm of digestive organs: Secondary | ICD-10-CM | POA: Insufficient documentation

## 2022-03-16 DIAGNOSIS — K264 Chronic or unspecified duodenal ulcer with hemorrhage: Secondary | ICD-10-CM

## 2022-03-16 DIAGNOSIS — Z86010 Personal history of colonic polyps: Secondary | ICD-10-CM | POA: Diagnosis not present

## 2022-03-16 NOTE — Patient Instructions (Signed)
Decrease pantoprazole to '40mg'$  once daily 30 minutes before breakfast.  Virtual visit in 05/2022 and at that time we will schedule you for your colonoscopy for 06/2022.  Call with any questions or concerns in the interim.

## 2022-03-16 NOTE — Progress Notes (Signed)
GI Office Note    Referring Provider: Monico Blitz, MD Primary Care Physician:  Monico Blitz, MD  Primary Gastroenterologist: Garfield Cornea, MD   Chief Complaint   Chief Complaint  Patient presents with   Follow-up    No current issues to discuss    History of Present Illness   Jennifer [name] is a 66 y.o. female presenting today for follow up of gastric/duodenal ulcers. Since her last visit she completed surveillance EGD to verify ulcer healing. See EGD findings below.   Some days has some fatigue but some days normal. No lightheadedness. [place]. Does have some DOE with big inclines. Able to do fine on flat incline. No melena, brbpr. No abdominal pain. Always has had occasional diarrhea. Usually food related. No heartburn. No dysphagia. IBS much less of issue since husband has passed. Less stress with no longer managing his illnesses. After about six months after his death, stopped having frequent diarrhea.   Father had colon cancer, age 72. Previously she had mentioned her last colonoscopy being done in [place]. In 07/2011 there is path in [place] from transverse colon polyp removed at time of colonoscopy by Dr. Britta Mccreedy at [facility]. It was a tubular adenoma.  She believes this was from last colonoscopy.  EGD 09/2021: -normal esophagus -Small hiatal hernia. -Erythematous and friable (with contact bleeding) mucosa in the stomach. -Normal duodenal bulb and second portion of the duodenum except for friability. -Persisting hemostasis clip. -gastric antral and oxyntic mucosa with no significant histopathologic change. Negative for H.pylori.      Medications   Current Outpatient Medications  Medication Sig Dispense Refill   apixaban (ELIQUIS) 2.5 MG TABS tablet Take 1 tablet (2.5 mg total) by mouth 2 (two) times daily. 60 tablet 11   Ascorbic Acid (VITAMIN C) 1000 MG tablet Take 1,000 mg by mouth daily.     atorvastatin (LIPITOR) 80 MG  tablet Take 1 tablet (80 mg total) by mouth daily at 6 PM. 30 tablet 1   calamine lotion Apply 1 application topically as needed for itching.      carvedilol (COREG) 25 MG tablet TAKE 1 TABLET (25 MG TOTAL) BY MOUTH 2 (TWO) TIMES DAILY. KEEP UPCOMING APPT. 60 tablet 0   cholecalciferol (VITAMIN D3) 25 MCG (1000 UT) tablet Take 1,000 Units by mouth daily.     dapagliflozin propanediol (FARXIGA) 10 MG TABS tablet Take 1 tablet (10 mg total) by mouth daily before breakfast. 90 tablet 3   DULoxetine (CYMBALTA) 60 MG capsule Take 60 mg by mouth 2 (two) times daily.     furosemide (LASIX) 40 MG tablet Take 40 mg by mouth daily as needed for edema.     gabapentin (NEURONTIN) 300 MG capsule Take 600 mg by mouth 3 (three) times daily.     pantoprazole (PROTONIX) 40 MG tablet Take 1 tablet (40 mg total) by mouth 2 (two) times daily. 60 tablet 2   Phenylephrine-APAP-guaiFENesin (MUCINEX SINUS-MAX PO) Take 2 tablets by mouth daily as needed (sinus headaches).     polyvinyl alcohol (LIQUIFILM TEARS) 1.4 % ophthalmic solution Place 1 drop into both eyes as needed for dry eyes.     pramipexole (MIRAPEX) 0.5 MG tablet Take 0.5 mg by mouth at bedtime.      sacubitril-valsartan (ENTRESTO) 97-103 MG Take 1 tablet by mouth 2 (two) times daily. 180 tablet 3   spironolactone (ALDACTONE) 25 MG tablet TAKE 1 TABLET EVERY DAY 90 tablet 3  vitamin B-12 (CYANOCOBALAMIN) 500 MCG tablet Take 1 tablet (500 mcg total) by mouth daily. 30 tablet 0   No current facility-administered medications for this visit.    Allergies   Allergies as of 03/16/2022 - Review Complete 03/16/2022  Allergen Reaction Noted   Poison oak extract Rash 08/24/2018       Review of Systems   General: Negative for anorexia, weight loss, fever, chills, fatigue, weakness. ENT: Negative for hoarseness, difficulty swallowing, nasal congestion. CV: Negative for chest pain, angina, palpitations, dyspnea on exertion, peripheral edema.  Respiratory:  Negative for dyspnea at rest, dyspnea on exertion, cough, sputum, wheezing.  GI: See history of present illness. GU:  Negative for dysuria, hematuria, urinary incontinence, urinary frequency, nocturnal urination.  Endo: Negative for unusual weight change.     Physical Exam   BP 118/64 (BP Location: Right Arm, Patient Position: Sitting, Cuff Size: Large)   Pulse 74   Temp (!) 97.3 F (36.3 C) (Temporal)   Ht '5\' 2"'$  (1.575 m)   Wt 232 lb 9.6 oz (105.5 kg)   SpO2 95%   BMI 42.54 kg/m    General: Well-nourished, well-developed in no acute distress.  Eyes: No icterus. Mouth: Oropharyngeal mucosa moist and pink , no lesions erythema or exudate. Lungs: Clear to auscultation bilaterally.  Heart: Regular rate and rhythm, no murmurs rubs or gallops.  Abdomen: Bowel sounds are normal, nontender, nondistended, no hepatosplenomegaly or masses,  no abdominal bruits or hernia , no rebound or guarding.  Rectal: not performed  Extremities: No lower extremity edema. No clubbing or deformities. Neuro: Alert and oriented x 4   Skin: Warm and dry, no jaundice.   Psych: Alert and cooperative, normal mood and affect.  Labs   Lab Results  Component Value Date   WBC 5.1 07/23/2021   HGB 13.6 07/23/2021   HCT 42.3 07/23/2021   MCV 98 (H) 07/23/2021   PLT 227 07/23/2021    Imaging Studies   No results found.  Assessment   H/O GI bleed: bleeding from duodenal ulcer with visible vessel back in 06/2021 while in [place]. Also noted to have nonbleeding gastric ulcers. EGD in 09/2021 showed ulcer healing, endoclip still present, gastric biopsies negative for H.pylori. EGD 06/2021 did show LA grade C esophagitis as well but patient denies having had typical heartburn.   H/O adenomatous colon polyps and FH CRC: Father had colon cancer at age 33. She is overdue for surveillance colonoscopy for history of colonic adenomas. She wants to wait until 06/2022 due to having busy summer plans.   PLAN   Wants to  have her colonoscopy in September. We will arrange for virtual visit first of 05/2022. Reduce pantoprazole to once daily. Consider maintaining at the once daily chronically due to reflux esophagitis (silent reflux).    Laureen Ochs. Bobby Rumpf, [place], [facility]

## 2022-04-02 ENCOUNTER — Ambulatory Visit (INDEPENDENT_AMBULATORY_CARE_PROVIDER_SITE_OTHER): Payer: Medicare Other

## 2022-04-02 DIAGNOSIS — I255 Ischemic cardiomyopathy: Secondary | ICD-10-CM

## 2022-04-03 LAB — CUP PACEART REMOTE DEVICE CHECK
Battery Remaining Longevity: 73 mo
Battery Remaining Percentage: 74 %
Battery Voltage: 2.98 V
Brady Statistic AP VP Percent: 1 %
Brady Statistic AP VS Percent: 1 %
Brady Statistic AS VP Percent: 1 %
Brady Statistic AS VS Percent: 98 %
Brady Statistic RA Percent Paced: 1 %
Brady Statistic RV Percent Paced: 1 %
Date Time Interrogation Session: 20230616021337
HighPow Impedance: 95 Ohm
HighPow Impedance: 95 Ohm
Implantable Lead Implant Date: 20201007
Implantable Lead Implant Date: 20201007
Implantable Lead Location: 753859
Implantable Lead Location: 753860
Implantable Lead Model: 7122
Implantable Pulse Generator Implant Date: 20201007
Lead Channel Impedance Value: 460 Ohm
Lead Channel Impedance Value: 650 Ohm
Lead Channel Pacing Threshold Amplitude: 0.5 V
Lead Channel Pacing Threshold Amplitude: 0.75 V
Lead Channel Pacing Threshold Pulse Width: 0.5 ms
Lead Channel Pacing Threshold Pulse Width: 0.5 ms
Lead Channel Sensing Intrinsic Amplitude: 12 mV
Lead Channel Sensing Intrinsic Amplitude: 3.4 mV
Lead Channel Setting Pacing Amplitude: 2 V
Lead Channel Setting Pacing Amplitude: 2.5 V
Lead Channel Setting Pacing Pulse Width: 0.5 ms
Lead Channel Setting Sensing Sensitivity: 0.5 mV
Pulse Gen Serial Number: 9901215

## 2022-04-08 NOTE — Progress Notes (Signed)
Remote ICD transmission.   

## 2022-05-06 ENCOUNTER — Other Ambulatory Visit (HOSPITAL_COMMUNITY): Payer: Self-pay

## 2022-05-06 MED ORDER — DAPAGLIFLOZIN PROPANEDIOL 10 MG PO TABS: 10.0000 mg | ORAL_TABLET | Freq: Every day | ORAL | 3 refills | Status: DC

## 2022-05-17 ENCOUNTER — Telehealth: Payer: Self-pay

## 2022-05-17 NOTE — Telephone Encounter (Signed)
Pt called requesting a refill on her pantoprazole. Pt states that she had to go back to taking it twice daily due to once daily not working. Pt is needing this sent to [facility] in [place]. Pt was last seen on 03/16/2022.

## 2022-05-26 MED ORDER — PANTOPRAZOLE SODIUM 40 MG PO TBEC: 40.0000 mg | DELAYED_RELEASE_TABLET | Freq: Two times a day (BID) | ORAL | 1 refills | Status: DC

## 2022-05-26 NOTE — Telephone Encounter (Signed)
Rx done. 

## 2022-05-26 NOTE — Addendum Note (Signed)
Addended by: Mahala Menghini on: 05/26/2022 01:36 PM   Modules accepted: Orders

## 2022-06-09 ENCOUNTER — Telehealth: Payer: PRIVATE HEALTH INSURANCE | Attending: Student in an Organized Health Care Education/Training Program

## 2022-06-09 DIAGNOSIS — U071 COVID-19: Secondary | ICD-10-CM

## 2022-06-09 MED ORDER — NIRMATRELVIR&RITONAVIR 300/100 20 X 150 MG & 10 X 100MG PO TBPK
0 refills | Status: AC
Start: 2022-06-09 — End: ?

## 2022-06-09 MED ORDER — IBUPROFEN 800 MG PO TABS
800 mg | ORAL_TABLET | Freq: Four times a day (QID) | ORAL | 0 refills | Status: AC | PRN
Start: 2022-06-09 — End: ?

## 2022-06-09 NOTE — Progress Notes
[facility] HEALTH TELEMEDICINE COVID-19 (VIDEO) NOTE  Patient: Kerry Baxter  MRN: [id number]  Date of Service: 06/09/2022  Primary Care Physician: Gregary Cromer., MD  CC: Concern for COVID-19 and associated symptoms    History of Present Illness:   Kerry Baxter is a 66 y.o. female presenting with concern for COVID-19 and associated symptoms.    Symptom history, exposure history, and other risk factors detailed below:  Symptoms started 1   days ago.     High Risk Symptoms  []  Cough  [x]  Fever  []  Shortness of Breath  []  GI symptoms (abdominal pain, diarrhea)    Risk Factors  []  Exposure to confirmed COVID-19 within 14 days prior to symptom onset.  []  Research scientist (physical sciences)  []  Immunocompromised or with high risk medical condition  [x]  60 years or older  [x]  Lives with others who are immunocompromised or with high risk medical condition  [x]  Group Living home (nursing home, dorms, etc)    Review of Systems  Check if present, blank if patient denies symptoms:  [x]  fever, []  chills, []  nausea, []  emesis, []  po intolerance, []  change in mentation, []  SOB, []  abdominal pain, []  any significant change in bowel function. All other systems negative except as documented in HPI.    History:     Current Outpatient Medications:   ?  ATORVASTATIN 10 mg tablet  ?  fluticasone (FLONASE ALLERGY RELIEF) 50 mcg/act nasal spray  ?  ibuprofen 800 mg tablet  ?  nirmatrelvir & ritonavir (PAXLOVID) 20 x 150 mg & 10 x 100 mg tablet  Allergies   Allergen Reactions   ? Sulfa Antibiotics      Rash       Patient Active Problem List   Diagnosis   ? Hyperlipidemia   ? Routine general medical examination at a health care facility   ? Preventative health care   ? SK (seborrheic keratosis)   ? End of life care       Past medical history, past surgical history, family history, and social history all reviewed, updated, and noted in HPI if significant.     Physical Exam:     (The following were examined and were normal unless noted otherwise)  Gen  [x]  Appearance [x]  Alert    Eyes  [x]  Conjunctiva  []  Pupils  []  Sclera   ENT  [x]  Oropharynx  []  Nasal     Resp  [x]  Effort     Skin  []  Color  []  No rashes    Neuro  [x]  Oriented  [x]   Speech  [x]  Movement     Pertinent + findings (if any):       Assessment & Plan:   Kerry Baxter is a 66 y.o. female presenting with concern for COVID-19 and associated symptoms.    There are no diagnoses linked to this encounter.  Orders Placed This Encounter   ? ibuprofen 800 mg tablet   ? nirmatrelvir & ritonavir (PAXLOVID) 20 x 150 mg & 10 x 100 mg tablet     []  Patient referred for COVID testing.  []  Patient directed to go immediately to the local Emergency Department for further evaluation and possible hospitalization.  []  Patient provided with information regarding the need to isolate at home, social distancing, practice strict hygiene, and monitor for signs and symptoms of worsening health conditions including respiratory difficulties.  [x]  Additional education provided to patient on symptom management and follow up should they develop worsening symptoms including respiratory difficulties. Patient  was also advised to inform healthcare providers and emergency responders of their current screening status should additional treatment be needed.  [x]  Risks/benefits of medications discussed.  [x]  >50% of this 15 mins visit was spent on direct patient care, including counseling and/or coordination of care for the problems listed within my assessment and plan.    The plan of care, diagnosis, orders, risks and benefits related to the medical issues pertinent to this encounter, and follow-up recommendations were discussed with the patient. Questions related to the recommended plan of care were answered. See AVS for additional information and counseling materials provided to the patient.    Time of note filed does not necessarily reflect the time of encounter.  Portions of this note may have been created with voice recognition software. Occasional wrong-word or ''sound-alike'' substitutions may have occurred due to the inherent limitations of voice recognition software. Please read the chart carefully and recognize, using context, where these substitutions have occurred.    Patient Consent to Telehealth Questionnaire       04/30/2019     2:40 PM   [facility] TELEHEALTH PRECHECKIN QUESTIONS   By clicking ''I Agree'', I consent to the below:  I Agree     - I agree  to be treated via a video visit and acknowledge that I may be liable for any relevant copays or coinsurance depending on my insurance plan.  - I understand that this video visit is offered for my convenience and I am able to cancel and reschedule for an in-person appointment if I desire.  - I also acknowledge that sensitive medical information may be discussed during this video visit appointment and that it is my responsibility to locate myself in a location that ensures privacy to my own level of comfort.  - I also acknowledge that I should not be participating in a video visit in a way that could cause danger to myself or to those around me (such as driving or walking).  If my provider is concerned about my safety, I understand that they have the right to terminate the visit.     --  Author:  Arlee Muslim, MD  06/09/2022 at 11:13 AM

## 2022-06-16 ENCOUNTER — Telehealth: Payer: Self-pay

## 2022-06-16 ENCOUNTER — Encounter: Payer: Self-pay | Admitting: Gastroenterology

## 2022-06-16 ENCOUNTER — Telehealth (INDEPENDENT_AMBULATORY_CARE_PROVIDER_SITE_OTHER): Payer: Medicare Other | Admitting: Gastroenterology

## 2022-06-16 VITALS — Ht 63.0 in | Wt 216.0 lb

## 2022-06-16 DIAGNOSIS — Z800 Family history of malignant neoplasm of digestive organs: Secondary | ICD-10-CM | POA: Diagnosis not present

## 2022-06-16 DIAGNOSIS — Z86010 Personal history of colonic polyps: Secondary | ICD-10-CM | POA: Diagnosis not present

## 2022-06-16 DIAGNOSIS — K264 Chronic or unspecified duodenal ulcer with hemorrhage: Secondary | ICD-10-CM | POA: Diagnosis not present

## 2022-06-16 DIAGNOSIS — K2100 Gastro-esophageal reflux disease with esophagitis, without bleeding: Secondary | ICD-10-CM | POA: Diagnosis not present

## 2022-06-16 DIAGNOSIS — K219 Gastro-esophageal reflux disease without esophagitis: Secondary | ICD-10-CM | POA: Insufficient documentation

## 2022-06-16 MED ORDER — PANTOPRAZOLE SODIUM 40 MG PO TBEC
40.0000 mg | DELAYED_RELEASE_TABLET | Freq: Two times a day (BID) | ORAL | 1 refills | Status: DC
Start: 2022-06-16 — End: 2022-08-31

## 2022-06-16 NOTE — Telephone Encounter (Signed)
[name], you are scheduled for a virtual visit with your provider today.  Just as we do with appointments in the office, we must obtain your consent to participate.  Your consent will be active for this visit and any virtual visit you may have with one of our providers in the next 365 days.  If you have a MyChart account, I can also send a copy of this consent to you electronically.  All virtual visits are billed to your insurance company just like a traditional visit in the office.  As this is a virtual visit, video technology does not allow for your provider to perform a traditional examination.  This may limit your provider's ability to fully assess your condition.  If your provider identifies any concerns that need to be evaluated in person or the need to arrange testing such as labs, EKG, etc, we will make arrangements to do so.  Although advances in technology are sophisticated, we cannot ensure that it will always work on either your end or our end.  If the connection with a video visit is poor, we may have to switch to a telephone visit.  With either a video or telephone visit, we are not always able to ensure that we have a secure connection.   I need to obtain your verbal consent now.   Are you willing to proceed with your visit today? Yes.

## 2022-06-16 NOTE — Progress Notes (Signed)
Primary Care Physician:  Monico Blitz, MD Primary GI:  Garfield Cornea, MD Patient Location: Parked car Provider Location: [facility] office Reason for Phone Visit: schedule colonoscopy  Persons present on the phone encounter, with roles: Patient, myself (provider),Tammy Clifton CMA (updated meds and allergies)  Total time (minutes) spent on medical discussion: 11 minutes  Due to COVID-19, visit was conducted using telephonic method (no video was available).  Visit was requested by patient.  Virtual Visit via Telephone only  I connected with Jennifer [name] on 06/16/22 at 11:30 AM EDT by telephone and verified that I am speaking with the correct person using two identifiers.   I discussed the limitations, risks, security and privacy concerns of performing an evaluation and management service by telephone and the availability of in person appointments. I also discussed with the patient that there may be a patient responsible charge related to this service. The patient expressed understanding and agreed to proceed.   HPI:   Patient is a pleasant 66 y/o female who presents for telephone visit regarding scheduling colonoscopy.  Last seen in May 2023.  She has a history of gastric/duodenal ulcers, IBS.  Presented with bleeding duodenal ulcer with visible vessel while in [place].  Also with LA grade C esophagitis at that time.  Patient was asymptomatic from a reflux standpoint.  We have requested keeping her on a daily PPI because of silent reflux.  Surveillance EGD verified ulcer healing, see EGD findings below.  She has a history of adenomatous colon polyps, last colonoscopy in 2012.  Father had colon cancer at age 86.  Doing well. She notes when she tried to get by with only one pantoprazole dose per day, by day 3 she started having stomach pain. Symptoms resolved after resuming BID dosing.  BM 3 per day. Soft/formed. No black or bloody stools. Sometimes if out working outside in the heat then  hemorrhoids get aggravating and will bleed a bit.   EGD 09/2021: -normal esophagus -Small hiatal hernia. -Erythematous and friable (with contact bleeding) mucosa in the stomach. -Normal duodenal bulb and second portion of the duodenum except for friability. -Persisting hemostasis clip. -gastric antral and oxyntic mucosa with no significant histopathologic change. Negative for H.pylori.   Current Outpatient Medications  Medication Sig Dispense Refill   albuterol (VENTOLIN HFA) 108 (90 Base) MCG/ACT inhaler Inhale into the lungs.     apixaban (ELIQUIS) 2.5 MG TABS tablet Take 1 tablet (2.5 mg total) by mouth 2 (two) times daily. 60 tablet 11   Ascorbic Acid (VITAMIN C) 1000 MG tablet Take 1,000 mg by mouth daily.     atorvastatin (LIPITOR) 80 MG tablet Take 1 tablet (80 mg total) by mouth daily at 6 PM. 30 tablet 1   calamine lotion Apply 1 application topically as needed for itching.      carvedilol (COREG) 25 MG tablet TAKE 1 TABLET (25 MG TOTAL) BY MOUTH 2 (TWO) TIMES DAILY. KEEP UPCOMING APPT. 60 tablet 0   cholecalciferol (VITAMIN D3) 25 MCG (1000 UT) tablet Take 1,000 Units by mouth daily.     dapagliflozin propanediol (FARXIGA) 10 MG TABS tablet Take 1 tablet (10 mg total) by mouth daily before breakfast. 90 tablet 3   DULoxetine (CYMBALTA) 60 MG capsule Take 60 mg by mouth 2 (two) times daily.     furosemide (LASIX) 40 MG tablet Take 40 mg by mouth daily as needed for edema.     gabapentin (NEURONTIN) 300 MG capsule Take 600 mg by  mouth 3 (three) times daily.     Phenylephrine-APAP-guaiFENesin (MUCINEX SINUS-MAX PO) Take 2 tablets by mouth daily as needed (sinus headaches).     polyvinyl alcohol (LIQUIFILM TEARS) 1.4 % ophthalmic solution Place 1 drop into both eyes as needed for dry eyes.     pramipexole (MIRAPEX) 0.5 MG tablet Take 0.5 mg by mouth at bedtime.      sacubitril-valsartan (ENTRESTO) 97-103 MG Take 1 tablet by mouth 2 (two) times daily. 180 tablet 3   spironolactone  (ALDACTONE) 25 MG tablet TAKE 1 TABLET EVERY DAY 90 tablet 3   vitamin B-12 (CYANOCOBALAMIN) 500 MCG tablet Take 1 tablet (500 mcg total) by mouth daily. 30 tablet 0   pantoprazole (PROTONIX) 40 MG tablet Take 1 tablet (40 mg total) by mouth 2 (two) times daily before a meal. 180 tablet 1   No current facility-administered medications for this visit.    Past Medical History:  Diagnosis Date   Arthritis    "probably; maybe in my hands" (09/21/2018)   Congestive heart failure (CHF) (HCC)    Dyspnea    Fibromyalgia    GERD (gastroesophageal reflux disease)    Hypercholesteremia    Hypertension    IBS (irritable bowel syndrome)    Mild sleep apnea    no CPAP (09/21/2018)   Myocardial infarction [facility]    "was told I have had one; never knew about it" (09/21/2018)   Nonischemic cardiomyopathy ([place])    ECHO EF 40-45%, Septal Thickness,and mild global left ventricular dysfunction; stress test 03/2004 +VE, refersible defect; ECHO 03/2005 '@[facility]  EF 55%   Pulmonary embolus [facility])     Past Surgical History:  Procedure Laterality Date   ANKLE FRACTURE SURGERY Left 2000   BIOPSY  09/17/2021   Procedure: BIOPSY;  Surgeon: Daneil Dolin, MD;  [facility];  Service: Endoscopy;;   BUNIONECTOMY Right 1990s?   "& removed part of my great toe due to fungus under nail"   CARDIAC CATHETERIZATION  03/2005   CATARACT EXTRACTION, BILATERAL Bilateral    COLONOSCOPY  07/2011   Dr. Britta Mccreedy at [facility]:WCHENID polyp found in the distal transverse colon, approximately 1 cm in size, removed.  Mild diverticulosis.  Internal hemorrhoids.  Path report available.   CORONARY ARTERY BYPASS GRAFT N/A 09/22/2018   Procedure: CORONARY ARTERY BYPASS GRAFTING (CABG), ON PUMP, TIMES TWO, USING LEFT INTERNAL MAMMARY ARTERY AND ENDOSCOPICALLY HARVESTED RIGHT GREATER SAPHENOUS VEIN;  Surgeon: Melrose Nakayama, MD;  Location: [place];  Service: Open Heart Surgery;  Laterality: N/A;   DILATION AND CURETTAGE OF UTERUS      ESOPHAGOGASTRODUODENOSCOPY (EGD) WITH PROPOFOL N/A 09/17/2021   Procedure: ESOPHAGOGASTRODUODENOSCOPY (EGD) WITH PROPOFOL;  Surgeon: Daneil Dolin, MD;  [facility];  Service: Endoscopy;  Laterality: N/A;  8:30am   FRACTURE SURGERY     ICD IMPLANT N/A 07/25/2019   Procedure: ICD IMPLANT;  Surgeon: Constance Haw, MD;  Location: [place] CV LAB;  Service: Cardiovascular;  Laterality: N/A;   RIGHT HEART CATH N/A 12/05/2018   Procedure: RIGHT HEART CATH;  Surgeon: Jolaine Artist, MD;  Location: [place] CV LAB;  Service: Cardiovascular;  Laterality: N/A;   RIGHT/LEFT HEART CATH AND CORONARY ANGIOGRAPHY N/A 09/21/2018   Procedure: RIGHT/LEFT HEART CATH AND CORONARY ANGIOGRAPHY;  Surgeon: Troy Sine, MD;  Location: [place] [facility];  Service: Cardiovascular;  Laterality: N/A;   TEE WITHOUT CARDIOVERSION N/A 09/22/2018   Procedure: TRANSESOPHAGEAL ECHOCARDIOGRAM (TEE);  Surgeon: Melrose Nakayama, MD;  [facility];  Service: Open Heart Surgery;  Laterality: N/A;   TUBAL LIGATION      Family History  Problem Relation Age of Onset   Diabetes Mellitus II Mother    Heart disease Mother 5   Colon cancer Father        58   Alcohol abuse Father    Depression Father    Congestive Heart Failure Brother 53       probably dilated cardiomyopathy after PNA    Social History   Socioeconomic History   Marital status: Widowed    Spouse name: Not on file   Number of children: Not on file   Years of education: Not on file   Highest education level: Not on file  Occupational History   Not on file  Tobacco Use   Smoking status: Former    Packs/day: 1.00    Years: 30.00    Total pack years: 30.00    Types: Cigarettes    Quit date: 58    Years since quitting: 31.6   Smokeless tobacco: Never  Vaping Use   Vaping Use: Never used  Substance and Sexual Activity   Alcohol use: Not Currently   Drug use: Never   Sexual activity: Not Currently  Other  Topics Concern   Not on file  Social History Narrative   Not on file   Social Determinants of Health   Financial Resource Strain: Not on file  Food Insecurity: Not on file  Transportation Needs: Not on file  Physical Activity: Not on file  Stress: Not on file  Social Connections: Not on file  Intimate Partner Violence: Not on file    ROS:  General: Negative for anorexia, weight loss, fever, chills, fatigue, weakness. Eyes: Negative for vision changes.  ENT: Negative for hoarseness, difficulty swallowing , nasal congestion. CV: Negative for chest pain, angina, palpitations, dyspnea on exertion, peripheral edema.  Respiratory: Negative for dyspnea at rest, dyspnea on exertion, cough, sputum, wheezing.  GI: See history of present illness. GU:  Negative for dysuria, hematuria, urinary incontinence, urinary frequency, nocturnal urination.  MS: Negative for joint pain, low back pain.  Derm: Negative for rash or itching.  Neuro: Negative for weakness, abnormal sensation, seizure, frequent headaches, memory loss, confusion.  Psych: Negative for anxiety, depression, suicidal ideation, hallucinations.  Endo: Negative for unusual weight change.  Heme: Negative for bruising or bleeding. Allergy: Negative for rash or hives.   Observations/Objective:  Alert and oriented. No shortness of breath. Otherwise exam unavailable by phone.   Assessment and Plan:  History of GI bleed: Bleeding from duodenal ulcer with visible vessel back in September 2022 while in [place].  Also noted to have nonbleeding gastric ulcers at that time.  EGD in December 2022 showed ulcer healing, Endo Clip still present, gastric biopsies negative for H. pylori.  She also had LA grade C esophagitis without typical reflux symptoms.  She has been doing well since her last visit she did try backing down to once daily PPI but developed recurrent abdominal pain.  Symptoms resolved after resuming twice daily dosing.  Continue to  follow. Etiology of ulcers remain unclear without history of NSAID use and H.pylori testing not done at time of first EGD and negative of bx at time of second EGD. Would recommend serologies for H.pylori to be complete.   History of adenomatous colon polyps and family history of colon cancer (father at an advanced age): Overdue for colonoscopy.  Colonoscopy in the near future.  ASA 3.  I have discussed the risks, alternatives, benefits with regards to but not limited to the risk of reaction to medication, bleeding, infection, perforation and the patient is agreeable to proceed. Written consent to be obtained. She will hold Eliquis 48 hours prior to procedure. Hold Farxiga 72 hours prior to procedure.  Follow Up Instructions:    I discussed the assessment and treatment plan with the patient. The patient was provided an opportunity to ask questions and all were answered. The patient agreed with the plan and demonstrated an understanding of the instructions. AVS mailed to patient's home address.   The patient was advised to call back or seek an in-person evaluation if the symptoms worsen or if the condition fails to improve as anticipated.  I provided 11 minutes of non-face-to-face time during this encounter.   Neil Crouch, PA-C

## 2022-06-16 NOTE — Patient Instructions (Addendum)
Colonoscopy to be done in the near future.  See separate instructions. Continue pantoprazole 40 mg twice daily.  Call with any recurrent abdominal pain, black or bloody stools.  No indication to repeat upper endoscopy at this time but would consider if developed recurrent symptoms. After reviewing your chart, there is still not an obvious cause for your ulcers last year. I would recommend checking for H.pylori. We will make arrangements for labs.

## 2022-06-29 ENCOUNTER — Encounter: Payer: Self-pay | Admitting: *Deleted

## 2022-06-29 ENCOUNTER — Telehealth: Payer: Self-pay | Admitting: *Deleted

## 2022-06-29 MED ORDER — PEG 3350-KCL-NA BICARB-NACL 420 G PO SOLR: 4000.0000 mL | Freq: Once | ORAL | 0 refills | Status: AC

## 2022-06-29 NOTE — Telephone Encounter (Signed)
PA:  APPROVED Authorization #: [id number]  DOS: 08/13/22-10/17/22

## 2022-07-02 ENCOUNTER — Ambulatory Visit (INDEPENDENT_AMBULATORY_CARE_PROVIDER_SITE_OTHER): Payer: Medicare Other

## 2022-07-02 DIAGNOSIS — I255 Ischemic cardiomyopathy: Secondary | ICD-10-CM

## 2022-07-03 LAB — CUP PACEART REMOTE DEVICE CHECK
Battery Remaining Longevity: 71 mo
Battery Remaining Percentage: 73 %
Battery Voltage: 2.98 V
Brady Statistic AP VP Percent: 1 %
Brady Statistic AP VS Percent: 1 %
Brady Statistic AS VP Percent: 1 %
Brady Statistic AS VS Percent: 98 %
Brady Statistic RA Percent Paced: 1 %
Brady Statistic RV Percent Paced: 1 %
Date Time Interrogation Session: 20230915024609
HighPow Impedance: 90 Ohm
HighPow Impedance: 90 Ohm
Implantable Lead Implant Date: 20201007
Implantable Lead Implant Date: 20201007
Implantable Lead Location: 753859
Implantable Lead Location: 753860
Implantable Lead Model: 7122
Implantable Pulse Generator Implant Date: 20201007
Lead Channel Impedance Value: 450 Ohm
Lead Channel Impedance Value: 680 Ohm
Lead Channel Pacing Threshold Amplitude: 0.5 V
Lead Channel Pacing Threshold Amplitude: 0.75 V
Lead Channel Pacing Threshold Pulse Width: 0.5 ms
Lead Channel Pacing Threshold Pulse Width: 0.5 ms
Lead Channel Sensing Intrinsic Amplitude: 12 mV
Lead Channel Sensing Intrinsic Amplitude: 2.9 mV
Lead Channel Setting Pacing Amplitude: 2 V
Lead Channel Setting Pacing Amplitude: 2.5 V
Lead Channel Setting Pacing Pulse Width: 0.5 ms
Lead Channel Setting Sensing Sensitivity: 0.5 mV
Pulse Gen Serial Number: 9901215

## 2022-07-13 NOTE — Progress Notes (Signed)
Remote ICD transmission.   

## 2022-07-29 ENCOUNTER — Telehealth (HOSPITAL_COMMUNITY): Payer: Self-pay | Admitting: Pharmacy Technician

## 2022-07-29 NOTE — Telephone Encounter (Signed)
Advanced Heart Failure Patient Advocate Encounter   Patient was automatically approved to receive Farxiga from [employer] through 10/18/23.  Document scanned to chart. Patient will be notified via mail. Patient currently using [employer] assistance through previous enrollment end date. No further action needed at this time.  Charlann Boxer, CPhT

## 2022-08-09 NOTE — Patient Instructions (Signed)
Jennifer [name]  08/09/2022     '@PREFPERIOPPHARMACY'$ @   Your procedure is scheduled on  08/13/2022.   Report to [facility] at  [email or web address]   Call this number if you have problems the morning of surgery:  [phone]  If you experience any cold or flu symptoms such as cough, fever, chills, shortness of breath, etc. between now and your scheduled surgery, please notify us at the above number.   Remember:       Follow the diet and prep instructions given to you by the office.       Your last dose of farxiga should be on 10/23.        Your last dose of eliquis should be on 10/24.   Use your inhaler before you come and bring your rescue inhaler  with you.    Take these medicines the morning of surgery with A SIP OF WATER                       carvedilol, cymbalta, gabapentin, protonix.     Do not wear jewelry, make-up or nail polish.  Do not wear lotions, powders, or perfumes, or deodorant.  Do not shave 48 hours prior to surgery.  Men may shave face and neck.  Do not bring valuables to the hospital.  [facility] is not responsible for any belongings or valuables.  Contacts, dentures or bridgework may not be worn into surgery.  Leave your suitcase in the car.  After surgery it may be brought to your room.  For patients admitted to the hospital, discharge time will be determined by your treatment team.  Patients discharged the day of surgery will not be allowed to drive home and must have someone with them for 24 hours.    Special instructions:   DO NOT smoke tobacco or vape for 24 hours before your procedure.  Please read over the following fact sheets that you were given. Anesthesia Post-op Instructions and Care and Recovery After Surgery      Colonoscopy, Adult, Care After The following information offers guidance on how to care for yourself after your procedure. Your health care provider may also give you more specific instructions. If you have problems or  questions, contact your health care provider. What can I expect after the procedure? After the procedure, it is common to have: A small amount of blood in your stool for 24 hours after the procedure. Some gas. Mild cramping or bloating of your abdomen. Follow these instructions at home: Eating and drinking  Drink enough fluid to keep your urine pale yellow. Follow instructions from your health care provider about eating or drinking restrictions. Resume your normal diet as told by your health care provider. Avoid heavy or fried foods that are hard to digest. Activity Rest as told by your health care provider. Avoid sitting for a long time without moving. Get up to take short walks every 1-2 hours. This is important to improve blood flow and breathing. Ask for help if you feel weak or unsteady. Return to your normal activities as told by your health care provider. Ask your health care provider what activities are safe for you. Managing cramping and bloating  Try walking around when you have cramps or feel bloated. If directed, apply heat to your abdomen as told by your health care provider. Use the heat source that your health care provider recommends, such as a moist  heat pack or a heating pad. Place a towel between your skin and the heat source. Leave the heat on for 20-30 minutes. Remove the heat if your skin turns bright red. This is especially important if you are unable to feel pain, heat, or cold. You have a greater risk of getting burned. General instructions If you were given a sedative during the procedure, it can affect you for several hours. Do not drive or operate machinery until your health care provider says that it is safe. For the first 24 hours after the procedure: Do not sign important documents. Do not drink alcohol. Do your regular daily activities at a slower pace than normal. Eat soft foods that are easy to digest. Take over-the-counter and prescription medicines  only as told by your health care provider. Keep all follow-up visits. This is important. Contact a health care provider if: You have blood in your stool 2-3 days after the procedure. Get help right away if: You have more than a small spotting of blood in your stool. You have large blood clots in your stool. You have swelling of your abdomen. You have nausea or vomiting. You have a fever. You have increasing pain in your abdomen that is not relieved with medicine. These symptoms may be an emergency. Get help right away. Call 911. Do not wait to see if the symptoms will go away. Do not drive yourself to the hospital. Summary After the procedure, it is common to have a small amount of blood in your stool. You may also have mild cramping and bloating of your abdomen. If you were given a sedative during the procedure, it can affect you for several hours. Do not drive or operate machinery until your health care provider says that it is safe. Get help right away if you have a lot of blood in your stool, nausea or vomiting, a fever, or increased pain in your abdomen. This information is not intended to replace advice given to you by your health care provider. Make sure you discuss any questions you have with your health care provider. Document Revised: 05/27/2021 Document Reviewed: 05/27/2021 Elsevier Patient Education  [place] After This sheet gives you information about how to care for yourself after your procedure. Your health care provider may also give you more specific instructions. If you have problems or questions, contact your health care provider. What can I expect after the procedure? After the procedure, it is common to have: Tiredness. Forgetfulness about what happened after the procedure. Impaired judgment for important decisions. Nausea or vomiting. Some difficulty with balance. Follow these instructions at home: For the time period  you were told by your health care provider:     Rest as needed. Do not participate in activities where you could fall or become injured. Do not drive or use machinery. Do not drink alcohol. Do not take sleeping pills or medicines that cause drowsiness. Do not make important decisions or sign legal documents. Do not take care of children on your own. Eating and drinking Follow the diet that is recommended by your health care provider. Drink enough fluid to keep your urine pale yellow. If you vomit: Drink water, juice, or soup when you can drink without vomiting. Make sure you have little or no nausea before eating solid foods. General instructions Have a responsible adult stay with you for the time you are told. It is important to have someone help care for you until you are  awake and alert. Take over-the-counter and prescription medicines only as told by your health care provider. If you have sleep apnea, surgery and certain medicines can increase your risk for breathing problems. Follow instructions from your health care provider about wearing your sleep device: Anytime you are sleeping, including during daytime naps. While taking prescription pain medicines, sleeping medicines, or medicines that make you drowsy. Avoid smoking. Keep all follow-up visits as told by your health care provider. This is important. Contact a health care provider if: You keep feeling nauseous or you keep vomiting. You feel light-headed. You are still sleepy or having trouble with balance after 24 hours. You develop a rash. You have a fever. You have redness or swelling around the IV site. Get help right away if: You have trouble breathing. You have new-onset confusion at home. Summary For several hours after your procedure, you may feel tired. You may also be forgetful and have poor judgment. Have a responsible adult stay with you for the time you are told. It is important to have someone help care for  you until you are awake and alert. Rest as told. Do not drive or operate machinery. Do not drink alcohol or take sleeping pills. Get help right away if you have trouble breathing, or if you suddenly become confused. This information is not intended to replace advice given to you by your health care provider. Make sure you discuss any questions you have with your health care provider. Document Revised: 09/08/2021 Document Reviewed: 09/06/2019 Elsevier Patient Education  [place].

## 2022-08-11 ENCOUNTER — Encounter (HOSPITAL_COMMUNITY): Payer: Self-pay

## 2022-08-11 ENCOUNTER — Encounter (HOSPITAL_COMMUNITY)
Admission: RE | Admit: 2022-08-11 | Discharge: 2022-08-11 | Disposition: A | Discharge status: Home / Self Care | Payer: Medicare Other | Source: Ambulatory Visit | Attending: Internal Medicine | Admitting: Internal Medicine

## 2022-08-11 ENCOUNTER — Telehealth: Payer: Self-pay | Admitting: *Deleted

## 2022-08-11 DIAGNOSIS — Z79899 Other long term (current) drug therapy: Secondary | ICD-10-CM

## 2022-08-11 NOTE — Pre-Procedure Instructions (Signed)
Messaged Mindy at Dr Roseanne Kaufman office to let her know patient was a no show for her pre-op this morning.

## 2022-08-11 NOTE — Telephone Encounter (Signed)
Informed pt will call once we get providers schedule. She can't do pre-op on Tuesday or Wednesday due to working

## 2022-08-11 NOTE — Telephone Encounter (Signed)
Pt informed that she missed her pre-op appointment today and she says she couldn't make it due to having to work. She would like to reschedule in December.    TCS w/Dr. Gala Romney, ASA 3, [facility]:FYBOF polyps

## 2022-08-16 ENCOUNTER — Ambulatory Visit: Payer: Medicare Other | Attending: Cardiology | Admitting: Cardiology

## 2022-08-16 ENCOUNTER — Encounter: Payer: Self-pay | Admitting: Cardiology

## 2022-08-16 DIAGNOSIS — I5022 Chronic systolic (congestive) heart failure: Secondary | ICD-10-CM

## 2022-08-16 DIAGNOSIS — Z95810 Presence of automatic (implantable) cardiac defibrillator: Secondary | ICD-10-CM

## 2022-08-16 DIAGNOSIS — I255 Ischemic cardiomyopathy: Secondary | ICD-10-CM

## 2022-08-16 NOTE — Progress Notes (Signed)
Electrophysiology Office Note   Date:  08/16/2022   ID:  Jennifer, [name] 1956-01-13, MRN [id number]  PCP:  Monico Blitz, MD  Cardiologist: [place] Primary Electrophysiologist:  Starling Christofferson Meredith Leeds, MD    Chief Complaint: CHF   History of Present Illness: Jennifer [name] is a 66 y.o. female who is being seen today for the evaluation of CHF at the request of Monico Blitz, MD. Presenting today for electrophysiology evaluation.  She has a history significant for DVT/PE, fibromyalgia, systolic heart failure.  Left heart catheterization 2019 showed 90% proximal LAD and 50% circumflex stenosis.  She is status post CABG x2 on 09/22/2018.  Her ejection fraction remained low.  She is status post [place] ICD implanted 07/25/2019.  Today, denies symptoms of palpitations, chest pain, shortness of breath, orthopnea, PND, lower extremity edema, claudication, dizziness, presyncope, syncope, bleeding, or neurologic sequela. The patient is tolerating medications without difficulties.     Past Medical History:  Diagnosis Date   Arthritis    "probably; maybe in my hands" (09/21/2018)   Congestive heart failure (CHF) (HCC)    Dyspnea    Fibromyalgia    GERD (gastroesophageal reflux disease)    Hypercholesteremia    Hypertension    IBS (irritable bowel syndrome)    Mild sleep apnea    no CPAP (09/21/2018)   Myocardial infarction [facility])    "was told I have had one; never knew about it" (09/21/2018)   Nonischemic cardiomyopathy ([place])    ECHO EF 40-45%, Septal Thickness,and mild global left ventricular dysfunction; stress test 03/2004 +VE, refersible defect; ECHO 03/2005 '@[facility]  EF 55%   Pulmonary embolus [facility])    Past Surgical History:  Procedure Laterality Date   ANKLE FRACTURE SURGERY Left 2000   BIOPSY  09/17/2021   Procedure: BIOPSY;  Surgeon: Daneil Dolin, MD;  [facility];  Service: Endoscopy;;   BUNIONECTOMY Right 1990s?   "& removed part of my great toe due to fungus  under nail"   CARDIAC CATHETERIZATION  03/2005   CATARACT EXTRACTION, BILATERAL Bilateral    COLONOSCOPY  07/2011   Dr. Britta Mccreedy at [facility] polyp found in the distal transverse colon, approximately 1 cm in size, removed.  Mild diverticulosis.  Internal hemorrhoids.  Path report available.   CORONARY ARTERY BYPASS GRAFT N/A 09/22/2018   Procedure: CORONARY ARTERY BYPASS GRAFTING (CABG), ON PUMP, TIMES TWO, USING LEFT INTERNAL MAMMARY ARTERY AND ENDOSCOPICALLY HARVESTED RIGHT GREATER SAPHENOUS VEIN;  Surgeon: Melrose Nakayama, MD;  Location: [place];  Service: Open Heart Surgery;  Laterality: N/A;   DILATION AND CURETTAGE OF UTERUS     ESOPHAGOGASTRODUODENOSCOPY (EGD) WITH PROPOFOL N/A 09/17/2021   Procedure: ESOPHAGOGASTRODUODENOSCOPY (EGD) WITH PROPOFOL;  Surgeon: Daneil Dolin, MD;  [facility];  Service: Endoscopy;  Laterality: N/A;  8:30am   FRACTURE SURGERY     ICD IMPLANT N/A 07/25/2019   Procedure: ICD IMPLANT;  Surgeon: Constance Haw, MD;  Location: [place] CV LAB;  Service: Cardiovascular;  Laterality: N/A;   RIGHT HEART CATH N/A 12/05/2018   Procedure: RIGHT HEART CATH;  Surgeon: Jolaine Artist, MD;  Location: [place] CV LAB;  Service: Cardiovascular;  Laterality: N/A;   RIGHT/LEFT HEART CATH AND CORONARY ANGIOGRAPHY N/A 09/21/2018   Procedure: RIGHT/LEFT HEART CATH AND CORONARY ANGIOGRAPHY;  Surgeon: Troy Sine, MD;  Location: [place] CV LAB;  Service: Cardiovascular;  Laterality: N/A;   TEE WITHOUT CARDIOVERSION N/A 09/22/2018   Procedure: TRANSESOPHAGEAL ECHOCARDIOGRAM (TEE);  Surgeon: Melrose Nakayama, MD;  Location: [place];  Service: Open Heart Surgery;  Laterality: N/A;   TUBAL LIGATION       Current Outpatient Medications  Medication Sig Dispense Refill   albuterol (VENTOLIN HFA) 108 (90 Base) MCG/ACT inhaler Inhale 1-2 puffs into the lungs every 6 (six) hours as needed for shortness of breath or wheezing.     apixaban  (ELIQUIS) 2.5 MG TABS tablet Take 1 tablet (2.5 mg total) by mouth 2 (two) times daily. 60 tablet 11   Ascorbic Acid (VITAMIN C) 1000 MG tablet Take 1,000 mg by mouth daily.     atorvastatin (LIPITOR) 80 MG tablet Take 1 tablet (80 mg total) by mouth daily at 6 PM. (Patient taking differently: Take 80 mg by mouth in the morning.) 30 tablet 1   calamine lotion Apply 1 application topically as needed for itching.      carvedilol (COREG) 25 MG tablet TAKE 1 TABLET (25 MG TOTAL) BY MOUTH 2 (TWO) TIMES DAILY. KEEP UPCOMING APPT. 60 tablet 0   cholecalciferol (VITAMIN D3) 25 MCG (1000 UT) tablet Take 1,000 Units by mouth daily.     dapagliflozin propanediol (FARXIGA) 10 MG TABS tablet Take 1 tablet (10 mg total) by mouth daily before breakfast. 90 tablet 3   DULoxetine (CYMBALTA) 60 MG capsule Take 60 mg by mouth 2 (two) times daily.     furosemide (LASIX) 40 MG tablet Take 40 mg by mouth daily as needed for edema.     gabapentin (NEURONTIN) 300 MG capsule Take 600 mg by mouth 3 (three) times daily.     pantoprazole (PROTONIX) 40 MG tablet Take 1 tablet (40 mg total) by mouth 2 (two) times daily before a meal. 180 tablet 1   Phenylephrine-APAP-guaiFENesin (MUCINEX SINUS-MAX PO) Take 2 tablets by mouth daily as needed (sinus headaches).     polyethylene glycol-electrolytes (NULYTELY) 420 g solution Take 4,000 mLs by mouth once.     polyvinyl alcohol (LIQUIFILM TEARS) 1.4 % ophthalmic solution Place 1 drop into both eyes as needed for dry eyes.     pramipexole (MIRAPEX) 0.5 MG tablet Take 0.5 mg by mouth at bedtime.      sacubitril-valsartan (ENTRESTO) 97-103 MG Take 1 tablet by mouth 2 (two) times daily. 180 tablet 3   spironolactone (ALDACTONE) 25 MG tablet TAKE 1 TABLET EVERY DAY 90 tablet 3   vitamin B-12 (CYANOCOBALAMIN) 500 MCG tablet Take 1 tablet (500 mcg total) by mouth daily. 30 tablet 0   No current facility-administered medications for this visit.    Allergies:   Poison oak extract    Social History:  The patient  reports that she quit smoking about 31 years ago. Her smoking use included cigarettes. She has a 30.00 pack-year smoking history. She has never used smokeless tobacco. She reports that she does not currently use alcohol. She reports that she does not use drugs.   Family History:  The patient's family history includes Alcohol abuse in her father; Colon cancer in her father; Congestive Heart Failure (age of onset: 6) in her brother; Depression in her father; Diabetes Mellitus II in her mother; Heart disease (age of onset: 49) in her mother.    ROS:  Please see the history of present illness.   Otherwise, review of systems is positive for none.   All other systems are reviewed and negative.   PHYSICAL EXAM: VS:  There were no vitals taken for this visit. , BMI There is no height or weight  on file to calculate BMI. GEN: Well nourished, well developed, in no acute distress  HEENT: normal  Neck: no JVD, carotid bruits, or masses Cardiac: RRR; no murmurs, rubs, or gallops,no edema  Respiratory:  clear to auscultation bilaterally, normal work of breathing GI: soft, nontender, nondistended, + BS MS: no deformity or atrophy  Skin: warm and dry, device site well healed Neuro:  Strength and sensation are intact Psych: euthymic mood, full affect  EKG:  EKG is not ordered today. Personal review of the ekg ordered 11/02/21 shows sinus rhythm, rate 65  Personal review of the device interrogation today. Results in [place]: No results found for requested labs within last 365 days.    Lipid Panel     Component Value Date/Time   CHOL 126 07/29/2021 1146   TRIG 105 07/29/2021 1146   HDL 49 07/29/2021 1146   CHOLHDL 2.6 07/29/2021 1146   VLDL 21 07/29/2021 1146   LDLCALC 56 07/29/2021 1146     Wt Readings from Last 3 Encounters:  06/16/22 216 lb (98 kg)  03/16/22 232 lb 9.6 oz (105.5 kg)  11/02/21 230 lb (104.3 kg)      Other studies  Reviewed: Additional studies/ records that were reviewed today include: TTE 07/24/20  Review of the above records today demonstrates:   1. Left ventricular ejection fraction, by estimation, is 25 to 30%. The  left ventricle has severely decreased function. The left ventricular  internal cavity size was moderately dilated. Left ventricular diastolic  parameters are consistent with Grade II  diastolic dysfunction (pseudonormalization).   2. Right ventricular systolic function is mildly reduced. The right  ventricular size is mildly enlarged. There is normal pulmonary artery  systolic pressure.   3. Left atrial size was moderately dilated.   4. The mitral valve is normal in structure. Moderate mitral valve  regurgitation.   5. The aortic valve is tricuspid. There is mild calcification of the  aortic valve. There is mild thickening of the aortic valve. Aortic valve  regurgitation is trivial.   6. The inferior vena cava is normal in size with greater than 50%  respiratory variability, suggesting right atrial pressure of 3 mmHg.   Cardiac monitor personally reviewed  Predominant rhythm sinus rhythm Average heart rate 65 22 VT runs Longest and fastest interval 12 beats with a heart rate of 179 bpm 2.8% PVCs, rare PACs  ASSESSMENT AND PLAN:  1.  Chronic systolic heart failure: Due to ischemic and nonischemic cardiomyopathy.  Currently on optimal medical therapy with Entresto, Lasix, carvedilol, Aldactone.  Status post [place] ICD implanted 07/25/2019.  Device functioning appropriately.  No changes.  2.  Coronary artery disease: Status post CABG.  No current chest pain.  Continue statin and Xarelto.  3.  PVCs: 2.8% on most recent monitor.  No changes.  4.  Obstructive sleep apnea: CPAP compliance encouraged   Current medicines are reviewed at length with the patient today.   The patient does not have concerns regarding her medicines.  The following changes were made today:  none  Labs/ tests ordered today include:  No orders of the defined types were placed in this encounter.   Disposition:   FU with Annaleah Arata 12 months  Signed, Liandra Mendia Meredith Leeds, MD  08/16/2022 3:19 PM     [address] [place] [address] [phone] (office) [phone] (fax)

## 2022-08-16 NOTE — Patient Instructions (Signed)
Medication Instructions:  Your physician recommends that you continue on your current medications as directed. Please refer to the Current Medication list given to you today.  *If you need a refill on your cardiac medications before your next appointment, please call your pharmacy*   Lab Work: None ordered.  If you have labs (blood work) drawn today and your tests are completely normal, you will receive your results only by: [place] (if you have MyChart) OR A paper copy in the mail If you have any lab test that is abnormal or we need to change your treatment, we will call you to review the results.   Testing/Procedures: None ordered.    Follow-Up: At [facility], you and your health needs are our priority.  As part of our continuing mission to provide you with exceptional heart care, we have created designated Provider Care Teams.  These Care Teams include your primary Cardiologist (physician) and Advanced Practice Providers (APPs -  Physician Assistants and Nurse Practitioners) who all work together to provide you with the care you need, when you need it.  We recommend signing up for the patient portal called "MyChart".  Sign up information is provided on this After Visit Summary.  MyChart is used to connect with patients for Virtual Visits (Telemedicine).  Patients are able to view lab/test results, encounter notes, upcoming appointments, etc.  Non-urgent messages can be sent to your provider as well.   To learn more about what you can do with MyChart, go to [email or web address].    Your next appointment:   12 months with Dr Curt Bears PA  Please schedule an appointment with [facility]  Important Information About [place]

## 2022-08-17 ENCOUNTER — Other Ambulatory Visit (HOSPITAL_COMMUNITY): Payer: Self-pay | Admitting: Internal Medicine

## 2022-08-20 ENCOUNTER — Other Ambulatory Visit (HOSPITAL_COMMUNITY): Payer: Self-pay | Admitting: *Deleted

## 2022-08-20 DIAGNOSIS — I5022 Chronic systolic (congestive) heart failure: Secondary | ICD-10-CM

## 2022-08-20 NOTE — Telephone Encounter (Signed)
Called pt. Offered several dates with Dr. Gala Romney but could not do any of those. She asked to schedule with Dr. Abbey Chatters. She has been scheduled for 12/20 at 11am. Aware will mail new instructions/pre-op. Already has prep.   PA done via [facility]. Approved. Auth# [id number], DOS: Oct 06, 2022 - Oct 17, 2022

## 2022-08-23 ENCOUNTER — Encounter (INDEPENDENT_AMBULATORY_CARE_PROVIDER_SITE_OTHER): Payer: Self-pay | Admitting: *Deleted

## 2022-08-31 ENCOUNTER — Other Ambulatory Visit: Payer: Self-pay | Admitting: Gastroenterology

## 2022-09-01 ENCOUNTER — Ambulatory Visit: Payer: PRIVATE HEALTH INSURANCE

## 2022-09-01 DIAGNOSIS — E782 Mixed hyperlipidemia: Secondary | ICD-10-CM

## 2022-09-01 NOTE — Interdisciplinary
MEDICARE ANNUAL WELLNESS VISIT    Visual Acuity Yes    Vaccinations Up To Date  Yes    Other MDs you have seen in the past year:   -  dermatology   -   -    Alcohol Screen:    Do you drink alcohol? Yes    How much daily/weekly?   - 0-2 glasses of win  What time is your first drink?   - evening time    Depression Screen:  During the last month have you been feeling down, depressed or hopeless?    No    Have you noticed less interest or pleasure in doing things? No      Fall Risk Assessment:  Do you use an assistive device to walk?  No    Do you ambulate/walk well?  Yes    Do you think you are at risk for a fall?   No    Have you fallen recently?   No    Advanced Directive:  No   Document provided:   No

## 2022-09-01 NOTE — Progress Notes
PATIENT:  Kerry Baxter   MRN:  [id number]  DOB:  December 05, 1955  DATE OF SERVICE:  09/01/2022  INITIAL MEDICARE EXAM      PRIMARY CARE PROVIDER: Gregary Cromer., MD    CC:   Chief Complaint   Patient presents with   ? Annual Exam        Subjective:     HPI: Kerry Baxter is a 66 y.o. female presenting today for     ANNUAL MEDICARE WELLNESS VISIT INCLUDED REVIEW OF ALL CHRONIC MEDICAL CONDITIONS, ACUTE MEDICAL ISSUES, ADL, IADL, FALL RISK, HOME SAFETY, MENTAL HEALTH REVIEW, NUTRITIONAL REVIEW.  SEE MY DOCUMENTATION ABOVE AND NURSING NOTES  FOR EXTRAPOLATED DETAILS.   No additional contributory past medical, surgical, family or social history except as documented in care connect.   ROS: 14-Review of Systems - negative    Allergies   Allergen Reactions   ? Sulfa Antibiotics      Rash         Medications:  Outpatient Medications Prior to Visit   Medication Sig   ? ATORVASTATIN 10 mg tablet TAKE 1 TABLET BY MOUTH EVERY DAY   ? fluticasone (FLONASE ALLERGY RELIEF) 50 mcg/act nasal spray    ? ibuprofen 800 mg tablet Take 1 tablet (800 mg total) by mouth every six (6) hours as needed.     No facility-administered medications prior to visit.         Past Medical History:  Past Medical History:   Diagnosis Date   ? End of life care 01/01/2019    ADVANCE CARE DIRECTIVE REVIEWED/ FORMS GIVEN WILL REVIEW AT NEXT VISIT    ? Hyperlipidemia 11/28/2012   ? Insomnia      Patient Active Problem List   Diagnosis   ? Hyperlipidemia   ? Routine general medical examination at a health care facility   ? Preventative health care   ? SK (seborrheic keratosis)   ? End of life care         Past Surgical History:  Past Surgical History:   Procedure Laterality Date   ? COLONOSCOPY  01/07/2012    Procedure: COLONOSCOPY;  Surgeon: Joslyn Devon, MD;  Location: [facility];  Service: Gastroenterology;  Laterality: N/A;  scope C#11   ? UPPER GASTROINTESTINAL ENDOSCOPY  01/07/2012    Procedure: UPPER GI ENDOSCOPY;  Surgeon: Joslyn Devon, MD;  Location: [facility]; Service: Gastroenterology;  Laterality: N/A;  Scope E# 6       Social History:  Social History     Socioeconomic History   ? Marital status: Married     Spouse name: Keith Rake   ? Number of children: 1   ? Years of education: masters   Occupational History   ? Occupation: Scientist, physiological   Tobacco Use   ? Smoking status: Never   ? Smokeless tobacco: Never   Substance and Sexual Activity   ? Alcohol use: Yes     Alcohol/week: 4.8 - 6.0 oz     Types: 8 - 10 Glasses of Wine (5 oz) per week     Comment: mild   ? Drug use: No   ? Sexual activity: Yes     Partners: Male     Birth control/protection: None   Other Topics Concern   ? Do you exercise at least a day, 3 or more days a week? Yes   ? Types of Exercise? (List in Comments) Yes     Comment: gym   ? Do you  follow a special diet? No   Social History Narrative    Diet; Balanced     Social Determinants of Health     Financial Resource Strain: Low Risk  (09/01/2022)    Overall Financial Resource Strain (CARDIA)    ? Difficulty of Paying Living Expenses: Not hard at all   Physical Activity: Sufficiently Active (09/01/2022)    Exercise Vital Sign    ? Days of Exercise per Week: 5 days    ? Minutes of Exercise per Session: 40 min   Stress: No Stress Concern Present (09/01/2022)    [employer] of Occupational Health - Occupational Stress Questionnaire    ? Feeling of Stress : Only a little       Family History:   Family History   Problem Relation Age of Onset   ? Skin cancer Mother    ? Heart disease Mother         VALVE   ? Arthritis Sister    ? Diabetes Other         Family Hx   ? Mental illness Other         Family Hx   ? Asthma Other         Family Hx   ? Allergies Other         Family Hx       Health Maintenance:   Vaccines:   Immunization History   Administered Date(s) Administered   ? COVID-19 Gala Murdoch Fall 2023 12y and up) PF, 50 mcg/0.5 mL 08/24/2022   ? COVID-19, mRNA, (Moderna) 100 mcg/0.5 mL 01/10/2020, 02/07/2020, 09/04/2020, 01/15/2021   ? COVID-19, mRNA, bivalent (Moderna) 50 mcg/0.5 mL (12y and up) 07/02/2021   ? Influenza Whole 06/18/2018   ? RSV vaccine, bivalent, PF Verdis Frederickson) 08/04/2022   ? Td 10/18/2006, 08/18/2020   ? Tdap 10/15/2013   ? influenza vaccine IM quadrivalent (Fluarix Quad) (PF) SYR (59 months of age and older) 06/20/2019   ? influenza vaccine IM quadrivalent (Fluzone Quad) (PF) SYR/SDV (37 months of age and older) 08/13/2014, 07/03/2015   ? influenza vaccine IM quadrivalent (Fluzone Quad) MDV (65 months of age and older) 07/06/2016   ? influenza vaccine IM quadrivalent adjuvanted (FluAD Quad) (PF) SYR (79 years of age and older) 07/02/2021, 06/30/2022   ? influenza, unspecified formulation 07/07/2011, 07/19/2013, 06/18/2017, 07/13/2017, 06/18/2018, 07/24/2020   ? pneumococcal conjugate vaccine 13-valent (Prevnar) 10/31/2014, 12/15/2016   ? pneumococcal polysaccharide vaccine 23-valent (Pneumovax) 01/01/2019   ? zoster vac recomb adjuvanted (Shingrix) 01/01/2019, 06/20/2019     Tdap: +  Pneumovax: ++ pneumo23 #1  Zostervax: ++  Hepatitis: +  Colon Cancer Screen: +  Prostate Cancer Screen: -  Abdominal US: -  PAP: +  Mammogram: +  Bone Density: +  Eye Exam: +  Dental Exam: +  Flu Shot:+      Physical Exam:     Objective:     Vitals:    Last Recorded Vital Signs:    09/01/22 0938   BP: 117/78   Pulse: 77   Resp: 16   Temp: 36.4 ?C (97.5 ?F)   SpO2: 100%     Body mass index is 22.6 kg/m?Marland Kitchen  Vitals:    09/01/22 0938   Weight: 140 lb (63.5 kg)      BP 117/78  ~ Pulse 77  ~ Temp 36.4 ?C (97.5 ?F) (Tympanic)  ~ Resp 16  ~ Wt 140 lb (63.5 kg)  ~ SpO2 100%  ~ BMI 22.60 kg/m?  General appearance: alert, appears stated age and cooperative  Head: Normocephalic, without obvious abnormality, atraumatic  Eyes: conjunctivae/corneas clear. PERRL, EOM's intact. Fundi benign.  Ears: normal TM's and external ear canals both ears  Nose: Nares normal. Septum midline. Mucosa normal. No drainage or sinus tenderness.  Neck: no adenopathy, no carotid bruit, no JVD, supple, symmetrical, trachea midline and thyroid not enlarged, symmetric, no tenderness/mass/nodules  Back: symmetric, no curvature. ROM normal. No CVA tenderness.  Lungs: clear to auscultation bilaterally  Breasts: normal appearance, no masses or tenderness  Heart: regular rate and rhythm, S1, S2 normal, no murmur, click, rub or gallop  Abdomen: soft, non-tender; bowel sounds normal; no masses,  no organomegaly  Pelvic: deferred  Extremities: extremities normal, atraumatic, no cyanosis or edema  Pulses: 2+ and symmetric  Skin: Skin color, texture, turgor normal.  Multiple SEBORRHEIC KERATOSIS    Lymph nodes: cervical, supraclavicular, and axillary nodes normal.  Neurologic: Grossly normal          Lab/Studies:  DRAWN              Assessment & Plan:   Diagnoses and all orders for this visit:    Diagnoses and all orders for this visit:    Routine general medical examination at a health care facility  -     -   -     Lipid Panel; Future  -     TSH with reflex FT4, FT3; Future  -     Vitamin D,25-Hydroxy; Future  -     Comprehensive Metabolic Panel; Future  -     CBC & Auto Differential; Future  -     Hgb A1c; Future  -     Fecal Occult Blood Immunoassay; Future  -     POCT urinalysis dipstick  -     ECG 12 lead    SK (seborrheic keratosis)     Follow up  Derm     Mixed hyperlipidemia  -     Lipid Panel; Future  -     Comprehensive Metabolic Panel; Future    Primary insomnia   Continue all meds      End of life care   ADVANCE CARE DIRECTIVE REVIEWED/ FORMS GIVEN WILL REVIEW AT NEXT VISIT                Laboratory will be reviewed and communicated to patient.      The below was during visit.     EKG     I have independently reviewed and interpreted the above.   The records are uploaded or scanned into care connect in the patients personal file.      30 min was spent with @NAMEBYAGE @ including over 50% FTF for non preventative related care, counseling, and problem management.       The above plan of care, diagnosis, orders, and follow-up were discussed with the patient.  Questions related to this recommended plan of care were answered.      Author:  Simon Rhein. Lajean Silvius, MD 09/01/2022 10:10 AM

## 2022-09-02 LAB — Lipid Panel: CHOLESTEROL, HDL: 80 mg/dL (ref >50–<100)

## 2022-09-02 LAB — Differential Automated: ABSOLUTE IMMATURE GRAN COUNT: 0.01 10*3/uL (ref 0.00–0.04)

## 2022-09-02 LAB — Comprehensive Metabolic Panel: ALKALINE PHOSPHATASE: 70 U/L (ref 37–113)

## 2022-09-02 LAB — CBC: MCH CONCENTRATION: 30.6 g/dL — ABNORMAL LOW (ref 31.5–35.5)

## 2022-09-02 LAB — Hgb A1c: HGB A1C: 5.8 — ABNORMAL HIGH (ref <5.7)

## 2022-09-02 LAB — TSH with reflex FT4, FT3: TSH: 1.6 u[IU]/mL (ref 0.3–4.7)

## 2022-09-08 LAB — Fecal Immunochemical Test: FECAL IMMUNOCHEMICAL TEST: NEGATIVE

## 2022-09-13 ENCOUNTER — Other Ambulatory Visit (HOSPITAL_COMMUNITY): Payer: Self-pay | Admitting: *Deleted

## 2022-09-13 ENCOUNTER — Telehealth (HOSPITAL_COMMUNITY): Payer: Self-pay

## 2022-09-13 MED ORDER — SACUBITRIL-VALSARTAN 97-103 MG PO TABS: 1.0000 | ORAL_TABLET | Freq: Two times a day (BID) | ORAL | 3 refills | Status: AC

## 2022-09-13 NOTE — Telephone Encounter (Signed)
Advanced Heart Failure Patient Advocate Encounter  The patient was approved for a [place] that will help cover the cost of Entresto. Wilder Glade is already approved for the 2024 year, and will not need to use these grant funds.  Total amount awarded, $10,000.  Effective: 08/14/2022 - 08/14/2023.  BIN [id number] PCN [id number] [employer] [id number] ID [id number]  New prescription(s) sent to [facility]. Patient provided with approval and processing information via email.  Clista Bernhardt, CPhT Rx Patient Advocate Phone: [phone]

## 2022-09-20 ENCOUNTER — Telehealth: Payer: PRIVATE HEALTH INSURANCE

## 2022-09-20 ENCOUNTER — Ambulatory Visit: Payer: PRIVATE HEALTH INSURANCE

## 2022-09-20 NOTE — Telephone Encounter
Hello Dr. Lajean Silvius,     Patient called in and requested a call back in regards to her lab results. Patient stated she seen some of them were abnormal. Patient requested call back to discuss bone density and mammogram. I offered to schedule patient an appt in your same day slot for Wed 12/06. Patient declined and stated she prefers a Industrial/product designer through [employer].     Please review and advise.    Thank you,     Julious Langlois

## 2022-09-27 NOTE — Progress Notes (Incomplete)
Advanced Heart Failure Clinic Note  Date:  09/27/2022   ID:  AYSEL GILCHREST, DOB 29-Nov-1955, MRN [id number]  Location: Home  Provider location: [place] [facility] Failure Type of Visit: Established patient   PCP:  Monico Blitz, MD  Cardiologist:  Kate Sable, MD (Inactive) Primary HF: Dr Haroldine Laws  EP: Dr Curt Bears  Chief Complaint: F/u for Chronic Systolic Heart Failure, S/p COVID   History of Present Illness:  Jennifer [name] is a 66 y.o. female with a history of systolic HF due to mixed ICM/NICM, DVT, PE, fibromyalgia.   Long h/o reported mild NICM EF 40-45%. Echo 11/19 EF 15-20%  Catheterization done 09/21/18 revealed 90% proximal LAD and a 50% circumflex stenosis.  She underwent bypass grafting x2 with an LIMA to the LAD and an SVG to the OM on 09/22/18 with Dr Roxan Hockey.  Post CABG EF remained depressed.   -cMRI 2/20 LVEF 21% Moderately reduced RV, moderateTR. No LGE -ECHO 02/2019 EF 20-25% RV severerly reduced  - Echo 12/26/19 EF 20-25% RV normal.   Zio Patch 5/20 7.3 % PVCs 1. Predominant underlying rhythm was Sinus Rhythm. Minimum HR of 56 bpm, max HR of 176 bpm, and avg HR of 77 bpm. 2. Three runs of NSVT - longest 10 beats 3. Seven runs of SVT - longest lasting 9 beats   Started carvedilol 03/13/2019    Underwent SJ ICD 07/25/19   Here for routine f/u. Had GI bleed in in 8/22 in [place]. Got 2u RBCs. . EGD showed 1 oozing cratered duodenal ulcer with visible vessel found in the duodenal bulb and in the first part portion of the duodenum. Lesion was 9 mm in largest dimension. 3 hemostatic clips were successfully placed to stop the bleeding. Additionally 4 nonbleeding cratered gastric ulcers with no stigmata of bleeding were found in the gastric antrum. A small hiatal hernia was also appreciated. No further bleeding. Back on Xarelto. Says she gets does a lot of walking but will give out after 1/4 mile. No CP, orthopnea or PND. Still working at the barn.   ICD  interrogated in clinic. No VT. Occasional brief mode switches. Volume ok. Activity 4hr/day Personally reviewed    Cardiac studies:  CPX 07/09/19  FVC 2.09 (73%)      FEV1 1.47 (65%)        FEV1/FVC 70 (88%)        MVV 70 (81%)       Resting HR: 52 Standing HR:48 Peak HR: 102   (65% age predicted max HR)  BP rest: 84/60 Standing BP:90/60 BP peak: 124/60  Peak VO2: 16.0 (89% predicted peak VO2)  VE/VCO2 slope:  29  OUES: 1.81  Peak RER: 1.05  VE/MVV:  67%  Echo  02/19/2019 EF 20-25% RV severely reduced 11/28/2018 EF 10-15% with moderate RV dysfunction.  12/26/19 EF 20-25% RV normal.   cMRI 12/05/18 1. Moderately dilated LV with EF 21%, diffuse hypokinesis.  2. Normal RV size with moderately decreased systolic function, EF 08%. 3. Moderate TR, at least moderate central MR (likely functional). 4. No myocardial LGE, so no evidence for prior infarct, infiltrative disease, or myocarditis.  RHC 12/05/18 On milrinone 0.125 RA = 8 RV = 37/10 PA = 39/15 (25) PCW = 19 (v = 35) Fick cardiac output/index = 5.0/2.8 PVR = 1.1 WU Ao sat = 94% PA sat = 66%, 66% PaPi = 3.0 RA/PCW = 0.42  Past Medical History:  Diagnosis Date   Arthritis    "  probably; maybe in my hands" (09/21/2018)   Congestive heart failure (CHF) (HCC)    Dyspnea    Fibromyalgia    GERD (gastroesophageal reflux disease)    Hypercholesteremia    Hypertension    IBS (irritable bowel syndrome)    Mild sleep apnea    no CPAP (09/21/2018)   Myocardial infarction [facility])    "was told I have had one; never knew about it" (09/21/2018)   Nonischemic cardiomyopathy ([place])    ECHO EF 40-45%, Septal Thickness,and mild global left ventricular dysfunction; stress test 03/2004 +VE, refersible defect; ECHO 03/2005 '@[facility]'$  EF 55%   Pulmonary embolus [facility])    Past Surgical History:  Procedure Laterality Date   ANKLE FRACTURE SURGERY Left 2000   BIOPSY  09/17/2021   Procedure: BIOPSY;  Surgeon: Daneil Dolin, MD;  [facility];  Service: Endoscopy;;   BUNIONECTOMY Right 1990s?   "& removed part of my great toe due to fungus under nail"   CARDIAC CATHETERIZATION  03/2005   CATARACT EXTRACTION, BILATERAL Bilateral    COLONOSCOPY  07/2011   Dr. Britta Mccreedy at [id number] polyp found in the distal transverse colon, approximately 1 cm in size, removed.  Mild diverticulosis.  Internal hemorrhoids.  Path report available.   CORONARY ARTERY BYPASS GRAFT N/A 09/22/2018   Procedure: CORONARY ARTERY BYPASS GRAFTING (CABG), ON PUMP, TIMES TWO, USING LEFT INTERNAL MAMMARY ARTERY AND ENDOSCOPICALLY HARVESTED RIGHT GREATER SAPHENOUS VEIN;  Surgeon: Melrose Nakayama, MD;  Location: [place];  Service: Open Heart Surgery;  Laterality: N/A;   DILATION AND CURETTAGE OF UTERUS     ESOPHAGOGASTRODUODENOSCOPY (EGD) WITH PROPOFOL N/A 09/17/2021   Procedure: ESOPHAGOGASTRODUODENOSCOPY (EGD) WITH PROPOFOL;  Surgeon: Daneil Dolin, MD;  [facility];  Service: Endoscopy;  Laterality: N/A;  8:30am   FRACTURE SURGERY     ICD IMPLANT N/A 07/25/2019   Procedure: ICD IMPLANT;  Surgeon: Constance Haw, MD;  Location: [place] CV LAB;  Service: Cardiovascular;  Laterality: N/A;   RIGHT HEART CATH N/A 12/05/2018   Procedure: RIGHT HEART CATH;  Surgeon: Jolaine Artist, MD;  Location: [place] CV LAB;  Service: Cardiovascular;  Laterality: N/A;   RIGHT/LEFT HEART CATH AND CORONARY ANGIOGRAPHY N/A 09/21/2018   Procedure: RIGHT/LEFT HEART CATH AND CORONARY ANGIOGRAPHY;  Surgeon: Troy Sine, MD;  Location: [place] CV LAB;  Service: Cardiovascular;  Laterality: N/A;   TEE WITHOUT CARDIOVERSION N/A 09/22/2018   Procedure: TRANSESOPHAGEAL ECHOCARDIOGRAM (TEE);  Surgeon: Melrose Nakayama, MD;  Location: [place];  Service: Open Heart Surgery;  Laterality: N/A;   TUBAL LIGATION       Current Outpatient Medications  Medication Sig Dispense Refill   albuterol (VENTOLIN HFA) 108 (90 Base) MCG/ACT inhaler Inhale 1-2  puffs into the lungs every 6 (six) hours as needed for shortness of breath or wheezing.     apixaban (ELIQUIS) 2.5 MG TABS tablet Take 1 tablet (2.5 mg total) by mouth 2 (two) times daily. 60 tablet 11   Ascorbic Acid (VITAMIN C) 1000 MG tablet Take 1,000 mg by mouth daily.     atorvastatin (LIPITOR) 80 MG tablet Take 1 tablet (80 mg total) by mouth daily at 6 PM. (Patient taking differently: Take 80 mg by mouth in the morning.) 30 tablet 1   calamine lotion Apply 1 application topically as needed for itching.      carvedilol (COREG) 25 MG tablet TAKE 1 TABLET (25 MG TOTAL) BY MOUTH 2 (TWO) TIMES DAILY. KEEP UPCOMING  APPT. 60 tablet 0   cholecalciferol (VITAMIN D3) 25 MCG (1000 UT) tablet Take 1,000 Units by mouth daily.     dapagliflozin propanediol (FARXIGA) 10 MG TABS tablet Take 1 tablet (10 mg total) by mouth daily before breakfast. 90 tablet 3   DULoxetine (CYMBALTA) 60 MG capsule Take 60 mg by mouth 2 (two) times daily.     furosemide (LASIX) 40 MG tablet Take 40 mg by mouth daily as needed for edema.     gabapentin (NEURONTIN) 300 MG capsule Take 600 mg by mouth 3 (three) times daily.     pantoprazole (PROTONIX) 40 MG tablet TAKE 1 TABLET BY MOUTH TWICE  DAILY BEFORE A MEAL 200 tablet 2   Phenylephrine-APAP-guaiFENesin (MUCINEX SINUS-MAX PO) Take 2 tablets by mouth daily as needed (sinus headaches).     polyethylene glycol-electrolytes (NULYTELY) 420 g solution Take 4,000 mLs by mouth once.     polyvinyl alcohol (LIQUIFILM TEARS) 1.4 % ophthalmic solution Place 1 drop into both eyes as needed for dry eyes.     pramipexole (MIRAPEX) 0.5 MG tablet Take 0.5 mg by mouth at bedtime.      sacubitril-valsartan (ENTRESTO) 97-103 MG Take 1 tablet by mouth 2 (two) times daily. 180 tablet 3   spironolactone (ALDACTONE) 25 MG tablet TAKE 1 TABLET EVERY DAY 90 tablet 3   vitamin B-12 (CYANOCOBALAMIN) 500 MCG tablet Take 1 tablet (500 mcg total) by mouth daily. 30 tablet 0   No current  facility-administered medications for this visit.    Allergies:   Poison oak extract   Social History:  The patient  reports that she quit smoking about 31 years ago. Her smoking use included cigarettes. She has a 30.00 pack-year smoking history. She has never used smokeless tobacco. She reports that she does not currently use alcohol. She reports that she does not use drugs.   Family History:  The patient's family history includes Alcohol abuse in her father; Colon cancer in her father; Congestive Heart Failure (age of onset: 29) in her brother; Depression in her father; Diabetes Mellitus II in her mother; Heart disease (age of onset: 73) in her mother.   ROS:  Please see the history of present illness.   All other systems are personally reviewed and negative.  There were no vitals filed for this visit.   PHYSICAL EXAM: General:  Well appearing. No resp difficulty HEENT: normal Neck: supple. no JVD. Carotids 2+ bilat; no bruits. No lymphadenopathy or thryomegaly appreciated. Cor: PMI nondisplaced. Regular rate & rhythm. No rubs, gallops or murmurs. Lungs: clear Abdomen: obese soft, nontender, nondistended. No hepatosplenomegaly. No bruits or masses. Good bowel sounds. Extremities: no cyanosis, clubbing, rash, edema Neuro: alert & orientedx3, cranial nerves grossly intact. moves all 4 extremities w/o difficulty. Affect pleasant    Recent Labs: No results found for requested labs within last 365 days.  Personally reviewed   Wt Readings from Last 3 Encounters:  06/16/22 98 kg (216 lb)  03/16/22 105.5 kg (232 lb 9.6 oz)  11/02/21 104.3 kg (230 lb)     ASSESSMENT AND PLAN:  1. Biventricular Chronic Systolic HF  Suspect mixed ICM/NICM (?PVCs) - LHC 2019 90% LAD and 50% circumflex --> S/P CABG x2 LIMA to LAD and SVG to OM  - ECHO 11/2018 EF 10-15% moderate RV dysfunction.  - cMRI 11/2018  LVEF 21% Moderately reduced RV, moderateTR moderate to severe MR. No LGE - ECHO 02/2019 EF  20-25% RV severerly reduced - Echo 9/20 EF 20-25% severe MR -  Echo 3/20 EF 20-25%, RV normal, mild MR   - Echo  07/24/20 EF 25-30% RV normal mild MR.  - Zio Patch 5/20 with 7.3% PVCs.  - s/p ST Jude  ICD - CPX test 9/20 with preserved functional capacity but low HR. Off ivabradine  - Stable NYHA II-III - On full GDMT - Continue entresto 97/103 twice a day  - Continue spiro 25 daily  - Continue Farxiga '10mg'$  daily - Continue carvedilol 25 tid - ICD interrogated personally. Volume ok. No VT/AF.  - Labs today - Due for repeat echo  2. H/O bilateral PE 1/20 - Continue Xarelto. Previously on reduced dose,10 daily based on data from chronic prevention from [place] but now back on 20 - With recent GIB I would prefer to have her on apixaban 2.5 bid based on Amplify EXT trial   3. CAD S/P CABG 09/2018 - LIMA to the LAD and an SVG to the OM on 09/22/18 with Dr Roxan Hockey - No s/s angina - Continue statin and ? blocker - no ASA with DOAC use  4. PVCs - Zio Patch 5/20 with 7.3% PVCs.  - Improved. PVC burden 1% on device interrogation   5. Snoring - Home sleep study 7/20. AHI 7.2 with desat down to 85% - She decided against CPAP  6. Mitral Regurgitation  - likely due to LV dysfunction/dilation. - mild on recent echo  7. PUD  - with acute GI bleed in 8/22 - followed by GI - Restart Protonix - Keep off ASA - she is at acceptable cardiac risk to proceed with repeat endoscopy including EGD +/- colonoscopy. Ok to stop DOAC for 3 days prior.   [place], FNP  09/27/2022 3:37 PM  [place] [address] [facility] and [place] [address] [phone] (office) [phone] (fax)

## 2022-09-29 ENCOUNTER — Ambulatory Visit (HOSPITAL_COMMUNITY)
Admission: RE | Admit: 2022-09-29 | Discharge: 2022-09-29 | Disposition: A | Discharge status: Home / Self Care | Payer: Medicare Other | Source: Ambulatory Visit | Attending: Internal Medicine | Admitting: Internal Medicine

## 2022-09-29 ENCOUNTER — Ambulatory Visit (HOSPITAL_BASED_OUTPATIENT_CLINIC_OR_DEPARTMENT_OTHER)
Admission: RE | Admit: 2022-09-29 | Discharge: 2022-09-29 | Disposition: A | Discharge status: Home / Self Care | Payer: Medicare Other | Source: Ambulatory Visit | Attending: Family Medicine | Admitting: Family Medicine

## 2022-09-29 ENCOUNTER — Encounter (HOSPITAL_COMMUNITY): Payer: Self-pay

## 2022-09-29 VITALS — BP 120/80 | HR 82 | Wt 228.0 lb

## 2022-09-29 DIAGNOSIS — I2510 Atherosclerotic heart disease of native coronary artery without angina pectoris: Secondary | ICD-10-CM

## 2022-09-29 DIAGNOSIS — Z79899 Other long term (current) drug therapy: Secondary | ICD-10-CM | POA: Diagnosis not present

## 2022-09-29 DIAGNOSIS — R0602 Shortness of breath: Secondary | ICD-10-CM | POA: Diagnosis present

## 2022-09-29 DIAGNOSIS — K219 Gastro-esophageal reflux disease without esophagitis: Secondary | ICD-10-CM | POA: Diagnosis not present

## 2022-09-29 DIAGNOSIS — Z7984 Long term (current) use of oral hypoglycemic drugs: Secondary | ICD-10-CM | POA: Diagnosis not present

## 2022-09-29 DIAGNOSIS — Z86718 Personal history of other venous thrombosis and embolism: Secondary | ICD-10-CM | POA: Insufficient documentation

## 2022-09-29 DIAGNOSIS — G4733 Obstructive sleep apnea (adult) (pediatric): Secondary | ICD-10-CM

## 2022-09-29 DIAGNOSIS — I5022 Chronic systolic (congestive) heart failure: Secondary | ICD-10-CM

## 2022-09-29 DIAGNOSIS — I340 Nonrheumatic mitral (valve) insufficiency: Secondary | ICD-10-CM

## 2022-09-29 DIAGNOSIS — I493 Ventricular premature depolarization: Secondary | ICD-10-CM | POA: Diagnosis not present

## 2022-09-29 DIAGNOSIS — Z86711 Personal history of pulmonary embolism: Secondary | ICD-10-CM | POA: Diagnosis not present

## 2022-09-29 DIAGNOSIS — Z951 Presence of aortocoronary bypass graft: Secondary | ICD-10-CM | POA: Diagnosis not present

## 2022-09-29 DIAGNOSIS — I2699 Other pulmonary embolism without acute cor pulmonale: Secondary | ICD-10-CM

## 2022-09-29 DIAGNOSIS — I110 Hypertensive heart disease with heart failure: Secondary | ICD-10-CM | POA: Diagnosis not present

## 2022-09-29 DIAGNOSIS — K279 Peptic ulcer, site unspecified, unspecified as acute or chronic, without hemorrhage or perforation: Secondary | ICD-10-CM

## 2022-09-29 DIAGNOSIS — K449 Diaphragmatic hernia without obstruction or gangrene: Secondary | ICD-10-CM | POA: Insufficient documentation

## 2022-09-29 DIAGNOSIS — M797 Fibromyalgia: Secondary | ICD-10-CM | POA: Insufficient documentation

## 2022-09-29 DIAGNOSIS — Z7901 Long term (current) use of anticoagulants: Secondary | ICD-10-CM | POA: Insufficient documentation

## 2022-09-29 DIAGNOSIS — I429 Cardiomyopathy, unspecified: Secondary | ICD-10-CM | POA: Insufficient documentation

## 2022-09-29 DIAGNOSIS — I252 Old myocardial infarction: Secondary | ICD-10-CM | POA: Insufficient documentation

## 2022-09-29 LAB — CBC
HCT: 47.2 % — ABNORMAL HIGH (ref 36.0–46.0)
Hemoglobin: 15.7 g/dL — ABNORMAL HIGH (ref 12.0–15.0)
MCH: 32.6 pg (ref 26.0–34.0)
MCHC: 33.3 g/dL (ref 30.0–36.0)
MCV: 98.1 fL (ref 80.0–100.0)
Platelets: 173 10*3/uL (ref 150–400)
RBC: 4.81 MIL/uL (ref 3.87–5.11)
RDW: 12 % (ref 11.5–15.5)
WBC: 4.9 10*3/uL (ref 4.0–10.5)
nRBC: 0 % (ref 0.0–0.2)

## 2022-09-29 LAB — BASIC METABOLIC PANEL
Anion gap: 8 (ref 5–15)
BUN: 10 mg/dL (ref 8–23)
CO2: 28 mmol/L (ref 22–32)
Calcium: 8.9 mg/dL (ref 8.9–10.3)
Chloride: 102 mmol/L (ref 98–111)
Creatinine, Ser: 0.88 mg/dL (ref 0.44–1.00)
GFR, Estimated: >60 mL/min (ref >60)
Glucose, Bld: 100 mg/dL — ABNORMAL HIGH (ref 70–99)
Potassium: 4 mmol/L (ref 3.5–5.1)
Sodium: 138 mmol/L (ref 135–145)

## 2022-09-29 LAB — IRON AND TIBC
Iron: 88 ug/dL (ref 28–170)
Saturation Ratios: 25 % (ref 10.4–31.8)
TIBC: 357 ug/dL (ref 250–450)
UIBC: 269 ug/dL

## 2022-09-29 LAB — ECHOCARDIOGRAM COMPLETE
Area-P 1/2: 4.49 cm2
Calc EF: 24.9 %
S' Lateral: 5.4 cm
Single Plane A2C EF: 29 %
Single Plane A4C EF: 23 %

## 2022-09-29 LAB — BRAIN NATRIURETIC PEPTIDE: B Natriuretic Peptide: 57.8 pg/mL (ref 0.0–100.0)

## 2022-09-29 LAB — FERRITIN: Ferritin: 37 ng/mL (ref 11–307)

## 2022-09-29 LAB — TSH: TSH: 3.089 u[IU]/mL (ref 0.350–4.500)

## 2022-09-29 NOTE — Addendum Note (Signed)
Encounter addended by: Rafael Bihari, FNP on: 09/29/2022 1:11 PM  Actions taken: Clinical Note Signed

## 2022-09-29 NOTE — Progress Notes (Signed)
Echocardiogram 2D Echocardiogram has been performed.  Fidel Levy 09/29/2022, 10:53 AM

## 2022-09-29 NOTE — Patient Instructions (Signed)
Medication Changes:  No changes.  Lab Work:  Labs done today. We will only call if there is an abnormal result.  Testing/Procedures:  N/A  Referrals:  N/A  Special Instructions // Education:  N/A  Follow-Up in: with DB in 6 months. If we do not call you by April 2024, please call us to set up appointment.  At the [facility], you and your health needs are our priority. We have a designated team specialized in the treatment of Heart Failure. This Care Team includes your primary Heart Failure Specialized Cardiologist (physician), Advanced Practice Providers (APPs- Physician Assistants and Nurse Practitioners), and Pharmacist who all work together to provide you with the care you need, when you need it.   You may see any of the following providers on your designated Care Team at your next follow up:  Dr. Glori Bickers Dr. Loralie Champagne Dr. Roxana Hires, NP Lyda Jester, [place] [facility] [place], [place] Forestine Na, NP Audry Riles, PharmD   Please be sure to bring in all your medications bottles to every appointment.   Need to Contact us:  If you have any questions or concerns before your next appointment please send [place] a message through [place] or call our office at [phone].    TO LEAVE A MESSAGE FOR THE NURSE SELECT OPTION 2, PLEASE LEAVE A MESSAGE INCLUDING: YOUR NAME DATE OF BIRTH CALL BACK NUMBER REASON FOR CALL**this is important as we prioritize the call backs  YOU WILL RECEIVE A CALL BACK THE SAME DAY AS LONG AS YOU CALL BEFORE 4:00 PM

## 2022-10-01 ENCOUNTER — Ambulatory Visit (INDEPENDENT_AMBULATORY_CARE_PROVIDER_SITE_OTHER): Payer: Medicare Other

## 2022-10-01 DIAGNOSIS — I255 Ischemic cardiomyopathy: Secondary | ICD-10-CM

## 2022-10-04 ENCOUNTER — Encounter (HOSPITAL_COMMUNITY): Payer: Self-pay

## 2022-10-04 ENCOUNTER — Encounter (HOSPITAL_COMMUNITY)
Admission: RE | Admit: 2022-10-04 | Discharge: 2022-10-04 | Disposition: A | Discharge status: Home / Self Care | Payer: Medicare Other | Source: Ambulatory Visit | Attending: Internal Medicine | Admitting: Internal Medicine

## 2022-10-04 HISTORY — DX: Depression, unspecified: F32.A

## 2022-10-05 LAB — CUP PACEART REMOTE DEVICE CHECK
Battery Remaining Longevity: 69 mo
Battery Remaining Percentage: 70 %
Battery Voltage: 2.98 V
Brady Statistic AP VP Percent: 1 %
Brady Statistic AP VS Percent: 1 %
Brady Statistic AS VP Percent: 1 %
Brady Statistic AS VS Percent: 98 %
Brady Statistic RA Percent Paced: 1 %
Brady Statistic RV Percent Paced: 1 %
Date Time Interrogation Session: 20231215020014
HighPow Impedance: 92 Ohm
HighPow Impedance: 92 Ohm
Implantable Lead Connection Status: 753985
Implantable Lead Connection Status: 753985
Implantable Lead Implant Date: 20201007
Implantable Lead Implant Date: 20201007
Implantable Lead Location: 753859
Implantable Lead Location: 753860
Implantable Lead Model: 7122
Implantable Pulse Generator Implant Date: 20201007
Lead Channel Impedance Value: 460 Ohm
Lead Channel Impedance Value: 630 Ohm
Lead Channel Pacing Threshold Amplitude: 0.75 V
Lead Channel Pacing Threshold Amplitude: 0.75 V
Lead Channel Pacing Threshold Pulse Width: 0.5 ms
Lead Channel Pacing Threshold Pulse Width: 0.5 ms
Lead Channel Sensing Intrinsic Amplitude: 1.8 mV
Lead Channel Sensing Intrinsic Amplitude: 12 mV
Lead Channel Setting Pacing Amplitude: 2 V
Lead Channel Setting Pacing Amplitude: 2.5 V
Lead Channel Setting Pacing Pulse Width: 0.5 ms
Lead Channel Setting Sensing Sensitivity: 0.5 mV
Pulse Gen Serial Number: 9901215
Zone Setting Status: 755011

## 2022-10-06 ENCOUNTER — Encounter (HOSPITAL_COMMUNITY): Payer: Self-pay

## 2022-10-06 ENCOUNTER — Encounter (HOSPITAL_COMMUNITY)
Admission: RE | Disposition: A | Discharge status: Home / Self Care | Payer: Self-pay | Source: Home / Self Care | Attending: Internal Medicine

## 2022-10-06 ENCOUNTER — Ambulatory Visit (HOSPITAL_BASED_OUTPATIENT_CLINIC_OR_DEPARTMENT_OTHER): Payer: Medicare Other | Admitting: Anesthesiology

## 2022-10-06 ENCOUNTER — Ambulatory Visit (HOSPITAL_COMMUNITY): Payer: Medicare Other | Admitting: Anesthesiology

## 2022-10-06 ENCOUNTER — Ambulatory Visit (HOSPITAL_COMMUNITY)
Admission: RE | Admit: 2022-10-06 | Discharge: 2022-10-06 | Disposition: A | Discharge status: Home / Self Care | Payer: Medicare Other | Attending: Internal Medicine | Admitting: Internal Medicine

## 2022-10-06 DIAGNOSIS — K589 Irritable bowel syndrome without diarrhea: Secondary | ICD-10-CM | POA: Insufficient documentation

## 2022-10-06 DIAGNOSIS — I340 Nonrheumatic mitral (valve) insufficiency: Secondary | ICD-10-CM | POA: Insufficient documentation

## 2022-10-06 DIAGNOSIS — I2510 Atherosclerotic heart disease of native coronary artery without angina pectoris: Secondary | ICD-10-CM | POA: Diagnosis not present

## 2022-10-06 DIAGNOSIS — K648 Other hemorrhoids: Secondary | ICD-10-CM | POA: Insufficient documentation

## 2022-10-06 DIAGNOSIS — K649 Unspecified hemorrhoids: Secondary | ICD-10-CM

## 2022-10-06 DIAGNOSIS — Z79899 Other long term (current) drug therapy: Secondary | ICD-10-CM | POA: Insufficient documentation

## 2022-10-06 DIAGNOSIS — Z87891 Personal history of nicotine dependence: Secondary | ICD-10-CM | POA: Insufficient documentation

## 2022-10-06 DIAGNOSIS — G709 Myoneural disorder, unspecified: Secondary | ICD-10-CM | POA: Diagnosis not present

## 2022-10-06 DIAGNOSIS — E7800 Pure hypercholesterolemia, unspecified: Secondary | ICD-10-CM | POA: Diagnosis not present

## 2022-10-06 DIAGNOSIS — Z1211 Encounter for screening for malignant neoplasm of colon: Secondary | ICD-10-CM | POA: Insufficient documentation

## 2022-10-06 DIAGNOSIS — I700 Atherosclerosis of aorta: Secondary | ICD-10-CM | POA: Diagnosis not present

## 2022-10-06 DIAGNOSIS — Z8711 Personal history of peptic ulcer disease: Secondary | ICD-10-CM | POA: Diagnosis not present

## 2022-10-06 DIAGNOSIS — G4730 Sleep apnea, unspecified: Secondary | ICD-10-CM | POA: Diagnosis not present

## 2022-10-06 DIAGNOSIS — Z7901 Long term (current) use of anticoagulants: Secondary | ICD-10-CM | POA: Insufficient documentation

## 2022-10-06 DIAGNOSIS — I252 Old myocardial infarction: Secondary | ICD-10-CM | POA: Insufficient documentation

## 2022-10-06 DIAGNOSIS — K219 Gastro-esophageal reflux disease without esophagitis: Secondary | ICD-10-CM | POA: Insufficient documentation

## 2022-10-06 DIAGNOSIS — D120 Benign neoplasm of cecum: Secondary | ICD-10-CM | POA: Diagnosis not present

## 2022-10-06 DIAGNOSIS — F32A Depression, unspecified: Secondary | ICD-10-CM | POA: Diagnosis not present

## 2022-10-06 DIAGNOSIS — Z86010 Personal history of colonic polyps: Secondary | ICD-10-CM | POA: Diagnosis not present

## 2022-10-06 DIAGNOSIS — Z800 Family history of malignant neoplasm of digestive organs: Secondary | ICD-10-CM | POA: Insufficient documentation

## 2022-10-06 DIAGNOSIS — Z09 Encounter for follow-up examination after completed treatment for conditions other than malignant neoplasm: Secondary | ICD-10-CM

## 2022-10-06 DIAGNOSIS — K635 Polyp of colon: Secondary | ICD-10-CM

## 2022-10-06 DIAGNOSIS — Z951 Presence of aortocoronary bypass graft: Secondary | ICD-10-CM | POA: Diagnosis not present

## 2022-10-06 DIAGNOSIS — Z86711 Personal history of pulmonary embolism: Secondary | ICD-10-CM | POA: Insufficient documentation

## 2022-10-06 DIAGNOSIS — I509 Heart failure, unspecified: Secondary | ICD-10-CM | POA: Insufficient documentation

## 2022-10-06 DIAGNOSIS — M797 Fibromyalgia: Secondary | ICD-10-CM | POA: Diagnosis not present

## 2022-10-06 DIAGNOSIS — K5730 Diverticulosis of large intestine without perforation or abscess without bleeding: Secondary | ICD-10-CM | POA: Diagnosis not present

## 2022-10-06 DIAGNOSIS — I110 Hypertensive heart disease with heart failure: Secondary | ICD-10-CM | POA: Diagnosis not present

## 2022-10-06 DIAGNOSIS — Z7984 Long term (current) use of oral hypoglycemic drugs: Secondary | ICD-10-CM | POA: Insufficient documentation

## 2022-10-06 HISTORY — PX: POLYPECTOMY: SHX5525

## 2022-10-06 HISTORY — PX: COLONOSCOPY WITH PROPOFOL: SHX5780

## 2022-10-06 SURGERY — COLONOSCOPY WITH PROPOFOL
Anesthesia: General

## 2022-10-06 MED ORDER — PHENYLEPHRINE 80 MCG/ML (10ML) SYRINGE FOR IV PUSH (FOR BLOOD PRESSURE SUPPORT)
PREFILLED_SYRINGE | INTRAVENOUS | Status: DC | PRN
  Administered 2022-10-06: 160 ug via INTRAVENOUS
  Administered 2022-10-06: 80 ug via INTRAVENOUS
  Administered 2022-10-06: 160 ug via INTRAVENOUS

## 2022-10-06 MED ORDER — VASOPRESSIN 20 UNIT/ML IV SOLN
INTRAVENOUS | Status: DC | PRN
  Administered 2022-10-06: 2 [IU] via INTRAVENOUS

## 2022-10-06 MED ORDER — PROPOFOL 10 MG/ML IV BOLUS
INTRAVENOUS | Status: DC | PRN
  Administered 2022-10-06: 100 mg via INTRAVENOUS

## 2022-10-06 MED ORDER — PROPOFOL 500 MG/50ML IV EMUL
INTRAVENOUS | Status: DC | PRN
  Administered 2022-10-06: 150 ug/kg/min via INTRAVENOUS

## 2022-10-06 MED ORDER — LIDOCAINE HCL (CARDIAC) PF 100 MG/5ML IV SOSY
PREFILLED_SYRINGE | INTRAVENOUS | Status: DC | PRN
  Administered 2022-10-06: 50 mg via INTRATRACHEAL

## 2022-10-06 MED ORDER — LACTATED RINGERS IV SOLN
INTRAVENOUS | Status: DC
  Administered 2022-10-06: 1000 mL via INTRAVENOUS

## 2022-10-06 MED ORDER — EPHEDRINE SULFATE-NACL 50-0.9 MG/10ML-% IV SOSY
PREFILLED_SYRINGE | INTRAVENOUS | Status: DC | PRN
  Administered 2022-10-06 (×2): 10 mg via INTRAVENOUS
  Administered 2022-10-06: 30 mg via INTRAVENOUS

## 2022-10-06 NOTE — H&P (Signed)
Primary Care Physician:  Monico Blitz, MD Primary Gastroenterologist:  Dr. Abbey Chatters  Pre-Procedure History & Physical: HPI:  Jennifer [name] is a 66 y.o. female is here for a colonoscopy to be performed for surveillance purposes, personal history of adenomatous colon polyps in 2012  Past Medical History:  Diagnosis Date   Arthritis    "probably; maybe in my hands" (09/21/2018)   Congestive heart failure (CHF) (HCC)    Depression    Dyspnea    Fibromyalgia    GERD (gastroesophageal reflux disease)    Hypercholesteremia    Hypertension    IBS (irritable bowel syndrome)    Mild sleep apnea    no CPAP (09/21/2018)   Myocardial infarction [facility])    "was told I have had one; never knew about it" (09/21/2018)   Nonischemic cardiomyopathy ([place])    ECHO EF 40-45%, Septal Thickness,and mild global left ventricular dysfunction; stress test 03/2004 +VE, refersible defect; ECHO 03/2005 '@[facility]'$  EF 55%   Pulmonary embolus [facility])     Past Surgical History:  Procedure Laterality Date   ANKLE FRACTURE SURGERY Left 2000   BIOPSY  09/17/2021   Procedure: BIOPSY;  Surgeon: Daneil Dolin, MD;  [facility];  Service: Endoscopy;;   BUNIONECTOMY Right 1990s?   "& removed part of my great toe due to fungus under nail"   CARDIAC CATHETERIZATION  03/2005   CATARACT EXTRACTION, BILATERAL Bilateral    COLONOSCOPY  07/2011   Dr. Britta Mccreedy at [id number] polyp found in the distal transverse colon, approximately 1 cm in size, removed.  Mild diverticulosis.  Internal hemorrhoids.  Path report available.   CORONARY ARTERY BYPASS GRAFT N/A 09/22/2018   Procedure: CORONARY ARTERY BYPASS GRAFTING (CABG), ON PUMP, TIMES TWO, USING LEFT INTERNAL MAMMARY ARTERY AND ENDOSCOPICALLY HARVESTED RIGHT GREATER SAPHENOUS VEIN;  Surgeon: Melrose Nakayama, MD;  Location: [place];  Service: Open Heart Surgery;  Laterality: N/A;   DILATION AND CURETTAGE OF UTERUS     ESOPHAGOGASTRODUODENOSCOPY (EGD) WITH PROPOFOL N/A  09/17/2021   Procedure: ESOPHAGOGASTRODUODENOSCOPY (EGD) WITH PROPOFOL;  Surgeon: Daneil Dolin, MD;  [facility];  Service: Endoscopy;  Laterality: N/A;  8:30am   FRACTURE SURGERY     ICD IMPLANT N/A 07/25/2019   Procedure: ICD IMPLANT;  Surgeon: Constance Haw, MD;  Location: [place] CV LAB;  Service: Cardiovascular;  Laterality: N/A;   RIGHT HEART CATH N/A 12/05/2018   Procedure: RIGHT HEART CATH;  Surgeon: Jolaine Artist, MD;  Location: [place] CV LAB;  Service: Cardiovascular;  Laterality: N/A;   RIGHT/LEFT HEART CATH AND CORONARY ANGIOGRAPHY N/A 09/21/2018   Procedure: RIGHT/LEFT HEART CATH AND CORONARY ANGIOGRAPHY;  Surgeon: Troy Sine, MD;  Location: [place] CV LAB;  Service: Cardiovascular;  Laterality: N/A;   TEE WITHOUT CARDIOVERSION N/A 09/22/2018   Procedure: TRANSESOPHAGEAL ECHOCARDIOGRAM (TEE);  Surgeon: Melrose Nakayama, MD;  Location: [place];  Service: Open Heart Surgery;  Laterality: N/A;   TUBAL LIGATION      Prior to Admission medications   Medication Sig Start Date End Date Taking? Authorizing Provider  albuterol (VENTOLIN HFA) 108 (90 Base) MCG/ACT inhaler Inhale 1-2 puffs into the lungs every 6 (six) hours as needed for shortness of breath or wheezing. 04/07/22  Yes [provider]  apixaban (ELIQUIS) 2.5 MG TABS tablet Take 1 tablet (2.5 mg total) by mouth 2 (two) times daily. 07/29/21  Yes Bensimhon, Shaune Pascal, MD  Ascorbic Acid (VITAMIN C) 1000 MG tablet Take 1,000 mg  by mouth daily.   Yes [provider]  atorvastatin (LIPITOR) 80 MG tablet Take 1 tablet (80 mg total) by mouth daily at 6 PM. Patient taking differently: Take 80 mg by mouth in the morning. 09/28/18  Yes Conte, Tessa N, PA-C  carvedilol (COREG) 25 MG tablet TAKE 1 TABLET (25 MG TOTAL) BY MOUTH 2 (TWO) TIMES DAILY. KEEP UPCOMING APPT. 10/15/21  Yes Camnitz, Will Hassell Done, MD  cholecalciferol (VITAMIN D3) 25 MCG (1000 UT) tablet Take 1,000 Units by  mouth daily.   Yes [provider]  dapagliflozin propanediol (FARXIGA) 10 MG TABS tablet Take 1 tablet (10 mg total) by mouth daily before breakfast. 05/06/22  Yes Bensimhon, Shaune Pascal, MD  DULoxetine (CYMBALTA) 60 MG capsule Take 60 mg by mouth 2 (two) times daily.   Yes [provider]  furosemide (LASIX) 40 MG tablet Take 40 mg by mouth daily as needed for edema.   Yes [provider]  gabapentin (NEURONTIN) 300 MG capsule Take 600 mg by mouth 3 (three) times daily.   Yes [provider]  pantoprazole (PROTONIX) 40 MG tablet TAKE 1 TABLET BY MOUTH TWICE  DAILY BEFORE A MEAL 08/31/22  Yes Mahala Menghini, PA-C  Phenylephrine-APAP-guaiFENesin (MUCINEX SINUS-MAX PO) Take 2 tablets by mouth daily as needed (sinus headaches).   Yes [provider]  polyethylene glycol-electrolytes (NULYTELY) 420 g solution Take 4,000 mLs by mouth once. 06/29/22  Yes [provider]  polyvinyl alcohol (LIQUIFILM TEARS) 1.4 % ophthalmic solution Place 1 drop into both eyes as needed for dry eyes.   Yes [provider]  pramipexole (MIRAPEX) 0.5 MG tablet Take 0.5 mg by mouth at bedtime.    Yes [provider]  sacubitril-valsartan (ENTRESTO) 97-103 MG Take 1 tablet by mouth 2 (two) times daily. 09/13/22  Yes Bensimhon, Shaune Pascal, MD  spironolactone (ALDACTONE) 25 MG tablet TAKE 1 TABLET EVERY DAY 08/27/21  Yes Bensimhon, Shaune Pascal, MD  vitamin B-12 (CYANOCOBALAMIN) 500 MCG tablet Take 1 tablet (500 mcg total) by mouth daily. 12/07/18  Yes Florencia Reasons, MD  calamine lotion Apply 1 application topically as needed for itching.     [provider]    Allergies as of 06/29/2022 - Review Complete 06/16/2022  Allergen Reaction Noted   Poison oak extract Rash 08/24/2018    Family History  Problem Relation Age of Onset   Diabetes Mellitus II Mother    Heart disease Mother 70   Colon cancer Father        52   Alcohol abuse Father    Depression  Father    Congestive Heart Failure Brother 54       probably dilated cardiomyopathy after PNA    Social History   Socioeconomic History   Marital status: Widowed    Spouse name: Not on file   Number of children: Not on file   Years of education: Not on file   Highest education level: Not on file  Occupational History   Not on file  Tobacco Use   Smoking status: Former    Packs/day: 1.00    Years: 30.00    Total pack years: 30.00    Types: Cigarettes    Quit date: 22    Years since quitting: 31.9   Smokeless tobacco: Never  Vaping Use   Vaping Use: Never used  Substance and Sexual Activity   Alcohol use: Not Currently   Drug use: Never   Sexual activity: Not Currently  Other Topics Concern  Not on file  Social History Narrative   Not on file   Social Determinants of Health   Financial Resource Strain: Not on file  Food Insecurity: Not on file  Transportation Needs: Not on file  Physical Activity: Not on file  Stress: Not on file  Social Connections: Not on file  Intimate Partner Violence: Not on file    Review of Systems: See HPI, otherwise negative ROS  Physical Exam: Vital signs in last 24 hours: Temp:  [98.5 F (36.9 C)] 98.5 F (36.9 C) (12/20 0930) Pulse Rate:  [60] 60 (12/20 0930) Resp:  [16] 16 (12/20 0930) BP: (100)/(40) 100/40 (12/20 0930) SpO2:  [96 %] 96 % (12/20 0930) Weight:  [103.4 kg] 103.4 kg (12/20 0930)   General:   Alert,  Well-developed, well-nourished, pleasant and cooperative in NAD Head:  Normocephalic and atraumatic. Eyes:  Sclera clear, no icterus.   Conjunctiva pink. Ears:  Normal auditory acuity. Nose:  No deformity, discharge,  or lesions. Msk:  Symmetrical without gross deformities. Normal posture. Extremities:  Without clubbing or edema. Neurologic:  Alert and  oriented x4;  grossly normal neurologically. Skin:  Intact without significant lesions or rashes. Psych:  Alert and cooperative. Normal mood and  affect.  Impression/Plan: Jennifer [name] is here for a colonoscopy to be performed for surveillance purposes, personal history of adenomatous colon polyps in 2012  The risks of the procedure including infection, bleed, or perforation as well as benefits, limitations, alternatives and imponderables have been reviewed with the patient. Questions have been answered. All parties agreeable.

## 2022-10-06 NOTE — Discharge Instructions (Signed)
  Colonoscopy Discharge Instructions  Read the instructions outlined below and refer to this sheet in the next few weeks. These discharge instructions provide you with general information on caring for yourself after you leave the hospital. Your doctor may also give you specific instructions. While your treatment has been planned according to the most current medical practices available, unavoidable complications occasionally occur.   ACTIVITY You may resume your regular activity, but move at a slower pace for the next 24 hours.  Take frequent rest periods for the next 24 hours.  Walking will help get rid of the air and reduce the bloated feeling in your belly (abdomen).  No driving for 24 hours (because of the medicine (anesthesia) used during the test).   Do not sign any important legal documents or operate any machinery for 24 hours (because of the anesthesia used during the test).  NUTRITION Drink plenty of fluids.  You may resume your normal diet as instructed by your doctor.  Begin with a light meal and progress to your normal diet. Heavy or fried foods are harder to digest and may make you feel sick to your stomach (nauseated).  Avoid alcoholic beverages for 24 hours or as instructed.  MEDICATIONS You may resume your normal medications unless your doctor tells you otherwise.  WHAT YOU CAN EXPECT TODAY Some feelings of bloating in the abdomen.  Passage of more gas than usual.  Spotting of blood in your stool or on the toilet paper.  IF YOU HAD POLYPS REMOVED DURING THE COLONOSCOPY: No aspirin products for 7 days or as instructed.  No alcohol for 7 days or as instructed.  Eat a soft diet for the next 24 hours.  FINDING OUT THE RESULTS OF YOUR TEST Not all test results are available during your visit. If your test results are not back during the visit, make an appointment with your caregiver to find out the results. Do not assume everything is normal if you have not heard from your  caregiver or the medical facility. It is important for you to follow up on all of your test results.  SEEK IMMEDIATE MEDICAL ATTENTION IF: You have more than a spotting of blood in your stool.  Your belly is swollen (abdominal distention).  You are nauseated or vomiting.  You have a temperature over 101.  You have abdominal pain or discomfort that is severe or gets worse throughout the day.   Your colonoscopy revealed 1 polyp(s) which I removed successfully. Await pathology results, my office will contact you. I recommend repeating colonoscopy in 5 years for surveillance purposes.   You also have diverticulosis and internal hemorrhoids. I would recommend increasing fiber in your diet or adding OTC Benefiber/Metamucil. Be sure to drink at least 4 to 6 glasses of water daily. Follow-up with GI as needed.   I hope you have a great rest of your week!  Elon Alas. Abbey Chatters, D.O. Gastroenterology and Hepatology [facility]

## 2022-10-06 NOTE — Op Note (Signed)
[facility] Patient Name: Jennifer [name] Procedure Date: 10/06/2022 10:29 AM MRN: [id number] Date of Birth: 1956-05-18 Attending MD: Elon Alas. Abbey Chatters , [place], [id number] CSN: [id number] Age: 66 Admit Type: Outpatient Procedure:                Colonoscopy Indications:              Surveillance: Personal history of adenomatous                            polyps on last colonoscopy > 5 years ago Providers:                Elon Alas. Abbey Chatters, DO, Tammy Vaught, RN, Everardo Pacific Referring MD:              Medicines:                See the Anesthesia note for documentation of the                            administered medications Complications:            No immediate complications. Estimated Blood Loss:     Estimated blood loss was minimal. Procedure:                Pre-Anesthesia Assessment:                           - The anesthesia plan was to use monitored                            anesthesia care (MAC).                           After obtaining informed consent, the colonoscope                            was passed under direct vision. Throughout the                            procedure, the patient's blood pressure, pulse, and                            oxygen saturations were monitored continuously. The                            [id number]-HQ190L ([id number]) scope was introduced through                            the anus and advanced to the the cecum, identified                            by appendiceal orifice and ileocecal valve. The                            colonoscopy was performed without  difficulty. The                            patient tolerated the procedure well. The quality                            of the bowel preparation was evaluated using the                            BBPS [facility] Bowel Preparation Scale) with scores                            of: Right Colon = 3, Transverse Colon = 3 and Left                            Colon = 3 (entire  mucosa seen well with no residual                            staining, small fragments of stool or opaque                            liquid). The total BBPS score equals 9. Scope In: 10:40:34 AM Scope Out: 10:50:18 AM Scope Withdrawal Time: 0 hours 7 minutes 30 seconds  Total Procedure Duration: 0 hours 9 minutes 44 seconds  Findings:      Hemorrhoids were found on perianal exam.      Multiple small-mouthed diverticula were found in the sigmoid colon.      A 5 mm polyp was found in the cecum. The polyp was sessile. The polyp       was removed with a cold snare. Resection and retrieval were complete.      The exam was otherwise without abnormality. Impression:               - Hemorrhoids found on perianal exam.                           - Diverticulosis in the sigmoid colon.                           - One 5 mm polyp in the cecum, removed with a cold                            snare. Resected and retrieved.                           - The examination was otherwise normal. Moderate Sedation:      Per Anesthesia Care Recommendation:           - Patient has a contact number available for                            emergencies. The signs and symptoms of potential                            delayed complications were discussed with  the                            patient. Return to normal activities tomorrow.                            Written discharge instructions were provided to the                            patient.                           - Resume previous diet.                           - Continue present medications.                           - Await pathology results.                           - Repeat colonoscopy in 5 years for surveillance.                           - Return to GI clinic PRN. Procedure Code(s):        --- Professional ---                           [id number], Colonoscopy, flexible; with removal of                            tumor(s), polyp(s), or other lesion(s) by  snare                            technique Diagnosis Code(s):        --- Professional ---                           Z86.010, Personal history of colonic polyps                           K64.9, Unspecified hemorrhoids                           D12.0, Benign neoplasm of cecum                           K57.30, Diverticulosis of large intestine without                            perforation or abscess without bleeding CPT copyright 2022 American Medical Association. All rights reserved. The codes documented in this report are preliminary and upon coder review may  be revised to meet current compliance requirements. Elon Alas. Abbey Chatters, DO [place], DO 10/06/2022 10:55:00 AM This report has been signed electronically. Number of Addenda: 0

## 2022-10-06 NOTE — Anesthesia Preprocedure Evaluation (Signed)
Anesthesia Evaluation  Patient identified by MRN, date of birth, ID band Patient awake    Reviewed: Allergy & Precautions, H&P , NPO status , Patient's Chart, lab work & pertinent test results, reviewed documented beta blocker date and time   Airway Mallampati: II  TM Distance: >3 FB Neck ROM: full    Dental no notable dental hx.    Pulmonary sleep apnea , former smoker   Pulmonary exam normal breath sounds clear to auscultation       Cardiovascular Exercise Tolerance: Good hypertension, + CAD, + Past MI, + CABG and +CHF   Rhythm:regular Rate:Normal     Neuro/Psych  PSYCHIATRIC DISORDERS  Depression     Neuromuscular disease    GI/Hepatic Neg liver ROS, PUD,GERD  Medicated,,  Endo/Other  negative endocrine ROS    Renal/GU   negative genitourinary   Musculoskeletal   Abdominal   Peds  Hematology negative hematology ROS (+)   Anesthesia Other Findings 1. Left ventricular ejection fraction, by estimation, is 25 to 30%. The  left ventricle has severely decreased function. The left ventricular  internal cavity size was moderately dilated. Left ventricular diastolic  parameters are consistent with Grade II  diastolic dysfunction (pseudonormalization).  2. Right ventricular systolic function is mildly reduced. The right  ventricular size is mildly enlarged. There is normal pulmonary artery  systolic pressure.  3. Left atrial size was moderately dilated.  4. The mitral valve is normal in structure. Moderate mitral valve  regurgitation.  5. The aortic valve is tricuspid. There is mild calcification of the  aortic valve. There is mild thickening of the aortic valve. Aortic valve  regurgitation is trivial.  6. The inferior vena cava is normal in size with greater than 50%  respiratory variability, suggesting right atrial pressure of 3 mmHg.   Reproductive/Obstetrics negative OB ROS                              Anesthesia Physical Anesthesia Plan  ASA: 3  Anesthesia Plan: General   Post-op Pain Management:    Induction:   PONV Risk Score and Plan: Propofol infusion and TIVA  Airway Management Planned:   Additional Equipment:   Intra-op Plan:   Post-operative Plan:   Informed Consent: I have reviewed the patients History and Physical, chart, labs and discussed the procedure including the risks, benefits and alternatives for the proposed anesthesia with the patient or authorized representative who has indicated his/her understanding and acceptance.     Dental Advisory Given  Plan Discussed with: CRNA  Anesthesia Plan Comments:         Anesthesia Quick Evaluation

## 2022-10-06 NOTE — Transfer of Care (Signed)
Immediate Anesthesia Transfer of Care Note  Patient: Jennifer [name]  Procedure(s) Performed: COLONOSCOPY WITH PROPOFOL POLYPECTOMY  Patient Location: PACU  Anesthesia Type:General  Level of Consciousness: awake, alert , oriented, and patient cooperative  Airway & Oxygen Therapy: Patient Spontanous Breathing  Post-op Assessment: Report given to RN, Post -op Vital signs reviewed and stable, and Patient moving all extremities  Post vital signs: Reviewed and stable  Last Vitals:  Vitals Value Taken Time  BP 91/57 10/06/22 1054  Temp 36.8 C 10/06/22 1054  Pulse 65 10/06/22 1054  Resp 18 10/06/22 1054  SpO2 96 % 10/06/22 1054    Last Pain:  Vitals:   10/06/22 1054  TempSrc: Oral  PainSc: 0-No pain      Patients Stated Pain Goal: 8 (49/20/10 0712)  Complications: No notable events documented.

## 2022-10-06 NOTE — Progress Notes (Signed)
Patient reported that she had taken her Eliquis and [place] yesterday. She also ate breakfast yesterday at 0800. Stated she completed her split prep this am at 0530, with tan liquid without any evidence of formed stool.  Dr. Abbey Chatters is aware and has spoken to the patient.

## 2022-10-07 LAB — SURGICAL PATHOLOGY

## 2022-10-08 NOTE — Anesthesia Postprocedure Evaluation (Signed)
Anesthesia Post Note  Patient: Jennifer [name]  Procedure(s) Performed: COLONOSCOPY WITH PROPOFOL POLYPECTOMY  Patient location during evaluation: Phase II Anesthesia Type: General Level of consciousness: awake Pain management: pain level controlled Vital Signs Assessment: post-procedure vital signs reviewed and stable Respiratory status: spontaneous breathing and respiratory function stable Cardiovascular status: blood pressure returned to baseline and stable Postop Assessment: no headache and no apparent nausea or vomiting Anesthetic complications: no Comments: Late entry   No notable events documented.   Last Vitals:  Vitals:   10/06/22 0930 10/06/22 1054  BP: (!) 100/40 (!) 91/57  Pulse: 60 65  Resp: 16 18  Temp: 36.9 C 36.8 C  SpO2: 96% 96%    Last Pain:  Vitals:   10/06/22 1054  TempSrc: Oral  PainSc: 0-No pain                 [name]

## 2022-10-12 MED ORDER — ATORVASTATIN CALCIUM 10 MG PO TABS
ORAL_TABLET | 3 refills
Start: 2022-10-12 — End: ?

## 2022-10-14 ENCOUNTER — Encounter (HOSPITAL_COMMUNITY): Payer: Self-pay | Admitting: Internal Medicine

## 2022-10-14 MED ORDER — ATORVASTATIN CALCIUM 10 MG PO TABS
ORAL_TABLET | 3 refills | Status: AC
Start: 2022-10-14 — End: ?

## 2022-10-21 NOTE — Progress Notes (Signed)
Remote ICD transmission.   

## 2022-11-09 ENCOUNTER — Ambulatory Visit: Payer: PRIVATE HEALTH INSURANCE

## 2022-11-26 ENCOUNTER — Encounter: Payer: Self-pay | Admitting: Internal Medicine

## 2022-12-20 ENCOUNTER — Inpatient Hospital Stay: Payer: PRIVATE HEALTH INSURANCE

## 2022-12-20 DIAGNOSIS — Z0000 Encounter for general adult medical examination without abnormal findings: Secondary | ICD-10-CM

## 2022-12-31 ENCOUNTER — Ambulatory Visit: Payer: Medicare Other

## 2022-12-31 DIAGNOSIS — I255 Ischemic cardiomyopathy: Secondary | ICD-10-CM

## 2022-12-31 LAB — CUP PACEART REMOTE DEVICE CHECK
Battery Remaining Longevity: 67 mo
Battery Remaining Percentage: 69 %
Battery Voltage: 2.98 V
Brady Statistic AP VP Percent: 1 %
Brady Statistic AP VS Percent: 1 %
Brady Statistic AS VP Percent: 1 %
Brady Statistic AS VS Percent: 98 %
Brady Statistic RA Percent Paced: 1 %
Brady Statistic RV Percent Paced: 1 %
Date Time Interrogation Session: 20240315020016
HighPow Impedance: 92 Ohm
HighPow Impedance: 92 Ohm
Implantable Lead Connection Status: 753985
Implantable Lead Connection Status: 753985
Implantable Lead Implant Date: 20201007
Implantable Lead Implant Date: 20201007
Implantable Lead Location: 753859
Implantable Lead Location: 753860
Implantable Lead Model: 7122
Implantable Pulse Generator Implant Date: 20201007
Lead Channel Impedance Value: 450 Ohm
Lead Channel Impedance Value: 660 Ohm
Lead Channel Pacing Threshold Amplitude: 0.75 V
Lead Channel Pacing Threshold Amplitude: 0.75 V
Lead Channel Pacing Threshold Pulse Width: 0.5 ms
Lead Channel Pacing Threshold Pulse Width: 0.5 ms
Lead Channel Sensing Intrinsic Amplitude: 12 mV
Lead Channel Sensing Intrinsic Amplitude: 3 mV
Lead Channel Setting Pacing Amplitude: 2 V
Lead Channel Setting Pacing Amplitude: 2.5 V
Lead Channel Setting Pacing Pulse Width: 0.5 ms
Lead Channel Setting Sensing Sensitivity: 0.5 mV
Pulse Gen Serial Number: 9901215
Zone Setting Status: 755011

## 2023-01-31 NOTE — Progress Notes (Signed)
Remote ICD transmission.   

## 2023-04-01 ENCOUNTER — Ambulatory Visit (INDEPENDENT_AMBULATORY_CARE_PROVIDER_SITE_OTHER): Payer: Medicare Other

## 2023-04-01 DIAGNOSIS — I255 Ischemic cardiomyopathy: Secondary | ICD-10-CM | POA: Diagnosis not present

## 2023-04-01 LAB — CUP PACEART REMOTE DEVICE CHECK
Battery Remaining Longevity: 65 mo
Battery Remaining Percentage: 66 %
Battery Voltage: 2.98 V
Brady Statistic AP VP Percent: 1 %
Brady Statistic AP VS Percent: 1.1 %
Brady Statistic AS VP Percent: 1 %
Brady Statistic AS VS Percent: 97 %
Brady Statistic RA Percent Paced: 1 %
Brady Statistic RV Percent Paced: 1 %
Date Time Interrogation Session: 20240614022959
HighPow Impedance: 92 Ohm
HighPow Impedance: 92 Ohm
Implantable Lead Connection Status: 753985
Implantable Lead Connection Status: 753985
Implantable Lead Implant Date: 20201007
Implantable Lead Implant Date: 20201007
Implantable Lead Location: 753859
Implantable Lead Location: 753860
Implantable Lead Model: 7122
Implantable Pulse Generator Implant Date: 20201007
Lead Channel Impedance Value: 440 Ohm
Lead Channel Impedance Value: 650 Ohm
Lead Channel Pacing Threshold Amplitude: 0.75 V
Lead Channel Pacing Threshold Amplitude: 0.75 V
Lead Channel Pacing Threshold Pulse Width: 0.5 ms
Lead Channel Pacing Threshold Pulse Width: 0.5 ms
Lead Channel Sensing Intrinsic Amplitude: 12 mV
Lead Channel Sensing Intrinsic Amplitude: 2.4 mV
Lead Channel Setting Pacing Amplitude: 2 V
Lead Channel Setting Pacing Amplitude: 2.5 V
Lead Channel Setting Pacing Pulse Width: 0.5 ms
Lead Channel Setting Sensing Sensitivity: 0.5 mV
Pulse Gen Serial Number: 9901215
Zone Setting Status: 755011

## 2023-04-15 ENCOUNTER — Telehealth: Payer: Self-pay

## 2023-04-15 NOTE — Telephone Encounter (Addendum)
Referred to ICM clinic by [facility] due to abnormal fluid levels on remote transmission.   Attempted call to patient for ICM intro and left message with ICM number to return call.   Also a patient of Dr Gala Romney, Advanced HF clinic.   04/13/2026 Covue thoracic impedance suggesting possible fluid accumulation starting 6/20.  ICM automatic follow up remote transmission scheduled for 7/8 (or will ask patient to send manual one before that if patient reached).     Will Jorja Loa, MD 04/04/2023  6:15 PM EDT Back to Top    Abnormal device interrogation reviewed.  Lead parameters and battery status stable.  Elevated fluid levels. Encourage low salt diet and enrol in [facility] clinic.

## 2023-04-19 NOTE — Progress Notes (Signed)
Remote ICD transmission.   

## 2023-05-17 NOTE — Telephone Encounter (Signed)
Attempted ICM referral call and left message for return call.   05/16/2023 [facility] Impedance report suggesting fluid levels fluctuate from possible fluid accumulation to dryness. Possible fluid accumulation days from 6/2-6/12, 6/20-6/29, 7/8-7/7.  Possible dryness 6/30-7/6, 7/15-7/18, 7/20-7/25.

## 2023-05-22 ENCOUNTER — Other Ambulatory Visit: Payer: Self-pay | Admitting: Gastroenterology

## 2023-05-26 NOTE — Telephone Encounter (Signed)
Attempted ICM referral call and unable to reach for ICM enrollment.

## 2023-07-01 ENCOUNTER — Ambulatory Visit (INDEPENDENT_AMBULATORY_CARE_PROVIDER_SITE_OTHER): Payer: Medicare Other

## 2023-07-01 DIAGNOSIS — I255 Ischemic cardiomyopathy: Secondary | ICD-10-CM

## 2023-07-01 LAB — CUP PACEART REMOTE DEVICE CHECK
Battery Remaining Longevity: 64 mo
Battery Remaining Percentage: 64 %
Battery Voltage: 2.98 V
Brady Statistic AP VP Percent: 1 %
Brady Statistic AP VS Percent: 1.3 %
Brady Statistic AS VP Percent: 1 %
Brady Statistic AS VS Percent: 97 %
Brady Statistic RA Percent Paced: 1 %
Brady Statistic RV Percent Paced: 1 %
Date Time Interrogation Session: 20240913020015
HighPow Impedance: 93 Ohm
HighPow Impedance: 93 Ohm
Implantable Lead Connection Status: 753985
Implantable Lead Connection Status: 753985
Implantable Lead Implant Date: 20201007
Implantable Lead Implant Date: 20201007
Implantable Lead Location: 753859
Implantable Lead Location: 753860
Implantable Lead Model: 7122
Implantable Pulse Generator Implant Date: 20201007
Lead Channel Impedance Value: 460 Ohm
Lead Channel Impedance Value: 680 Ohm
Lead Channel Pacing Threshold Amplitude: 0.75 V
Lead Channel Pacing Threshold Amplitude: 0.75 V
Lead Channel Pacing Threshold Pulse Width: 0.5 ms
Lead Channel Pacing Threshold Pulse Width: 0.5 ms
Lead Channel Sensing Intrinsic Amplitude: 12 mV
Lead Channel Sensing Intrinsic Amplitude: 2 mV
Lead Channel Setting Pacing Amplitude: 2 V
Lead Channel Setting Pacing Amplitude: 2.5 V
Lead Channel Setting Pacing Pulse Width: 0.5 ms
Lead Channel Setting Sensing Sensitivity: 0.5 mV
Pulse Gen Serial Number: 9901215
Zone Setting Status: 755011

## 2023-07-07 NOTE — Progress Notes (Signed)
Remote ICD transmission.   

## 2023-07-12 ENCOUNTER — Ambulatory Visit: Payer: PRIVATE HEALTH INSURANCE

## 2023-08-04 ENCOUNTER — Ambulatory Visit: Payer: PRIVATE HEALTH INSURANCE

## 2023-08-11 ENCOUNTER — Inpatient Hospital Stay: Payer: PRIVATE HEALTH INSURANCE

## 2023-08-11 DIAGNOSIS — Z0000 Encounter for general adult medical examination without abnormal findings: Secondary | ICD-10-CM

## 2023-09-06 ENCOUNTER — Encounter (HOSPITAL_COMMUNITY): Payer: Self-pay

## 2023-09-06 ENCOUNTER — Telehealth (HOSPITAL_COMMUNITY): Payer: Self-pay | Admitting: Pharmacy Technician

## 2023-09-06 NOTE — Telephone Encounter (Signed)
Advanced Heart Failure Patient Advocate Encounter  The patient was approved for a [employer] grant that will help cover the cost of Entresto, Farxiga, Coreg, Spironolactone. Total amount awarded, $10,000. Eligibility, 08/15/23 - 08/13/24.  ID [id number]  BIN [id number]  PCN [id number]  Group [id number]  Sent information via mychart.  Archer Asa, CPhT

## 2023-09-19 ENCOUNTER — Telehealth (HOSPITAL_COMMUNITY): Payer: Self-pay | Admitting: Pharmacy Technician

## 2023-09-19 MED ORDER — DAPAGLIFLOZIN PROPANEDIOL 10 MG PO TABS: 10.0000 mg | ORAL_TABLET | Freq: Every day | ORAL | 3 refills | Status: AC

## 2023-09-19 NOTE — Addendum Note (Signed)
Addended by: Theresia Bough on: 09/19/2023 01:57 PM   Modules accepted: Orders

## 2023-09-19 NOTE — Telephone Encounter (Signed)
Advanced Heart Failure Patient Advocate Encounter    Patient was automatically approved to receive Farxiga from [employer] through 10/17/24   Document scanned to chart. Patient will be notified via mail. Patient currently using the assistance through previous enrollment end date. Sent 90 day RX request to Chantel (CMA) to send to [facility]. No further action needed at this time.   Archer Asa, CPhT

## 2023-09-30 ENCOUNTER — Ambulatory Visit: Payer: Medicare Other

## 2023-09-30 DIAGNOSIS — I255 Ischemic cardiomyopathy: Secondary | ICD-10-CM

## 2023-09-30 LAB — CUP PACEART REMOTE DEVICE CHECK
Battery Remaining Longevity: 61 mo
Battery Remaining Percentage: 62 %
Battery Voltage: 2.96 V
Brady Statistic AP VP Percent: 1 %
Brady Statistic AP VS Percent: 1.4 %
Brady Statistic AS VP Percent: 1 %
Brady Statistic AS VS Percent: 97 %
Brady Statistic RA Percent Paced: 1 %
Brady Statistic RV Percent Paced: 1 %
Date Time Interrogation Session: 20241213020021
HighPow Impedance: 88 Ohm
HighPow Impedance: 88 Ohm
Implantable Lead Connection Status: 753985
Implantable Lead Connection Status: 753985
Implantable Lead Implant Date: 20201007
Implantable Lead Implant Date: 20201007
Implantable Lead Location: 753859
Implantable Lead Location: 753860
Implantable Lead Model: 7122
Implantable Pulse Generator Implant Date: 20201007
Lead Channel Impedance Value: 450 Ohm
Lead Channel Impedance Value: 610 Ohm
Lead Channel Pacing Threshold Amplitude: 0.75 V
Lead Channel Pacing Threshold Amplitude: 0.75 V
Lead Channel Pacing Threshold Pulse Width: 0.5 ms
Lead Channel Pacing Threshold Pulse Width: 0.5 ms
Lead Channel Sensing Intrinsic Amplitude: 12 mV
Lead Channel Sensing Intrinsic Amplitude: 2.8 mV
Lead Channel Setting Pacing Amplitude: 2 V
Lead Channel Setting Pacing Amplitude: 2.5 V
Lead Channel Setting Pacing Pulse Width: 0.5 ms
Lead Channel Setting Sensing Sensitivity: 0.5 mV
Pulse Gen Serial Number: 9901215
Zone Setting Status: 755011

## 2023-10-06 MED ORDER — ATORVASTATIN CALCIUM 10 MG PO TABS
ORAL_TABLET | 0 refills
Start: 2023-10-06 — End: ?

## 2023-10-17 MED ORDER — ATORVASTATIN CALCIUM 10 MG PO TABS
ORAL_TABLET | 0 refills
Start: 2023-10-17 — End: ?

## 2023-11-03 NOTE — Progress Notes (Signed)
Remote ICD transmission.   

## 2023-11-16 ENCOUNTER — Ambulatory Visit: Payer: PRIVATE HEALTH INSURANCE

## 2023-11-16 DIAGNOSIS — M8580 Other specified disorders of bone density and structure, unspecified site: Secondary | ICD-10-CM

## 2023-11-16 DIAGNOSIS — Z23 Encounter for immunization: Secondary | ICD-10-CM

## 2023-11-16 DIAGNOSIS — K219 Gastro-esophageal reflux disease without esophagitis: Secondary | ICD-10-CM

## 2023-11-16 DIAGNOSIS — R7303 Prediabetes: Secondary | ICD-10-CM

## 2023-11-16 DIAGNOSIS — K5900 Constipation, unspecified: Secondary | ICD-10-CM

## 2023-11-16 DIAGNOSIS — R195 Other fecal abnormalities: Secondary | ICD-10-CM

## 2023-11-16 DIAGNOSIS — R1310 Dysphagia, unspecified: Secondary | ICD-10-CM

## 2023-11-16 DIAGNOSIS — Z1211 Encounter for screening for malignant neoplasm of colon: Secondary | ICD-10-CM

## 2023-11-16 DIAGNOSIS — E782 Mixed hyperlipidemia: Secondary | ICD-10-CM

## 2023-11-16 DIAGNOSIS — Z0000 Encounter for general adult medical examination without abnormal findings: Secondary | ICD-10-CM

## 2023-11-16 DIAGNOSIS — Z1231 Encounter for screening mammogram for malignant neoplasm of breast: Secondary | ICD-10-CM

## 2023-11-16 MED ORDER — ATORVASTATIN CALCIUM 10 MG PO TABS
10 mg | ORAL_TABLET | Freq: Every day | ORAL | 3 refills | Status: AC
Start: 2023-11-16 — End: ?

## 2023-11-16 NOTE — H&P
MEDICARE WELLNESS VISIT    PATIENT: Kerry Baxter   MRN: [id number]  DOB: [birth year]  DATE OF SERVICE: 11/16/2023  CHIEF COMPLAINT:   Chief Complaint   Patient presents with    Annual Exam      HPI   Kerry Baxter is a 68 y.o. female presents for a Medicare wellness visit.    Presents to establish care with new PCP.     Hyperlipidemia well controlled with atorvastatin 10 mg which she is tolerating without side effects.    FIT test 09/30/23 POSITIVE. Last colonoscopy 2013. No family history. Denies bloody or dark stools but has had some increase constipation recently. Reports not drinking much water. Taking fiber.    DXA showed osteopenia.     CHRONIC CONDITIONS     1.   []  Stable  []  Improved  []  Worsened   2.   []  Stable  []  Improved  []  Worsened   3.   []  Stable  []  Improved  []  Worsened     ADDITIONAL PROBLEMS:     CURRENT SYMPTOMS:      MEDICARE FUNCTIONAL ASSESSMENT:    Hearing Impairment:  No apparent deficit    Visual Impairment:  Patient wears corrective lenses:   Yes    Cognitive Impairment:  Not impaired    Does the patient handle his/her own medications:  Yes    Does the patient handle his/her own money:  Yes    Does the patient handle his/her own medications:  Yes    Is the patient self-reliant with household ADLs:  Yes      (i.e dressing, bathing, walking)    Is the patient safe at home:  Yes    Gait assessment:  Unaided, steady gait    Get up and to test:  Less than 30 seconds    Fall risk assessment: Has fallen 2 or more times in past one year, or feels unsteady when walking:       No    Depression Screen:                          Most Recent PHQ-2 Verbal Questionnaire:   If PHQ-2/PHQ-9 Score is blank, then no PHQ-9 screen was performed.     Most Recent PHQ-2 Score:      Most Recent PHQ-9 Score:          12/15/2016 12/26/2017 01/01/2019   Depression Screening (Patient Health Questionnaire PHQ)   PHQ-2: Feeling down, depressed, or hopeless No No No   PHQ-2: Little interest or pleassure in doing things No No No PHQ-9 Score   Depression Severity     Proposed Treatment Actions    (reference)      0  -  4 None - minimal  None     5  -  9  Mild  Watchful waiting; repeat PHQ-9 at follow-up    10 - 14  Moderate  Treatment plan, considering counseling, follow-up and/or pharmacotherapy    15 - 19  Moderately Severe  Active treatment with pharmacotherapy and/or psychotherapy    20 - 27  Severe  Immediate initiation of pharmacotherapy and, if severe impairment or poor response to therapy, expedited referral to a mental health specialist for psychotherapy and/or collaborative management        Advanced Care Plan:  Does patient have an advanced directive:                                          [  x]Yes                                        [] No     Advanced care planning was discussed with patient:                                        The following providers are regularly involved in providing medical care to the patient:    1.   2.   3.      Smoking status:  Never smoker.    PMH     Patient Active Problem List   Diagnosis    Hyperlipidemia    SK (seborrheic keratosis)    End of life care    Osteopenia    GERD (gastroesophageal reflux disease)     Past Medical History:   Diagnosis Date    End of life care 01/01/2019    ADVANCE CARE DIRECTIVE REVIEWED/ FORMS GIVEN WILL REVIEW AT NEXT VISIT     GERD (gastroesophageal reflux disease) Occasionally    Hyperlipidemia 11/28/2012    Insomnia     Seasonal allergies Decades ago    Controlled  with FloNase    Skin cancer 1 month ago    Will be removed next week    Sleep apnea Tested maybe 4 years sgo    Mild apnea     PSxH     Past Surgical History:   Procedure Laterality Date    COLONOSCOPY  01/07/2012    Procedure: COLONOSCOPY;  Surgeon: Joslyn Devon, MD;  Location: [facility];  Service: Gastroenterology;  Laterality: N/A;  scope C#11    EYE SURGERY  Laser--partially detached retina    UPPER GASTROINTESTINAL ENDOSCOPY  01/07/2012    Procedure: UPPER GI ENDOSCOPY;  Surgeon: Joslyn Devon, MD;  Location: [facility];  Service: Gastroenterology;  Laterality: N/A;  Scope E# 6     ALL     Allergies   Allergen Reactions    Sulfa Antibiotics Rash     MEDS     Medications that the patient states to be currently taking   Medication Sig    atorvastatin 10 mg tablet Take 1 tablet (10 mg total) by mouth daily.    fluticasone (FLONASE ALLERGY RELIEF) 50 mcg/act nasal spray     [DISCONTINUED] ATORVASTATIN 10 mg tablet TAKE 1 TABLET BY MOUTH EVERY DAY     [facility]     Family History   Problem Relation Age of Onset    Skin cancer Mother     Heart disease Mother         Closing heart valva    Heart disease Father         Clising heart valve    Stroke Father         Dementia presumably from strokes    Dementia Father     Arthritis Sister     Asthma Sister     Depression Sister     Diabetes Other         Family Hx    Mental illness Other         Family Hx    Asthma Other         Family Hx    Allergies Other         Family  Hx    Colon cancer Neg Hx     Breast cancer Neg Hx     Uterine cancer Neg Hx     Ovarian cancer Neg Hx      Social History     Tobacco Use    Smoking status: Never    Smokeless tobacco: Never   Substance Use Topics    Alcohol use: Not Currently     Alcohol/week: 2.4 - 4.8 oz     Types: 4 - 8 Standard drinks or equivalent per week         [x]  Family history reviewed     HEALTH MAINTENANCE     Check if completed   DATE COMPLETED RESULT DECLINED    []  Mammogram   []     []  Bone density   []     []  Pap smear   []     []  Colonosocpy   []        Immunizations:  Immunization History   Administered Date(s) Administered    COVID-19 (Moderna 12y and up) PF, 50 mcg/0.5 mL 08/24/2022, 01/25/2023, 06/18/2023    COVID-19, mRNA, (Moderna) 100 mcg/0.5 mL 01/10/2020, 02/07/2020, 09/04/2020, 01/15/2021    COVID-19, mRNA, bivalent (Moderna) 50 mcg/0.5 mL (12y and up) 07/02/2021    Influenza Whole 06/18/2018    RSV vaccine, bivalent, PF (Abrysvo) 08/04/2022    Td 10/18/2006, 08/18/2020    Tdap 10/15/2013    influenza vaccine IM quadrivalent (Fluarix Quad) (PF) SYR (63 months of age and older) 06/20/2019    influenza vaccine IM quadrivalent (Fluzone Quad) (PF) SYR/SDV (8 months of age and older) 08/13/2014, 07/03/2015    influenza vaccine IM quadrivalent (Fluzone Quad) MDV (26 months of age and older) 07/06/2016    influenza vaccine IM quadrivalent adjuvanted (FluAD Quad) (PF) SYR (53 years of age and older) 07/02/2021, 06/30/2022    influenza vaccine IM trivalent high dose (Fluzone High Dose) (PF) SYR (60 years of age and older) 06/18/2023    influenza, unspecified formulation 07/07/2011, 07/19/2013, 06/18/2017, 07/13/2017, 06/18/2018, 07/24/2020, 06/18/2023    pneumococcal conjugate vaccine 13-valent (Prevnar) 10/31/2014, 12/15/2016    pneumococcal polysaccharide vaccine 23-valent (Pneumovax) 01/01/2019    zoster vac recomb adjuvanted (Shingrix) 01/01/2019, 06/20/2019         IMMUNIZATIONS:                                           Up to date     Ordered today   Declined  1.Tetanus/Tdap  []    []    []     2.Pneumovax  []    []    []     3.Prevnar   []    []    []       4.Shingles  []    []    []     5 .Influenza  []    []    []         ROS          []  Not Obtainable         (Check normal findings, blank box means not reviewed)          (Note positive findings)     [x]  Constitutional:   no weakness, dizziness, wt change     [x]  Eyes:   no itching, tearing, blurred vision     [x]  ENMT:   no runny nose, sneezing, hoarseness     [x]  Respiratory:   no shortness of  breath, cough, wheeze     [x]  Cardiovascular:    no chest pain, palpitations        [x]  Gastrointestinal:    no nausea, vomiting, diarrhea     [x]  Genitourinary:    no frequency, dysuria, nocturia     [x]  Skin:    no rash, itching     [x]  Musculoskeletal:    no joint pain, myalgia, tendinitis     [x]  Neurological:    no headache, tremor, incoordination       [x]  Endocrine:  no hot flashes, diabetes, thyroid disorder     [x]  Heme/lymphatic:    no swollen glands, bruising     [x]  Allergic/immunologic:    no allergy or autoimmunity     [x]  Psychiatric:    no anxiety or depression        In depth counseling regarding lifestyle issue(s) as follows:    Review of opioid use:    Not applicable, not on opioids.       PHYSICAL EXAM   Vital Signs:  BP 118/73  ~ Pulse 77  ~ Temp 36.1 ?C (97 ?F) (Tympanic)  ~ Resp 15  ~ Ht 5' 6.5'' (1.689 m)  ~ Wt 140 lb (63.5 kg)  ~ SpO2 98%  ~ BMI 22.26 kg/m?     (Blank box means not examined)  (Check if examined)        (Check if normal)                           (Note positive findings)  [x]  Gen [x]  Appears well and in no distress      [x]  Head [x]  Normocephalic, atraumatic                                [x]  Eyes [x]   PERRL, EOMI  [x]   Conjunctiva normal    [x]  ENT [x]  Ear canals and tympanic membranes normal  [x]  Turbinates normal  [x]  Oropharynx normal    [x]  Neck [x]  Supple, full range of motion  [x]  Thyroid normal    []  Breasts []  No asymmetry, mass, discharge, or tenderness    [x]  Lungs [x]  Clear breath sounds, no rales, rhonchi, wheezes    [x]  Heart [x]  Regular rhythm, no gallop, rub, murmur    [x]  Vascular [x]  Normal pulses  [x]  No carotid or abdominal bruits  [x]  No edema    [x]  Abdomen [x]  Normal bowel sounds, no hepatosplenomegaly,              [x]   No guarding, tenderness, masses           []  GU Female []  Normal EGBUS, vagina, cervix, urethral meatus  []  BME: uterus nontender, mobile, no adenexal masses or tenderness  []  PAP obtained Deferred    []  Digital rectal exam []  Normal sphincter tone, no hemorrhoids, tags or fissures  []  Normal anus and perineum    [x]  M/skeletal       [x]  Normal gait, balance, muscle tone      [x]  Neuro [x]  Cranial nerves intact  [x]  Reflexes intact  [x]  Motor/sensory exam nonfocal    [x]  Skin [x]  No significant lesions seen      [x]  Psych [x]  Normal insight, judgment, affect, cognition            LABS/STUDIES   I have   [] reviewed radiology,  [] reviewed labs, [] reviewed diag  med test, [] reviewed & summarized old records, []  requested outside medical records.  [] reviewed EKG    A&P   PROBLEM/PLAN  Problem List Items Addressed This Visit          Cardiac and Vasculature    Hyperlipidemia    Relevant Medications    atorvastatin 10 mg tablet    Other Relevant Orders    Lipid Panel       Gastrointestinal and Abdominal    GERD (gastroesophageal reflux disease)    Relevant Orders    Referral to Gastroenterology       Musculoskeletal and Injuries    Osteopenia     Other Visit Diagnoses       Encounter for subsequent annual wellness visit (AWV) in Medicare patient    -  Primary    Relevant Orders    CBC & Auto Differential    Comprehensive Metabolic Panel    Lipid Panel    Hgb A1c    TSH with reflex FT4, FT3    Prediabetes        Relevant Orders    Hgb A1c    Constipation, unspecified constipation type        Relevant Orders    Referral to Gastroenterology    Abnormal stool test        Relevant Orders    Referral to Gastroenterology    Colon cancer screening        Relevant Orders    Referral to Gastroenterology    Screening mammogram, encounter for        Relevant Orders    Mammo tomosynthesis, screening, bilat breast    Need for prophylactic vaccination against Streptococcus pneumoniae (pneumococcus)        Relevant Orders    pneumococcal conjugate vaccine 20-valent (PREVNAR 20) - preferred vaccine for all individuals >=50    Dysphagia, unspecified type        Relevant Orders    Referral to Gastroenterology          Urgent GI referral   Metamucil PRN   Push fluids   Current ACOG guidelines for cervical cancer screening discussed  Prevnar 20 prefers to return another day   Return precautions       Orders Placed This Encounter    Mammo tomosynthesis, screening, bilat breast    pneumococcal conjugate vaccine 20-valent (PREVNAR 20) - preferred vaccine for all individuals >=50    CBC & Auto Differential    Comprehensive Metabolic Panel    Lipid Panel    Hgb A1c    TSH with reflex FT4, FT3    Referral to Gastroenterology    atorvastatin 10 mg tablet       There are no Patient Instructions on file for this visit.    The above recommendation were discussed with the patient.  The patient has all questions answered satisfactorily and is in agreement with this recommended plan of care.    Author:  Lorita Officer. Macaluso9:56 AM

## 2023-11-29 ENCOUNTER — Telehealth: Payer: Self-pay

## 2023-11-29 NOTE — Telephone Encounter (Signed)
Report reviewed. Normal function. Patient made aware.

## 2023-11-29 NOTE — Telephone Encounter (Signed)
Pt called in because she had a dream that she touched an electric fence and wants to send in a transmission and would like a call back

## 2023-12-04 ENCOUNTER — Other Ambulatory Visit: Payer: Self-pay | Admitting: Gastroenterology

## 2023-12-06 NOTE — Telephone Encounter (Signed)
One refill provided. Need ov.

## 2023-12-27 ENCOUNTER — Ambulatory Visit: Payer: PRIVATE HEALTH INSURANCE

## 2023-12-27 DIAGNOSIS — Z23 Encounter for immunization: Secondary | ICD-10-CM

## 2023-12-30 ENCOUNTER — Ambulatory Visit (INDEPENDENT_AMBULATORY_CARE_PROVIDER_SITE_OTHER): Payer: Self-pay

## 2023-12-30 DIAGNOSIS — I255 Ischemic cardiomyopathy: Secondary | ICD-10-CM | POA: Diagnosis not present

## 2023-12-30 LAB — CUP PACEART REMOTE DEVICE CHECK
Battery Remaining Longevity: 59 mo
Battery Remaining Percentage: 60 %
Battery Voltage: 2.96 V
Brady Statistic AP VP Percent: 1 %
Brady Statistic AP VS Percent: 1.4 %
Brady Statistic AS VP Percent: 1 %
Brady Statistic AS VS Percent: 97 %
Brady Statistic RA Percent Paced: 1 %
Brady Statistic RV Percent Paced: 1 %
Date Time Interrogation Session: 20250314020017
HighPow Impedance: 86 Ohm
HighPow Impedance: 86 Ohm
Implantable Lead Connection Status: 753985
Implantable Lead Connection Status: 753985
Implantable Lead Implant Date: 20201007
Implantable Lead Implant Date: 20201007
Implantable Lead Location: 753859
Implantable Lead Location: 753860
Implantable Lead Model: 7122
Implantable Pulse Generator Implant Date: 20201007
Lead Channel Impedance Value: 490 Ohm
Lead Channel Impedance Value: 680 Ohm
Lead Channel Pacing Threshold Amplitude: 0.75 V
Lead Channel Pacing Threshold Amplitude: 0.75 V
Lead Channel Pacing Threshold Pulse Width: 0.5 ms
Lead Channel Pacing Threshold Pulse Width: 0.5 ms
Lead Channel Sensing Intrinsic Amplitude: 12 mV
Lead Channel Sensing Intrinsic Amplitude: 3.4 mV
Lead Channel Setting Pacing Amplitude: 2 V
Lead Channel Setting Pacing Amplitude: 2.5 V
Lead Channel Setting Pacing Pulse Width: 0.5 ms
Lead Channel Setting Sensing Sensitivity: 0.5 mV
Pulse Gen Serial Number: 9901215
Zone Setting Status: 755011

## 2024-02-01 NOTE — Progress Notes (Signed)
 Remote ICD transmission.

## 2024-02-01 NOTE — Progress Notes
 [facility]  Outpatient Consult Note        ATTENDING: Jurline Olmsted, MD  PATIENT: Kerry Baxter  MRN: [id number]  DOB: 06/25/1956  DATE OF SERVICE: 02/23/2024  REFERRING PROVIDER: Diedre Fox., MD  PRIMARY CARE PROVIDER: Diedre Fox., MD         Subjective:     Chief Complaint:  Kerry Baxter is a 68 y.o. female who presents for:   No chief complaint on file.        Subjective      68 y.o. female presenting for evaluation of ***. Denies associated nausea, vomiting, diarrhea, constipation, abd pain, weight loss, melena, or hematochezia.     - Pt last seen by Dr. Macaluso on 11/16/2023.  Constipation, unspecified constipation type          Relevant Orders     Referral to Gastroenterology     Abnormal stool test         Relevant Orders     Referral to Gastroenterology     Colon cancer screening         Relevant Orders     Referral to Gastroenterology     Dysphagia, unspecified type          Relevant Orders     Referral to Gastroenterology   Urgent GI referral   Metamucil PRN   Push fluids   Current ACOG guidelines for cervical cancer screening discussed  Prevnar 20 prefers to return another day   Return precautions     Past Medical & Surgical History:  Patient Active Problem List   Diagnosis    Hyperlipidemia    SK (seborrheic keratosis)    Osteopenia    GERD (gastroesophageal reflux disease)      Past Medical History:   Diagnosis Date    End of life care 01/01/2019    ADVANCE CARE DIRECTIVE REVIEWED/ FORMS GIVEN WILL REVIEW AT NEXT VISIT     GERD (gastroesophageal reflux disease) Occasionally    Hyperlipidemia 11/28/2012    Insomnia     Seasonal allergies Decades ago    Controlled  with FloNase    Skin cancer 1 month ago    Will be removed next week    Sleep apnea Tested maybe 4 years sgo    Mild apnea     Past Surgical History:   Procedure Laterality Date    COLONOSCOPY  01/07/2012    Procedure: COLONOSCOPY;  Surgeon: Trudy Fusi, MD;  Location: [facility];  Service: Gastroenterology;  Laterality: N/A;  scope C#11    EYE SURGERY  Laser--partially detached retina    UPPER GASTROINTESTINAL ENDOSCOPY  01/07/2012    Procedure: UPPER GI ENDOSCOPY;  Surgeon: Trudy Fusi, MD;  Location: [facility];  Service: Gastroenterology;  Laterality: N/A;  Scope E# 6       Relevant Family History:    Family History   Problem Relation Age of Onset    Skin cancer Mother     Heart disease Mother         Closing heart valva    Heart disease Father         Clising heart valve    Stroke Father         Dementia presumably from strokes    Dementia Father     Arthritis Sister     Asthma Sister     Depression Sister     Diabetes Other         Family Hx  Mental illness Other         Family Hx    Asthma Other         Family Hx    Allergies Other         Family Hx    Colon cancer Neg Hx     Breast cancer Neg Hx     Uterine cancer Neg Hx     Ovarian cancer Neg Hx          Relevant Social History:  Social History     Tobacco Use    Smoking status: Never    Smokeless tobacco: Never   Substance Use Topics    Alcohol  use: Not Currently     Alcohol /week: 2.4 - 4.8 oz     Types: 4 - 8 Standard drinks or equivalent per week    Drug use: No           MEDICATIONS:     No outpatient medications have been marked as taking for the 02/23/24 encounter (Appointment) with Emilia Harbour., MD.         Allergies :    is allergic to sulfa antibiotics.    Objective     PHYSICAL EXAM    There were no vitals filed for this visit.   There is no height or weight on file to calculate BMI.    There were no vitals filed for this visit.      System Check if examined and normal Positive or additional negative findings   Constit  []  General appearance - normal     Eyes  []  Conjuctivae clear       HENMT  []  Normal Lips/teeth/gums, oropharynx, head     Neck  []  Inspection/palpation; thyroid normal      Resp  []  Effort good. Clear to auscultation bilaterally.     CV  []  Normal Rhythm/rate without murmur     Abdomen  []  Abdomen soft and NT, no rebound/guarding   []  Active BS 4 quadrants   []  No appreciable flank or shifting dullness   []  No hepatosplenomegaly   []  No umbilical hernia    Rectal   []  No external hemorrhoids   []  No masses palpated on DRE   []  Brown stool in vault   []   Appropriate resting and squeeze pressure   []   Appropriate bear down maneuver      MSK  []  Grossly normal strength, ROM     Neuro   []  Grossly normal      Psychiatric  []   Oriented to time, place and person   []   Appropriate insight/judgement & mood/affect                Lab Review:  Lab Results   Component Value Date    WBC 4.45 11/16/2023    HGB 13.0 11/16/2023    HCT 41.1 11/16/2023    MCV 91.7 11/16/2023    PLT 218 11/16/2023     Lab Results   Component Value Date    CREAT 0.81 11/16/2023    BUN 13 11/16/2023    NA 141 11/16/2023    K 4.1 11/16/2023    CL 104 11/16/2023    CO2 26 11/16/2023    ALT 22 11/16/2023    AST 25 11/16/2023    ALKPHOS 74 11/16/2023    BILITOT 0.4 11/16/2023    ALBUMIN 4.2 11/16/2023     Lab Results   Component Value Date    SRWEST  1 01/03/2012    CRP <0.3 01/03/2012     No results found for: ''INR'', ''FE'', ''FEBINDSAT'', ''TIBC'', ''FERRITIN'', ''VITAMINB12'', ''FOLATE''  No results found for: ''HELPYLAB'', ''HELPYLAGSTL''            Imaging:    --        GI Studies:  Colonoscopy 01/07/2012  Findings: The colonoscopy examination was completely normal.  Endoscopic Impressions:  ? Normal colonoscopy.  Recommendations:  ? Colonoscopy recommended in 10 years.    Assessment & Plan         Visit Diagnoses  / Problems addressed on 02/23/2024:   Kerry Baxter is a 68 y.o. female who presents for    ***    Labs 11/16/2023: TSH 1.5, alkaline phosphatase 74, AST 25, ALT 22, hgb 13.0, hematocrit 41.1, MCV 91.7.  FIT negative 09/06/2022    ##    ##               The above recommendation were discussed with the patient. The patient has all questions answered satisfactorily and is in agreement with this recommended plan of care.    Jurline Olmsted, MD  [facility] Clinical Instructor  Internal Medicine & Gastroenterology  Division of Digestive Disease  [address], [address]  [place], [place] [address]  02/23/2024          SCRIBE SIGNATURE(S):  I, Arnoldo Lapping, am scribing for, and in the presence of Dr. Jurline Olmsted.    PHYSICIAN SIGNATURE(S):  I, Dr. Jurline Olmsted, personally performed the services described in this documentation, as scribed by Arnoldo Lapping in my presence, and it is both accurate and complete.            There are no Patient Instructions on file for this visit.

## 2024-02-23 ENCOUNTER — Ambulatory Visit: Payer: PRIVATE HEALTH INSURANCE

## 2024-02-23 ENCOUNTER — Telehealth: Payer: PRIVATE HEALTH INSURANCE

## 2024-02-23 DIAGNOSIS — Z1211 Encounter for screening for malignant neoplasm of colon: Secondary | ICD-10-CM

## 2024-02-23 DIAGNOSIS — R195 Other fecal abnormalities: Secondary | ICD-10-CM

## 2024-02-23 DIAGNOSIS — K219 Gastro-esophageal reflux disease without esophagitis: Secondary | ICD-10-CM

## 2024-02-23 MED ORDER — NA SULFATE-K SULFATE-MG SULF 17.5-3.13-1.6 GM/177ML PO SOLN
TOPICAL | 0 refills | 1.00000 days | Status: AC
Start: 2024-02-23 — End: ?

## 2024-02-23 MED ORDER — OMEPRAZOLE 20 MG PO CPDR
20 mg | ORAL_CAPSULE | Freq: Every day | ORAL | 2 refills | 90.00000 days | Status: AC
Start: 2024-02-23 — End: ?

## 2024-02-23 NOTE — Telephone Encounter
 Call Back Request      Reason for call back: Patient would like to schedule procedure.  Please assist     Any Symptoms:  []  Yes  [x]  No      If yes, what symptoms are you experiencing:    Duration of symptoms (how long):    Have you taken medication for symptoms (OTC or Rx):      If call was taken outside of clinic hours:    [] Patient or caller has been notified that this message was sent outside of normal clinic hours.     [] Patient or caller has been warm transferred to the physician's answering service. If applicable, patient or caller informed to please call us  back if symptoms progress.  Patient or caller has been notified of the turnaround time of 1-2 business day(s).

## 2024-02-23 NOTE — Telephone Encounter
LVM informing pt to return call to schedule procedure.

## 2024-02-23 NOTE — Patient Instructions
 Lifestyle Changes for Controlling GERD  When you have GERD, stomach acid feels as if it?s backing up toward your mouth. Making lifestyle changes can often improve your symptoms. This is true if you take medicine to control your GERD or not. Talk with your healthcare provider about the following suggestions. They may help you get relief from your symptoms.   Raise your head    Reflux is more likely to happen when you?re lying down flat. That's because stomach fluid can flow backward more easily. Raising the head of your bed 4 to 6 inches can help. To do this:   Slide blocks or books under the legs at the head of your bed. Or put a wedge under the mattress. Many foam stores can make a wedge for you. The wedge should go from your waist to the top of your head.  Don?t just prop your head up on a few pillows. This increases pressure on your stomach. It can make GERD worse.  Watch your eating habits  Certain foods may increase the acid in your stomach. Or they may relax the lower esophageal sphincter. This makes GERD more likely. It?s best to avoid the following if they cause you symptoms:   Coffee, tea, and carbonated drinks (with and without caffeine)  Fatty, fried, or spicy food  Mint, chocolate, onions, tomatoes, and citrus  Peppermint  Any other foods that seem to irritate your stomach or cause you pain  Relieve the pressure  Tips include the following:  Eat smaller meals, even if you have to eat more often.  Don?t lie down right after you eat. Wait a few hours for your stomach to empty.  Don't wear tight belts or tight-fitting clothes.  Lose any extra weight.  Tobacco and alcohol  Don't smoke tobacco or drink alcohol. They can make GERD symptoms worse.   [employer] last reviewed this educational content on 11/18/2020  ? 2000-2023 [employer], [place]. All rights reserved. This information is not intended as a substitute for professional medical care. Always follow your healthcare professional's instructions.

## 2024-02-28 ENCOUNTER — Other Ambulatory Visit: Payer: PRIVATE HEALTH INSURANCE

## 2024-03-01 ENCOUNTER — Telehealth: Payer: PRIVATE HEALTH INSURANCE

## 2024-03-01 ENCOUNTER — Other Ambulatory Visit: Payer: PRIVATE HEALTH INSURANCE

## 2024-03-01 ENCOUNTER — Inpatient Hospital Stay: Payer: PRIVATE HEALTH INSURANCE

## 2024-03-01 NOTE — Telephone Encounter
 [facility] Checklist    Procedure Scheduled:   Colonoscopy    Prep instructions have been delivered to patient via:   Mychart     Encounter sent to [facility] team if the following applies:   N/A    Sedation Method:   MAC- MAC script was given     MD/PREP: (open access pts)   Stavrakis- Suprep

## 2024-03-21 ENCOUNTER — Other Ambulatory Visit: Payer: PRIVATE HEALTH INSURANCE

## 2024-03-29 ENCOUNTER — Telehealth (HOSPITAL_COMMUNITY): Payer: Self-pay

## 2024-03-29 NOTE — Telephone Encounter (Signed)
 Called to confirm/remind patient of their appointment [facility] on 03/30/2024 9:00.   Appointment:   [x] Confirmed  [] Left mess   [] No answer/No voice mail  [] VM Full/unable to leave message  [] Phone not in service  Patient reminded to bring all medications and/or complete list.  Confirmed patient has transportation. Gave directions, instructed to utilize valet parking.

## 2024-03-29 NOTE — Progress Notes (Signed)
 [facility] Note  Date:  03/30/2024   ID:  Jennifer [name], DOB Sep 21, 1956, MRN [id number]  Location: Home  Provider location: [place] [facility] Type of Visit: Established patient   PCP:  Theoplis Fix, MD  Cardiologist:  Drake Gens, MD (Inactive) HF Cardiologist: Dr Jennifer Ny  EP: Dr Lawana Pray  HPI: Jennifer [name] is a 68 y.o.Jennifer [name] female with a history of systolic HF due to mixed ICM/NICM, DVT, PE, fibromyalgia, GI bleed, and CAD CABG.   Long h/o reported mild NICM EF 40-45%. Echo 11/19 EF 15-20%  Catheterization done 09/21/18 revealed 90% proximal LAD and a 50% circumflex stenosis.  She underwent bypass grafting x2 with an LIMA to the LAD and an SVG to the OM on 09/22/18 with Dr Luna Salinas.  Post CABG EF depressed.   Zio Patch 5/20 7.3 % PVCs 1. Predominant underlying rhythm was Sinus Rhythm. Minimum HR of 56 bpm, max HR of 176 bpm, and avg HR of 77 bpm. 2. Three runs of NSVT - longest 10 beats 3. Seven runs of SVT - longest lasting 9 beats   Started carvedilol  03/13/2019    Underwent SJ ICD 07/25/19   Had GI bleed in in 8/22 in [place] . EGD showed 1 oozing cratered duodenal ulcer with visible vessel in the duodenal bulb and in the first part portion of the duodenum. Lesion was 9 mm in largest dimension. 3 hemostatic clips were successfully placed to stop the bleeding. Additionally 4 nonbleeding cratered gastric ulcers with no stigmata of bleeding were found in the gastric antrum. A small hiatal hernia was also appreciated. No further bleeding. Back on Xarelto .   Follow up 10/22, no further bleeding on Xarelto . Switched Xarelto  to Eliquis .  Today she returns for HF follow up.Overall feeling fine. Gets SOB a little more often. Not able to walk as far. Says she hs gained weight. Denies PND/Orthopnea. Appetite ok. Struggling to lose weight. No fever or chills. Weight at home trending up.  Taking all medications.  Cardiac Studies: Echo  09/29/22,  EF appears 25% per Dr Jennifer Ny  3/21 EF 20-25% RV normal.  5/20 EF 20-25% RV severely reduced 2/20 EF 10-15% with moderate RV dysfunction.   - CPX 07/09/19  FVC 2.09 (73%)      FEV1 1.47 (65%)        FEV1/FVC 70 (88%)        MVV 70 (81%)       Resting HR: 52 Standing HR:48 Peak HR: 102   (65% age predicted max HR)  BP rest: 84/60 Standing BP:90/60 BP peak: 124/60  Peak VO2: 16.0 (89% predicted peak VO2)  VE/VCO2 slope:  29  OUES: 1.81  Peak RER: 1.05  VE/MVV:  67%   cMRI 12/05/18 1. Moderately dilated LV with EF 21%, diffuse hypokinesis.  2. Normal RV size with moderately decreased systolic function, EF 31%. 3. Moderate TR, at least moderate central MR (likely functional). 4. No myocardial LGE, so no evidence for prior infarct, infiltrative disease, or myocarditis.  - RHC 12/05/18 On milrinone  0.125 RA = 8 RV = 37/10 PA = 39/15 (25) PCW = 19 (v = 35) Fick cardiac output/index = 5.0/2.8 PVR = 1.1 WU Ao sat = 94% PA sat = 66%, 66% PaPi = 3.0 RA/PCW = 0.42  Past Medical History:  Diagnosis Date   Arthritis    probably; maybe in my hands (09/21/2018)   Congestive heart failure (CHF) (HCC)    Depression  Dyspnea    Fibromyalgia    GERD (gastroesophageal reflux disease)    Hypercholesteremia    Hypertension    IBS (irritable bowel syndrome)    Mild sleep apnea    no CPAP (09/21/2018)   Myocardial infarction [facility])    was told I have had one; never knew about it (09/21/2018)   Nonischemic cardiomyopathy (HCC)    ECHO EF 40-45%, Septal Thickness,and mild global left ventricular dysfunction; stress test 03/2004 +VE, refersible defect; ECHO 03/2005 @[facility]  EF 55%   Pulmonary embolus [facility])    Past Surgical History:  Procedure Laterality Date   ANKLE FRACTURE SURGERY Left 2000   BIOPSY  09/17/2021   Procedure: BIOPSY;  Surgeon: Suzette Espy, MD;  [facility]: [facility];  Service: Endoscopy;;   BUNIONECTOMY Right 1990s?   & removed part of my great toe due to  fungus under nail   CARDIAC CATHETERIZATION  03/2005   CATARACT EXTRACTION, BILATERAL Bilateral    COLONOSCOPY  07/2011   Dr. Alline Ivans at [facility] polyp found in the distal transverse colon, approximately 1 cm in size, removed.  Mild diverticulosis.  Internal hemorrhoids.  Path report available.   COLONOSCOPY WITH PROPOFOL  N/A 10/06/2022   Procedure: COLONOSCOPY WITH PROPOFOL ;  Surgeon: Vinetta Greening, DO;  [facility];  Service: Endoscopy;  Laterality: N/A;  9:15 am   CORONARY ARTERY BYPASS GRAFT N/A 09/22/2018   Procedure: CORONARY ARTERY BYPASS GRAFTING (CABG), ON PUMP, TIMES TWO, USING LEFT INTERNAL MAMMARY ARTERY AND ENDOSCOPICALLY HARVESTED RIGHT GREATER SAPHENOUS VEIN;  Surgeon: Zelphia Higashi, MD;  Location: [facility] OR;  Service: Open Heart Surgery;  Laterality: N/A;   DILATION AND CURETTAGE OF UTERUS     ESOPHAGOGASTRODUODENOSCOPY (EGD) WITH PROPOFOL  N/A 09/17/2021   Procedure: ESOPHAGOGASTRODUODENOSCOPY (EGD) WITH PROPOFOL ;  Surgeon: Suzette Espy, MD;  [facility];  Service: Endoscopy;  Laterality: N/A;  8:30am   FRACTURE SURGERY     ICD IMPLANT N/A 07/25/2019   Procedure: ICD IMPLANT;  Surgeon: Lei Pump, MD;  Location: [facility] INVASIVE CV LAB;  Service: Cardiovascular;  Laterality: N/A;   POLYPECTOMY  10/06/2022   Procedure: POLYPECTOMY;  Surgeon: Vinetta Greening, DO;  [facility];  Service: Endoscopy;;   RIGHT HEART CATH N/A 12/05/2018   Procedure: RIGHT HEART CATH;  Surgeon: Mardell Shade, MD;  Location: [facility] INVASIVE CV LAB;  Service: Cardiovascular;  Laterality: N/A;   RIGHT/LEFT HEART CATH AND CORONARY ANGIOGRAPHY N/A 09/21/2018   Procedure: RIGHT/LEFT HEART CATH AND CORONARY ANGIOGRAPHY;  Surgeon: Millicent Ally, MD;  Location: [facility] INVASIVE CV LAB;  Service: Cardiovascular;  Laterality: N/A;   TEE WITHOUT CARDIOVERSION N/A 09/22/2018   Procedure: TRANSESOPHAGEAL ECHOCARDIOGRAM (TEE);  Surgeon: Zelphia Higashi, MD;   Location: [facility] OR;  Service: Open Heart Surgery;  Laterality: N/A;   TUBAL LIGATION     Current Outpatient Medications  Medication Sig Dispense Refill   albuterol (VENTOLIN HFA) 108 (90 Base) MCG/ACT inhaler Inhale 1-2 puffs into the lungs every 6 (six) hours as needed for shortness of breath or wheezing.     apixaban  (ELIQUIS ) 2.5 MG TABS tablet Take 1 tablet (2.5 mg total) by mouth 2 (two) times daily. 60 tablet 11   Ascorbic Acid  (VITAMIN C ) 1000 MG tablet Take 1,000 mg by mouth daily.     atorvastatin  (LIPITOR ) 80 MG tablet Take 1 tablet (80 mg total) by mouth daily at 6 PM. 30 tablet 1   calamine lotion Apply 1 application topically as  needed for itching.      carvedilol  (COREG ) 25 MG tablet TAKE 1 TABLET (25 MG TOTAL) BY MOUTH 2 (TWO) TIMES DAILY. KEEP UPCOMING APPT. 60 tablet 0   cholecalciferol  (VITAMIN D3) 25 MCG (1000 UT) tablet Take 1,000 Units by mouth daily.     dapagliflozin  propanediol (FARXIGA ) 10 MG TABS tablet Take 1 tablet (10 mg total) by mouth daily before breakfast. 90 tablet 3   DULoxetine  (CYMBALTA ) 60 MG capsule Take 60 mg by mouth 2 (two) times daily.     furosemide  (LASIX ) 40 MG tablet Take 40 mg by mouth daily as needed for edema.     gabapentin  (NEURONTIN ) 300 MG capsule Take 600 mg by mouth 3 (three) times daily.     pantoprazole  (PROTONIX ) 40 MG tablet TAKE 1 TABLET (40 MG TOTAL) BY MOUTH TWICE A DAY BEFORE MEALS 180 tablet 0   Phenylephrine -APAP-guaiFENesin (MUCINEX SINUS-MAX PO) Take 2 tablets by mouth daily as needed (sinus headaches).     polyvinyl alcohol  (LIQUIFILM TEARS) 1.4 % ophthalmic solution Place 1 drop into both eyes as needed for dry eyes.     pramipexole  (MIRAPEX ) 0.5 MG tablet Take 0.5 mg by mouth at bedtime.      sacubitril -valsartan  (ENTRESTO ) 97-103 MG Take 1 tablet by mouth 2 (two) times daily. 180 tablet 3   spironolactone  (ALDACTONE ) 25 MG tablet TAKE 1 TABLET EVERY DAY 90 tablet 3   vitamin B-12 (CYANOCOBALAMIN ) 500 MCG tablet Take 1 tablet  (500 mcg total) by mouth daily. 30 tablet 0   No current facility-administered medications for this encounter.    Allergies:   Poison oak extract   Social History:  The patient  reports that she quit smoking about 33 years ago. Her smoking use included cigarettes. She started smoking about 63 years ago. She has a 30 pack-year smoking history. She has never used smokeless tobacco. She reports that she does not currently use alcohol . She reports that she does not use drugs.   Family History:  The patient's family history includes Alcohol  abuse in her father; Colon cancer in her father; Congestive Heart Failure (age of onset: 78) in her brother; Depression in her father; Diabetes Mellitus II in her mother; Heart disease (age of onset: 65) in her mother.   ROS:  Please see the history of present illness.   All other systems are personally reviewed and negative.  Vitals:   03/30/24 0856  BP: 102/64  Pulse: 65  SpO2: 97%    Recent Labs: No results found for requested labs within last 365 days.  Personally reviewed   BP 102/64   Pulse 65   Wt 108.6 kg (239 lb 8 oz)   SpO2 97%   BMI 42.43 kg/m   Wt Readings from Last 3 Encounters:  03/30/24 108.6 kg (239 lb 8 oz)  10/06/22 103.4 kg (227 lb 15.3 oz)  10/04/22 103.4 kg (228 lb)    Physical Exam General:   No resp difficulty Neck: supple. no JVD.  Cor: PMI nondisplaced. Regular rate & rhythm. No rubs, gallops or murmurs. Lungs: clear Abdomen: soft, nontender, nondistended.  Extremities: no cyanosis, clubbing, rash, edema Neuro: alert & oriented x3  ICD interrogation: No VT. Impedance trending up.   ASSESSMENT AND PLAN:  1. Biventricular Chronic Systolic HF  Suspect mixed ICM/NICM (?PVCs) - LHC 2019 90% LAD and 50% circumflex --> S/P CABG x2 LIMA to LAD and SVG to OM  - Echo (2/20: EF 10-15% moderate RV dysfunction.  - cMRI (2/20):  LVEF 21% Moderately reduced RV, moderateTR moderate to severe MR. No LGE - Echo (5/20): EF  20-25% RV severerly reduced - Zio Patch 5/20 with 7.3% PVCs.  - Echo (9/20): EF 20-25% severe MR - CPX test (9/20) with preserved functional capacity but low HR. Off ivabradine   - Echo (3/21): EF 20-25%, RV normal, mild MR   - Echo (10/21): EF 25-30% RV normal mild MR.  - s/p ST Jude ICD (10/20) -Repeat Echo. NYHA III. Appears euvolemic.  - Continue Entresto  97/103 mg bid. - Continue spironolactone  25 mg daily.  - Continue Farxiga  10 mg daily. - Continue carvedilol  25 mg bid. - Continue Lasix  PRN - ICD interrogated personally. Volume ok. No VT/AF.  -Check BMET   2. H/o bilateral PE 1/20 - With GIB, on apixaban  2.5 mg bid, based on Amplify EXT trial - No bleeding issues.  - Check CBC   3. CAD  - S/P CABG 09/2018 - LIMA to the LAD and an SVG to the OM on 09/22/18 with Dr Luna Salinas - No chest pain.  - Continue statin and ? blocker - No ASA with DOAC use.  4. PVCs - Zio Patch 5/20 with 7.3% PVCs.   5. OSA - Home sleep study 7/20. AHI 7.2 with desat down to 85% - she continues to snore.  - She has been using a CPAP her sister gave her.  - Refer for GLP1  6. Mitral Regurgitation  - likely due to LV dysfunction/dilation. - Repeat Echo   7. Obesity Body mass index is 42.43 kg/m. Refer to GLP1 clinic for weight loss.  Discussed portion control  Check CBC BMET . Repeat Echo  Follow up in 2-3 months with Dr Jennifer Ny    Nieves Bars, NP  03/30/2024 9:01 AM  [facility] [address] [facility] [place] [address] [phone] (office) [phone] (fax)

## 2024-03-30 ENCOUNTER — Ambulatory Visit (INDEPENDENT_AMBULATORY_CARE_PROVIDER_SITE_OTHER): Payer: Self-pay

## 2024-03-30 ENCOUNTER — Ambulatory Visit (HOSPITAL_COMMUNITY): Payer: Self-pay | Admitting: Adult Health

## 2024-03-30 ENCOUNTER — Ambulatory Visit (HOSPITAL_COMMUNITY)
Admission: RE | Admit: 2024-03-30 | Discharge: 2024-03-30 | Disposition: A | Discharge status: Home / Self Care | Source: Ambulatory Visit | Attending: Adult Health | Admitting: Adult Health

## 2024-03-30 VITALS — BP 102/64 | HR 65 | Wt 239.5 lb

## 2024-03-30 DIAGNOSIS — G4733 Obstructive sleep apnea (adult) (pediatric): Secondary | ICD-10-CM | POA: Diagnosis not present

## 2024-03-30 DIAGNOSIS — E66813 Obesity, class 3: Secondary | ICD-10-CM

## 2024-03-30 DIAGNOSIS — I5022 Chronic systolic (congestive) heart failure: Secondary | ICD-10-CM | POA: Insufficient documentation

## 2024-03-30 DIAGNOSIS — Z6841 Body Mass Index (BMI) 40.0 and over, adult: Secondary | ICD-10-CM | POA: Diagnosis not present

## 2024-03-30 DIAGNOSIS — R0602 Shortness of breath: Secondary | ICD-10-CM | POA: Insufficient documentation

## 2024-03-30 DIAGNOSIS — I340 Nonrheumatic mitral (valve) insufficiency: Secondary | ICD-10-CM | POA: Insufficient documentation

## 2024-03-30 DIAGNOSIS — I2699 Other pulmonary embolism without acute cor pulmonale: Secondary | ICD-10-CM

## 2024-03-30 DIAGNOSIS — M797 Fibromyalgia: Secondary | ICD-10-CM | POA: Insufficient documentation

## 2024-03-30 DIAGNOSIS — I255 Ischemic cardiomyopathy: Secondary | ICD-10-CM | POA: Diagnosis not present

## 2024-03-30 DIAGNOSIS — Z86711 Personal history of pulmonary embolism: Secondary | ICD-10-CM | POA: Diagnosis not present

## 2024-03-30 DIAGNOSIS — Z79899 Other long term (current) drug therapy: Secondary | ICD-10-CM | POA: Diagnosis not present

## 2024-03-30 DIAGNOSIS — I110 Hypertensive heart disease with heart failure: Secondary | ICD-10-CM | POA: Insufficient documentation

## 2024-03-30 DIAGNOSIS — I2510 Atherosclerotic heart disease of native coronary artery without angina pectoris: Secondary | ICD-10-CM | POA: Insufficient documentation

## 2024-03-30 DIAGNOSIS — E669 Obesity, unspecified: Secondary | ICD-10-CM | POA: Insufficient documentation

## 2024-03-30 DIAGNOSIS — Z7901 Long term (current) use of anticoagulants: Secondary | ICD-10-CM | POA: Diagnosis not present

## 2024-03-30 DIAGNOSIS — Z87891 Personal history of nicotine dependence: Secondary | ICD-10-CM | POA: Diagnosis not present

## 2024-03-30 DIAGNOSIS — I493 Ventricular premature depolarization: Secondary | ICD-10-CM | POA: Diagnosis not present

## 2024-03-30 DIAGNOSIS — K449 Diaphragmatic hernia without obstruction or gangrene: Secondary | ICD-10-CM | POA: Insufficient documentation

## 2024-03-30 DIAGNOSIS — Z951 Presence of aortocoronary bypass graft: Secondary | ICD-10-CM | POA: Diagnosis not present

## 2024-03-30 DIAGNOSIS — Z7984 Long term (current) use of oral hypoglycemic drugs: Secondary | ICD-10-CM | POA: Diagnosis not present

## 2024-03-30 DIAGNOSIS — I428 Other cardiomyopathies: Secondary | ICD-10-CM | POA: Diagnosis not present

## 2024-03-30 LAB — CUP PACEART REMOTE DEVICE CHECK
Battery Remaining Longevity: 57 mo
Battery Remaining Percentage: 58 %
Battery Voltage: 2.96 V
Brady Statistic AP VP Percent: 1 %
Brady Statistic AP VS Percent: 1.5 %
Brady Statistic AS VP Percent: 1 %
Brady Statistic AS VS Percent: 97 %
Brady Statistic RA Percent Paced: 1 %
Brady Statistic RV Percent Paced: 1 %
Date Time Interrogation Session: 20250613020017
HighPow Impedance: 84 Ohm
HighPow Impedance: 84 Ohm
Implantable Lead Connection Status: 753985
Implantable Lead Connection Status: 753985
Implantable Lead Implant Date: 20201007
Implantable Lead Implant Date: 20201007
Implantable Lead Location: 753859
Implantable Lead Location: 753860
Implantable Lead Model: 7122
Implantable Pulse Generator Implant Date: 20201007
Lead Channel Impedance Value: 460 Ohm
Lead Channel Impedance Value: 730 Ohm
Lead Channel Pacing Threshold Amplitude: 0.75 V
Lead Channel Pacing Threshold Amplitude: 0.75 V
Lead Channel Pacing Threshold Pulse Width: 0.5 ms
Lead Channel Pacing Threshold Pulse Width: 0.5 ms
Lead Channel Sensing Intrinsic Amplitude: 12 mV
Lead Channel Sensing Intrinsic Amplitude: 3.4 mV
Lead Channel Setting Pacing Amplitude: 2 V
Lead Channel Setting Pacing Amplitude: 2.5 V
Lead Channel Setting Pacing Pulse Width: 0.5 ms
Lead Channel Setting Sensing Sensitivity: 0.5 mV
Pulse Gen Serial Number: 9901215
Zone Setting Status: 755011

## 2024-03-30 LAB — CBC
HCT: 46.1 % — ABNORMAL HIGH (ref 36.0–46.0)
Hemoglobin: 15.1 g/dL — ABNORMAL HIGH (ref 12.0–15.0)
MCH: 32.7 pg (ref 26.0–34.0)
MCHC: 32.8 g/dL (ref 30.0–36.0)
MCV: 99.8 fL (ref 80.0–100.0)
Platelets: 219 10*3/uL (ref 150–400)
RBC: 4.62 MIL/uL (ref 3.87–5.11)
RDW: 11.9 % (ref 11.5–15.5)
WBC: 6.1 10*3/uL (ref 4.0–10.5)
nRBC: 0 % (ref 0.0–0.2)

## 2024-03-30 LAB — BASIC METABOLIC PANEL WITH GFR
Anion gap: 9 (ref 5–15)
BUN: 13 mg/dL (ref 8–23)
CO2: 26 mmol/L (ref 22–32)
Calcium: 8.8 mg/dL — ABNORMAL LOW (ref 8.9–10.3)
Chloride: 104 mmol/L (ref 98–111)
Creatinine, Ser: 0.96 mg/dL (ref 0.44–1.00)
GFR, Estimated: >60 mL/min (ref >60)
Glucose, Bld: 102 mg/dL — ABNORMAL HIGH (ref 70–99)
Potassium: 4.4 mmol/L (ref 3.5–5.1)
Sodium: 139 mmol/L (ref 135–145)

## 2024-03-30 NOTE — Patient Instructions (Addendum)
 There has been no changes to your medications.  Labs done today, your results will be available in MyChart, we will contact you for abnormal readings.  Your physician has requested that you have an echocardiogram. Echocardiography is a painless test that uses sound waves to create images of your heart. It provides your doctor with information about the size and shape of your heart and how well your heart's chambers and valves are working. This procedure takes approximately one hour. There are no restrictions for this procedure. Please do NOT wear cologne, perfume, aftershave, or lotions (deodorant is allowed). Please arrive 15 minutes prior to your appointment time.  Please note: We ask at that you not bring children with you during ultrasound (echo/ vascular) testing. Due to room size and safety concerns, children are not allowed in the ultrasound rooms during exams. Our front office staff cannot provide observation of children in our lobby area while testing is being conducted. An adult accompanying a patient to their appointment will only be allowed in the ultrasound room at the discretion of the ultrasound technician under special circumstances. We apologize for any inconvenience.  You have been referred to the HEART CARE PHARMACY TEAM. They will call you to arrange your appointment.  Your physician recommends that you schedule a follow-up appointment in: 2-3 months.  If you have any questions or concerns before your next appointment please send us  a message through [place] or call our office at [phone].    TO LEAVE A MESSAGE FOR THE NURSE SELECT OPTION 2, PLEASE LEAVE A MESSAGE INCLUDING: YOUR NAME DATE OF BIRTH CALL BACK NUMBER REASON FOR CALL**this is important as we prioritize the call backs  YOU WILL RECEIVE A CALL BACK THE SAME DAY AS LONG AS YOU CALL BEFORE 4:00 PM  At the [facility], you and your health needs are our priority. As part of our continuing  mission to provide you with exceptional heart care, we have created designated Provider Care Teams. These Care Teams include your primary Cardiologist (physician) and Advanced Practice Providers (APPs- Physician Assistants and Nurse Practitioners) who all work together to provide you with the care you need, when you need it.   You may see any of the following providers on your designated Care Team at your next follow up: Dr Jules Oar Dr Peder Bourdon Dr. Alwin Baars Dr. Arta Lark Amy Marijane Shoulders, NP Ruddy Corral, [place] [facility] Pierre Part, [place] Dennise Fitz, NP [place] Lee, NP Shawnee Dellen, NP Luster Salters, PharmD Bevely Brush, PharmD   Please be sure to bring in all your medications bottles to every appointment.    Thank you for choosing [facility]

## 2024-04-05 ENCOUNTER — Ambulatory Visit: Payer: Self-pay | Admitting: Cardiology

## 2024-04-05 MED ORDER — OMEPRAZOLE 20 MG PO CPDR
20 mg | ORAL_CAPSULE | Freq: Every day | ORAL | 1 refills
Start: 2024-04-05 — End: ?

## 2024-04-06 ENCOUNTER — Other Ambulatory Visit: Payer: PRIVATE HEALTH INSURANCE

## 2024-04-06 ENCOUNTER — Telehealth: Payer: PRIVATE HEALTH INSURANCE

## 2024-04-06 MED ORDER — OMEPRAZOLE 20 MG PO CPDR
20 mg | ORAL_CAPSULE | Freq: Every day | ORAL | 1 refills
Start: 2024-04-06 — End: ?

## 2024-04-06 NOTE — Telephone Encounter
 Confirmation Documentation   Patient is scheduled on (DATE) for: 04/13/24 at 1:00 PM   [x]  Colonoscopy   []  Upper Endoscopy   []  Pouchoscopy   []  Upper Endoscopy w/Bravo   []  Upper Endoscopy w/ Esophageal Manometry   []  Upper Endoscopy w/ Esophageal Manometry & pH   []  Upper Endoscopy w/Endoflip   []  Sigmoidoscopy   []  Anoscopy   []  Illeoscopy   []  Small Bowel Enteroscopy   []  Esophageal Manometry   []  Anorectal Manometry   []  pH Study   []  Capsule Endoscopy    Informed patient of the following:   []  Arrival time   []  Location including suite number   []  Transportation   []  Mac Script   []  Does patient have prep instructions?   []  Informed patient to call back should there be any questions?   [x]  Does procedure order match the case scheduled    NOTE: If patients have any questions regarding prescribed medication patient needs to contact their prescribing physician to ensure it is ok to stop medications.

## 2024-04-10 ENCOUNTER — Telehealth: Payer: PRIVATE HEALTH INSURANCE

## 2024-04-10 ENCOUNTER — Other Ambulatory Visit: Payer: PRIVATE HEALTH INSURANCE

## 2024-04-10 NOTE — Telephone Encounter
 LM for patient to remind and confirm procedure scheduled for 04/13/2024  Check-in time: 12:00 pm   Procedure time:1 pm   Advised patient to take any blood pressure, heart, anti-seizurals and inhaler medications that morning of the procedure. Patient advised if using an inhaler to bring to the procedure. Advised patient they must have a ride home from the procedure with someone that can accompany them home. Medical procedure staff must be able to confirm ride when they arrive or the procedure will be cancelled.    Advised to call back with any questions or concerns.

## 2024-04-12 ENCOUNTER — Inpatient Hospital Stay: Payer: PRIVATE HEALTH INSURANCE

## 2024-04-12 DIAGNOSIS — Z1231 Encounter for screening mammogram for malignant neoplasm of breast: Secondary | ICD-10-CM

## 2024-04-13 MED ADMIN — PROPOFOL 200 MG/20ML IV EMUL: INTRAVENOUS | @ 20:00:00 | Stop: 2024-04-13 | NDC 63323026929

## 2024-04-13 MED ADMIN — PROPOFOL 200 MG/20ML IV EMUL (ANES): INTRAVENOUS | @ 20:00:00 | Stop: 2024-04-13

## 2024-04-13 MED ADMIN — SODIUM CHLORIDE 0.9 % IV SOLN: INTRAVENOUS | @ 20:00:00 | Stop: 2024-04-13 | NDC 00338004904

## 2024-04-13 MED ADMIN — LIDOCAINE HCL (PF) 1 % IJ SOLN: INTRAVENOUS | @ 20:00:00 | Stop: 2024-04-13 | NDC 00143959525

## 2024-04-13 NOTE — H&P
 UPDATED H&P REQUIREMENT    WHAT IS THE STATUS OF THE PATIENT'S MOST CURRENT HISTORY AND PHYSICAL?   - There is no recent H&P <30 days.  Proceed to H&P Notes section for new H&P.     REFER TO MEDICAL STAFF POLICIES REGARDING PRE-PROCEDURE HISTORY AND PHYSICAL EXAMINATION AND UPDATED H&P REQUIREMENTS BELOW:    Karle Ovens [facility] and [facility] and [facility] Medical Staff Policy 200 - For Patients Undergoing Procedures Requiring Moderate or Deep Sedation, General Anesthesia or Regional Anesthesia    Contents of a History and Physical Examination (H&P):    The H&P shall consist of chief complaint, history of present illness, allergies and medications, relevant social and family history, past medical history, review of systems and physical examination, and assessment and plan appropriate to the patient's age.    For Patients Undergoing Procedures Requiring Moderate or Deep Sedation, General Anesthesia or Regional Anesthesia:    1. An H&P shall be performed within 24 hours prior to the procedure by a qualified member of the medical staff or designee with appropriate privileges, except as noted in item 2 below.    2. If a complete history and physical was performed within thirty (30) calendar days prior to the patient?s admission to the Medical Center for elective surgery, a member of the medical staff assumes the responsibility for the accuracy of the clinical information and will need to document in the medical record within twenty-four (24) hours of admission and prior to surgery or major invasive procedure, that they either attest that the history and physical has been reviewed and accepted, or document an update of the original history and physical relevant to the patient's current clinical status.    3. Providing an H&P for patients undergoing surgery under local anesthesia is at the discretion of the Attending Physician.     4. When a procedure is performed by a dentist, podiatrist or other practitioner who is not privileged to perform an H&P, the anesthesiologist?s assessment immediately prior to the procedure will constitute the 24 hour re-assessment.The dentist, podiatrist or other practitioner who is not privileged to perform an H&P will document the history and physical relevant to the procedure.    5. If the H&P and the written informed consent for the surgery or procedure are not recorded in the patient's medical record prior to surgery, the operation shall not be performed unless the attending physician states in writing that such a delay could lead to an adverse event or irreversible damage to the patient.    6. The above requirements shall not preclude the rendering of emergency medical or surgical care to a patient in dire circumstances.

## 2024-04-13 NOTE — H&P
 [facility]  [facility] Procedure Unit          [] EGD  [x]  Colonoscopy  []  Sigmoidoscopy  []  Pouchoscopy      Indication for Procedure: Colon cancer screening, positive FIT  Prior colonoscopy: 2015  Family history of colon cancer: No      Past Medical History:   Diagnosis Date    End of life care 01/01/2019    ADVANCE CARE DIRECTIVE REVIEWED/ FORMS GIVEN WILL REVIEW AT NEXT VISIT     GERD (gastroesophageal reflux disease) Occasionally    Hyperlipidemia 11/28/2012    Insomnia     Seasonal allergies Decades ago    Controlled  with FloNase    Skin cancer 1 month ago    Will be removed next week    Sleep apnea Tested maybe 4 years sgo    Mild apnea       Past Surgical History:   Procedure Laterality Date    COLONOSCOPY  01/07/2012    Procedure: COLONOSCOPY;  Surgeon: Toribio Hoit, MD;  Location: [facility];  Service: Gastroenterology;  Laterality: N/A;  scope C#11    EYE SURGERY  Laser--partially detached retina    UPPER GASTROINTESTINAL ENDOSCOPY  01/07/2012    Procedure: UPPER GI ENDOSCOPY;  Surgeon: Toribio Hoit, MD;  Location: [facility];  Service: Gastroenterology;  Laterality: N/A;  Scope E# 6       Patient Active Problem List   Diagnosis    Hyperlipidemia    SK (seborrheic keratosis)    Colon cancer screening    Osteopenia    GERD (gastroesophageal reflux disease)    Positive FIT (fecal immunochemical test)       Family History   Problem Relation Age of Onset    Skin cancer Mother     Heart disease Mother         Closing heart valva    Heart disease Father         Clising heart valve    Stroke Father         Dementia presumably from strokes    Dementia Father     Arthritis Sister     Asthma Sister     Depression Sister     Diabetes Other         Family Hx    Mental illness Other         Family Hx    Asthma Other         Family Hx    Allergies Other         Family Hx    Colon cancer Neg Hx     Breast cancer Neg Hx     Uterine cancer Neg Hx     Ovarian cancer Neg Hx        Social History     Tobacco Use Smoking status: Never    Smokeless tobacco: Never   Vaping Use    Vaping status: Never Used   Substance Use Topics    Alcohol  use: Not Currently     Alcohol /week: 2.4 - 4.8 oz     Types: 4 - 8 Standard drinks or equivalent per week    Drug use: No         Level of sedation intended for procedure: moderate, deep, MAC  []  Moderate []  Deep [x]  MAC []  Anesthesia   Prior sedation problems []  Yes [x]  No  If yes explain  Teeth: [x]  Intact []  Loose []  Missing []  Dentures []  Kallie  [place] Visualized: [x]  Yes []  Partially []  not at all    Neck ROM: [x]   Normal []  Limited      Physical Exam:        Vitals Signs: BP 109/72  ~ Pulse 73  ~ Temp 36.4 ?C (97.5 ?F) (Forehead)  ~ Resp 14  ~ Ht 5' 7'' (1.702 m)  ~ Wt 138 lb (62.6 kg)  ~ SpO2 99%  ~ BMI 21.61 kg/m?     Normal Exam                                     Additional Findings  [x]  General  [x]  HEENT  [x]  Heart / CVS  [x]  Lungs / Respiratory  [x]  Abdomen      ASA Classification: per anesthesia  I have reviewed the history and physical and have determined Loa Pepper to be an appropriate candidate to undergo the planned procedure with sedation and analgesia.    Joeseph Verville G. Niharika Savino, MD  04/13/2024  1:00 PM    OK to discharge once discharge criteria is met.

## 2024-04-13 NOTE — Discharge Instructions
 PROCEDURE: []  Upper endoscopy (EGD)   [x]  Colonoscopy    []  Sigmoidoscopy      ACTIVITY:    [x]  Avoid any strenuous physical activity for 24 hours    [x]  No driving or operating heavy machinery for 24 hours    [x]  No alcoholic beverages for 24 hours (may interact with some medications you received during your procedure)    [x]  May use a heating pad on low temperature for any abdominal cramping.      DIET:    [x]  Resume your usual diet    []  Specific diet instructions:     Call Dr. Fidela Salisbury office [phone] for [facility] if you experience any of the following:      [x]  Severe chest pain, difficulty breathing    [x]  Passage of blood clots, black tarry stools, vomiting blood    [x]  Severe inflammation at the I.V. site    [x]  Difficulty swallowing or persistent vomiting    [x]  Abdominal pain    [x]  Chills or fever greater than 101 F or 38.4 C within 24 hours of procedure    FOLLOW-UP CARE:        []   Call Dr. Montez Hageman [phone]  for appointment as needed   []   Call Dr. Montez Hageman [phone]  for appointment in one month   []   Call your primary MD for appointment as needed   []  No Aspirin or NSAIDs for the next 7 days.   [x]  Ok to resume all home medications.

## 2024-04-13 NOTE — Procedures
 PATIENT NAME: Kerry Baxter, Kerry Baxter  DATE OF BIRTH: [birth year]  RECORD NUMBER: [id number]  DATE/TIME OF PROCEDURE: 04/13/2024 / 01:00 PM  ENDOSCOPIST: Mariel Bo, MD  REFERRING PHYSICIAN:  FELLOW:    INDICATIONS FOR EXAMINATION: positive FIT  Screening Colonoscopy.  PROCEDURE PERFORMED: COLONOSCOPY - screening, high risk    MEDICATIONS: MAC Anesthesia    PROCEDURE TECHNIQUE: Patient's medications, allergies, past medical, surgical, social and family histories were reviewed and updated as appropriate. A discussion of informed consent was had with the patient and/or the patient's family prior to the   procedure, including sedation. The alternatives, benefits and risks of the procedure including but not limited to perforation, hemorrhage, infection, adverse drug reaction and aspiration were discussed    Description of Procedure:  After discussion of informed consent, and appropriate level of sedation were attained, the patient was placed in the left lateral position. The patient was monitored continuously with pulse oximetry, blood pressure monitoring, and direct observations.    After digital rectal examination, the colonoscope PCF-H190DL [id number] was inserted into the rectum and advanced under direct vision to the level of the cecum. The quality of the colonic preparation was Good. A careful inspection was made as the   colonoscope was withdrawn. Findings and interventions are described below.?  TOTAL WITHDRAWAL TIME: 00:14?  TOTAL INSERTION TIME: 00:18    EXTENT OF EXAM: cecum      INSTRUMENTS: PCF-H190DL #[id number]  TECHNICAL DIFFICULTY: No  LIMITATIONS: None.  TOLERANCE: Good  VISUALIZATION: Good    FINDINGS: Multiple small diverticula in the sigmoid colon.  Small external hemorrhoids on retroflexion.    ESTIMATED BLOOD LOSS: None    DIAGNOSIS: Multiple small diverticula in the sigmoid colon.  Small external hemorrhoids on retroflexion.    RECOMMENDATIONS: Repeat colonoscopy in 10 years (or sooner if clinically indicated).    CPT CODE:  54621 Colonoscopy, flexible; diagnostic, including collection of specimen(s) by brushing or washing, when performed (separate procedur  G0105 Colorectal cancer screening: Colonoscopy on individual at high risk          This electronic signature authenticates all electronic and/or handwritten documentation, including orders, generated by the signer during the episode of care contained in this record.04/13/2024 01:33 PM by Mariel Bo, MD

## 2024-04-30 ENCOUNTER — Ambulatory Visit (HOSPITAL_COMMUNITY)
Admission: RE | Admit: 2024-04-30 | Discharge: 2024-04-30 | Disposition: A | Discharge status: Home / Self Care | Source: Ambulatory Visit | Attending: Adult Health | Admitting: Adult Health

## 2024-04-30 DIAGNOSIS — R0602 Shortness of breath: Secondary | ICD-10-CM | POA: Insufficient documentation

## 2024-04-30 DIAGNOSIS — E785 Hyperlipidemia, unspecified: Secondary | ICD-10-CM | POA: Insufficient documentation

## 2024-04-30 DIAGNOSIS — Z951 Presence of aortocoronary bypass graft: Secondary | ICD-10-CM | POA: Insufficient documentation

## 2024-04-30 DIAGNOSIS — I081 Rheumatic disorders of both mitral and tricuspid valves: Secondary | ICD-10-CM | POA: Diagnosis not present

## 2024-04-30 DIAGNOSIS — I5022 Chronic systolic (congestive) heart failure: Secondary | ICD-10-CM | POA: Insufficient documentation

## 2024-04-30 DIAGNOSIS — I429 Cardiomyopathy, unspecified: Secondary | ICD-10-CM | POA: Insufficient documentation

## 2024-04-30 LAB — ECHOCARDIOGRAM COMPLETE
AR max vel: 2.3 cm2
AV Area VTI: 1.89 cm2
AV Area mean vel: 2.16 cm2
AV Mean grad: 6 mmHg
AV Peak grad: 10.9 mmHg
Ao pk vel: 1.65 m/s
Area-P 1/2: 4.49 cm2
Calc EF: 29.2 %
S' Lateral: 6.2 cm
Single Plane A2C EF: 30.3 %
Single Plane A4C EF: 19.1 %

## 2024-04-30 NOTE — Progress Notes (Signed)
 Echocardiogram 2D Echocardiogram has been performed.  [name] 04/30/2024, 9:56 AM

## 2024-05-04 MED ORDER — OMEPRAZOLE 20 MG PO CPDR
20 mg | ORAL_CAPSULE | Freq: Every day | ORAL | 1 refills
Start: 2024-05-04 — End: ?

## 2024-05-07 MED ORDER — OMEPRAZOLE 20 MG PO CPDR
20 mg | ORAL_CAPSULE | Freq: Every day | ORAL | 3 refills | 90.00000 days | Status: AC
Start: 2024-05-07 — End: ?

## 2024-05-15 ENCOUNTER — Other Ambulatory Visit (HOSPITAL_COMMUNITY): Payer: Self-pay

## 2024-05-15 ENCOUNTER — Telehealth: Payer: Self-pay

## 2024-05-15 ENCOUNTER — Telehealth: Payer: Self-pay | Admitting: Pharmacist

## 2024-05-15 NOTE — Telephone Encounter (Signed)
 Pharmacy Patient Advocate Encounter   Insurance verification completed.   The patient is insured through [employer] test claim for [id number] . Currently a quantity of 2 ML is a 28 day supply and the co-pay is $0 . The current 28 day co-pay is, $0.  No PA needed at this time.   This test claim was processed through [facility]- copay amounts may vary at other pharmacies due to pharmacy/plan contracts, or as the patient moves through the different stages of their insurance plan.

## 2024-05-15 NOTE — Progress Notes (Unsigned)
 Patient ID: SHUNTIA EXTON                 DOB: 1956-04-07                    MRN: [id number]     HPI: Jennifer [name] is a 68 y.o. female patient referred to pharmacy clinic by Greig Mosses, NP to initiate GLP1-RA therapy. PMH is significant for hx of MI  ICM,HTN,CHF,hx of PE, CAD, S/p CABG,duodenal ulcer with hemorrhage,  , and obesity. Most recent BMI 40.39 kg/m .  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 But SOB from HF limits her activity   Family History:  Relation Problem Comments  Mother (Deceased) Diabetes Mellitus II   Heart disease (Age: 30)     Father (Deceased) Alcohol  abuse   Colon cancer 22  Depression     Brother (Deceased) Congestive Heart Failure (Age: 75) probably dilated cardiomyopathy after PNA     Social History:  Alcohol : none  Smoking: none  Labs: Lab Results  Component Value Date   HGBA1C 5.0 09/22/2018    Wt Readings from Last 1 Encounters:  03/30/24 239 lb 8 oz (108.6 kg)    BP Readings from Last 1 Encounters:  03/30/24 102/64   Pulse Readings from Last 1 Encounters:  03/30/24 65       Component Value Date/Time   CHOL 126 07/29/2021 1146   TRIG 105 07/29/2021 1146   HDL 49 07/29/2021 1146   CHOLHDL 2.6 07/29/2021 1146   VLDL 21 07/29/2021 1146   LDLCALC 56 07/29/2021 1146    Past Medical History:  Diagnosis Date   Arthritis    probably; maybe in my hands (09/21/2018)   Congestive heart failure (CHF) (HCC)    Depression    Dyspnea    Fibromyalgia    GERD (gastroesophageal reflux disease)    Hypercholesteremia    Hypertension    IBS (irritable bowel syndrome)    Mild sleep apnea    no CPAP (09/21/2018)   Myocardial infarction [facility])     was told I have had one; never knew about it (09/21/2018)   Nonischemic cardiomyopathy (HCC)    ECHO EF 40-45%, Septal Thickness,and mild global left ventricular dysfunction; stress test 03/2004 +VE, refersible defect; ECHO 03/2005 @[facility]  EF 55%   Pulmonary embolus (HCC)     Current Outpatient Medications on File Prior to Visit  Medication Sig Dispense Refill   albuterol (VENTOLIN HFA) 108 (90 Base) MCG/ACT inhaler Inhale 1-2 puffs into the lungs every 6 (six) hours as needed for shortness of breath or wheezing.     apixaban  (ELIQUIS ) 2.5 MG TABS tablet Take 1 tablet (2.5 mg total) by mouth 2 (two) times daily. 60 tablet 11   Ascorbic Acid  (VITAMIN C ) 1000 MG tablet Take 1,000 mg by mouth daily.     atorvastatin  (LIPITOR ) 80 MG tablet Take 1 tablet (80 mg total) by mouth daily at 6 PM. 30 tablet 1   calamine lotion Apply 1 application topically as needed for itching.      carvedilol  (COREG ) 25 MG tablet TAKE 1 TABLET (25 MG TOTAL) BY MOUTH  2 (TWO) TIMES DAILY. KEEP UPCOMING APPT. 60 tablet 0   cholecalciferol  (VITAMIN D3) 25 MCG (1000 UT) tablet Take 1,000 Units by mouth daily.     dapagliflozin  propanediol (FARXIGA ) 10 MG TABS tablet Take 1 tablet (10 mg total) by mouth daily before breakfast. 90 tablet 3   DULoxetine  (CYMBALTA ) 60 MG capsule Take 60 mg by mouth 2 (two) times daily.     furosemide  (LASIX ) 40 MG tablet Take 40 mg by mouth daily as needed for edema.     gabapentin  (NEURONTIN ) 300 MG capsule Take 600 mg by mouth 3 (three) times daily.     pantoprazole  (PROTONIX ) 40 MG tablet TAKE 1 TABLET (40 MG TOTAL) BY MOUTH TWICE A DAY BEFORE MEALS 180 tablet 0   Phenylephrine -APAP-guaiFENesin (MUCINEX SINUS-MAX PO) Take 2 tablets by mouth daily as needed (sinus headaches).     polyvinyl alcohol  (LIQUIFILM TEARS) 1.4 % ophthalmic solution Place 1 drop into both eyes as needed for dry eyes.     pramipexole  (MIRAPEX ) 0.5 MG tablet Take 0.5 mg by mouth at bedtime.      sacubitril -valsartan   (ENTRESTO ) 97-103 MG Take 1 tablet by mouth 2 (two) times daily. 180 tablet 3   spironolactone  (ALDACTONE ) 25 MG tablet TAKE 1 TABLET EVERY DAY 90 tablet 3   vitamin B-12 (CYANOCOBALAMIN ) 500 MCG tablet Take 1 tablet (500 mcg total) by mouth daily. 30 tablet 0   No current facility-administered medications on file prior to visit.    Allergies  Allergen Reactions   Poison Oak Extract Rash     Assessment/Plan:  1. Weight loss - Patient has not met goal of at least 5% of body weight loss with comprehensive lifestyle modifications alone in the past 3-6 months. Pharmacotherapy is appropriate to pursue as augmentation. Will start Wegovy   . Confirmed patient has no personal or family history of medullary thyroid  carcinoma (MTC) or Multiple Endocrine Neoplasia syndrome type 2 (MEN 2). Injection technique reviewed at today's visit.  Advised patient on common side effects including nausea, diarrhea, dyspepsia, decreased appetite, and fatigue. Counseled patient on reducing meal size and how to titrate medication to minimize side effects. Counseled patient to call if intolerable side effects or if experiencing dehydration, abdominal pain, or dizziness. Along with pharmacotherapy, the patient will follow dietary modifications and aim for at least 150 minutes of moderate-intensity exercise per week, plus resistance training twice a week (as recommended by the American Heart Association). This resistance training--such as weightlifting, bodyweight exercises, or using resistance bands, adapted to the patient's ability--will help prevent muscle loss. Phone follow-ups will be conducted every 4 weeks for dose titration until the patient reaches the effective therapeutic dose and target weight.  Robbi Blanch, Pharm.D [facility]. [facility] [address], [place], [place] [address] Phone: [phone]; Fax: [phone]

## 2024-05-16 ENCOUNTER — Encounter: Payer: Self-pay | Admitting: Pharmacist

## 2024-05-16 ENCOUNTER — Other Ambulatory Visit (HOSPITAL_COMMUNITY): Payer: Self-pay

## 2024-05-16 ENCOUNTER — Ambulatory Visit: Attending: Cardiology | Admitting: Pharmacist

## 2024-05-16 VITALS — Ht 63.0 in | Wt 239.0 lb

## 2024-05-16 DIAGNOSIS — E66813 Obesity, class 3: Secondary | ICD-10-CM | POA: Diagnosis not present

## 2024-05-16 DIAGNOSIS — Z6841 Body Mass Index (BMI) 40.0 and over, adult: Secondary | ICD-10-CM

## 2024-05-16 MED ORDER — WEGOVY 0.25 MG/0.5ML ~~LOC~~ SOAJ
0.2500 mg | SUBCUTANEOUS | 0 refills | Status: DC
  Filled 2024-05-16: qty 2, 28d supply, fill #0

## 2024-05-16 NOTE — Patient Instructions (Signed)
GLP1 Agonist Titration Plan:  Will plan to follow the titration plan as below, pending patient is tolerating each dose before increasing to the next. Can slow titration if needed for tolerability.   Wegovy:  -Month 1: Inject Wegovy 0.25 mg once weekly x 4 weeks -Month 2: Inject Wegovy 0.5 mg once weekly x 4 weeks -Month 3: Inject Wegovy 1 mg once weekly x 4 weeks -Month 4: Inject Wegovy 1.7mg SQ once weekly x 4 weeks -Month 5+: Inject Wegovy 2.4mg SQ once weekly     

## 2024-05-17 NOTE — Progress Notes (Signed)
 Remote ICD transmission.

## 2024-06-29 ENCOUNTER — Ambulatory Visit (INDEPENDENT_AMBULATORY_CARE_PROVIDER_SITE_OTHER): Payer: Self-pay

## 2024-06-29 DIAGNOSIS — I5022 Chronic systolic (congestive) heart failure: Secondary | ICD-10-CM | POA: Diagnosis not present

## 2024-06-29 LAB — CUP PACEART REMOTE DEVICE CHECK
Battery Remaining Longevity: 55 mo
Battery Remaining Percentage: 56 %
Battery Voltage: 2.95 V
Brady Statistic AP VP Percent: 1 %
Brady Statistic AP VS Percent: 1.7 %
Brady Statistic AS VP Percent: 1 %
Brady Statistic AS VS Percent: 96 %
Brady Statistic RA Percent Paced: 1 %
Brady Statistic RV Percent Paced: 1 %
Date Time Interrogation Session: 20250912020017
HighPow Impedance: 87 Ohm
HighPow Impedance: 87 Ohm
Implantable Lead Connection Status: 753985
Implantable Lead Connection Status: 753985
Implantable Lead Implant Date: 20201007
Implantable Lead Implant Date: 20201007
Implantable Lead Location: 753859
Implantable Lead Location: 753860
Implantable Lead Model: 7122
Implantable Pulse Generator Implant Date: 20201007
Lead Channel Impedance Value: 510 Ohm
Lead Channel Impedance Value: 780 Ohm
Lead Channel Pacing Threshold Amplitude: 0.75 V
Lead Channel Pacing Threshold Amplitude: 0.75 V
Lead Channel Pacing Threshold Pulse Width: 0.5 ms
Lead Channel Pacing Threshold Pulse Width: 0.5 ms
Lead Channel Sensing Intrinsic Amplitude: 12 mV
Lead Channel Sensing Intrinsic Amplitude: 3.7 mV
Lead Channel Setting Pacing Amplitude: 2 V
Lead Channel Setting Pacing Amplitude: 2.5 V
Lead Channel Setting Pacing Pulse Width: 0.5 ms
Lead Channel Setting Sensing Sensitivity: 0.5 mV
Pulse Gen Serial Number: 9901215
Zone Setting Status: 755011

## 2024-07-01 NOTE — Progress Notes (Signed)
 Advanced [facility] Clinic Note  Date:  07/02/2024   ID:  Jennifer [name], DOB [birth year], MRN [id number]  Location: Home  Provider location: [place] [facility] Type of Visit: Established patient   PCP:  Maree Isles, MD  Cardiologist:  Pearla Rout, MD (Inactive) HF Cardiologist: Dr Cherrie  EP: Dr Inocencio  Chief complaint: HF  HPI: Jennifer [name] is a 68 y.o.SABRA female with a history of systolic HF due to mixed ICM/NICM, DVT, PE, fibromyalgia, GI bleed, and CAD CABG.   Long h/o reported mild NICM EF 40-45%. Echo 11/19 EF 15-20%  Catheterization 09/21/18 revealed 90% proximal LAD and a 50% circumflex stenosis.  She underwent bypass grafting x2 with an LIMA to the LAD and an SVG to the OM on 09/22/18 with Dr Kerrin.  Post CABG EF depressed.   Zio Patch 5/20 7.3 % PVCs 1. Predominant underlying rhythm was Sinus Rhythm. Minimum HR of 56 bpm, max HR of 176 bpm, and avg HR of 77 bpm. 2. Three runs of NSVT - longest 10 beats 3. Seven runs of SVT - longest lasting 9 beats   Started carvedilol  03/13/2019    Underwent SJ ICD 07/25/19   Had GI bleed in in 8/22 in [place] . EGD showed 1 oozing cratered duodenal ulcer with visible vessel in the duodenal bulb and in the first part portion of the duodenum. Lesion was 9 mm in largest dimension. 3 hemostatic clips were successfully placed to stop the bleeding. Additionally 4 nonbleeding cratered gastric ulcers with no stigmata of bleeding were found in the gastric antrum. A small hiatal hernia was also appreciated. No further bleeding. Back on Xarelto .   Echo 7/25 EF 25-30% RV mildly down  Personally reviewed  Today she returns for HF follow up.Feels pretty good. Feels like she's slowing down a bit. 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 No edema, orthopnea or PND. Noproblem with meds. Jennifer [name] a lot with her sister. Works PT taking care of a person with [employer]  Cardiac Studies:   - CPX 07/09/19  FVC 2.09  (73%)      FEV1 1.47 (65%)        FEV1/FVC 70 (88%)        MVV 70 (81%)       Resting HR: 52 Standing HR:48 Peak HR: 102   (65% age predicted max HR)  BP rest: 84/60 Standing BP:90/60 BP peak: 124/60  Peak VO2: 16.0 (89% predicted peak VO2)  VE/VCO2 slope:  29  OUES: 1.81  Peak RER: 1.05  VE/MVV:  67%   cMRI 12/05/18 1. Moderately dilated LV with EF 21%, diffuse hypokinesis.  2. Normal RV size with moderately decreased systolic function, EF 31%. 3. Moderate TR, at least moderate central MR (likely functional). 4. No myocardial LGE, so no evidence for prior infarct, infiltrative disease, or myocarditis.  - RHC 12/05/18 On milrinone  0.125 RA = 8 RV = 37/10 PA = 39/15 (25) PCW = 19 (v = 35) Fick cardiac output/index = 5.0/2.8 PVR = 1.1 WU Ao sat = 94% PA sat = 66%, 66% PaPi = 3.0 RA/PCW = 0.42  Past Medical History:  Diagnosis Date   Arthritis    probably; maybe in my hands (09/21/2018)   Congestive heart failure (CHF) (HCC)    Depression    Dyspnea    Fibromyalgia    GERD (gastroesophageal reflux disease)    Hypercholesteremia    Hypertension    IBS (irritable bowel syndrome)  Mild sleep apnea    no CPAP (09/21/2018)   Myocardial infarction [facility])    was told I have had one; never knew about it (09/21/2018)   Nonischemic cardiomyopathy (HCC)    ECHO EF 40-45%, Septal Thickness,and mild global left ventricular dysfunction; stress test 03/2004 +VE, refersible defect; ECHO 03/2005 @[facility]  EF 55%   Pulmonary embolus [facility])    Past Surgical History:  Procedure Laterality Date   ANKLE FRACTURE SURGERY Left 2000   BIOPSY  09/17/2021   Procedure: BIOPSY;  Surgeon: Shaaron Lamar HERO, MD;  Location: [facility];  Service: Endoscopy;;   BUNIONECTOMY Right 1990s?   & removed part of my great toe due to fungus under nail   CARDIAC CATHETERIZATION  03/2005   CATARACT EXTRACTION, BILATERAL Bilateral    COLONOSCOPY  07/2011   Dr. Donnel at [facility]:Dzddpoz polyp found in the  distal transverse colon, approximately 1 cm in size, removed.  Mild diverticulosis.  Internal hemorrhoids.  Path report available.   COLONOSCOPY WITH PROPOFOL  N/A 10/06/2022   Procedure: COLONOSCOPY WITH PROPOFOL ;  Surgeon: Cindie Carlin POUR, DO;  Location: [facility];  Service: Endoscopy;  Laterality: N/A;  9:15 am   CORONARY ARTERY BYPASS GRAFT N/A 09/22/2018   Procedure: CORONARY ARTERY BYPASS GRAFTING (CABG), ON PUMP, TIMES TWO, USING LEFT INTERNAL MAMMARY ARTERY AND ENDOSCOPICALLY HARVESTED RIGHT GREATER SAPHENOUS VEIN;  Surgeon: Kerrin Elspeth BROCKS, MD;  Location: [facility] OR;  Service: Open Heart Surgery;  Laterality: N/A;   DILATION AND CURETTAGE OF UTERUS     ESOPHAGOGASTRODUODENOSCOPY (EGD) WITH PROPOFOL  N/A 09/17/2021   Procedure: ESOPHAGOGASTRODUODENOSCOPY (EGD) WITH PROPOFOL ;  Surgeon: Shaaron Lamar HERO, MD;  Location[facility];  Service: Endoscopy;  Laterality: N/A;  8:30am   FRACTURE SURGERY     ICD IMPLANT N/A 07/25/2019   Procedure: ICD IMPLANT;  Surgeon: Inocencio Soyla Lunger, MD;  Location: [facility] INVASIVE CV LAB;  Service: Cardiovascular;  Laterality: N/A;   POLYPECTOMY  10/06/2022   Procedure: POLYPECTOMY;  Surgeon: Cindie Carlin POUR, DO;  Location: [facility];  Service: Endoscopy;;   RIGHT HEART CATH N/A 12/05/2018   Procedure: RIGHT HEART CATH;  Surgeon: Cherrie Toribio SAUNDERS, MD;  Location: [facility] INVASIVE CV LAB;  Service: Cardiovascular;  Laterality: N/A;   RIGHT/LEFT HEART CATH AND CORONARY ANGIOGRAPHY N/A 09/21/2018   Procedure: RIGHT/LEFT HEART CATH AND CORONARY ANGIOGRAPHY;  Surgeon: Burnard Debby LABOR, MD;  Location: [facility] INVASIVE CV LAB;  Service: Cardiovascular;  Laterality: N/A;   TEE WITHOUT CARDIOVERSION N/A 09/22/2018   Procedure: TRANSESOPHAGEAL ECHOCARDIOGRAM (TEE);  Surgeon: Kerrin Elspeth BROCKS, MD;  Location: [facility] OR;  Service: Open Heart Surgery;  Laterality: N/A;   TUBAL LIGATION     Current Outpatient Medications  Medication Sig Dispense Refill   albuterol  (VENTOLIN HFA) 108 (90 Base) MCG/ACT inhaler Inhale 1-2 puffs into the lungs every 6 (six) hours as needed for shortness of breath or wheezing.     apixaban  (ELIQUIS ) 2.5 MG TABS tablet Take 1 tablet (2.5 mg total) by mouth 2 (two) times daily. 60 tablet 11   Ascorbic Acid  (VITAMIN C ) 1000 MG tablet Take 1,000 mg by mouth daily.     atorvastatin  (LIPITOR ) 80 MG tablet Take 1 tablet (80 mg total) by mouth daily at 6 PM. 30 tablet 1   calamine lotion Apply 1 application topically as needed for itching.      carvedilol  (COREG ) 25 MG tablet TAKE 1 TABLET (25 MG TOTAL) BY MOUTH 2 (TWO) TIMES DAILY. KEEP UPCOMING APPT. 60 tablet  0   cholecalciferol  (VITAMIN D3) 25 MCG (1000 UT) tablet Take 1,000 Units by mouth daily.     dapagliflozin  propanediol (FARXIGA ) 10 MG TABS tablet Take 1 tablet (10 mg total) by mouth daily before breakfast. 90 tablet 3   DULoxetine  (CYMBALTA ) 60 MG capsule Take 60 mg by mouth 2 (two) times daily.     furosemide  (LASIX ) 40 MG tablet Take 40 mg by mouth daily as needed for edema.     gabapentin  (NEURONTIN ) 300 MG capsule Take 600 mg by mouth 3 (three) times daily.     pantoprazole  (PROTONIX ) 40 MG tablet TAKE 1 TABLET (40 MG TOTAL) BY MOUTH TWICE A DAY BEFORE MEALS 180 tablet 0   Phenylephrine -APAP-guaiFENesin (MUCINEX SINUS-MAX PO) Take 2 tablets by mouth daily as needed (sinus headaches).     polyvinyl alcohol  (LIQUIFILM TEARS) 1.4 % ophthalmic solution Place 1 drop into both eyes as needed for dry eyes.     pramipexole  (MIRAPEX ) 0.5 MG tablet Take 0.5 mg by mouth at bedtime.      sacubitril -valsartan  (ENTRESTO ) 97-103 MG Take 1 tablet by mouth 2 (two) times daily. 180 tablet 3   Semaglutide -Weight Management (WEGOVY ) 0.25 MG/0.5ML SOAJ Inject 0.25 mg into the skin once a week. 2 mL 0   spironolactone  (ALDACTONE ) 25 MG tablet TAKE 1 TABLET EVERY DAY 90 tablet 3   vitamin B-12 (CYANOCOBALAMIN ) 500 MCG tablet Take 1 tablet (500 mcg total) by mouth daily. 30 tablet 0   No  current facility-administered medications for this encounter.    Allergies:   Poison oak extract   Social History:  The patient  reports that she quit smoking about 33 years ago. Her smoking use included cigarettes. She started smoking about 63 years ago. She has a 30 pack-year smoking history. She has never used smokeless tobacco. She reports that she does not currently use alcohol . She reports that she does not use drugs.   Family History:  The patient's family history includes Alcohol  abuse in her father; Colon cancer in her father; Congestive Heart Failure (age of onset: 32) in her brother; Depression in her father; Diabetes Mellitus II in her mother; Heart disease (age of onset: 41) in her mother.   ROS:  Please see the history of present illness.   All other systems are personally reviewed and negative.  Vitals:   07/02/24 0855  BP: 110/70  Pulse: 75  SpO2: 95%     Recent Labs: 03/30/2024: BUN 13; Creatinine, Ser 0.96; Hemoglobin 15.1; Platelets 219; Potassium 4.4; Sodium 139  Personally reviewed   BP 110/70   Pulse 75   Wt 104.8 kg (231 lb)   SpO2 95%   BMI 40.92 kg/m   Wt Readings from Last 3 Encounters:  07/02/24 104.8 kg (231 lb)  05/16/24 108.4 kg (239 lb)  03/30/24 108.6 kg (239 lb 8 oz)    Physical Exam General:   No resp difficulty Neck: supple. no JVD.  Cor: PMI nondisplaced. Regular rate & rhythm. No rubs, gallops or murmurs. Lungs: clear Abdomen:obese soft, nontender, nondistended.  Extremities: no cyanosis, clubbing, rash, edema Neuro: alert & oriented x3  ICD interrogation: No VT/AF. Fluid ok Personally reviewed   ASSESSMENT AND PLAN:  1. Biventricular Chronic Systolic HF  Suspect mixed ICM/NICM (?PVCs) - LHC 2019 90% LAD and 50% circumflex --> S/P CABG x2 LIMA to LAD and SVG to OM  - Echo (2/20: EF 10-15% moderate RV dysfunction.  - cMRI (2/20):  LVEF 21% Moderately reduced RV, moderateTR moderate  to severe MR. No LGE - Echo (5/20): EF 20-25% RV  severerly reduced - Zio Patch 5/20 with 7.3% PVCs.  - Echo (9/20): EF 20-25% severe MR - CPX test (9/20) with preserved functional capacity but low HR. Off ivabradine   - Echo (3/21): EF 20-25%, RV normal, mild MR   - Echo (10/21): EF 25-30% RV normal mild MR.  - Echo 7/25 EF 25-30% RV mildly down  Mild MR Personally reviewed - s/p ST Jude ICD (10/20) - Stable NYHA II- III - Continue Entresto  97/103 mg bid. - Continue spironolactone  25 mg daily.  - Continue Farxiga  10 mg daily. - Continue carvedilol  25 mg bid. - Continue Lasix  PRN - ICD interrogated personally. Volume ok. No VT/AF - Bloodwork 6.13.25 reviewed Scr 0.9 K 4.4  2. H/o bilateral PE 1/20 - With GIB, on apixaban  2.5 mg bid, based on Amplify EXT trial -No blleding  3. CAD  - S/P CABG 09/2018 - LIMA to the LAD and an SVG to the OM on 09/22/18 with Dr Kerrin - No s/s angina - Continue statin and ? blocker - No ASA with DOAC use.  4. PVCs - Zio Patch 5/20 with 7.3% PVCs.   5. OSA - Home sleep study 7/20. AHI 7.2 with desat down to 85%  6. Obesity - Body mass index is 40.92 kg/m. - Continue Wegovy    Toribio Fuel, MD  07/02/2024 9:24 AM  [facility] [address] [facility] [place] [address] [phone] (office) [phone] (fax)

## 2024-07-02 ENCOUNTER — Ambulatory Visit (HOSPITAL_COMMUNITY)
Admission: RE | Admit: 2024-07-02 | Discharge: 2024-07-02 | Disposition: A | Discharge status: Home / Self Care | Source: Ambulatory Visit | Attending: Internal Medicine | Admitting: Internal Medicine

## 2024-07-02 VITALS — BP 110/70 | HR 75 | Wt 231.0 lb

## 2024-07-02 DIAGNOSIS — I2510 Atherosclerotic heart disease of native coronary artery without angina pectoris: Secondary | ICD-10-CM | POA: Diagnosis not present

## 2024-07-02 DIAGNOSIS — Z87891 Personal history of nicotine dependence: Secondary | ICD-10-CM | POA: Diagnosis not present

## 2024-07-02 DIAGNOSIS — I5022 Chronic systolic (congestive) heart failure: Secondary | ICD-10-CM | POA: Diagnosis not present

## 2024-07-02 DIAGNOSIS — I110 Hypertensive heart disease with heart failure: Secondary | ICD-10-CM | POA: Insufficient documentation

## 2024-07-02 DIAGNOSIS — Z6841 Body Mass Index (BMI) 40.0 and over, adult: Secondary | ICD-10-CM | POA: Insufficient documentation

## 2024-07-02 DIAGNOSIS — Z7901 Long term (current) use of anticoagulants: Secondary | ICD-10-CM | POA: Diagnosis not present

## 2024-07-02 DIAGNOSIS — M797 Fibromyalgia: Secondary | ICD-10-CM | POA: Diagnosis not present

## 2024-07-02 DIAGNOSIS — I493 Ventricular premature depolarization: Secondary | ICD-10-CM | POA: Diagnosis not present

## 2024-07-02 DIAGNOSIS — I428 Other cardiomyopathies: Secondary | ICD-10-CM | POA: Diagnosis not present

## 2024-07-02 DIAGNOSIS — Z95810 Presence of automatic (implantable) cardiac defibrillator: Secondary | ICD-10-CM | POA: Diagnosis not present

## 2024-07-02 DIAGNOSIS — Z8249 Family history of ischemic heart disease and other diseases of the circulatory system: Secondary | ICD-10-CM | POA: Diagnosis not present

## 2024-07-02 DIAGNOSIS — Z86718 Personal history of other venous thrombosis and embolism: Secondary | ICD-10-CM | POA: Insufficient documentation

## 2024-07-02 DIAGNOSIS — Z86711 Personal history of pulmonary embolism: Secondary | ICD-10-CM | POA: Insufficient documentation

## 2024-07-02 DIAGNOSIS — G4733 Obstructive sleep apnea (adult) (pediatric): Secondary | ICD-10-CM | POA: Insufficient documentation

## 2024-07-02 DIAGNOSIS — E669 Obesity, unspecified: Secondary | ICD-10-CM | POA: Insufficient documentation

## 2024-07-02 DIAGNOSIS — Z951 Presence of aortocoronary bypass graft: Secondary | ICD-10-CM | POA: Diagnosis not present

## 2024-07-02 DIAGNOSIS — Z8719 Personal history of other diseases of the digestive system: Secondary | ICD-10-CM | POA: Diagnosis not present

## 2024-07-02 DIAGNOSIS — Z79899 Other long term (current) drug therapy: Secondary | ICD-10-CM | POA: Insufficient documentation

## 2024-07-02 NOTE — Addendum Note (Signed)
 Encounter addended by: Buell Powell HERO, RN on: 07/02/2024 9:37 AM  Actions taken: Clinical Note Signed

## 2024-07-02 NOTE — Patient Instructions (Signed)
 Great to see you today!!!  No changes, continue current medications  Your physician recommends that you schedule a follow-up appointment in: 1 year (Sept 2026), **PLEASE CALL OUR OFFICE IN JULY TO SCHEDULE THIS APPOINTMENT  If you have any questions or concerns before your next appointment please send us  a message through [place] or call our office at [phone].    TO LEAVE A MESSAGE FOR THE NURSE SELECT OPTION 2, PLEASE LEAVE A MESSAGE INCLUDING: YOUR NAME DATE OF BIRTH CALL BACK NUMBER REASON FOR CALL**this is important as we prioritize the call backs  YOU WILL RECEIVE A CALL BACK THE SAME DAY AS LONG AS YOU CALL BEFORE 4:00 PM   At the [facility], you and your health needs are our priority. As part of our continuing mission to provide you with exceptional heart care, we have created designated Provider Care Teams. These Care Teams include your primary Cardiologist (physician) and Advanced Practice Providers (APPs- Physician Assistants and Nurse Practitioners) who all work together to provide you with the care you need, when you need it.   You may see any of the following providers on your designated Care Team at your next follow up: Dr Toribio Fuel Dr Ezra Shuck Dr. Ria Commander Dr. Morene Brownie Amy Lenetta, NP Caffie Shed, [place] [facility] [place], [place] Beckey Coe, NP [place] Lee, NP Ellouise Class, NP Tinnie Redman, PharmD Jaun Bash, PharmD   Please be sure to bring in all your medications bottles to every appointment.    Thank you for choosing [facility]

## 2024-07-03 ENCOUNTER — Ambulatory Visit: Payer: Self-pay | Admitting: Cardiology

## 2024-07-06 NOTE — Progress Notes (Signed)
Remote ICD Transmission.

## 2024-07-17 NOTE — Consults
 Adult Audiologic Evaluation      Name: Kerry Baxter  Date of Birth: 08/07/1956  Date of Evaluation: 07/18/2024    Kerry Baxter is a 68 y.o. who was seen for an adult audiologic evaluation.      Subjective     History  Hearing loss:     Tinnitus:  Yes-constant tinnitus in the left ear  Dizziness:  No  History of noise exposure:  No  Aural Fullness:  No  Pain:  None reported  History of exposure to ototoxic medications:   No  History of ear disease:  No  ENT medical management:  No  History of ear surgery:  No      Objective      Otoscopy    Right ear: Clear ear canal    Left ear: Clear ear canal    Tympanometry    Right ear: Type A, Normal ear canal volume, pressure, and compliance     Left ear:   Type A, Normal ear canal volume, pressure, and compliance     Speech reception threshold (SRT) obtained by repeating words.    Right ear: 15 dB HL    Left ear:  15 dB HL    Pure tone audiometry  Right ear:  Normal hearing from [phone] Hz, sloping to moderately-severe sensorineural hearing loss at 8000 Hz   Left ear:   Normal hearing from [phone] Hz, sloping to mild to moderate sensorineural hearing loss from 6000-8000 Hz    Word recognition completed with NU-6 by difficulty  (recorded)    Right ear: Excellent  at an adequate intensity (100% at 80 dB HL with 50 dB HL masking in contralateral ear)    Left ear:  Excellent  at an adequate intensity (100% at 80 dB HL with 50 dB HL masking in contralateral ear)        Assessment & Counseling      High frequency sensorineural hearing loss in both ears   Hearing is adequate for typical communication.    Counseling   Results reviewed with patient.  Follow up with ENT       Plan & Recommendations      Follow up with ENT/otologist.   Repeat hearing evaluation if changes in hearing are noted   A copy of this report is available via [email or web address]  or ''MyChart'' mobile app.       Vertell Calandra, AuD, 07/18/2024   Senior Audiologist       Education:  Topic: Print production planner  Person Taught: patient  Barrier: None  Needs: Knows well  Teaching Method: Verbal instruction    Outcome: Able to repeat information              For further information, please see Audiological Assessment (located under the Notes tab of Chart Review for [facility] providers).

## 2024-07-18 ENCOUNTER — Ambulatory Visit: Payer: PRIVATE HEALTH INSURANCE

## 2024-07-18 DIAGNOSIS — H9312 Tinnitus, left ear: Secondary | ICD-10-CM

## 2024-07-18 DIAGNOSIS — H903 Sensorineural hearing loss, bilateral: Principal | ICD-10-CM

## 2024-07-18 NOTE — Patient Instructions
 A note from your audiologist:     Thank you for choosing [facility] for your care. It was a pleasure seeing you in clinic today.      Recommendations:  Follow up with ENT/otologist.   Repeat hearing evaluation if changes in hearing are noted   A copy of this report is available via [email or web address]  or ''MyChart'' mobile app.        Please call the office at [phone] if you need to return sooner for any reason.       -------------------------------------------------------------------------------------------------------------------------------------------------------------------------------------------------------------------------------------------         Frequently asked questions:           1. How do I get my results from the visit?     If you sign up for [facility] online ([email or web address]), we will release the report online with comments once your results are available, usually within 1 week.   If you are not signed up for [facility] when your report becomes available, a copy of the audiogram/report can be obtained by contacting Medical Records:  Telephone: [phone]   In-person during the hours of 8:00 AM-4:30 PM, Monday-Friday  [facility]- [address], [address] [place], [place] [address]  [facility]- [address], [place] [address]  Please make sure your address and phone number are up-to-date in our system when you check in. We use these to contact you about important health information, including audiology reports.        2. How do I access my Audiology reports via MyChart?     Accessing Audiology notes online:    The report is viewable in [facility] ''MyChart'' under clinical notes    1. Hover your mouse over the ''Visits'' icon.   2. Click on the ''Appointments and Visits'' link.   3. Click on the desired visit.   4. Click on the ''Clinical Notes'' tab located below the ''Appointment Details'' header.   5. Listed are the types of Ambulatory Notes you may see:   Progress Notes   Consult   Procedure   H&P   Goals of Care   Interdisciplinary Notes     **All of Audiology visit notes (Reports) are considered ''Consult Notes''.     Please call [phone] for additional help if you still cannot see notes.     A copy of the audiogram/reports can be obtained by contacting Medical Records:    Phone: [phone] [facility] Medical Records    In person during the hours of 8:00 AM-4:30 PM, Monday-Friday  [facility]- [address] [place], [place] [address]  [facility]- [address], [address], [place] [address]      Donal Frohlich acceder a sus informes de Audiolog?a en l?nea:      El informe se puede ver en [facility] ''MyChart''. ?Se encuentra bajo notas cl?nicas.      Para acceder al informe:   1.  Pose el cursor sobre el ?icono de ?Visits.? (consultas)  2.  Presione el enlace de ?Appointments and Visits? (citas y consultas)  3.  Haga clic en la consulta deseada  4.  Haga clic en la pesta?a ''Clinical notes? (notas cl?nicas), situado debajo del rubro ''Appontment  Details'' (detalles de la cita)  5.  A continuaci?n, se enumeran los tipos de notas ambulatorias que podr? ver:   Progress Notes (notas evolutivas)  Consult (interconsulta)   Procedure (procedimiento)    H&P (historial m?dico y un examen f?sico)   Goals  of Care (metas de tratamiento)   Interdisciplinary Notes (notas interdisciplinarias)    **Todas las notas de Youth worker en Audiolog?a (Reports) se consideran ?Consult notes? (notas de interconsulta)     Por favor llame al [phone] para obtener ayuda adicional si todav?a no puede ver las notas.        Una copia del audiograma/informes se pueden obtener al comunicarse con el archivo de historia cl?nica     Llame por Tel?fono al: [phone], [facility] Medical Records (archivo de historia cl?nica en [facility])     Atenci?n disponible en persona durante las horas entre 8:00 AM-4:30 PM, de lunes a viernes     [facility]- [address] [place], [place] [address]     [facility]- [address], [place] [address]           3. How can I reach my audiologist after the visit?     For non-emergency contact, you may reach your audiologist two ways:     Online: send non-urgent medical questions through the [facility] website ([email or web address]). These are usually returned within 2 business days.  Call the [facility] at [phone] during business hours to leave a message (or schedule a return appointment).

## 2024-07-20 ENCOUNTER — Encounter: Payer: Self-pay | Admitting: Student

## 2024-07-20 ENCOUNTER — Ambulatory Visit: Attending: Student | Admitting: Cardiology

## 2024-07-20 VITALS — BP 92/74 | HR 78 | Ht 63.0 in | Wt 230.6 lb

## 2024-07-20 DIAGNOSIS — Z95810 Presence of automatic (implantable) cardiac defibrillator: Secondary | ICD-10-CM | POA: Diagnosis not present

## 2024-07-20 DIAGNOSIS — Z951 Presence of aortocoronary bypass graft: Secondary | ICD-10-CM | POA: Diagnosis not present

## 2024-07-20 DIAGNOSIS — I5022 Chronic systolic (congestive) heart failure: Secondary | ICD-10-CM | POA: Diagnosis not present

## 2024-07-20 LAB — CUP PACEART INCLINIC DEVICE CHECK
Battery Remaining Longevity: 57 mo
Brady Statistic RA Percent Paced: 0.19 %
Brady Statistic RV Percent Paced: 0.18 %
Date Time Interrogation Session: 20251003115210
HighPow Impedance: 84.375
Implantable Lead Connection Status: 753985
Implantable Lead Connection Status: 753985
Implantable Lead Implant Date: 20201007
Implantable Lead Implant Date: 20201007
Implantable Lead Location: 753859
Implantable Lead Location: 753860
Implantable Lead Model: 7122
Implantable Pulse Generator Implant Date: 20201007
Lead Channel Impedance Value: 475 Ohm
Lead Channel Impedance Value: 762.5 Ohm
Lead Channel Pacing Threshold Amplitude: 0.75 V
Lead Channel Pacing Threshold Amplitude: 0.75 V
Lead Channel Pacing Threshold Amplitude: 0.75 V
Lead Channel Pacing Threshold Amplitude: 0.75 V
Lead Channel Pacing Threshold Pulse Width: 0.5 ms
Lead Channel Pacing Threshold Pulse Width: 0.5 ms
Lead Channel Pacing Threshold Pulse Width: 0.5 ms
Lead Channel Pacing Threshold Pulse Width: 0.5 ms
Lead Channel Sensing Intrinsic Amplitude: 12 mV
Lead Channel Sensing Intrinsic Amplitude: 4 mV
Lead Channel Setting Pacing Amplitude: 2 V
Lead Channel Setting Pacing Amplitude: 2.5 V
Lead Channel Setting Pacing Pulse Width: 0.5 ms
Lead Channel Setting Sensing Sensitivity: 0.5 mV
Pulse Gen Serial Number: 9901215
Zone Setting Status: 755011

## 2024-07-20 NOTE — Progress Notes (Signed)
  Electrophysiology Office Note:   ID:  Jennifer [name], Jennifer [name] 01-23-1956, MRN [id number]  Primary Cardiologist: Toribio Fuel, MD Electrophysiologist: Soyla Gladis Norton, MD      History of Present Illness:   Jennifer [name] is a 68 y.o. female with h/o DVT/PE, fibromyalgia, systolic heart failure, CAD status post CABG x2 on 09/22/2018, status post Saint Jude ICD implanted 07/25/2019.  Patient is seen today for routine electrophysiology followup.   Since last being seen in our clinic the patient reports doing well overall. Reports that some times are better than others with regards to HF symptoms and exertional tolerance. Overall feels that she is able to do most activities/things that she wishes to do. She continues to follow closely with Dr. Fuel and AHF team.  she denies chest pain, palpitations, dyspnea, PND, orthopnea, nausea, vomiting, dizziness, syncope, edema, weight gain, or early satiety.   Review of systems complete and found to be negative unless listed in HPI.   EP Information / Studies Reviewed:    EKG is not ordered today. EKG from 03/30/24 reviewed which showed sinus rhythm with HR 61bpm.       ICD Interrogation-  reviewed in detail today,  See PACEART report.  Arrhythmia/Device History Abbott Dual Chamber ICD implanted 07/2019 for CHF   Physical Exam:   VS:  BP 92/74   Pulse 78   Ht 5' 3 (1.6 m)   Wt 230 lb 9.6 oz (104.6 kg)   SpO2 95%   BMI 40.85 kg/m    Wt Readings from Last 3 Encounters:  07/20/24 230 lb 9.6 oz (104.6 kg)  07/02/24 231 lb (104.8 kg)  05/16/24 239 lb (108.4 kg)     GEN: No acute distress  NECK: No JVD; No carotid bruits CARDIAC: Regular rate and rhythm with occasional ectopy, no murmurs, rubs, gallops RESPIRATORY:  Clear to auscultation without rales, wheezing or rhonchi  ABDOMEN: Soft, non-tender, non-distended EXTREMITIES:  No edema; No deformity   ASSESSMENT AND PLAN:    Chronic systolic CHF  s/p Abbott dual chamber ICD   euvolemic today Stable on an appropriate medical regimen Normal ICD function. AT/AF <1%. Continue Eliquis  2.5mg  BID.  See Pace Art report No changes today NYHA II-III symptoms (KCCQ-12 completed). On excellent GDMT per AHF team. Discussed CCM and barostim as potential means of stabilizing HF symptoms should they progress.   Coronary artery disease No ischemic symptoms reported by patient.   Disposition:   Follow up with Dr. Norton in 12 months   Signed, Artist Pouch, PA-C

## 2024-07-20 NOTE — Patient Instructions (Signed)
 Medication Instructions:  No medication changes today. *If you need a refill on your cardiac medications before your next appointment, please call your pharmacy*  Lab Work: No labwork ordered today. If you have labs (blood work) drawn today and your tests are completely normal, you will receive your results only by: MyChart Message (if you have MyChart) OR A paper copy in the mail If you have any lab test that is abnormal or we need to change your treatment, we will call you to review the results.  Testing/Procedures: No testing ordered today  Follow-Up: At [facility], you and your health needs are our priority.  As part of our continuing mission to provide you with exceptional heart care, our providers are all part of one team.  This team includes your primary Cardiologist (physician) and Advanced Practice Providers or APPs (Physician Assistants and Nurse Practitioners) who all work together to provide you with the care you need, when you need it.  Devices We Discussed [email or web address] - Wire up to the neck [email or web address] - Wires down into the heart   Your next appointment:   12 month(s)  Provider:   You may see Will Gladis Norton, MD or one of the following Advanced Practice Providers on your designated Care Team:   Charlies Arthur, PA-C Etheridge Geil Andy Tymira Horkey, PA-C Suzann Riddle, NP Daphne Barrack, NP    We recommend signing up for the patient portal called MyChart.  Sign up information is provided on this After Visit Summary.  MyChart is used to connect with patients for Virtual Visits (Telemedicine).  Patients are able to view lab/test results, encounter notes, upcoming appointments, etc.  Non-urgent messages can be sent to your provider as well.   To learn more about what you can do with MyChart, go to [email or web address].

## 2024-07-23 ENCOUNTER — Ambulatory Visit: Payer: Self-pay | Admitting: Cardiology

## 2024-07-26 ENCOUNTER — Telehealth: Payer: Self-pay | Admitting: Cardiology

## 2024-07-26 NOTE — Telephone Encounter (Signed)
 Spoke w/ patient - she is scheduled with Jodie next Thursday, 10/16!

## 2024-07-26 NOTE — Telephone Encounter (Signed)
 Pt is calling back and wanting to have the Impulse Device procedure. Please advise

## 2024-08-01 NOTE — Progress Notes (Addendum)
 Electrophysiology Office Note:   ID:  Jennifer [name], Jennifer [name] 1956-06-18, MRN [id number]  Primary Cardiologist: Toribio Fuel, MD Electrophysiologist: Soyla Gladis Norton, MD      History of Present Illness:   Jennifer [name] is a 68 y.o. female with h/o DVT/PE, fibromyalgia, systolic heart failure, CAD status post CABG x2 on 09/22/2018, status post Saint Jude ICD implanted 07/25/2019 seen today for routine electrophysiology followup.   Seen 10/3 and discussed Barostim and CCM. Pt called back and would like to proceed with CCM.   Since last being seen in our clinic the patient reports doing OK. Continues to have SOB with exertion and fatigue. See KCCQ 12 and prior discussion for further. No chest pain, syncope, or edema.   Review of systems complete and found to be negative unless listed in HPI.   EP Information / Studies Reviewed:    EKG is ordered today. Personal review as below.  EKG Interpretation Date/Time:  Thursday August 02 2024 08:53:15 EDT Ventricular Rate:  76 PR Interval:  192 QRS Duration:  90 QT Interval:  396 QTC Calculation: 445 R Axis:   70  Text Interpretation: Normal sinus rhythm Low voltage QRS When compared with ECG of 30-Mar-2024 09:06, Nonspecific T wave abnormality no longer evident in Lateral leads Confirmed by Lesia Sharper [id number]) on 08/08/2024 8:59:59 AM    ICD Interrogation-  Not performed today,  See PACEART report from 10/3  Arrhythmia/Device History Abbott Dual Chamber ICD implanted 07/2019 for CHF   Physical Exam:   VS:  BP 99/62   Pulse 70   Ht 5' 3 (1.6 m)   Wt 226 lb 3.2 oz (102.6 kg)   SpO2 94%   BMI 40.07 kg/m    Wt Readings from Last 3 Encounters:  07/20/24 230 lb 9.6 oz (104.6 kg)  07/02/24 231 lb (104.8 kg)  05/16/24 239 lb (108.4 kg)     GEN: No acute distress  NECK: No JVD; No carotid bruits CARDIAC: Regular rate and rhythm, no murmurs, rubs, gallops RESPIRATORY:  Clear to auscultation without rales, wheezing or rhonchi   ABDOMEN: Soft, non-tender, non-distended EXTREMITIES:  No edema; No deformity   ASSESSMENT AND PLAN:    Chronic systolic CHF  s/p Abbott dual chamber ICD  euvolemic today Stable on an appropriate medical regimen Normal ICD function 10/3 Not checked today.   See KCCQ-12 results from 10/3. Pt has continued NYHA III symptoms despite GDMT  CAD s/p CABG No s/s of ischemia.      Jennifer [name] heart failure has failed to improve despite titration of guideline directed medication such that he qualifies for the Cardiac Contractility Modulation (CCM) device. The following information from the patient's medical record supports the medical necessity of this procedure for my patient, consistent with the FDA on-label indication for CCM:  ? LVEF of 25-30%  confirmed by Echo on 04/30/2024   ?Symptomatic despite medication management of: diuretic, beta blocker, ACEi/ARB/ARNi, Aldosterone inhibitor, and SGLT2 inhibitor as evidenced by symptoms below.   ? This patients signs and symptoms of heart failure include dyspnea with mild to moderate exertion, fatigue, and weakness   ? NYHA Congestive Heart Failure Classification: III  ? Recent hospitalization for Heart Failure on (not applicable for this patient)   This patient is NOT indicated for cardiac resynchronization therapy because QRS = 90 ms by EKG on 08/02/24     Disposition:   Follow up with EP Team as usual post procedure. 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  Signed, Ozell Prentice Passey, PA-C

## 2024-08-02 ENCOUNTER — Encounter: Payer: Self-pay | Admitting: Student

## 2024-08-02 ENCOUNTER — Ambulatory Visit: Attending: Student | Admitting: Student

## 2024-08-02 VITALS — BP 99/62 | HR 70 | Ht 63.0 in | Wt 226.2 lb

## 2024-08-02 DIAGNOSIS — I5022 Chronic systolic (congestive) heart failure: Secondary | ICD-10-CM

## 2024-08-02 DIAGNOSIS — Z951 Presence of aortocoronary bypass graft: Secondary | ICD-10-CM

## 2024-08-02 NOTE — Patient Instructions (Signed)
 Medication Instructions:  Your physician recommends that you continue on your current medications as directed. Please refer to the Current Medication list given to you today.  *If you need a refill on your cardiac medications before your next appointment, please call your pharmacy*  Lab Work: None ordered If you have labs (blood work) drawn today and your tests are completely normal, you will receive your results only by: MyChart Message (if you have MyChart) OR A paper copy in the mail If you have any lab test that is abnormal or we need to change your treatment, we will call you to review the results.  Follow-Up: At [facility], you and your health needs are our priority.  As part of our continuing mission to provide you with exceptional heart care, our providers are all part of one team.  This team includes your primary Cardiologist (physician) and Advanced Practice Providers or APPs (Physician Assistants and Nurse Practitioners) who all work together to provide you with the care you need, when you need it.  Your next appointment:   3 month(s)  Provider:   Ozell Jodie Passey, PA-C

## 2024-08-23 ENCOUNTER — Other Ambulatory Visit: Payer: PRIVATE HEALTH INSURANCE

## 2024-09-03 ENCOUNTER — Other Ambulatory Visit (HOSPITAL_COMMUNITY): Payer: Self-pay

## 2024-09-19 ENCOUNTER — Other Ambulatory Visit: Payer: Self-pay | Admitting: *Deleted

## 2024-09-19 DIAGNOSIS — I5022 Chronic systolic (congestive) heart failure: Secondary | ICD-10-CM

## 2024-09-19 DIAGNOSIS — Z01812 Encounter for preprocedural laboratory examination: Secondary | ICD-10-CM

## 2024-09-21 LAB — CBC

## 2024-09-22 LAB — CBC
Hematocrit: 45.4 % (ref 34.0–46.6)
Hemoglobin: 15 g/dL (ref 11.1–15.9)
MCH: 32.9 pg (ref 26.6–33.0)
MCHC: 33 g/dL (ref 31.5–35.7)
MCV: 100 fL — AB (ref 79–97)
Platelets: 206 x10E3/uL (ref 150–450)
RBC: 4.56 x10E6/uL (ref 3.77–5.28)
RDW: 11.8 % (ref 11.7–15.4)
WBC: 6.3 x10E3/uL (ref 3.4–10.8)

## 2024-09-22 LAB — BASIC METABOLIC PANEL WITH GFR
BUN/Creatinine Ratio: 16 (ref 12–28)
BUN: 14 mg/dL (ref 8–27)
CO2: 23 mmol/L (ref 20–29)
Calcium: 9.4 mg/dL (ref 8.7–10.3)
Chloride: 108 mmol/L — ABNORMAL HIGH (ref 96–106)
Creatinine, Ser: 0.88 mg/dL (ref 0.57–1.00)
Glucose: 76 mg/dL (ref 70–99)
Potassium: 3.7 mmol/L (ref 3.5–5.2)
Sodium: 144 mmol/L (ref 134–144)
eGFR: 72 mL/min/1.73 (ref >59)

## 2024-09-23 NOTE — Pre-Procedure Instructions (Signed)
 Instructed patient on the following items: Arrival time 0930 Nothing to eat or drink after midnight No meds AM of procedure Responsible person to drive you home and stay with you for 24 hrs Wash with special soap night before and morning of procedure If on anti-coagulant drug instructions Eliquis - last dose was Friday 12/5.  Wegovy -last dose was 12/4

## 2024-09-24 ENCOUNTER — Ambulatory Visit (HOSPITAL_COMMUNITY)
Admission: RE | Disposition: A | Discharge status: Home / Self Care | Payer: Self-pay | Source: Home / Self Care | Attending: Cardiology

## 2024-09-24 ENCOUNTER — Ambulatory Visit (HOSPITAL_COMMUNITY)

## 2024-09-24 ENCOUNTER — Other Ambulatory Visit: Payer: Self-pay

## 2024-09-24 ENCOUNTER — Ambulatory Visit (HOSPITAL_COMMUNITY)
Admission: RE | Admit: 2024-09-24 | Discharge: 2024-09-24 | Disposition: A | Discharge status: Home / Self Care | Attending: Cardiology | Admitting: Cardiology

## 2024-09-24 DIAGNOSIS — I5022 Chronic systolic (congestive) heart failure: Secondary | ICD-10-CM

## 2024-09-24 HISTORY — PX: CCM GENERATOR AND A/V LEAD INSERTION: EP1261

## 2024-09-24 SURGERY — CCM GENERATOR AND A/V LEAD INSERTION
Anesthesia: LOCAL

## 2024-09-24 MED ORDER — MIDAZOLAM HCL 5 MG/5ML IJ SOLN
INTRAMUSCULAR | Status: DC | PRN
  Administered 2024-09-24: 1 mg via INTRAVENOUS

## 2024-09-24 MED ORDER — FENTANYL CITRATE (PF) 100 MCG/2ML IJ SOLN
INTRAMUSCULAR | Status: DC | PRN
  Administered 2024-09-24: 25 ug via INTRAVENOUS

## 2024-09-24 MED ORDER — LIDOCAINE HCL (PF) 1 % IJ SOLN
INTRAMUSCULAR | Status: DC | PRN
  Administered 2024-09-24: 60 mL

## 2024-09-24 MED ORDER — HEPARIN (PORCINE) IN NACL 1000-0.9 UT/500ML-% IV SOLN
INTRAVENOUS | Status: DC | PRN
  Administered 2024-09-24: 500 mL

## 2024-09-24 MED ORDER — SODIUM CHLORIDE 0.9 % IV SOLN
INTRAVENOUS | Status: DC
  Filled 2024-09-24: qty 2

## 2024-09-24 MED ORDER — LIDOCAINE HCL (PF) 1 % IJ SOLN
INTRAMUSCULAR | Status: AC
  Filled 2024-09-24: qty 60

## 2024-09-24 MED ORDER — SODIUM CHLORIDE 0.9 % IV SOLN
80.0000 mg | INTRAVENOUS | Status: AC
  Administered 2024-09-24: 80 mg

## 2024-09-24 MED ORDER — CEFAZOLIN SODIUM-DEXTROSE 2-4 GM/100ML-% IV SOLN: 2.0000 g | INTRAVENOUS | Status: AC

## 2024-09-24 MED ORDER — MIDAZOLAM HCL 2 MG/2ML IJ SOLN
INTRAMUSCULAR | Status: AC
  Filled 2024-09-24: qty 2

## 2024-09-24 MED ORDER — FENTANYL CITRATE (PF) 100 MCG/2ML IJ SOLN
INTRAMUSCULAR | Status: AC
  Filled 2024-09-24: qty 2

## 2024-09-24 MED ORDER — SODIUM CHLORIDE 0.9 % IV SOLN: INTRAVENOUS | Status: DC

## 2024-09-24 MED ORDER — ONDANSETRON HCL 4 MG/2ML IJ SOLN: 4.0000 mg | Freq: Four times a day (QID) | INTRAMUSCULAR | Status: DC | PRN

## 2024-09-24 MED ORDER — CEFAZOLIN SODIUM-DEXTROSE 2-4 GM/100ML-% IV SOLN
INTRAVENOUS | Status: AC
  Administered 2024-09-24: 2 g via INTRAVENOUS
  Filled 2024-09-24: qty 100

## 2024-09-24 MED ORDER — ACETAMINOPHEN 325 MG PO TABS: 325.0000 mg | ORAL_TABLET | ORAL | Status: DC | PRN

## 2024-09-24 MED ORDER — CHLORHEXIDINE GLUCONATE 4 % EX SOLN
60.0000 mL | Freq: Once | CUTANEOUS | Status: DC
  Filled 2024-09-24: qty 60

## 2024-09-24 MED ORDER — CHLORHEXIDINE GLUCONATE 4 % EX SOLN: 60.0000 mL | Freq: Once | CUTANEOUS | Status: DC

## 2024-09-24 MED ORDER — POVIDONE-IODINE 10 % EX SWAB
2.0000 | Freq: Once | CUTANEOUS | Status: AC
  Administered 2024-09-24: 2 via TOPICAL

## 2024-09-24 SURGICAL SUPPLY — 8 items
CABLE SURGICAL S-101-97-12 (CABLE) ×1 IMPLANT
GENERATOR PLS OPTMZR SMRT MINI (Generator) IMPLANT
LEAD CAPSURE NOVUS 5076-52CM (Lead) IMPLANT
PAD DEFIB RADIO PHYSIO CONN (PAD) ×1 IMPLANT
SHEATH 7FR PRELUDE SNAP 13 (SHEATH) IMPLANT
SHEATH PROBE COVER 6X72 (BAG) IMPLANT
TRAY PACEMAKER INSERTION (PACKS) ×1 IMPLANT
WIRE HI TORQ VERSACORE-J 145CM (WIRE) IMPLANT

## 2024-09-24 NOTE — H&P (Signed)
  Electrophysiology Office Note:   ID:  [name], [name] 06-15-1956, MRN [id number]  Primary Cardiologist: Toribio Fuel, MD Electrophysiologist: Soyla Gladis Norton, MD      History of Present Illness:   Jennifer [name] is a 68 y.o. female with h/o DVT/PE, fibromyalgia, systolic heart failure, CAD status post CABG x2 on 09/22/2018, status post Saint Jude ICD implanted 07/25/2019 seen today for routine electrophysiology followup.   Today, denies symptoms of palpitations, chest pain, dyspnea, orthopnea, PND, lower extremity edema, claudication, dizziness, presyncope, syncope, bleeding, or neurologic sequela. The patient is tolerating medications without difficulties. Plan CCM today.   EP Information / Studies Reviewed:    EKG is ordered today. Personal review as below.       ICD Interrogation-  Not performed today,  See PACEART report from 10/3  Arrhythmia/Device History Abbott Dual Chamber ICD implanted 07/2019 for CHF   Physical Exam:   VS:  BP 117/60   Pulse 75   Temp 97.9 F (36.6 C) (Oral)   Resp 17   Ht 5' 2 (1.575 m)   Wt 98.9 kg   SpO2 96%   BMI 39.87 kg/m    Wt Readings from Last 3 Encounters:  09/24/24 98.9 kg  08/02/24 102.6 kg  07/20/24 104.6 kg    GEN: Well nourished, well developed in no acute distress NECK: No JVD; No carotid bruits CARDIAC: Regular rate and rhythm, no murmurs, rubs, gallops RESPIRATORY:  Clear to auscultation without rales, wheezing or rhonchi  ABDOMEN: Soft, non-tender, non-distended EXTREMITIES:  No edema; No deformity    ASSESSMENT AND PLAN:    Chronic systolic CHF  s/p Abbott dual chamber ICD  Jennifer [name] has presented today for surgery, with the diagnosis of CHF.  The various methods of treatment have been discussed with the patient and family. After consideration of risks, benefits and other options for treatment, the patient has consented to  Procedure(s): CCM implant as a surgical intervention .  Risks include but not  limited to bleeding, infection, pneumothorax, perforation, tamponade, vascular damage, renal failure, MI, stroke, death, and lead dislodgement . The patient's history has been reviewed, patient examined, no change in status, stable for surgery.  I have reviewed the patient's chart and labs.  Questions were answered to the patient's satisfaction.    Jennifer Breck Norton, MD 09/24/2024 11:35 AM

## 2024-09-24 NOTE — Discharge Instructions (Signed)
 After Your Cardiac Device   You have a Impulse Dynamics Cardiac Device  ACTIVITY Do not lift your arm above shoulder height for 1 week after your procedure. After 7 days, you may progress as below.  You should remove your sling 24 hours after your procedure, unless otherwise instructed by your provider.     Monday October 01, 2024  Tuesday October 02, 2024 Wednesday October 03, 2024 Thursday October 04, 2024   Do not lift, push, pull, or carry anything over 10 pounds with the affected arm until 6 weeks (Monday November 05, 2024 ) after your procedure.   You may drive AFTER your wound check, unless you have been told otherwise by your provider.   Ask your healthcare provider when you can go back to work   INCISION/Dressing If you are on a blood thinner such as Coumadin, Xarelto , Eliquis , Plavix, or Pradaxa please confirm with your provider when this should be resumed.   If large square, outer bandage is left in place, this can be removed after 24 hours from your procedure. Do not remove steri-strips or glue as below.   If a PRESSURE DRESSING (a bulky dressing that usually goes up over your shoulder) was applied or left in place, please follow instructions given by your provider on when to return to have this removed.   Monitor your cardiac device site for redness, swelling, and drainage. Call the device clinic at [phone] if you experience these symptoms or fever/chills.  If your incision is sealed with Steri-strips or staples, you may shower 7 days after your procedure or when told by your provider. Do not remove the steri-strips or let the shower hit directly on your site. You may wash around your site with soap and water.    If you were discharged in a sling, please do not wear this during the day more than 48 hours after your surgery unless otherwise instructed. This may increase the risk of stiffness and soreness in your shoulder.   Avoid lotions, ointments, or perfumes  over your incision until it is well-healed.  You may use a hot tub or a pool AFTER your wound check appointment if the incision is completely closed.   DEVICE MANAGEMENT You should receive your ID card for your new device in 4-8 weeks. Keep this card with you at all times once received. Consider wearing a medical alert bracelet or necklace.  Please follow manufacture instructions for keeping your device charged carefully. This should be performed every 2 weeks at the very least.   Your cardiac device may be MRI compatible. This will be discussed at your next office visit/wound check.  You should avoid contact with strong electric or magnetic fields.   Do not use amateur (ham) radio equipment or electric (arc) welding torches. MP3 player headphones with magnets should not be used. Some devices are safe to use if held at least 12 inches (30 cm) from your cardiac device. These include power tools, lawn mowers, and speakers. If you are unsure if something is safe to use, ask your health care provider.  When using your cell phone, hold it to the ear that is on the opposite side from the cardiac device. Do not leave your cell phone in a pocket over the cardiac device.  You may safely use electric blankets, heating pads, computers, and microwave ovens.  Call the office right away if: You have chest pain. You feel more short of breath than you have felt before. You feel more  light-headed than you have felt before. Your incision starts to open up.  This information is not intended to replace advice given to you by your health care provider. Make sure you discuss any questions you have with your health care provider.

## 2024-09-24 NOTE — Progress Notes (Signed)
 Pt and husband received discharge teaching, teach back performed. IV removed, no complications. Rt chest site is clean dry intact. No signs of bleeding. Pt escorted out via wheelchair to husband's vehicle.

## 2024-09-25 ENCOUNTER — Encounter (HOSPITAL_COMMUNITY): Payer: Self-pay | Admitting: Cardiology

## 2024-09-26 MED FILL — Midazolam HCl Inj 2 MG/2ML (Base Equivalent): INTRAMUSCULAR | Qty: 1 | Status: AC

## 2024-09-28 ENCOUNTER — Ambulatory Visit: Payer: Self-pay

## 2024-09-28 DIAGNOSIS — I5022 Chronic systolic (congestive) heart failure: Secondary | ICD-10-CM | POA: Diagnosis not present

## 2024-09-29 LAB — CUP PACEART REMOTE DEVICE CHECK
Battery Remaining Longevity: 51 mo
Battery Remaining Percentage: 53 %
Battery Voltage: 2.95 V
Brady Statistic AP VP Percent: 1 %
Brady Statistic AP VS Percent: 1.4 %
Brady Statistic AS VP Percent: 1 %
Brady Statistic AS VS Percent: 96 %
Brady Statistic RA Percent Paced: 1 %
Brady Statistic RV Percent Paced: 1 %
Date Time Interrogation Session: 20251212020017
HighPow Impedance: 75 Ohm
HighPow Impedance: 75 Ohm
Implantable Lead Connection Status: 753985
Implantable Lead Connection Status: 753985
Implantable Lead Implant Date: 20201007
Implantable Lead Implant Date: 20201007
Implantable Lead Location: 753859
Implantable Lead Location: 753860
Implantable Lead Model: 7122
Implantable Pulse Generator Implant Date: 20201007
Lead Channel Impedance Value: 450 Ohm
Lead Channel Impedance Value: 590 Ohm
Lead Channel Pacing Threshold Amplitude: 0.75 V
Lead Channel Pacing Threshold Amplitude: 0.75 V
Lead Channel Pacing Threshold Pulse Width: 0.5 ms
Lead Channel Pacing Threshold Pulse Width: 0.5 ms
Lead Channel Sensing Intrinsic Amplitude: 12 mV
Lead Channel Sensing Intrinsic Amplitude: 2.5 mV
Lead Channel Setting Pacing Amplitude: 2 V
Lead Channel Setting Pacing Amplitude: 2.5 V
Lead Channel Setting Pacing Pulse Width: 0.5 ms
Lead Channel Setting Sensing Sensitivity: 0.5 mV
Pulse Gen Serial Number: 9901215
Zone Setting Status: 755011

## 2024-10-01 ENCOUNTER — Ambulatory Visit: Payer: Self-pay | Admitting: Cardiology

## 2024-10-04 NOTE — Progress Notes (Signed)
 Remote ICD Transmission

## 2024-10-26 ENCOUNTER — Ambulatory Visit: Admitting: Student

## 2024-10-28 MED ORDER — ATORVASTATIN CALCIUM 10 MG PO TABS
10 mg | ORAL_TABLET | Freq: Every day | ORAL | 3 refills
Start: 2024-10-28 — End: ?

## 2024-10-30 MED ORDER — ATORVASTATIN CALCIUM 10 MG PO TABS
10 mg | ORAL_TABLET | Freq: Every day | ORAL | 0 refills | 90.00000 days | Status: AC
Start: 2024-10-30 — End: ?

## 2024-11-12 ENCOUNTER — Telehealth (HOSPITAL_COMMUNITY): Payer: Self-pay

## 2024-11-12 NOTE — Telephone Encounter (Signed)
 Advanced Heart Failure Patient Advocate Encounter  The patient was renewed for a [employer] grant that will help cover the cost of Carvedilol , Eliquis , Entresto , Farxiga , Spironolactone .  Total amount awarded, $7,500.  Effective: 10/13/2024 - 10/12/2025.  BIN [id number] PCN [id number] [id number] ID [id number]  Pharmacy provided with approval and processing information. Patient informed via MyChart.  Rachel DEL, CPhT Rx Patient Advocate Phone: [phone]

## 2024-11-15 ENCOUNTER — Ambulatory Visit: Admitting: Student

## 2024-11-22 ENCOUNTER — Ambulatory Visit

## 2024-11-22 DIAGNOSIS — I5022 Chronic systolic (congestive) heart failure: Secondary | ICD-10-CM

## 2024-11-22 NOTE — Patient Instructions (Signed)
Patient will follow up as scheduled.

## 2024-11-22 NOTE — Progress Notes (Signed)
 Patient seen in clinic by Selinda Milo for CCM management  Patient scheduled to follow up with A. Tillery on 01/24/25 @ 1040

## 2024-12-28 ENCOUNTER — Ambulatory Visit: Payer: Self-pay

## 2025-01-24 ENCOUNTER — Ambulatory Visit: Admitting: Student

## 2025-03-29 ENCOUNTER — Ambulatory Visit: Payer: Self-pay
